# Patient Record
Sex: Female | Born: 1950 | Race: White | Hispanic: No | Marital: Single | Smoking: Current every day smoker
Health system: Southern US, Community
[De-identification: ages and names within clinical notes are randomized; demographics above are authoritative.]

## PROBLEM LIST (undated history)

## (undated) DIAGNOSIS — R9439 Abnormal result of other cardiovascular function study: Secondary | ICD-10-CM

## (undated) DIAGNOSIS — F419 Anxiety disorder, unspecified: Secondary | ICD-10-CM

## (undated) DIAGNOSIS — F329 Major depressive disorder, single episode, unspecified: Secondary | ICD-10-CM

## (undated) DIAGNOSIS — I82409 Acute embolism and thrombosis of unspecified deep veins of unspecified lower extremity: Secondary | ICD-10-CM

## (undated) DIAGNOSIS — K219 Gastro-esophageal reflux disease without esophagitis: Secondary | ICD-10-CM

## (undated) DIAGNOSIS — F32A Depression, unspecified: Secondary | ICD-10-CM

## (undated) DIAGNOSIS — M1990 Unspecified osteoarthritis, unspecified site: Secondary | ICD-10-CM

## (undated) DIAGNOSIS — T7840XA Allergy, unspecified, initial encounter: Secondary | ICD-10-CM

## (undated) DIAGNOSIS — L308 Other specified dermatitis: Secondary | ICD-10-CM

## (undated) DIAGNOSIS — I10 Essential (primary) hypertension: Secondary | ICD-10-CM

## (undated) DIAGNOSIS — D689 Coagulation defect, unspecified: Secondary | ICD-10-CM

## (undated) DIAGNOSIS — E785 Hyperlipidemia, unspecified: Secondary | ICD-10-CM

## (undated) HISTORY — DX: Coagulation defect, unspecified: D68.9

## (undated) HISTORY — DX: Essential (primary) hypertension: I10

## (undated) HISTORY — DX: Unspecified osteoarthritis, unspecified site: M19.90

## (undated) HISTORY — PX: TONSILLECTOMY: SUR1361

## (undated) HISTORY — DX: Major depressive disorder, single episode, unspecified: F32.9

## (undated) HISTORY — DX: Abnormal result of other cardiovascular function study: R94.39

## (undated) HISTORY — DX: Hyperlipidemia, unspecified: E78.5

## (undated) HISTORY — DX: Depression, unspecified: F32.A

## (undated) HISTORY — DX: Allergy, unspecified, initial encounter: T78.40XA

## (undated) HISTORY — DX: Anxiety disorder, unspecified: F41.9

## (undated) HISTORY — DX: Gastro-esophageal reflux disease without esophagitis: K21.9

## (undated) HISTORY — PX: TUBAL LIGATION: SHX77

## (undated) HISTORY — PX: NOSE SURGERY: SHX723

---

## 1998-08-21 ENCOUNTER — Other Ambulatory Visit: Admission: RE | Admit: 1998-08-21 | Discharge: 1998-08-21 | Payer: Self-pay | Admitting: Obstetrics and Gynecology

## 1998-09-17 ENCOUNTER — Other Ambulatory Visit: Admission: RE | Admit: 1998-09-17 | Discharge: 1998-09-17 | Payer: Self-pay | Admitting: Radiology

## 1999-08-03 ENCOUNTER — Encounter: Payer: Self-pay | Admitting: Orthopedic Surgery

## 1999-08-03 ENCOUNTER — Encounter: Admission: RE | Admit: 1999-08-03 | Discharge: 1999-08-03 | Payer: Self-pay | Admitting: Orthopedic Surgery

## 1999-11-30 ENCOUNTER — Other Ambulatory Visit: Admission: RE | Admit: 1999-11-30 | Discharge: 1999-11-30 | Payer: Self-pay | Admitting: Obstetrics and Gynecology

## 2000-01-18 ENCOUNTER — Encounter: Payer: Self-pay | Admitting: Family Medicine

## 2000-01-18 ENCOUNTER — Encounter: Admission: RE | Admit: 2000-01-18 | Discharge: 2000-01-18 | Payer: Self-pay | Admitting: Family Medicine

## 2000-02-03 ENCOUNTER — Encounter: Payer: Self-pay | Admitting: Family Medicine

## 2000-02-03 ENCOUNTER — Ambulatory Visit (HOSPITAL_COMMUNITY): Admission: RE | Admit: 2000-02-03 | Discharge: 2000-02-03 | Payer: Self-pay | Admitting: Family Medicine

## 2001-03-06 ENCOUNTER — Other Ambulatory Visit: Admission: RE | Admit: 2001-03-06 | Discharge: 2001-03-06 | Payer: Self-pay | Admitting: Obstetrics and Gynecology

## 2002-04-03 ENCOUNTER — Other Ambulatory Visit: Admission: RE | Admit: 2002-04-03 | Discharge: 2002-04-03 | Payer: Self-pay | Admitting: Obstetrics and Gynecology

## 2003-05-03 ENCOUNTER — Ambulatory Visit (HOSPITAL_COMMUNITY): Admission: RE | Admit: 2003-05-03 | Discharge: 2003-05-03 | Payer: Self-pay | Admitting: Obstetrics and Gynecology

## 2003-06-10 ENCOUNTER — Other Ambulatory Visit: Admission: RE | Admit: 2003-06-10 | Discharge: 2003-06-10 | Payer: Self-pay | Admitting: Obstetrics and Gynecology

## 2005-11-02 ENCOUNTER — Ambulatory Visit: Payer: Self-pay | Admitting: Internal Medicine

## 2006-11-30 ENCOUNTER — Emergency Department (HOSPITAL_COMMUNITY): Admission: EM | Admit: 2006-11-30 | Discharge: 2006-11-30 | Payer: Self-pay | Admitting: Family Medicine

## 2006-12-30 ENCOUNTER — Encounter: Admission: RE | Admit: 2006-12-30 | Discharge: 2006-12-30 | Payer: Self-pay | Admitting: Otolaryngology

## 2008-04-17 ENCOUNTER — Ambulatory Visit: Payer: Self-pay | Admitting: Family Medicine

## 2008-04-17 DIAGNOSIS — J45909 Unspecified asthma, uncomplicated: Secondary | ICD-10-CM | POA: Insufficient documentation

## 2008-04-17 DIAGNOSIS — F329 Major depressive disorder, single episode, unspecified: Secondary | ICD-10-CM | POA: Insufficient documentation

## 2008-04-17 DIAGNOSIS — M4120 Other idiopathic scoliosis, site unspecified: Secondary | ICD-10-CM | POA: Insufficient documentation

## 2008-04-17 DIAGNOSIS — L259 Unspecified contact dermatitis, unspecified cause: Secondary | ICD-10-CM

## 2008-04-17 DIAGNOSIS — E7800 Pure hypercholesterolemia, unspecified: Secondary | ICD-10-CM

## 2008-04-17 DIAGNOSIS — F419 Anxiety disorder, unspecified: Secondary | ICD-10-CM | POA: Insufficient documentation

## 2008-04-17 DIAGNOSIS — Z860100 Personal history of colon polyps, unspecified: Secondary | ICD-10-CM | POA: Insufficient documentation

## 2008-04-19 ENCOUNTER — Encounter (INDEPENDENT_AMBULATORY_CARE_PROVIDER_SITE_OTHER): Payer: Self-pay | Admitting: Family Medicine

## 2008-04-19 LAB — CONVERTED CEMR LAB
ALT: 19 units/L (ref 0–35)
AST: 18 units/L (ref 0–37)
Albumin: 4.5 g/dL (ref 3.5–5.2)
Alkaline Phosphatase: 77 units/L (ref 39–117)
BUN: 17 mg/dL (ref 6–23)
Basophils Absolute: 0 10*3/uL (ref 0.0–0.1)
Basophils Relative: 0 % (ref 0–1)
Calcium: 9.4 mg/dL (ref 8.4–10.5)
Chloride: 107 meq/L (ref 96–112)
Eosinophils Absolute: 0.3 10*3/uL (ref 0.0–0.7)
HDL: 70 mg/dL (ref >39)
LDL Cholesterol: 188 mg/dL — ABNORMAL HIGH (ref 0–99)
MCHC: 32 g/dL (ref 30.0–36.0)
MCV: 95.2 fL (ref 78.0–100.0)
Monocytes Relative: 11 % (ref 3–12)
Neutro Abs: 4.5 10*3/uL (ref 1.7–7.7)
Neutrophils Relative %: 59 % (ref 43–77)
Platelets: 296 10*3/uL (ref 150–400)
Potassium: 4.4 meq/L (ref 3.5–5.3)
RBC: 4.99 M/uL (ref 3.87–5.11)
Sodium: 143 meq/L (ref 135–145)
TSH: 2.373 microintl units/mL (ref 0.350–4.50)
Total Protein: 7 g/dL (ref 6.0–8.3)

## 2008-05-06 ENCOUNTER — Telehealth (INDEPENDENT_AMBULATORY_CARE_PROVIDER_SITE_OTHER): Payer: Self-pay | Admitting: Family Medicine

## 2008-05-06 ENCOUNTER — Ambulatory Visit: Payer: Self-pay | Admitting: Family Medicine

## 2008-05-07 ENCOUNTER — Encounter (INDEPENDENT_AMBULATORY_CARE_PROVIDER_SITE_OTHER): Payer: Self-pay | Admitting: Family Medicine

## 2008-05-07 ENCOUNTER — Ambulatory Visit: Payer: Self-pay | Admitting: *Deleted

## 2008-05-17 ENCOUNTER — Ambulatory Visit (HOSPITAL_COMMUNITY): Admission: RE | Admit: 2008-05-17 | Discharge: 2008-05-17 | Payer: Self-pay | Admitting: Family Medicine

## 2008-10-01 ENCOUNTER — Ambulatory Visit: Payer: Self-pay | Admitting: Family Medicine

## 2008-10-01 ENCOUNTER — Encounter (INDEPENDENT_AMBULATORY_CARE_PROVIDER_SITE_OTHER): Payer: Self-pay | Admitting: Family Medicine

## 2008-10-01 DIAGNOSIS — N840 Polyp of corpus uteri: Secondary | ICD-10-CM | POA: Insufficient documentation

## 2008-10-01 LAB — CONVERTED CEMR LAB
Blood in Urine, dipstick: NEGATIVE
Glucose, Urine, Semiquant: NEGATIVE
Ketones, urine, test strip: NEGATIVE
Nitrite: NEGATIVE
pH: 7

## 2008-10-10 ENCOUNTER — Encounter (INDEPENDENT_AMBULATORY_CARE_PROVIDER_SITE_OTHER): Payer: Self-pay | Admitting: Family Medicine

## 2008-11-21 ENCOUNTER — Ambulatory Visit (HOSPITAL_COMMUNITY): Admission: RE | Admit: 2008-11-21 | Discharge: 2008-11-21 | Payer: Self-pay | Admitting: Gastroenterology

## 2009-01-03 ENCOUNTER — Ambulatory Visit: Payer: Self-pay | Admitting: Physician Assistant

## 2009-02-10 ENCOUNTER — Ambulatory Visit: Payer: Self-pay | Admitting: Physician Assistant

## 2009-02-10 DIAGNOSIS — R7303 Prediabetes: Secondary | ICD-10-CM

## 2009-02-10 DIAGNOSIS — R110 Nausea: Secondary | ICD-10-CM

## 2009-02-10 LAB — CONVERTED CEMR LAB
Blood Glucose, Fingerstick: 145
Hgb A1c MFr Bld: 6.1 %

## 2009-02-18 ENCOUNTER — Ambulatory Visit: Payer: Self-pay | Admitting: Physician Assistant

## 2009-02-21 ENCOUNTER — Ambulatory Visit: Payer: Self-pay | Admitting: Obstetrics & Gynecology

## 2009-02-21 ENCOUNTER — Other Ambulatory Visit: Admission: RE | Admit: 2009-02-21 | Discharge: 2009-02-21 | Payer: Self-pay | Admitting: Obstetrics & Gynecology

## 2009-02-24 LAB — CONVERTED CEMR LAB
ALT: 16 units/L (ref 0–35)
Alkaline Phosphatase: 81 units/L (ref 39–117)
Basophils Absolute: 0 10*3/uL (ref 0.0–0.1)
Basophils Relative: 0 % (ref 0–1)
LDL Cholesterol: 135 mg/dL — ABNORMAL HIGH (ref 0–99)
Lymphocytes Relative: 29 % (ref 12–46)
MCHC: 32.1 g/dL (ref 30.0–36.0)
Microalb, Ur: 0.9 mg/dL (ref 0.00–1.89)
Monocytes Relative: 10 % (ref 3–12)
Neutro Abs: 3.6 10*3/uL (ref 1.7–7.7)
Neutrophils Relative %: 57 % (ref 43–77)
Potassium: 4.5 meq/L (ref 3.5–5.3)
RBC: 4.65 M/uL (ref 3.87–5.11)
RDW: 14.3 % (ref 11.5–15.5)
Sodium: 142 meq/L (ref 135–145)
Total Bilirubin: 0.6 mg/dL (ref 0.3–1.2)
Total Protein: 6.3 g/dL (ref 6.0–8.3)
VLDL: 10 mg/dL (ref 0–40)

## 2009-02-25 ENCOUNTER — Ambulatory Visit: Payer: Self-pay | Admitting: Physician Assistant

## 2009-02-27 ENCOUNTER — Encounter: Payer: Self-pay | Admitting: Physician Assistant

## 2009-03-04 ENCOUNTER — Ambulatory Visit: Payer: Self-pay | Admitting: Physician Assistant

## 2009-03-18 ENCOUNTER — Ambulatory Visit: Payer: Self-pay | Admitting: Physician Assistant

## 2009-04-03 ENCOUNTER — Encounter: Payer: Self-pay | Admitting: Physician Assistant

## 2009-04-03 DIAGNOSIS — N72 Inflammatory disease of cervix uteri: Secondary | ICD-10-CM

## 2009-04-29 ENCOUNTER — Ambulatory Visit: Payer: Self-pay | Admitting: Physician Assistant

## 2009-05-01 ENCOUNTER — Telehealth: Payer: Self-pay | Admitting: Physician Assistant

## 2009-05-19 ENCOUNTER — Ambulatory Visit (HOSPITAL_COMMUNITY): Admission: RE | Admit: 2009-05-19 | Discharge: 2009-05-19 | Payer: Self-pay | Admitting: Internal Medicine

## 2009-05-19 ENCOUNTER — Encounter: Payer: Self-pay | Admitting: Physician Assistant

## 2009-07-07 ENCOUNTER — Ambulatory Visit: Payer: Self-pay | Admitting: Physician Assistant

## 2009-07-21 ENCOUNTER — Ambulatory Visit: Payer: Self-pay | Admitting: Physician Assistant

## 2009-07-28 ENCOUNTER — Ambulatory Visit: Payer: Self-pay | Admitting: Physician Assistant

## 2009-07-28 LAB — CONVERTED CEMR LAB
Blood Glucose, Fingerstick: 92
Cholesterol, target level: 200 mg/dL

## 2009-07-29 ENCOUNTER — Encounter: Payer: Self-pay | Admitting: Physician Assistant

## 2009-07-29 ENCOUNTER — Ambulatory Visit: Payer: Self-pay | Admitting: Internal Medicine

## 2009-07-30 ENCOUNTER — Ambulatory Visit: Payer: Self-pay | Admitting: Internal Medicine

## 2009-07-30 ENCOUNTER — Encounter: Payer: Self-pay | Admitting: Physician Assistant

## 2009-08-05 LAB — CONVERTED CEMR LAB
BUN: 16 mg/dL (ref 6–23)
CO2: 30 meq/L (ref 19–32)
Cholesterol: 223 mg/dL — ABNORMAL HIGH (ref 0–200)
Creatinine, Ser: 0.78 mg/dL (ref 0.40–1.20)
Glucose, Bld: 88 mg/dL (ref 70–99)
Total Bilirubin: 0.4 mg/dL (ref 0.3–1.2)
Total Protein: 6.5 g/dL (ref 6.0–8.3)
Triglycerides: 71 mg/dL (ref <150)
VLDL: 14 mg/dL (ref 0–40)

## 2009-08-12 ENCOUNTER — Ambulatory Visit: Payer: Self-pay | Admitting: Internal Medicine

## 2009-08-12 ENCOUNTER — Encounter: Payer: Self-pay | Admitting: Physician Assistant

## 2009-08-13 ENCOUNTER — Encounter (INDEPENDENT_AMBULATORY_CARE_PROVIDER_SITE_OTHER): Payer: Self-pay | Admitting: Internal Medicine

## 2009-08-15 ENCOUNTER — Ambulatory Visit: Payer: Self-pay | Admitting: Physician Assistant

## 2009-08-15 ENCOUNTER — Telehealth: Payer: Self-pay | Admitting: Physician Assistant

## 2009-08-18 ENCOUNTER — Ambulatory Visit: Payer: Self-pay | Admitting: Physician Assistant

## 2009-09-02 ENCOUNTER — Ambulatory Visit: Payer: Self-pay | Admitting: Internal Medicine

## 2009-09-03 ENCOUNTER — Encounter (INDEPENDENT_AMBULATORY_CARE_PROVIDER_SITE_OTHER): Payer: Self-pay | Admitting: Internal Medicine

## 2009-09-03 LAB — CONVERTED CEMR LAB
AST: 18 units/L (ref 0–37)
BUN: 17 mg/dL (ref 6–23)
Calcium: 9.7 mg/dL (ref 8.4–10.5)
Chloride: 106 meq/L (ref 96–112)
Creatinine, Ser: 0.82 mg/dL (ref 0.40–1.20)

## 2009-09-09 ENCOUNTER — Encounter: Payer: Self-pay | Admitting: Physician Assistant

## 2009-09-09 DIAGNOSIS — L308 Other specified dermatitis: Secondary | ICD-10-CM

## 2009-09-17 ENCOUNTER — Telehealth (INDEPENDENT_AMBULATORY_CARE_PROVIDER_SITE_OTHER): Payer: Self-pay | Admitting: *Deleted

## 2009-09-24 ENCOUNTER — Ambulatory Visit: Payer: Self-pay | Admitting: Physician Assistant

## 2009-09-25 ENCOUNTER — Encounter: Payer: Self-pay | Admitting: Physician Assistant

## 2009-09-25 LAB — CONVERTED CEMR LAB
Eosinophils Relative: 3 % (ref 0–5)
HCT: 47.4 % — ABNORMAL HIGH (ref 36.0–46.0)
Hemoglobin: 14.9 g/dL (ref 12.0–15.0)
Lymphocytes Relative: 36 % (ref 12–46)
Lymphs Abs: 2.4 10*3/uL (ref 0.7–4.0)
Monocytes Absolute: 0.4 10*3/uL (ref 0.1–1.0)
RDW: 14.7 % (ref 11.5–15.5)
WBC: 6.7 10*3/uL (ref 4.0–10.5)

## 2009-10-20 ENCOUNTER — Encounter: Payer: Self-pay | Admitting: Physician Assistant

## 2009-11-05 ENCOUNTER — Telehealth: Payer: Self-pay | Admitting: Physician Assistant

## 2009-11-18 ENCOUNTER — Ambulatory Visit: Payer: Self-pay | Admitting: Physician Assistant

## 2009-11-18 ENCOUNTER — Encounter: Payer: Self-pay | Admitting: Physician Assistant

## 2009-11-18 DIAGNOSIS — R030 Elevated blood-pressure reading, without diagnosis of hypertension: Secondary | ICD-10-CM

## 2009-11-18 LAB — CONVERTED CEMR LAB: Hgb A1c MFr Bld: 6.4 % — ABNORMAL HIGH (ref <5.7)

## 2009-11-19 ENCOUNTER — Ambulatory Visit: Payer: Self-pay | Admitting: Physician Assistant

## 2009-11-19 ENCOUNTER — Telehealth: Payer: Self-pay | Admitting: Physician Assistant

## 2009-11-21 ENCOUNTER — Telehealth: Payer: Self-pay | Admitting: Physician Assistant

## 2009-11-21 ENCOUNTER — Encounter: Payer: Self-pay | Admitting: Physician Assistant

## 2009-11-24 ENCOUNTER — Encounter: Payer: Self-pay | Admitting: Physician Assistant

## 2009-11-26 ENCOUNTER — Ambulatory Visit: Payer: Self-pay | Admitting: Physician Assistant

## 2009-11-27 LAB — CONVERTED CEMR LAB
ALT: 15 units/L (ref 0–35)
CO2: 27 meq/L (ref 19–32)
Calcium: 9.7 mg/dL (ref 8.4–10.5)
Chloride: 108 meq/L (ref 96–112)
Cholesterol: 192 mg/dL (ref 0–200)
HDL goal, serum: 40 mg/dL
LDL Goal: 100 mg/dL
Potassium: 5.6 meq/L — ABNORMAL HIGH (ref 3.5–5.3)
Sodium: 144 meq/L (ref 135–145)
Total Protein: 6.7 g/dL (ref 6.0–8.3)

## 2009-11-28 ENCOUNTER — Encounter: Payer: Self-pay | Admitting: Physician Assistant

## 2009-12-02 ENCOUNTER — Ambulatory Visit: Payer: Self-pay | Admitting: Physician Assistant

## 2009-12-02 LAB — CONVERTED CEMR LAB
Chloride: 107 meq/L (ref 96–112)
Creatinine, Ser: 0.77 mg/dL (ref 0.40–1.20)
Potassium: 4 meq/L (ref 3.5–5.3)

## 2009-12-03 ENCOUNTER — Ambulatory Visit: Payer: Self-pay | Admitting: Physician Assistant

## 2009-12-16 ENCOUNTER — Encounter (INDEPENDENT_AMBULATORY_CARE_PROVIDER_SITE_OTHER): Payer: Self-pay | Admitting: Internal Medicine

## 2009-12-16 ENCOUNTER — Ambulatory Visit: Payer: Self-pay | Admitting: Internal Medicine

## 2009-12-17 ENCOUNTER — Ambulatory Visit: Payer: Self-pay | Admitting: Physician Assistant

## 2009-12-19 ENCOUNTER — Encounter: Payer: Self-pay | Admitting: Physician Assistant

## 2009-12-31 ENCOUNTER — Ambulatory Visit: Payer: Self-pay | Admitting: Physician Assistant

## 2010-01-07 ENCOUNTER — Encounter: Payer: Self-pay | Admitting: Physician Assistant

## 2010-01-09 ENCOUNTER — Encounter: Payer: Self-pay | Admitting: Physician Assistant

## 2010-01-14 ENCOUNTER — Ambulatory Visit: Payer: Self-pay | Admitting: Physician Assistant

## 2010-01-15 ENCOUNTER — Telehealth: Payer: Self-pay | Admitting: Physician Assistant

## 2010-01-16 ENCOUNTER — Encounter: Payer: Self-pay | Admitting: Physician Assistant

## 2010-01-28 ENCOUNTER — Ambulatory Visit: Payer: Self-pay | Admitting: Nurse Practitioner

## 2010-02-17 ENCOUNTER — Encounter (INDEPENDENT_AMBULATORY_CARE_PROVIDER_SITE_OTHER): Payer: Self-pay | Admitting: Internal Medicine

## 2010-02-17 ENCOUNTER — Encounter: Payer: Self-pay | Admitting: Physician Assistant

## 2010-02-20 ENCOUNTER — Telehealth: Payer: Self-pay | Admitting: Physician Assistant

## 2010-03-17 ENCOUNTER — Encounter (INDEPENDENT_AMBULATORY_CARE_PROVIDER_SITE_OTHER): Payer: Self-pay | Admitting: Internal Medicine

## 2010-04-08 ENCOUNTER — Telehealth (INDEPENDENT_AMBULATORY_CARE_PROVIDER_SITE_OTHER): Payer: Self-pay | Admitting: Nurse Practitioner

## 2010-04-22 ENCOUNTER — Telehealth (INDEPENDENT_AMBULATORY_CARE_PROVIDER_SITE_OTHER): Payer: Self-pay | Admitting: *Deleted

## 2010-05-19 ENCOUNTER — Encounter (INDEPENDENT_AMBULATORY_CARE_PROVIDER_SITE_OTHER): Payer: Self-pay | Admitting: Internal Medicine

## 2010-05-20 ENCOUNTER — Ambulatory Visit (HOSPITAL_COMMUNITY)
Admission: RE | Admit: 2010-05-20 | Discharge: 2010-05-20 | Payer: Self-pay | Source: Home / Self Care | Attending: Internal Medicine | Admitting: Internal Medicine

## 2010-05-28 ENCOUNTER — Ambulatory Visit: Payer: Self-pay | Admitting: Obstetrics & Gynecology

## 2010-06-16 ENCOUNTER — Encounter: Payer: Self-pay | Admitting: Physician Assistant

## 2010-06-24 ENCOUNTER — Ambulatory Visit
Admission: RE | Admit: 2010-06-24 | Discharge: 2010-06-24 | Payer: Self-pay | Source: Home / Self Care | Attending: Internal Medicine | Admitting: Internal Medicine

## 2010-06-25 ENCOUNTER — Ambulatory Visit
Admission: RE | Admit: 2010-06-25 | Discharge: 2010-06-25 | Payer: Self-pay | Source: Home / Self Care | Attending: Internal Medicine | Admitting: Internal Medicine

## 2010-06-25 ENCOUNTER — Encounter (INDEPENDENT_AMBULATORY_CARE_PROVIDER_SITE_OTHER): Payer: Self-pay | Admitting: Nurse Practitioner

## 2010-06-30 NOTE — Letter (Signed)
Summary: [facility]  [facility]   Imported By: Arta Bruce 12/02/2009 12:20:45  _____________________________________________________________________  External Attachment:    Type:   Image     Comment:   External Document

## 2010-06-30 NOTE — Letter (Signed)
Summary: [facility]   Imported By: Arta Bruce 02/26/2010 15:38:08  _____________________________________________________________________  External Attachment:    Type:   Image     Comment:   External Document

## 2010-06-30 NOTE — Assessment & Plan Note (Signed)
Summary: HAND CRACKED OPEN///KT   Vital Signs:  Patient profile:   60 year old female Height:      61 inches Weight:      162 pounds BMI:     30.72 Temp:     97.9 degrees F oral Pulse rate:   85 / minute Pulse rhythm:   regular Resp:     18 per minute BP sitting:   153 / 85  (left arm) Cuff size:   regular  Vitals Entered By: [name] (July 21, 2009 10:44 AM) CC: pt's hands and feet is cracking that her body is bleeding.... Is Patient Diabetic? No Pain Assessment Patient in pain? no       Does patient need assistance? Functional Status Self care Ambulation Normal   Primary Care Provider:  Tereso Newcomer PA-C  CC:  pt's hands and feet is cracking that her body is bleeding.....  History of Present Illness: Here for dishydrotic eczema. Has had this problem for a long time.  Has seen many dermatologists in the past. She says she has tried multiple therapies.  She does not use any daily topical medications. She found alitretinoin online and wants to try that.  In reviewing this med, it is approved for use in [place] only.  WebMD notes it is now being used for eczema.  I cannot find this on Uptodate. Patient notes she typically has some cracked skin on her hands and feet. It started getting worse on her right hand around Thanksgiving.  She now has open wounds on the palm of her hand.  She is wearing gloves and only putting neosporin on it consistently.  She has tried not to use hydrocortisone cream but has tried it some and is using some hand cream and putting vitamin E oil.   Problems Prior to Update: 1)  Nabothian Cyst  (ICD-616.0) 2)  Nausea Alone  (ICD-787.02) 3)  Hyperglycemia  (ICD-790.29) 4)  Screening For Malignant Neoplasm, Cervix  (ICD-V76.2) 5)  Examination, Routine Medical  (ICD-V70.0) 6)  Endometrial Polyp  (ICD-621.0) 7)  Eczema, Hands  (ICD-692.9) 8)  Hyperlipidemia  (ICD-272.4) 9)  Colonic Polyps, Hx of  (ICD-V12.72) 10)  Scoliosis   (ICD-737.30) 11)  Asthma  (ICD-493.90) 12)  Eczema  (ICD-692.9) 13)  Depression  (ICD-311) 14)  Screening For Mlig Neop, Breast, Nos  (ICD-V76.10)  Current Medications (verified): 1)  Lexapro 20 Mg Tabs (Escitalopram Oxalate) .[name].. 1 By Mouth Once Daily(Dr.parrish Does Icp) 2)  Proair Hfa 108 (90 Base) Mcg/act Aers (Albuterol Sulfate) .... 2 Puffs Every 4-6 Hours As Needed For Wheezing/sob 3)  Blood Glucose Meter  Kit (Blood Glucose Monitoring Suppl) .... As Directed 4)  Sidekick Blood Glucose System  Devi (Blood Gluc Meter Disp-Strips) .... As Directed 5)  Lancets  Misc (Lancets) .... As Directed 6)  Aspir-Low 81 Mg Tbec (Aspirin) .... Once Daily  Allergies (verified): 1)  ! * Statins 2)  ! Percodan  Past History:  Past Medical History: Last updated: 05/06/2008 Depression...sees Dr.Parrish 1x per year...Dr.Parrish does her ICP.Has  had depression since college. Current Problems:  HYPERLIPIDEMIA (ICD-272.4) COLONIC POLYPS, HX OF (ICD-V12.72) SCOLIOSIS (ICD-737.30) ASTHMA (ICD-493.90) ECZEMA (ICD-692.9) DEPRESSION (ICD-311)  Risk Factors: Smoking Status: current (04/29/2009) Packs/Day: 1.0 (04/29/2009)  Review of Systems      See HPI General:  Denies chills, fever, and sweats. CV:  Denies chest pain or discomfort and fainting. GI:  Denies diarrhea, nausea, and vomiting.  Physical Exam  General:  alert, well-developed, and well-nourished.  Head:  normocephalic and atraumatic.   Lungs:  normal breath sounds.   Heart:  normal rate and regular rhythm.   Neurologic:  alert & oriented X3 and cranial nerves II-XII intact.   Skin:  right hand with mod edema  widespread cracking and drying noted open wounds noted no erythema or discharge  cracking and drying of bilat feet also noted no open wounds  Psych:  normally interactive.     Impression & Recommendations:  Problem # 1:  ECZEMA (ICD-692.9)  severe discussed case with Dr. Reche Dixon will start her on antibiotics  to cover for secondary infection will also put her on prednisone taper start hydrocortisone 2.5% cream two times a day  has f/u next week . . . will f/u then  Her updated medication list for this problem includes:    Prednisone 10 Mg Tabs (Prednisone) .[name]... Take 6 tabs tues. take 5 tabs wed. take 4 tabs thurs. take 3 tabs fri take 2 tabs sat take 1 tab sun. and stop    Hydrocortisone 2.5 % Crea (Hydrocortisone) .[name]... Apply to hands and feet  two times a day  Complete Medication List: 1)  Lexapro 20 Mg Tabs (Escitalopram oxalate) .[name].. 1 by mouth once daily(dr.parrish does icp) 2)  Proair Hfa 108 (90 Base) Mcg/act Aers (Albuterol sulfate) .... 2 puffs every 4-6 hours as needed for wheezing/sob 3)  Blood Glucose Meter Kit (Blood glucose monitoring suppl) .... As directed 4)  Sidekick Blood Glucose System Devi (Blood gluc meter disp-strips) .... As directed 5)  Lancets Misc (Lancets) .... As directed 6)  Aspir-low 81 Mg Tbec (Aspirin) .... Once daily 7)  Prednisone 10 Mg Tabs (Prednisone) .... Take 6 tabs tues. take 5 tabs wed. take 4 tabs thurs. take 3 tabs fri take 2 tabs sat take 1 tab sun. and stop 8)  Hydrocortisone 2.5 % Crea (Hydrocortisone) .... Apply to hands and feet  two times a day 9)  Keflex 500 Mg Caps (Cephalexin) .... Take 1 tablet by mouth three times a day until finished  Other Orders: Dermatology Referral (Derma) Depo- Medrol 80mg  (J1040) Admin of Therapeutic Inj  intramuscular or subcutaneous (74259)  Patient Instructions: 1)  DepoMedrol injection 80 mg. 2)  Use cream two times a day until follow up. 3)  Take prednisone and antibiotics until all gone. 4)  Keep follow up appointment next week. 5)  Return sooner if needed. Prescriptions: KEFLEX 500 MG CAPS (CEPHALEXIN) Take 1 tablet by mouth three times a day until finished  #21 x 0   Entered and Authorized by:   Tereso Newcomer PA-C   Signed by:   Tereso Newcomer PA-C on 07/21/2009   Method used:   Print then Give to  Patient   RxID:   [id number] HYDROCORTISONE 2.5 % CREA (HYDROCORTISONE) Apply to hands and feet  two times a day  #45 grams x 2   Entered and Authorized by:   Tereso Newcomer PA-C   Signed by:   Tereso Newcomer PA-C on 07/21/2009   Method used:   Print then Give to Patient   RxID:   [id number] PREDNISONE 10 MG TABS (PREDNISONE) Take 6 tabs Tues. Take 5 tabs Wed. Take 4 tabs Thurs. Take 3 tabs Fri Take 2 tabs Sat Take 1 tab Sun. and stop  #21 x 0   Entered and Authorized by:   Tereso Newcomer PA-C   Signed by:   Tereso Newcomer PA-C on 07/21/2009   Method used:   Print then Give to  Patient   RxID:   [id number]      Medication Administration  Injection # 1:    Medication: Depo- Medrol 80mg     Diagnosis: ECZEMA, HANDS (ICD-692.9)    Route: IM    Site: RUOQ gluteus    Exp Date: 08/2010    Lot #: Sherral Hammers    Mfr: Pharmacia    Comments: [id number]    Patient tolerated injection without complications    Given by: [name] (July 21, 2009 12:33 PM)  Orders Added: 1)  Est. Patient Level IV [88416] 2)  Dermatology Referral [Derma] 3)  Depo- Medrol 80mg  [J1040] 4)  Admin of Therapeutic Inj  intramuscular or subcutaneous [60630]

## 2010-06-30 NOTE — Progress Notes (Signed)
  Phone Note Outgoing Call   Summary of Call: Patient saw derm at [facility]. Had bx done. Make sure she has a f/u visit with derm after her bx results come back. Initial call taken by: Brynda Rim,  August 15, 2009 3:12 PM  Follow-up for Phone Call        graceila can you schedule this appt Follow-up by: [name],  August 15, 2009 4:50 PM  Additional Follow-up for Phone Call Additional follow up Details #1::        left a message to the pt to call back.Manon Hilding  August 18, 2009 8:57 AM    Additional Follow-up for Phone Call Additional follow up Details #2::    PATIENT IS ALAREADY SCHEDULED TO GO BACK FOR HER DERM FOLLOW UP APPT. IN APRIL. Follow-up by: Leodis Rains,  August 18, 2009 11:31 AM

## 2010-06-30 NOTE — Letter (Signed)
Summary: *HSN Results Follow up  [facility]  [address] [place], [place] [address]   Phone: [phone]  Fax: [phone]      12/03/2009   Tasha [name] [place] [address] [place], [place]  [address]   Dear  Ms. Tasha [name],                            ____S.Drinkard,FNP   ____D. Gore,FNP       ____B. McPherson,MD   ____V. Rankins,MD    ____E. Mulberry,MD    ____N. Daphine Deutscher, FNP  ____D. Reche Dixon, MD    ____K. Philipp Deputy, MD    __x__S. Alben Spittle, PA-C     This letter is to inform you that your recent test(s):  _______Pap Smear    ___x____Lab Test     _______X-ray    ____x___ is within acceptable limits  _______ requires a medication change  _______ requires a follow-up lab visit  _______ requires a follow-up visit with your provider   Comments: Repeat potassium normal.  Original must have been lab error.       _________________________________________________________ If you have any questions, please contact our office                     Sincerely,  Tereso Newcomer PA-C [facility]

## 2010-06-30 NOTE — Letter (Signed)
Summary: *HSN Results Follow up  [facility]  [address] [place], [place] [address]   Phone: [phone]  Fax: [phone]      10/20/2009   Tasha [name] [place] [address] [place], [place]  [address]   Dear  [name],                            ____S.Drinkard,FNP   ____D. Gore,FNP       ____B. McPherson,MD   ____V. Rankins,MD    ____E. Mulberry,MD    ____N. Daphine Deutscher, FNP  ____D. Reche Dixon, MD    ____K. Philipp Deputy, MD    __x__S. Alben Spittle, PA-C     This letter is to inform you that your recent test(s):  _______Pap Smear    _______Lab Test     _______X-ray    _______ is within acceptable limits  _______ requires a medication change  _______ requires a follow-up lab visit  _______ requires a follow-up visit with your provider   Comments: Your eye test done in March was normal.       _________________________________________________________ If you have any questions, please contact our office                     Sincerely,  Tereso Newcomer PA-C [facility]

## 2010-06-30 NOTE — Progress Notes (Signed)
Summary: REF FOR HER MAMOGRAM & PAP  Phone Note Call from Patient Call back at Home Phone [phone]   Reason for Call: Referral Summary of Call: [facility] pt. MS Cirelli CALLED TO SEE IF WE CAN MAKE HER YEARLY MAMOGRAM APPT. HER LAST MAMOGRAM WAS MARCH OF LAST YEAR, AND ALSO HER PAP, WHICH WAS DONE 05/19/09 BY DR Mar[facility] Potter [facility].. MS Sabado SAYS THAT SCOTT  TOLD HER THAT JUST WAIT TILL DECEMBER TO GET THESE THINGS DONE. Initial call taken by: Domenic Polite,  April 08, 2010 4:43 PM  Follow-up for Phone Call        Left message on answer machine for pt to return call.. [name] April 09, 2010  Additional Follow-up for Phone Call Additional follow up Details #1::        MS Kaner [facility] AND [facility]GRAM TO BE AT [facility]. Additional Follow-up by: Leodis Rains,  April 09, 2010 12:33 PM  New Problems: UNSPECIFIED BREAST SCREENING (ICD-V76.10)   Additional Follow-up for Phone Call Additional follow up Details #2::    spoke with pt and she says [facility] and [facility] for that and she wants me to make her appt for a mammogram... [name]  April 13, 2010 10:34 AM   Mammogram order done. and [facility] done print both and schedule. notify pt of time/date of the appt n.martin,fnp April 13, 2010 6:11 PM    Additional Follow-up for Phone Call Additional follow up Details #3:: Details for Additional Follow-up Action Taken: sent to [facility] Additional Follow-up by: [name],  April 14, 2010 11:17 AM  New Problems: UNSPECIFIED BREAST SCREENING (ICD-V76.10)

## 2010-06-30 NOTE — Progress Notes (Signed)
Summary: Needs Labs  Phone Note Outgoing Call   Summary of Call: Recent notes from derm state she is to get soriatane started. She needs a CMET and FLP 4 weeks after starting this medicine.  Initial call taken by: Brynda Rim,  February 20, 2010 3:44 PM  Follow-up for Phone Call        Left message on answering machine for pt to call back...Marland KitchenMarland KitchenArmenia Shannon  February 23, 2010 9:29 AM   pt is aware.... [name]  February 23, 2010 12:12 PM     New/Updated Medications: SORIATANE 25 MG CAPS (ACITRETIN) Take 1 capsule by mouth once a day

## 2010-06-30 NOTE — Letter (Signed)
Summary: Podiatry  Podiatry   Imported By: Arta Bruce 09/02/2009 15:33:14  _____________________________________________________________________  External Attachment:    Type:   Image     Comment:   External Document

## 2010-06-30 NOTE — Letter (Signed)
Summary: *HSN Results Follow up  [facility]  [address] [place], [place] [address]   Phone: [phone]  Fax: [phone]      11/19/2009   [name] [address] [place], [place]  [address]   Dear  [name],                            ____S.Drinkard,FNP   ____D. Gore,FNP       ____B. McPherson,MD   ____V. Rankins,MD    ____E. Mulberry,MD    ____N. Daphine Deutscher, FNP  ____D. Reche Dixon, MD    ____K. Philipp Deputy, MD    __x__S. Alben Spittle, PA-C    This letter is to inform you that your recent test(s):  _______Pap Smear    _______Lab Test     _______X-ray    _______ is within acceptable limits  _______ requires a medication change  _______ requires a follow-up lab visit  _______ requires a follow-up visit with your provider   Comments: Your hemoglobin A1C is 6.4.  A reading of 6.5 or higher is diagnostic of diabetes.  We will need to keep an eye on this with periodic tests.  If you have not already and would like to see our dietician, let me know.  She does a great job and would be very helpful.       _________________________________________________________ If you have any questions, please contact our office                     Sincerely,  Tereso Newcomer PA-C [facility]

## 2010-06-30 NOTE — Letter (Signed)
Summary: MAILED REQUESTED RECORDS TO [facility]  MAILED REQUESTED RECORDS TO [facility]   Imported By: Arta Bruce 01/09/2010 10:40:50  _____________________________________________________________________  External Attachment:    Type:   Image     Comment:   External Document

## 2010-06-30 NOTE — Letter (Signed)
Summary: *HSN Results Follow up  [facility]  [address] [place], [place] [address]   Phone: [phone]  Fax: [phone]      09/25/2009   MISSEY HASLEY [place] [address] [place], [place]  [address]   Dear  [name],                            ____S.Drinkard,FNP   ____D. Gore,FNP       ____B. McPherson,MD   ____V. Rankins,MD    ____E. Mulberry,MD    ____N. Daphine Deutscher, FNP  ____D. Reche Dixon, MD    ____K. Philipp Deputy, MD    __x__S. Alben Spittle, PA-C     This letter is to inform you that your recent test(s):  _______Pap Smear    ___x____Lab Test     _______X-ray    ___x____ is within acceptable limits  _______ requires a medication change  _______ requires a follow-up lab visit  _______ requires a follow-up visit with your provider   Comments:       _________________________________________________________ If you have any questions, please contact our office                     Sincerely,  Tereso Newcomer PA-C [facility]

## 2010-06-30 NOTE — Progress Notes (Signed)
Summary: pt return your call  Phone Note Call from Patient   Summary of Call: Call from Patient Call back at Home Phone [phone]  Initial call taken by: Domenic Polite,  April 22, 2010 3:06 PM  Follow-up for Phone Call        I LVM TO PT  ABOUT HER MAMMOGRAM APPT DATE & TIME .Marland KitchenCheryll Dessert  April 22, 2010 4:26 PM

## 2010-06-30 NOTE — Letter (Signed)
Summary: [facility]  DERM CLINIC   Imported By: Arta Bruce 01/14/2010 11:01:30  _____________________________________________________________________  External Attachment:    Type:   Image     Comment:   External Document

## 2010-06-30 NOTE — Miscellaneous (Signed)
Summary: Retasure Normal  Clinical Lists Changes  Observations: Added new observation of DIAB EYE EX: Retasure Normal (10/20/2009 23:12)

## 2010-06-30 NOTE — Letter (Signed)
Summary: RETASURE  RETASURE   Imported By: Arta Bruce 10/21/2009 10:22:14  _____________________________________________________________________  External Attachment:    Type:   Image     Comment:   External Document

## 2010-06-30 NOTE — Progress Notes (Signed)
Summary: In case you get this call . . . forward to me  Phone Note Outgoing Call   Summary of Call: Left message at Dr. Sherryl Barters office to get a letter from him about why she should apply for disability.  Receptionist took message and he will likely be calling me back. Initial call taken by: Brynda Rim,  November 19, 2009 4:30 PM  Follow-up for Phone Call        DR TAFFEEN NEVER CALLED HERE TO THE OFFICE. Follow-up by: Leodis Rains,  November 25, 2009 3:51 PM

## 2010-06-30 NOTE — Letter (Signed)
Summary: DERMATOLOGY  DERMATOLOGY   Imported By: Arta Bruce 10/20/2009 16:13:57  _____________________________________________________________________  External Attachment:    Type:   Image     Comment:   External Document

## 2010-06-30 NOTE — Miscellaneous (Signed)
  Clinical Lists Changes  Medications: Added new medication of TRIAMCINOLONE ACETONIDE 0.1 % CREA (TRIAMCINOLONE ACETONIDE) apply once daily x 1 month Added new medication of HUMIRA 40 MG/0.8ML KIT (ADALIMUMAB) inject every 2 weeks

## 2010-06-30 NOTE — Letter (Signed)
Summary: NOTES FROM DR.TAFEEN/BIOPSY  NOTES FROM DR.TAFEEN/BIOPSY   Imported By: Arta Bruce 12/02/2009 12:23:23  _____________________________________________________________________  External Attachment:    Type:   Image     Comment:   External Document

## 2010-06-30 NOTE — Progress Notes (Signed)
Summary: Humira injection  Phone Note Call from Patient   Caller: Patient Summary of Call: Pt. in office requesting assistance with Humira injection. Dr. Lenn Sink office contacted TB test neg need to repeat yearly, Labs ok need to repeat Q 6 mos. Humira 40mg  Sub Q given left abd area. Pt. tolerated procedure well. Initial call taken by: Gaylyn Cheers RN,  November 05, 2009 3:46 PM

## 2010-06-30 NOTE — Letter (Signed)
Summary: MAILED REQUESTED RECORDS TO [facility]  MAILED REQUESTED RECORDS TO [facility]   Imported By: Arta Bruce 12/19/2009 12:25:57  _____________________________________________________________________  External Attachment:    Type:   Image     Comment:   External Document

## 2010-06-30 NOTE — Miscellaneous (Signed)
Summary: Methotrexate Plan   Spoke with Dr. Leta Speller (dermatology). 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 He felt that she should be on methotrexate. She has already had initial labs (CMET was normal); has not had CBC yet. He called me to specifically give me instructions regarding how to start her methotrexate.  1.  She will take MTX 2.5 mg 3 tabs by mouth q 12 hours for 3 doses per week. 2.  After one week, she is to repeat  CBC and LFTs. 3.  She will take MTX for another week, then repeat labs again. 4.  Take MTX for 2 weeks, then repeat labs again. 5.  Then check labs monthly.  He will f/u with her in derm clinic monthly.  Need to fax labs to Dr. Londell Moh at [phone]. His phone number is [phone].   Clinical Lists Changes  Problems: Added new problem of PSORIASIS (ICD-696.1) Assessed PSORIASIS as comment only - bx proven eczematous psoriasis per dermatology Dr. Leta Speller patient to start MTX see clinical update from 09/09/2009       Impression & Recommendations:  Problem # 1:  PSORIASIS (ICD-696.1) bx proven eczematous psoriasis per dermatology Dr. Leta Speller patient to start MTX see clinical update from 09/09/2009  Complete Medication List: 1)  Lexapro 20 Mg Tabs (Escitalopram oxalate) .[name].. 1 by mouth once daily(dr.parrish does icp) 2)  Proair Hfa 108 (90 Base) Mcg/act Aers (Albuterol sulfate) .... 2 puffs every 4-6 hours as needed for wheezing/sob 3)  Blood Glucose Meter Kit (Blood glucose monitoring suppl) .... As directed 4)  Sidekick Blood Glucose System Devi (Blood gluc meter disp-strips) .... As directed 5)  Lancets Misc (Lancets) .... As directed 6)  Aspir-low 81 Mg Tbec (Aspirin) .... Once daily 7)  Prednisone 10 Mg Tabs (Prednisone) .... Take 6 tabs tues. take 5 tabs wed. take 4 tabs thurs. take 3 tabs fri take 2 tabs sat take 1 tab sun. and stop 8)  Hydrocortisone 2.5 % Crea (Hydrocortisone) .... Apply to hands and  feet  two times a day 9)  Flax Seed Oil 1000 Mg Caps (Flaxseed (linseed)) .... Once daily 10)  Crestor 5 Mg Tabs (Rosuvastatin calcium) .... Take one by mouth every monday, wednesday and friday for cholesterol  Appended Document: Methotrexate Plan    Phone Note Outgoing Call   Summary of Call: Patient needs to come in for CBC. Let me talk to her to explain how we will start her on a new med. per the dermatologist for her eczema. Initial call taken by: Tereso Newcomer PA-C,  September 09, 2009 5:47 PM  Follow-up for Phone Call        Left message on answering machine for pt to call back.Marland KitchenMarland KitchenMarland KitchenArmenia [name]  September 16, 2009 8:10 AM   Additional Follow-up for Phone Call Additional follow up Details #1::        Still have not heard from her?? Additional Follow-up by: Brynda Rim,  September 16, 2009 10:27 AM    Additional Follow-up for Phone Call Additional follow up Details #2::    Left message on answering machine for pt to call back.Marland KitchenMarland KitchenMarland KitchenArmenia [name]  September 17, 2009 10:43 AM  pt called and spoke with [name]... Tasha [name]  September 17, 2009 2:08 PM   Spoke to patient and she does not want to take MTX. Left message for Dr. Londell Moh to apprise him. Arranged for Tasha [name] to have f/u in derm clinic. Follow-up by: Tereso Newcomer PA-C,  September 18, 2009 1:38 PM

## 2010-06-30 NOTE — Letter (Signed)
Summary: REQUESTED RECORDS FOR SELF  REQUESTED RECORDS FOR SELF   Imported By: Arta Bruce 11/21/2009 14:51:08  _____________________________________________________________________  External Attachment:    Type:   Image     Comment:   External Document

## 2010-06-30 NOTE — Progress Notes (Signed)
Summary: Needs refills for spiriva  Phone Note Call from Patient Call back at [phone]   Summary of Call: The pt wanted to share with the provider that the spiriva medication is helping her a lot, so she is wonderng if she can get refills [facility]).  Pt states that the samples are helping her alot. Alben Spittle PA-c Initial call taken by: Manon Hilding,  January 15, 2010 10:22 AM  Follow-up for Phone Call        Sent to S. Phinneas Shakoor.  Dutch Quint RN  January 15, 2010 10:29 AM   Additional Follow-up for Phone Call Additional follow up Details #1::        Rx for spiriva sent to [facility] pharmacy notify pt to check there Additional Follow-up by: Lehman Prom FNP,  January 15, 2010 5:07 PM    Additional Follow-up for Phone Call Additional follow up Details #2::    pt aware Follow-up by: [name],  January 16, 2010 10:56 AM  Prescriptions: SPIRIVA HANDIHALER 18 MCG CAPS (TIOTROPIUM BROMIDE MONOHYDRATE) 1 inhalation once daily  #30 x 3   Entered and Authorized by:   Lehman Prom FNP   Signed by:   Lehman Prom FNP on 01/15/2010   Method used:   Faxed to ...       [facility] (retail)       [address] [place], [place]  [address]       Ph: [phone]       Fax: [phone]   RxID:   [id number]

## 2010-06-30 NOTE — Progress Notes (Signed)
Summary: Patient refuses to take methotrexate  Phone Note Outgoing Call   Summary of Call: Spoke to patient today. She has researched methotrexate and has decided she does not want to take it. She is starting Humira now and wants to see how this works. I suggested she follow up with derm clinic to reassess and discuss methotrexate again.  [place],  Please get her on the schedule for dermatology clinic in June. Initial call taken by: Tereso Newcomer PA-C,  September 17, 2009 1:00 PM  Follow-up for Phone Call        graciela can you schedule pt appt with derma clinic in June... thanks Follow-up by: [name],  September 17, 2009 2:53 PM  Additional Follow-up for Phone Call Additional follow up Details #1::        Appointment scheduled Pt will be seen on November 18, 2009 at 5 pm [facility] location).Manon Hilding  September 18, 2009 3:27 PM

## 2010-06-30 NOTE — Letter (Signed)
Summary: DERMATOLOGY NOTES  DERMATOLOGY NOTES   Imported By: Arta Bruce 02/09/2010 14:37:03  _____________________________________________________________________  External Attachment:    Type:   Image     Comment:   External Document

## 2010-06-30 NOTE — Letter (Signed)
Summary: REFERRALDERMATOLOGY  REFERRALDERMATOLOGY   Imported By: Arta Bruce 10/20/2009 16:25:29  _____________________________________________________________________  External Attachment:    Type:   Image     Comment:   External Document

## 2010-06-30 NOTE — Letter (Signed)
Summary: Generic Letter  [facility]  [address] [place], [place] [address]   Phone: [phone]  Fax: [phone]    11/28/2009  To whom it may concern:  Tasha [name] (DOB 10-29-1950) is a patient of mine and has been diagnosed with psoriasis by a dermatologist.  This is felt to be severe form of the disease.  This has been a problem for her for many years.  She has been referred to many specialists in the past and has tried several different treatments.  She has not had much success and continues to be plagued by significant lesions on her hands and feet.  The dermatologist has placed her on a new drug recently, but, so far, she has not had much improvement.  The dermatologist has said that, her "skin problem is not life threatening, but life ruining and quite disabling."  Given her assessment and my experience with her problem in my office, I do not feel she will be able to find meaningful and consistent employment.  I think she should be considered for disability.             Sincerely,   Tereso Newcomer PA-C

## 2010-06-30 NOTE — Assessment & Plan Note (Signed)
Summary: 3 month f/u asthma, sugar, cholesterol/tmm   Vital Signs:  Patient profile:   60 year old female Height:      61 inches Weight:      154 pounds BMI:     29.20 Temp:     97.6 degrees F oral Pulse rate:   90 / minute Pulse rhythm:   regular Resp:     18 per minute BP sitting:   136 / 86  (left arm) Cuff size:   regular  Vitals Entered By: [name] (November 18, 2009 2:03 PM)  Serial Vital Signs/Assessments:  Time      Position  BP       Pulse  Resp  Temp     By 2:33 PM             144/88                         Tereso Newcomer PA-C  CC: three month f/u.... pt would like to know if we can start giving her her humira shots, Hypertension Management Is Patient Diabetic? No Pain Assessment Patient in pain? no      CBG Result 146  Does patient need assistance? Functional Status Self care Ambulation Normal   Primary Care Provider:  Tereso Newcomer PA-C  CC:  three month f/u.... pt would like to know if we can start giving her her humira shots and Hypertension Management.  History of Present Illness: Here for f/u.  Eczematous psoriasis:  Has been closely followed at [facility]. Dr. Londell Moh wanted her to be on Methotrexate, but the patient refused this.  She has been put on Humira.  Has noted improvement in her hands.  She has continued to have increased breakouts on face and arms and feet.  Using Humira q 2 weeks.  Wants to have shots adminstered here.  She does not like giving herself shots.  COPD:  Smokes 1 ppd.  Did refuse Advair in past.  She is now using Advair.  She feels like she is breathing better.  Uses ProAir several times a day.  She wheezes.  She coughs up sputum most mornings.  No fevers.  No change in sputum.  Has never tried Spiriva.  No chest pain.  No syncope.  Sleeps on one pillow.  No edema.    Glucose Intolerance:  Checks sugars.  Running low 100s.  Nothing above 120.  High Chol:  Tolerating Crestor every MWF.  No  myalgias.   Hypertension History:      Positive major cardiovascular risk factors include female age 26 years old or older, hyperlipidemia, and current tobacco user.      Problems Prior to Update: 1)  Elevated Bp Reading Without Dx Hypertension  (ICD-796.2) 2)  Psoriasis  (ICD-696.1) 3)  Nabothian Cyst  (ICD-616.0) 4)  Nausea Alone  (ICD-787.02) 5)  Hyperglycemia  (ICD-790.29) 6)  Screening For Malignant Neoplasm, Cervix  (ICD-V76.2) 7)  Examination, Routine Medical  (ICD-V70.0) 8)  Endometrial Polyp  (ICD-621.0) 9)  Eczema, Hands  (ICD-692.9) 10)  Hyperlipidemia  (ICD-272.4) 11)  Colonic Polyps, Hx of  (ICD-V12.72) 12)  Scoliosis  (ICD-737.30) 13)  Asthma  (ICD-493.90) 14)  Eczema  (ICD-692.9) 15)  Depression  (ICD-311) 16)  Screening For Mlig Neop, Breast, Nos  (ICD-V76.10)  Current Medications (verified): 1)  Lexapro 20 Mg Tabs (Escitalopram Oxalate) .[name].. 1 By Mouth Once Daily(Dr.parrish Does Icp) 2)  Proair Hfa 108 (90 Base)  Mcg/act Aers (Albuterol Sulfate) .... 2 Puffs Every 4-6 Hours As Needed For Wheezing/sob 3)  Blood Glucose Meter  Kit (Blood Glucose Monitoring Suppl) .... As Directed 4)  Sidekick Blood Glucose System  Devi (Blood Gluc Meter Disp-Strips) .... As Directed 5)  Lancets  Misc (Lancets) .... As Directed 6)  Aspir-Low 81 Mg Tbec (Aspirin) .... Once Daily 7)  Prednisone 10 Mg Tabs (Prednisone) .... Take 6 Tabs Tues. Take 5 Tabs Wed. Take 4 Tabs Thurs. Take 3 Tabs Fri Take 2 Tabs Sat Take 1 Tab Sun. and Stop 8)  Hydrocortisone 2.5 % Crea (Hydrocortisone) .... Apply To Hands and Feet  Two Times A Day 9)  Flax Seed Oil 1000 Mg Caps (Flaxseed (Linseed)) .... Once Daily 10)  Crestor 5 Mg Tabs (Rosuvastatin Calcium) .... Take One By Mouth Every Monday, Wednesday and Friday For Cholesterol  Allergies (verified): 1)  ! * Statins 2)  ! Percodan  Past History:  Past Surgical History: Last updated: 04/17/2008 s/p colonoscopy 2005 in [place]...had some  polyps...was suppose to f/u 2008. s/p nasoplasty 1968 s/p septoplasty 1972 s/p T&A teenager Tubal ligation 1985  Past Medical History: Depression...sees Dr.Parrish 1x per year...Dr.Parrish does her ICP.Has  had depression since college. Current Problems:  HYPERLIPIDEMIA (ICD-272.4) COLONIC POLYPS, HX OF (ICD-V12.72)   a.  colo 2010; needs f/u 2015 SCOLIOSIS (ICD-737.30) ASTHMA (ICD-493.90) ECZEMA (ICD-692.9)   a.  eczematous psoriasis   b.  Humira Rx  (Derm clinic at [facility]) DEPRESSION (ICD-311)  Physical Exam  General:  alert, well-developed, and well-nourished.   Head:  normocephalic and atraumatic.   Neck:  supple.   Lungs:  normal breath sounds, no crackles, and no wheezes.   Heart:  normal rate and regular rhythm.   Abdomen:  soft and non-tender.   Neurologic:  alert & oriented X3 and cranial nerves II-XII intact.   Skin:  scattered plaques about arms; face hands with some drying and cracking but improved from prior exams Psych:  normally interactive.     Impression & Recommendations:  Problem # 1:  PSORIASIS (ICD-696.1) f/u with derm clinic wants Humira here . . . will arrange  Problem # 2:  HYPERLIPIDEMIA (ICD-272.4) tolerating crestor schedule FLP  Her updated medication list for this problem includes:    Crestor 5 Mg Tabs (Rosuvastatin calcium) .[name]... Take one by mouth every monday, wednesday and friday for cholesterol  Problem # 3:  ASTHMA (ICD-493.90) really copd advised cessation of cigs trial of increased dose of advair if no improvement switch to spiriva . . .samples given  Her updated medication list for this problem includes:    Proair Hfa 108 (90 Base) Mcg/act Aers (Albuterol sulfate) .[name]... 2 puffs every 4-6 hours as needed for wheezing/sob    Prednisone 10 Mg Tabs (Prednisone) .[name]... Take 6 tabs tues. take 5 tabs wed. take 4 tabs thurs. take 3 tabs fri take 2 tabs sat take 1 tab sun. and stop    Advair Diskus 250-50 Mcg/dose Aepb  (Fluticasone-salmeterol) .[name]... 1 inhalation two times a day    Spiriva Handihaler 18 Mcg Caps (Tiotropium bromide monohydrate) .[name]... 1 inhalation once daily  Problem # 4:  ELEVATED BP READING WITHOUT DX HYPERTENSION (ICD-796.2) check BP with rn in 2 weeks if above 130/80, consider adding ACE  Problem # 5:  Preventive Health Care (ICD-V70.0) due for mammo in 04/2010 schedule CPP for then  Problem # 6:  HYPERGLYCEMIA (ICD-790.29)  Orders: Capillary Blood Glucose/CBG (82948) T- Hemoglobin A1C (46270-35009)  Complete Medication List:  1)  Lexapro 20 Mg Tabs (Escitalopram oxalate) .[name].. 1 by mouth once daily(dr.parrish does icp) 2)  Proair Hfa 108 (90 Base) Mcg/act Aers (Albuterol sulfate) .... 2 puffs every 4-6 hours as needed for wheezing/sob 3)  Blood Glucose Meter Kit (Blood glucose monitoring suppl) .... As directed 4)  Sidekick Blood Glucose System Devi (Blood gluc meter disp-strips) .... As directed 5)  Lancets Misc (Lancets) .... As directed 6)  Aspir-low 81 Mg Tbec (Aspirin) .... Once daily 7)  Prednisone 10 Mg Tabs (Prednisone) .... Take 6 tabs tues. take 5 tabs wed. take 4 tabs thurs. take 3 tabs fri take 2 tabs sat take 1 tab sun. and stop 8)  Hydrocortisone 2.5 % Crea (Hydrocortisone) .... Apply to hands and feet  two times a day 9)  Flax Seed Oil 1000 Mg Caps (Flaxseed (linseed)) .... Once daily 10)  Crestor 5 Mg Tabs (Rosuvastatin calcium) .... Take one by mouth every monday, wednesday and friday for cholesterol 11)  Advair Diskus 250-50 Mcg/dose Aepb (Fluticasone-salmeterol) .[name].. 1 inhalation two times a day 12)  Spiriva Handihaler 18 Mcg Caps (Tiotropium bromide monohydrate) .[name].. 1 inhalation once daily  Hypertension Assessment/Plan:      The patient's hypertensive risk group is category B: At least one risk factor (excluding diabetes) with no target organ damage.  Her calculated 10 year risk of coronary heart disease is 8 %.  Today's blood pressure is 136/86.    Patient  Instructions: 1)  Schedule nurse visit to administer Humira injection. 2)  Try higher dose of Advair for one month.  If you feel better with your breathing, call and we can refill this. 3)  If no change in your breathing, try the Spiriva for one month and let me know how it worked. 4)  Tobacco is very bad for your health and your loved ones ! You should stop smoking !  5)  Stop smoking tips: Choose a quit date. Cut down before the quit date. Decide what you will do as a substitute when you feel the urge to smoke(gum, toothpick, exercise).  6)  Try nicotine patches.  Do not smoke with patches on. 7)  Check BP with nurse in 2 weeks. 8)  Return fasting to the lab in the next week for FLP and CMET (Dx 272.4). 9)  Schedule follow up with Lorin Picket in December for CPP.

## 2010-06-30 NOTE — Progress Notes (Signed)
Summary: DROPPED OF NOTE FROM DR TAFFEEN  Phone Note Call from Patient Call back at [facility] Phone [phone]   Summary of Call: Nickie Warwick PT. MS Rounds CAME AND DROPPED OFF A NOT FROM DR TAFFEEN FOR YOU TO LOOK OVER AND HAVE. Initial call taken by: Leodis Rains,  November 21, 2009 1:03 PM

## 2010-06-30 NOTE — Assessment & Plan Note (Signed)
Summary: 3 MONTH F/U DM/CHOLESTEROL///BC   Vital Signs:  Patient profile:   60 year old female Weight:      161.9 pounds Temp:     97.0 degrees F Pulse rate:   76 / minute Pulse rhythm:   regular Resp:     18 per minute BP sitting:   138 / 80  (left arm) Cuff size:   regular  Vitals Entered By: Vesta Mixer CMA (July 28, 2009 8:36 AM) CC: f/u dm and cholesterol check pt is fasting., Lipid Management Is Patient Diabetic? Yes Pain Assessment Patient in pain? no      CBG Result 92  Does patient need assistance? Ambulation Normal   Primary Care Provider:  Tereso Newcomer PA-C  CC:  f/u dm and cholesterol check pt is fasting. and Lipid Management.  History of Present Illness: Here for f/u.  Eczema: Much better.  Hands are itching quite a bit.  But, open sores have healed.  She sees dermatology tomorrow.  Glucose Intolerance:  Sugars at run 100-110.  Highest was 118 since last visit.  Following diabetic diet.  Due for A1C today.  Hyperlipidemia:  Taking fish oil.  Intolerant to statins.  Has changed diet.  Due for f/u today.  Asthma/COPD:  Still smoking 1 ppd.      Asthma History    Asthma Control Assessment:    Age range: 12+ years    Symptoms: >2 days/week    Nighttime Awakenings: 1-3/week    Interferes w/ normal activity: some limitations    SABA use (not for EIB): several times per day    Asthma Control Assessment: Very Poorly Controlled  Lipid Management History:      Positive NCEP/ATP III risk factors include female age 55 years old or older and current tobacco user.  Negative NCEP/ATP III risk factors include HDL cholesterol greater than 60.      Allergies (verified): 1)  ! * Statins 2)  ! Percodan  Review of Systems      See HPI General:  Denies chills and fever. CV:  Denies chest pain or discomfort and fainting. Resp:  Complains of cough; denies coughing up blood. Psych:  Denies suicidal thoughts/plans.  Physical Exam  General:  alert,  well-developed, and well-nourished.   Head:  normocephalic and atraumatic.   Neck:  supple.   Lungs:  decreased breath sounds no wheezes no rales  Heart:  normal rate and regular rhythm.   Abdomen:  soft.   Extremities:  no edema Neurologic:  alert & oriented X3 and cranial nerves II-XII intact.   Skin:  right hand much improved some desquamation noted on palm cracking is decreased no open wounds  Psych:  normally interactive.     Impression & Recommendations:  Problem # 1:  ASTHMA (ICD-493.90) I would say that she has COPD instead of asthma she has a morning cough that is productive of sputum and was dx in her 30s after prolonged smoking discussed adding advair or spiriva she is still resistant to these meds she has fears of side effects of meds but she is willing to continue smoking discussed with her that her breathing will get worse advised her to quit smoking  Her updated medication list for this problem includes:    Proair Hfa 108 (90 Base) Mcg/act Aers (Albuterol sulfate) .[name]... 2 puffs every 4-6 hours as needed for wheezing/sob    Prednisone 10 Mg Tabs (Prednisone) .[name]... Take 6 tabs tues. take 5 tabs wed. take 4 tabs thurs.  take 3 tabs fri take 2 tabs sat take 1 tab sun. and stop  Problem # 2:  HYPERGLYCEMIA (ICD-790.29)  glucose intol following a good diet and sugars at home sound good check A1C today  Orders: T- Hemoglobin A1C ([id number])  Problem # 3:  HYPERLIPIDEMIA (ICD-272.4)  check labs today she is intol to statins  Orders: T-Comprehensive Metabolic Panel ([id number]) T-Lipid Profile ([id number])  Problem # 4:  DEPRESSION (ICD-311) followed by Dr. Emerson Monte (Psychiatrist) Lexapro fills her meds mood is stable  Her updated medication list for this problem includes:    Lexapro 20 Mg Tabs (Escitalopram oxalate) .[name]... 1 by mouth once daily(dr.parrish does icp)  Problem # 5:  ECZEMA, HANDS (ICD-692.9) much improved advised her to  continue triam cream for a total of 2 weeks then, she can use emollient creams/vaseline for maint has derm appt tomorrow   Her updated medication list for this problem includes:    Prednisone 10 Mg Tabs (Prednisone) .[name]... Take 6 tabs tues. take 5 tabs wed. take 4 tabs thurs. take 3 tabs fri take 2 tabs sat take 1 tab sun. and stop    Hydrocortisone 2.5 % Crea (Hydrocortisone) .[name]... Apply to hands and feet  two times a day  Problem # 6:  Preventive Health Care (ICD-V70.0) get Td today colo done in 10/2008 mammo done in 04/2009 pap due in May, but has had several normals . . . schedule next CPP in 04/2010  Complete Medication List: 1)  Lexapro 20 Mg Tabs (Escitalopram oxalate) .[name].. 1 by mouth once daily(dr.parrish does icp) 2)  Proair Hfa 108 (90 Base) Mcg/act Aers (Albuterol sulfate) .... 2 puffs every 4-6 hours as needed for wheezing/sob 3)  Blood Glucose Meter Kit (Blood glucose monitoring suppl) .... As directed 4)  Sidekick Blood Glucose System Devi (Blood gluc meter disp-strips) .... As directed 5)  Lancets Misc (Lancets) .... As directed 6)  Aspir-low 81 Mg Tbec (Aspirin) .... Once daily 7)  Prednisone 10 Mg Tabs (Prednisone) .... Take 6 tabs tues. take 5 tabs wed. take 4 tabs thurs. take 3 tabs fri take 2 tabs sat take 1 tab sun. and stop 8)  Hydrocortisone 2.5 % Crea (Hydrocortisone) .... Apply to hands and feet  two times a day 9)  Fish Oil 1000 Mg Caps (Omega-3 fatty acids) .... Take 1 capsule by mouth two times a day  Other Orders: Capillary Blood Glucose/CBG (30865)  Lipid Assessment/Plan:      Based on NCEP/ATP III, the patient's risk factor category is "0-1 risk factors".  The patient's lipid goals are as follows: Total cholesterol goal is 200; LDL cholesterol goal is 160; HDL cholesterol goal is 40; Triglyceride goal is 150.    Patient Instructions: 1)  Schedule retasure at [facility]. 2)  Td shot today. 3)  Please schedule a follow-up appointment in 3 months  with Liboria Putnam for asthma, sugar and cholesterol. 4)  Tobacco is very bad for your health and your loved ones ! You should stop smoking !  5)  Stop smoking tips: Choose a quit date. Cut down before the quit date. Decide what you will do as a substitute when you feel the urge to smoke(gum, toothpick, exercise).  6)  Please consider starting spiriva for your breathing. 7)  Use the triamcinolone cream for a total of 2 weeks. 8)  You can use daily moisturizers for your hands several times a day after that and try vaseline at bedtime with covering up your hands. 9)  See the dermatologist as scheduled.  Laboratory Results   Blood Tests   Date/Time Received: July 28, 2009 9:45 AM   HGBA1C: 6.2%   (Normal Range: Non-Diabetic - 3-6%   Control Diabetic - 6-8%) CBG Random:: 92mg /dL

## 2010-07-02 NOTE — Letter (Signed)
Summary: [facility]   Imported By: Arta Bruce 06/03/2010 11:17:43  _____________________________________________________________________  External Attachment:    Type:   Image     Comment:   External Document

## 2010-07-02 NOTE — Letter (Signed)
Summary: [facility]   Imported By: Arta Bruce 05/18/2010 16:37:56  _____________________________________________________________________  External Attachment:    Type:   Image     Comment:   External Document

## 2010-07-02 NOTE — Letter (Signed)
Summary: [facility]   Imported By: Arta Bruce 05/18/2010 16:34:00  _____________________________________________________________________  External Attachment:    Type:   Image     Comment:   External Document

## 2010-07-03 NOTE — Letter (Signed)
Summary: [facility]   Imported By: Arta Bruce 05/18/2010 16:35:37  _____________________________________________________________________  External Attachment:    Type:   Image     Comment:   External Document

## 2010-07-08 NOTE — Letter (Signed)
Summary: [facility]   Imported By: Arta Bruce 06/30/2010 10:28:22  _____________________________________________________________________  External Attachment:    Type:   Image     Comment:   External Document

## 2010-07-10 ENCOUNTER — Encounter (INDEPENDENT_AMBULATORY_CARE_PROVIDER_SITE_OTHER): Payer: Self-pay | Admitting: Nurse Practitioner

## 2010-07-10 ENCOUNTER — Encounter: Payer: Self-pay | Admitting: Nurse Practitioner

## 2010-07-10 LAB — CONVERTED CEMR LAB
AST: 19 units/L (ref 0–37)
Albumin: 4.3 g/dL (ref 3.5–5.2)
BUN: 13 mg/dL (ref 6–23)
Basophils Relative: 0 % (ref 0–1)
CO2: 27 meq/L (ref 19–32)
Calcium: 9.7 mg/dL (ref 8.4–10.5)
Chloride: 104 meq/L (ref 96–112)
Cholesterol: 272 mg/dL — ABNORMAL HIGH (ref 0–200)
Creatinine, Ser: 0.81 mg/dL (ref 0.40–1.20)
Glucose, Bld: 92 mg/dL (ref 70–99)
HDL: 80 mg/dL (ref >39)
Hemoglobin: 15.4 g/dL — ABNORMAL HIGH (ref 12.0–15.0)
Lymphs Abs: 3.2 10*3/uL (ref 0.7–4.0)
MCHC: 32.7 g/dL (ref 30.0–36.0)
MCV: 94.4 fL (ref 78.0–100.0)
Monocytes Absolute: 0.7 10*3/uL (ref 0.1–1.0)
Monocytes Relative: 8 % (ref 3–12)
Neutro Abs: 4.7 10*3/uL (ref 1.7–7.7)
RBC: 4.99 M/uL (ref 3.87–5.11)
Total CHOL/HDL Ratio: 3.4
Triglycerides: 74 mg/dL (ref <150)
WBC: 9 10*3/uL (ref 4.0–10.5)

## 2010-07-11 ENCOUNTER — Telehealth (INDEPENDENT_AMBULATORY_CARE_PROVIDER_SITE_OTHER): Payer: Self-pay | Admitting: Internal Medicine

## 2010-07-16 NOTE — Assessment & Plan Note (Signed)
Summary: Asthma   Vital Signs:  Patient profile:   60 year old female Weight:      156.4 pounds BMI:     29.66 Temp:     98.1 degrees F oral Pulse rate:   72 / minute Pulse rhythm:   regular Resp:     20 per minute BP sitting:   140 / 90  (left arm) Cuff size:   regular  Vitals Entered By: Levon Hedger (July 10, 2010 10:04 AM)  Nutrition Counseling: Patient's BMI is greater than 25 and therefore counseled on weight management options. CC: follow-up visit, Lipid Management, Depression Is Patient Diabetic? No Pain Assessment Patient in pain? no       Does patient need assistance? Functional Status Self care Ambulation Normal   Primary Care Provider:  Tereso Newcomer PA-C  CC:  follow-up visit, Lipid Management, and Depression.  History of Present Illness:  Pt into the office for follow up .  Pt presents today with all her medication  Eczema - pt presents today with a photo book that documents her skin lesions. She has been to see the Dermatology and she was started on Humira and soriatane. Pt sees Dr. Avon Gully at the volunteer clinic at [facility]  Asthma - Pt has been out of spiriva for about 2-3 months as she has questions about if it causes cataracts.  She has also completed her supply of proair  Asthma History    Asthma Control Assessment:    Age range: 12+ years    Symptoms: throughout the day    Nighttime Awakenings: 1-3/week    Interferes w/ normal activity: no limitations    SABA use (not for EIB): >2 days/week    Exacerbations requiring oral systemic steroids: 0-1/year    Asthma Control Assessment: Very Poorly Controlled The patient denies that she feels like life is not worth living, denies that she wishes that she were dead, and denies that she has thought about ending her life.        Comments:  Pt is seeing Dr. Nolen Mu at least once per year.  Meds are prescribed by that provider.  Lipid Management History:      Positive NCEP/ATP III risk  factors include female age 51 years old or older, diabetes, and current tobacco user.  Negative NCEP/ATP III risk factors include HDL cholesterol greater than 60, non-hypertensive, no ASHD (atherosclerotic heart disease), no prior stroke/TIA, no peripheral vascular disease, and no history of aortic aneurysm.        She notes side effects from her lipid-lowering medication.  Comments include: pt stopped taking crestor due to side effects.  Comments: Pt takes Omega 3 Krill Oil 500mg  by mouth two times a day and Ultimate Choleserol formula which contains some Niacin.  Recent labs done in this office in January 2011.      Habits & Providers  Alcohol-Tobacco-Diet     Alcohol drinks/day: 0     Alcohol Counseling: not indicated; patient does not drink     Tobacco Status: current     Tobacco Counseling: to quit use of tobacco products     Cigarette Packs/Day: 1.0     Year Started: 1970     Pack years: 40     Diet Comments: infrequent meals  Exercise-Depression-Behavior     Does Patient Exercise: yes     Type of exercise: yard work, bike riding     Times/week: <3     Drug Use: never  Allergies (verified): 1)  ! *  Statins 2)  ! Percodan  Review of Systems General:  Denies fever. CV:  Denies chest pain or discomfort. Resp:  Complains of shortness of breath and wheezing. GI:  Denies abdominal pain, nausea, and vomiting. Derm:  Complains of lesion(s) and rash; Eczema.  Physical Exam  General:  alert.   Head:  normocephalic.   Eyes:  glasse Lungs:  decreased air flow throughout wheezing with expiration Heart:  normal rate and regular rhythm.   Abdomen:  normal bowel sounds.   Msk:  up to the exam table Neurologic:  alert & oriented X3.   Skin:  mulitple plaques and skin lesions from eczema Psych:  Oriented X3.     Impression & Recommendations:  Problem # 1:  ASTHMA (ICD-493.90) Pt is still smoking - advise cessation will refill spiriva and MDI nebulizer given today in  office The following medications were removed from the medication list:    Prednisone 10 Mg Tabs (Prednisone) .[name]... Take 6 tabs tues. take 5 tabs wed. take 4 tabs thurs. take 3 tabs fri take 2 tabs sat take 1 tab sun. and stop    Advair Diskus 250-50 Mcg/dose Aepb (Fluticasone-salmeterol) .[name]... 1 inhalation two times a day Her updated medication list for this problem includes:    Ventolin Hfa 108 (90 Base) Mcg/act Aers (Albuterol sulfate) .[name]..[name] Two puffs every 6 hours as needed for shortness of breath    Spiriva Handihaler 18 Mcg Caps (Tiotropium bromide monohydrate) ..... One capsule inhalation once daily  Orders: Peak Flow Rate (94150) Pulse Oximetry (single measurment) (94760) Nebulizer Tx (78295)  Problem # 2:  ECZEMA (ICD-692.9) will given humira in office today (pt bough her own  supply) The following medications were removed from the medication list:    Prednisone 10 Mg Tabs (Prednisone) .[name]... Take 6 tabs tues. take 5 tabs wed. take 4 tabs thurs. take 3 tabs fri take 2 tabs sat take 1 tab sun. and stop Her updated medication list for this problem includes:    Hydrocortisone 2.5 % Crea (Hydrocortisone) .[name]... Apply to hands and feet  two times a day    Triamcinolone Acetonide 0.1 % Crea (Triamcinolone acetonide) .[name]... Apply once daily x 1 month  Problem # 3:  HYPERLIPIDEMIA (ICD-272.4)  The following medications were removed from the medication list:    Crestor 10 Mg Tabs (Rosuvastatin calcium) .[name]... Take 1 by mouth every mon, wed, fri. for cholesterol  Problem # 4:  ELEVATED BP READING WITHOUT DX HYPERTENSION (ICD-796.2) will continue to monitor doing well on last visit advised pt to restart asthma meds  Complete Medication List: 1)  Lexapro 20 Mg Tabs (Escitalopram oxalate) .[name].. 1 by mouth once daily(dr.parrish does icp) 2)  Ventolin Hfa 108 (90 Base) Mcg/act Aers (Albuterol sulfate) .... Two puffs every 6 hours as needed for shortness of breath 3)  Blood Glucose Meter Kit  (Blood glucose monitoring suppl) .... As directed 4)  Sidekick Blood Glucose System Devi (Blood gluc meter disp-strips) .... As directed 5)  Lancets Misc (Lancets) .... As directed 6)  Aspir-low 81 Mg Tbec (Aspirin) .... Once daily 7)  Hydrocortisone 2.5 % Crea (Hydrocortisone) .... Apply to hands and feet  two times a day 8)  Flax Seed Oil 1000 Mg Caps (Flaxseed (linseed)) .... Once daily 9)  Spiriva Handihaler 18 Mcg Caps (Tiotropium bromide monohydrate) .... One capsule inhalation once daily 10)  Triamcinolone Acetonide 0.1 % Crea (Triamcinolone acetonide) .... Apply once daily x 1 month 11)  Humira 40 Mg/0.10ml Kit (Adalimumab) .Marland KitchenMarland KitchenMarland Kitchen  Inject every 2 weeks 12)  Soriatane 10 Mg Caps (Acitretin) .... 2 capsules by mouth daily  Lipid Assessment/Plan:      Based on NCEP/ATP III, the patient's risk factor category is "history of diabetes".  The patient's lipid goals are as follows: Total cholesterol goal is 200; LDL cholesterol goal is 100; HDL cholesterol goal is 40; Triglyceride goal is 150.    Patient Instructions: 1)  NICE TO MEET YOU! 2)  Cholesterol - print out of readings given to you today. 3)  You are unable to take statins by self report 4)  Keep taking over the counter meds but you should also monitor you diet. 5)  Asthma - You should restart the spiriva daily. 6)  Proventil will be as needed for shortness of breath 7)  You were given a breathing treatment today in office 8)  Elevated blood pressure - your blood pressure is 140/90 9)  Please be sure to monitor.  If consecutively high you will need a low dose medication.  Try to decrease sodium in your diet. 10)  Follow up in 3 months for asthma.  Will need peak flow, o2 sat.  If blood pressure is still high then will need meds. Prescriptions: VENTOLIN HFA 108 (90 BASE) MCG/ACT AERS (ALBUTEROL SULFATE) Two puffs every 6 hours as needed for shortness of breath  #1 x 5   Entered and Authorized by:   Lehman Prom FNP   Signed by:    Lehman Prom FNP on 07/10/2010   Method used:   Faxed to ...       [facility] - [facility] (retail)       [address] [place], [place]  [address]       Ph: [phone]       Fax: [phone]   RxID:   [id number] SPIRIVA HANDIHALER 18 MCG CAPS (TIOTROPIUM BROMIDE MONOHYDRATE) One capsule inhalation once daily  #1 month x 5   Entered and Authorized by:   Lehman Prom FNP   Signed by:   Lehman Prom FNP on 07/10/2010   Method used:   Faxed to ...       [facility] - Pharmac (retail)       [address] [place], [place]  [address]       Ph: [phone]       Fax: [phone]   RxID:   [id number]    Orders Added: 1)  Est. Patient Level IV [88416] 2)  Peak Flow Rate [94150] 3)  Pulse Oximetry (single measurment) [94760] 4)  Nebulizer Tx [94640]    Prevention & Chronic Care Immunizations   Influenza vaccine: Fluvax 3+  (04/29/2009)   Influenza vaccine deferral: Refused  (07/10/2010)    Tetanus booster: 06/04/2002: historical per patient    Pneumococcal vaccine: Pneumovax  (04/29/2009)    H. zoster vaccine: Not documented  Colorectal Screening   Hemoccult: Not documented    Colonoscopy: Hypertrophied anal papillae and sigmoid divertilosis only  Dr. Matthias Hughs  (11/21/2008)  Other Screening   Pap smear: NEGATIVE FOR INTRAEPITHELIAL LESIONS OR MALIGNANCY.  (05/28/2010)    Mammogram: ASSESSMENT: Negative - BI-RADS 1^MM DIGITAL SCREENING  (05/20/2010)    DXA bone density scan: Not documented   Smoking status: current  (07/10/2010)   Smoking cessation counseling: YES  (11/18/2009)  Lipids   Total Cholesterol: 192  (11/26/2009)   LDL: 116  (11/26/2009)   LDL Direct:  Not documented   HDL: 64  (11/26/2009)   Triglycerides: 60  (11/26/2009)    SGOT (AST): 20  (11/26/2009)   SGPT (ALT): 15  (11/26/2009)   Alkaline phosphatase: 79  (11/26/2009)   Total bilirubin: 0.5   (11/26/2009)  Self-Management Support :    Lipid self-management support: Not documented    Appended Document: Asthma   Medication Administration  Injection # 1:    Medication: humira    Diagnosis: ECZEMA (ICD-692.9)    Route: SQ    Site: right abdomen    Exp Date: 01/29/2011    Lot #: [id number]    Mfr: Abbott    Comments: [id number]    Patient tolerated injection without complications    Given by: Dutch Quint RN (July 10, 2010 2:53 PM)  Orders Added: 1)  Admin of patients own med IM/SQ [96372M]    Appended Document: Asthma     Vital Signs:  Patient profile:   60 year old female O2 Sat:      88 % on Room air  O2 Flow:  Room air  Serial Vital Signs/Assessments:  Comments: 250,  200,  210 By: Levon Hedger  p/f (post)  350,  330,  350 @ 11:25PM By: Levon Hedger    Allergies: 1)  ! * Statins 2)  ! Percodan   Complete Medication List: 1)  Lexapro 20 Mg Tabs (Escitalopram oxalate) .[name].. 1 by mouth once daily(dr.parrish does icp) 2)  Ventolin Hfa 108 (90 Base) Mcg/act Aers (Albuterol sulfate) .... Two puffs every 6 hours as needed for shortness of breath 3)  Blood Glucose Meter Kit (Blood glucose monitoring suppl) .... As directed 4)  Sidekick Blood Glucose System Devi (Blood gluc meter disp-strips) .... As directed 5)  Lancets Misc (Lancets) .... As directed 6)  Aspir-low 81 Mg Tbec (Aspirin) .... Once daily 7)  Hydrocortisone 2.5 % Crea (Hydrocortisone) .... Apply to hands and feet  two times a day 8)  Flax Seed Oil 1000 Mg Caps (Flaxseed (linseed)) .... Once daily 9)  Spiriva Handihaler 18 Mcg Caps (Tiotropium bromide monohydrate) .... One capsule inhalation once daily 10)  Triamcinolone Acetonide 0.1 % Crea (Triamcinolone acetonide) .... Apply once daily x 1 month 11)  Humira 40 Mg/0.65ml Kit (Adalimumab) .... Inject every 2 weeks 12)  Soriatane 10 Mg Caps (Acitretin) .... 2 capsules by mouth daily  Appended Document:  Asthma     Allergies: 1)  ! * Statins 2)  ! Percodan   Complete Medication List: 1)  Lexapro 20 Mg Tabs (Escitalopram oxalate) .[name].. 1 by mouth once daily(dr.parrish does icp) 2)  Ventolin Hfa 108 (90 Base) Mcg/act Aers (Albuterol sulfate) .... Two puffs every 6 hours as needed for shortness of breath 3)  Blood Glucose Meter Kit (Blood glucose monitoring suppl) .... As directed 4)  Sidekick Blood Glucose System Devi (Blood gluc meter disp-strips) .... As directed 5)  Lancets Misc (Lancets) .... As directed 6)  Aspir-low 81 Mg Tbec (Aspirin) .... Once daily 7)  Hydrocortisone 2.5 % Crea (Hydrocortisone) .... Apply to hands and feet  two times a day 8)  Flax Seed Oil 1000 Mg Caps (Flaxseed (linseed)) .... Once daily 9)  Spiriva Handihaler 18 Mcg Caps (Tiotropium bromide monohydrate) .... One capsule inhalation once daily 10)  Triamcinolone Acetonide 0.1 % Crea (Triamcinolone acetonide) .... Apply once daily x 1 month 11)  Humira 40 Mg/0.71ml Kit (Adalimumab) .... Inject every 2 weeks 12)  Soriatane 10 Mg Caps (Acitretin) .... 2 capsules by mouth  daily  Other Orders: Albuterol Sulfate Sol 1mg  unit dose (E4540) Ipratropium inhalation sol. unit dose (J8119) Nebulizer Tx (14782)   Medication Administration  Medication # 3:    Medication: Albuterol Sulfate Sol 1mg  unit dose    Diagnosis: ASTHMA (ICD-493.90)    Dose: 2.5mg     Route: po    Exp Date: 9/12    Lot #: [id number]    Mfr: Nephron    Patient tolerated medication without complications    Given by: Levon Hedger (July 10, 2010 4:59 PM)  Medication # 4:    Medication: Ipratropium inhalation sol. unit dose    Diagnosis: ASTHMA (ICD-493.90)    Dose: 0.5mg p    Route: po    Exp Date: 08/12    Lot #: [id number]    Mfr: Nephron    Patient tolerated medication without complications    Given by: Levon Hedger (July 10, 2010 5:01 PM)  Orders Added: 1)  Albuterol Sulfate Sol 1mg  unit dose [J7613] 2)  Ipratropium  inhalation sol. unit dose [J7644] 3)  Nebulizer Tx [94640]      Medication Administration  Medication # 3:    Medication: Albuterol Sulfate Sol 1mg  unit dose    Diagnosis: ASTHMA (ICD-493.90)    Dose: 2.5mg     Route: po    Exp Date: 9/12    Lot #: [id number]    Mfr: Nephron    Patient tolerated medication without complications    Given by: Levon Hedger (July 10, 2010 4:59 PM)  Medication # 4:    Medication: Ipratropium inhalation sol. unit dose    Diagnosis: ASTHMA (ICD-493.90)    Dose: 0.5mg p    Route: po    Exp Date: 08/12    Lot #: [id number]    Mfr: Nephron    Patient tolerated medication without complications    Given by: Levon Hedger (July 10, 2010 5:01 PM)  Orders Added: 1)  Albuterol Sulfate Sol 1mg  unit dose [J7613] 2)  Ipratropium inhalation sol. unit dose [J7644] 3)  Nebulizer Tx [phone]

## 2010-07-21 ENCOUNTER — Encounter (INDEPENDENT_AMBULATORY_CARE_PROVIDER_SITE_OTHER): Payer: Self-pay | Admitting: Internal Medicine

## 2010-07-28 ENCOUNTER — Encounter (INDEPENDENT_AMBULATORY_CARE_PROVIDER_SITE_OTHER): Payer: Self-pay | Admitting: Internal Medicine

## 2010-07-28 NOTE — Progress Notes (Signed)
Summary: Derm Labs  Phone Note Outgoing Call   Summary of Call: Melinda--did Dermatology provider want the results of the labs drawn in January?-they requested them.  Do we just fax to [facility] and have them held there for next follow up? Initial call taken by: Julieanne Manson MD,  July 11, 2010 4:21 PM  Follow-up for Phone Call        No liver problems per labs so I will give them to Derm on her followup visit. No need to fax since I can get them off computer. Thanks. Follow-up by: Gaylyn Cheers RN,  July 14, 2010 9:10 AM

## 2010-08-06 NOTE — Letter (Signed)
Summary: [facility]   Imported By: Arta Bruce 07/29/2010 09:49:00  _____________________________________________________________________  External Attachment:    Type:   Image     Comment:   External Document

## 2010-08-06 NOTE — Miscellaneous (Signed)
Summary: Dr Ronda Fairly  Clinical Lists Changes  Medications: Changed medication from HUMIRA 40 MG/0.8ML KIT (ADALIMUMAB) inject every 2 weeks to STELARA 45 MG/0.5ML SOLN (USTEKINUMAB) inject 45 mg subcutaneously Week 0 and week 4, then every 12 weeks.  Dr. Jorja Loa

## 2010-08-11 NOTE — Medication Information (Signed)
Summary: RX Folder//SCRIPT  RX Folder//SCRIPT   Imported By: Arta Bruce 08/03/2010 14:48:30  _____________________________________________________________________  External Attachment:    Type:   Image     Comment:   External Document

## 2010-08-20 ENCOUNTER — Encounter (INDEPENDENT_AMBULATORY_CARE_PROVIDER_SITE_OTHER): Payer: Self-pay | Admitting: Nurse Practitioner

## 2010-09-01 NOTE — Progress Notes (Signed)
[facility]   Imported By: Arta Bruce 08/26/2010 14:36:58  _____________________________________________________________________  External Attachment:    Type:   Image     Comment:   External Document

## 2010-10-13 NOTE — Op Note (Signed)
Tasha [name], Tasha [name]                ACCOUNT NO.:  [id number]   MEDICAL RECORD NO.:  [id number]          PATIENT TYPE:  AMB   LOCATION:  ENDO                         FACILITY:  [facility]   PHYSICIAN:  Bernette Redbird, M.D.   DATE OF BIRTH:  03-Aug-1950   DATE OF PROCEDURE:  11/21/2008  DATE OF DISCHARGE:  11/21/2008                               OPERATIVE REPORT   PROCEDURE:  Colonoscopy.   INDICATIONS:  A 60 year old female for colon cancer screening.  There is  a prior history of some sort of small polyps, having been removed from  her colon about 7 years ago by Dr. Jennye Boroughs in [place].  Records are  not currently available.  There is also a family history of colon cancer  in her mother, but this was detected at an advanced age, at which time  it was very far advanced, so the date of onset is not truly known.  Finally, the patient has some sort of an anal protrusion.   FINDINGS:  Hypertrophied anal papilla.  Sigmoid diverticulosis.  No  polyps.   PROCEDURE:  The purpose and risks of the procedure have been discussed  with the patient who provided written consent.  She had come as an  outpatient to the [facility].  Sedation was fentanyl 100  mcg and Versed 11 mg IV prior to and during the course this somewhat  difficult procedure.   The patient has a history of asthma and did desaturate somewhat, down  into the 80s, when given Versed.  This responded to using a face mask  and turning up the oxygen.   The procedure was initiated with the Pentax pediatric video colonoscope,  but this would not advance very far, since it tended to loop and lock.  I estimate I reached the region of the splenic flexure.  We then  switched over to the Pentax adult video colonoscope.  With external  abdominal compression, this was able to advance fairly readily around  the colon to the cecum as identified by clear visualization of the  appendiceal orifice, and pullback was then performed.   The quality of  prep was very good, and it is felt that all areas were well seen.   There was moderate sigmoid diverticulosis but no colitis, polyps,  masses, or vascular malformations were noted.  Retroflexion in the  rectum as well as pullout through the anal canal demonstrated a  prominent hypertrophied anal papilla on a long stalk, but I did not see  any rectal polyps as such.  Reinspection of the rectum was unremarkable.   No biopsies were obtained.  The patient tolerated the procedure well,  and there were no apparent complications.   IMPRESSION:  1. Sigmoid diverticulosis.  2. Hypertrophied anal papilla.  3. Family history of colon cancer at an advanced age.  4. Prior history of colon polyps of indeterminate histology.   PLAN:  Probable colonoscopic followup in 5 years to be conservative,  since it will probably not be able to obtain the histologic findings  from the patient's previous colon polyps, which were removed  by a  physician who is no longer in practice.           ______________________________  Bernette Redbird, M.D.     RB/MEDQ  D:  11/21/2008  T:  11/22/2008  Job:  [id number]   cc:   Fanny Dance. Rankins, M.D.

## 2011-04-15 ENCOUNTER — Other Ambulatory Visit (HOSPITAL_COMMUNITY): Payer: Self-pay | Admitting: Family Medicine

## 2011-04-15 DIAGNOSIS — Z1231 Encounter for screening mammogram for malignant neoplasm of breast: Secondary | ICD-10-CM

## 2011-05-26 ENCOUNTER — Ambulatory Visit (HOSPITAL_COMMUNITY)
Admission: RE | Admit: 2011-05-26 | Discharge: 2011-05-26 | Disposition: A | Discharge status: Home / Self Care | Payer: Self-pay | Source: Ambulatory Visit | Attending: Family Medicine | Admitting: Family Medicine

## 2011-05-26 DIAGNOSIS — Z1231 Encounter for screening mammogram for malignant neoplasm of breast: Secondary | ICD-10-CM | POA: Insufficient documentation

## 2011-09-23 ENCOUNTER — Ambulatory Visit (INDEPENDENT_AMBULATORY_CARE_PROVIDER_SITE_OTHER): Payer: Medicare Other | Admitting: Family Medicine

## 2011-09-23 VITALS — BP 133/89 | HR 79 | Temp 99.0°F | Resp 16 | Ht 61.0 in | Wt 161.0 lb

## 2011-09-23 DIAGNOSIS — Z79899 Other long term (current) drug therapy: Secondary | ICD-10-CM | POA: Diagnosis not present

## 2011-09-23 DIAGNOSIS — L408 Other psoriasis: Secondary | ICD-10-CM | POA: Diagnosis not present

## 2011-09-23 DIAGNOSIS — L308 Other specified dermatitis: Secondary | ICD-10-CM

## 2011-09-23 MED ORDER — HYDROXYZINE HCL 10 MG PO TABS: ORAL_TABLET | ORAL | Status: DC

## 2011-09-23 MED ORDER — METHYLPREDNISOLONE ACETATE 80 MG/ML IJ SUSP
80.0000 mg | Freq: Once | INTRAMUSCULAR | Status: AC
  Administered 2011-09-23: 80 mg via INTRAMUSCULAR

## 2011-09-23 MED ORDER — PREDNISONE 20 MG PO TABS: ORAL_TABLET | ORAL | Status: DC

## 2011-09-23 NOTE — Progress Notes (Signed)
  Subjective:    Patient ID: Tasha [name], female    DOB: February 18, 1951, 61 y.o.   MRN: [id number]  HPI Tasha [name] is a 61 y.o. 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 Nicholas Lose,  on feet, chest, arms. Dr. Jorja Loa is dermatologist now - last OV 6 months ago.  Prior on Humira - no relief after 16 months.  On Stelara for a year, last used March 6th - injected every 12 weeks, but not renewing due to cost.  Current flair on face and arms, neck, feet, hands, for past 2 weeks.     Tx: atarax - 2 today, but running out.  Using vaseine at night. And gloves.   Flair in March 2012 with depomedrol shot and prednisone taper.  Agitation with prednisone, but no hallucinations and tolerated ok otherwise.  No primary care, [facility] for acute OV's.    No recent labs. Last po 3-4 hours ago.  No new meds/creams.   Review of Systems  Constitutional: Negative for fever and chills.  Respiratory: Negative for shortness of breath.   Skin: Positive for rash.       Objective:   Physical Exam  Constitutional: She is oriented to person, place, and time. She appears well-developed.  HENT:  Head: Normocephalic and atraumatic.  Cardiovascular: Normal rate, regular rhythm, normal heart sounds and intact distal pulses.   Pulmonary/Chest: Breath sounds normal.  Neurological: She is alert and oriented to person, place, and time.  Skin: Skin is warm. Rash noted.       See photos - diffuse erythematous papular scaled rash on arms, face, periorbital, and hands with cracking.   Psychiatric: She has a normal mood and affect. Her behavior is normal.   Results for orders placed in visit on 09/23/11  GLUCOSE, POCT (MANUAL RESULT ENTRY)      Component Value Range   POC Glucose 78          Assessment & Plan:  Tasha [name] is a 61 y.o. female Psoriasiform dermatitis, with recent flair.   Will treat as in past: depomedrol, 80mg IM, and prednisone 20 mg -  3, 3, 3, 2, 2,2, 1,1,1, #18.  SED and psychosis precautions given. Atarax 10mg  1-2 Q6hprn - # 100, no refill, vaseline to cover lesions.  rtc precautions. Patient to call derm tomorrow to schedule follow up in next few weeks to decide follow up plan, as does not plan on continuing Stelara.    Recheck in next few days if not improving, sooner if worse.

## 2011-09-23 NOTE — Patient Instructions (Addendum)
Start prednisone as prescribed, cover areas with vaseline as instructed by your dermatologist.  Atarax as prescribed for itching.  Return to the clinic or go to the nearest emergency room if any of your symptoms worsen or new symptoms occur.  Call your dermatologist tomorrow to schedule follow up in the next few weeks to discuss medication plan. Return here sooner if not improving, or any worsening.

## 2011-10-09 ENCOUNTER — Encounter (HOSPITAL_BASED_OUTPATIENT_CLINIC_OR_DEPARTMENT_OTHER): Payer: Self-pay | Admitting: *Deleted

## 2011-10-09 ENCOUNTER — Emergency Department (HOSPITAL_BASED_OUTPATIENT_CLINIC_OR_DEPARTMENT_OTHER)
Admission: EM | Admit: 2011-10-09 | Discharge: 2011-10-09 | Disposition: A | Discharge status: Home / Self Care | Payer: Medicare Other | Attending: Emergency Medicine | Admitting: Emergency Medicine

## 2011-10-09 DIAGNOSIS — Z79899 Other long term (current) drug therapy: Secondary | ICD-10-CM | POA: Insufficient documentation

## 2011-10-09 DIAGNOSIS — F17200 Nicotine dependence, unspecified, uncomplicated: Secondary | ICD-10-CM | POA: Insufficient documentation

## 2011-10-09 DIAGNOSIS — L259 Unspecified contact dermatitis, unspecified cause: Secondary | ICD-10-CM | POA: Diagnosis not present

## 2011-10-09 DIAGNOSIS — R21 Rash and other nonspecific skin eruption: Secondary | ICD-10-CM | POA: Diagnosis not present

## 2011-10-09 DIAGNOSIS — J45909 Unspecified asthma, uncomplicated: Secondary | ICD-10-CM | POA: Diagnosis not present

## 2011-10-09 HISTORY — DX: Other specified dermatitis: L30.8

## 2011-10-09 MED ORDER — METHYLPREDNISOLONE SODIUM SUCC 125 MG IJ SOLR
125.0000 mg | Freq: Once | INTRAMUSCULAR | Status: AC
  Administered 2011-10-09: 125 mg via INTRAMUSCULAR
  Filled 2011-10-09: qty 2

## 2011-10-09 MED ORDER — SULFAMETHOXAZOLE-TRIMETHOPRIM 800-160 MG PO TABS: 1.0000 | ORAL_TABLET | Freq: Two times a day (BID) | ORAL | Status: DC

## 2011-10-09 MED ORDER — PREDNISONE 10 MG PO TABS: ORAL_TABLET | ORAL | Status: DC

## 2011-10-09 NOTE — ED Notes (Signed)
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 states that she has a hx of cellulitis and that prednisone has helped in the past

## 2011-10-09 NOTE — ED Provider Notes (Signed)
History     CSN: [id number]  Arrival date & time 10/09/11  4098   First MD Initiated Contact with Patient 10/09/11 2017      Chief Complaint  Patient presents with  . Rash    (Consider location/radiation/quality/duration/timing/severity/associated sxs/prior treatment) Patient is a 61 y.o. female presenting with rash. The history is provided by the patient. No language interpreter was used.  Rash  This is a new problem. The current episode started more than 1 week ago. The problem has been rapidly worsening. The problem is associated with nothing. There has been no fever. The rash is present on the right arm and left arm. The pain is at a severity of 4/10. The pain is mild. She has tried nothing for the symptoms. The treatment provided significant relief.  Pt has an autoimmune type of eczema that makes her break out with severe rash.  Pt has just finished a round of prednisone but rash has returned  Past Medical History  Diagnosis Date  . Psoriasiform dermatitis   . Asthma     Past Surgical History  Procedure Date  . Nose surgery   . Tonsillectomy   . Tubal ligation     History reviewed. No pertinent family history.  History  Substance Use Topics  . Smoking status: Current Everyday Smoker -- 0.5 packs/day    Types: Cigarettes  . Smokeless tobacco: Not on file  . Alcohol Use: Not on file    OB History    Grav Para Term Preterm Abortions TAB SAB Ect Mult Living                  Review of Systems  Skin: Positive for rash.  All other systems reviewed and are negative.    Allergies  Acitretin; Oxycodone-aspirin; and Statins  Home Medications   Current Outpatient Rx  Name Route Sig Dispense Refill  . ALBUTEROL SULFATE HFA 108 (90 BASE) MCG/ACT IN AERS Inhalation Inhale 2 puffs into the lungs every 6 (six) hours as needed.    [name] ESCITALOPRAM OXALATE 20 MG PO TABS Oral Take 20 mg by mouth daily.    [name] HYDROXYZINE HCL 10 MG PO TABS  Take 1 to 2 tablets up to every  6 hours as needed. 100 tablet 0  . ROPINIROLE HCL 2 MG PO TABS Oral Take 2 mg by mouth at bedtime.    [name] TIOTROPIUM BROMIDE MONOHYDRATE 18 MCG IN CAPS Inhalation Place 18 mcg into inhaler and inhale daily.      BP 141/74  Pulse 89  Temp(Src) 98.3 F (36.8 C) (Oral)  Resp 18  SpO2 97%  Physical Exam  Nursing note and vitals reviewed. Constitutional: She appears well-developed and well-nourished.  HENT:  Head: Normocephalic and atraumatic.  Right Ear: External ear normal.  Eyes: Conjunctivae are normal. Pupils are equal, round, and reactive to light.  Neck: Normal range of motion.  Cardiovascular: Normal rate.   Pulmonary/Chest: Effort normal.  Musculoskeletal: Normal range of motion.  Neurological: She is alert.  Skin: Skin is warm. Rash noted.       Erythema hands,  Open areas from scratching,    Psychiatric: She has a normal mood and affect.    ED Course  Procedures (including critical care time)  Labs Reviewed - No data to display No results found.   No diagnosis found.    MDM  Pt given solumedrol here,  Rx for prednisone and bactrim   I am concerned about pt developing cellulitis.  Lonia Skinner [place], [place] 10/09/11 2116  Lonia Skinner [place], [place] 10/09/11 2117

## 2011-10-09 NOTE — Discharge Instructions (Signed)

## 2011-10-10 NOTE — ED Provider Notes (Signed)
History/physical exam/procedure(s) were performed by non-physician practitioner and as supervising physician I was immediately available for consultation/collaboration. I have reviewed all notes and am in agreement with care and plan.   Taeler Winning S Quadir Muns, MD 10/10/11 1943 

## 2011-11-09 ENCOUNTER — Ambulatory Visit (INDEPENDENT_AMBULATORY_CARE_PROVIDER_SITE_OTHER): Payer: Self-pay | Admitting: Family Medicine

## 2011-11-09 VITALS — BP 134/82 | HR 78 | Temp 98.4°F | Resp 18 | Ht 61.5 in | Wt 154.0 lb

## 2011-11-09 DIAGNOSIS — L299 Pruritus, unspecified: Secondary | ICD-10-CM

## 2011-11-09 DIAGNOSIS — L241 Irritant contact dermatitis due to oils and greases: Secondary | ICD-10-CM

## 2011-11-09 DIAGNOSIS — L308 Other specified dermatitis: Secondary | ICD-10-CM

## 2011-11-09 MED ORDER — PREDNISONE 20 MG PO TABS: ORAL_TABLET | ORAL | Status: DC

## 2011-11-09 MED ORDER — DOXYCYCLINE HYCLATE 100 MG PO CAPS: 100.0000 mg | ORAL_CAPSULE | Freq: Two times a day (BID) | ORAL | Status: DC

## 2011-11-09 MED ORDER — METHYLPREDNISOLONE ACETATE 80 MG/ML IJ SUSP
80.0000 mg | Freq: Once | INTRAMUSCULAR | Status: AC
  Administered 2011-11-09: 80 mg via INTRAMUSCULAR

## 2011-11-09 MED ORDER — HYDROXYZINE HCL 10 MG PO TABS: ORAL_TABLET | ORAL | Status: DC

## 2011-11-09 NOTE — Progress Notes (Signed)
Patient Name: Tasha [name] Date of Birth: 1950/12/04 Medical Record Number: [id number] Gender: female Date of Encounter: 11/09/2011  History of Present Illness:  Tasha [name] is a 61 y.o. very pleasant female patient who presents with the following:  History of psoriaform eczema.  She has been having increased symptoms for the last few months.   She has been seen by dermatology for this issue, but has not been seen in a little while.  She has been on Humira and Stelara in the past- however she did not notice a lot of difference.  She stopped these medications due to expense and lack of improvement.  She flared up severely again over the last couple of weeks.   She has been treated here in April and then at the ED about a month ago for the same with IM and po steroids, as well as atarax and antibiotics.   Patient Active Problem List  Diagnoses  . HYPERLIPIDEMIA  . DEPRESSION  . ASTHMA  . NABOTHIAN CYST  . ENDOMETRIAL POLYP  . Contact Dermatitis and Other Eczema, due to Unspecified Cause  . PSORIASIS  . SCOLIOSIS  . NAUSEA ALONE  . HYPERGLYCEMIA  . ELEVATED BP READING WITHOUT DX HYPERTENSION  . COLONIC POLYPS, HX OF   Past Medical History  Diagnosis Date  . Psoriasiform dermatitis   . Asthma    Past Surgical History  Procedure Date  . Nose surgery   . Tonsillectomy   . Tubal ligation    History  Substance Use Topics  . Smoking status: Current Everyday Smoker -- 0.5 packs/day    Types: Cigarettes  . Smokeless tobacco: Not on file  . Alcohol Use: Not on file   No family history on file. Allergies  Allergen Reactions  . Acitretin Swelling and Rash  . Oxycodone-Aspirin   . Statins     Medication list has been reviewed and updated.  Prior to Admission medications   Medication Sig Start Date End Date Taking? Authorizing Provider  albuterol (PROVENTIL HFA;VENTOLIN HFA) 108 (90 BASE) MCG/ACT inhaler Inhale 2 puffs into the lungs every 6 (six) hours as needed.    Yes Historical Provider, MD  escitalopram (LEXAPRO) 20 MG tablet Take 20 mg by mouth daily.   Yes Historical Provider, MD  hydrOXYzine (ATARAX/VISTARIL) 10 MG tablet Take 1 to 2 tablets up to every 6 hours as needed. 09/23/11  Yes Shade Flood, MD  rOPINIRole (REQUIP) 2 MG tablet Take 2 mg by mouth at bedtime.   Yes Historical Provider, MD  tiotropium (SPIRIVA) 18 MCG inhalation capsule Place 18 mcg into inhaler and inhale daily.   Yes Historical Provider, MD  predniSONE (DELTASONE) 10 MG tablet 6,6,5,5,4,4,3,3,2,2,1,1 taper 10/09/11   Elson Areas, PA    Review of Systems:  As per HPI- otherwise negative.   Physical Examination: Filed Vitals:   11/09/11 0836  BP: 134/82  Pulse: 78  Temp: 98.4 F (36.9 C)  Resp: 18   Filed Vitals:   11/09/11 0836  Height: 5' 1.5" (1.562 m)  Weight: 154 lb (69.854 kg)   Body mass index is 28.63 kg/(m^2).  GEN: WDWN, NAD, Non-toxic, A & O x 3, overweight HEENT: Atraumatic, Normocephalic. Neck supple. No masses, No LAD. Ears and Nose: No external deformity. CV: RRR, No M/G/R. No JVD. No thrill. No extra heart sounds. PULM: CTA B, no wheezes, crackles, rhonchi. No retractions. No resp. distress. No accessory muscle use. EXTR: No c/c/e NEURO Normal gait.  PSYCH: Normally  interactive. Conversant. Not depressed or anxious appearing.  Calm demeanor.  Skin: severe scaling and peeling rash, worse on arms and hands.  Fingers are swollen, there is some weeping and cracking of the skin.  Milder rash on neck and back.     Assessment and Plan: 1. Itching    2. Psoriasiform eczema  methylPREDNISolone acetate (DEPO-MEDROL) injection 80 mg, predniSONE (DELTASONE) 20 MG tablet, hydrOXYzine (ATARAX/VISTARIL) 10 MG tablet, doxycycline (VIBRAMYCIN) 100 MG capsule   Persistent, severe psoriasiform eczema- call Dr. Jorja Loa today and schedule an appointment!  Will temporize with prednisone as above.  This will be her third course of treatment in the last 3  months.  It seems Dr. Jorja Loa had suggested methotrexate in the past, but she was hesitant to start this medication.  However, at this time her symptoms are so bad that she may reconsider.    Patient (or parent if minor) instructed to return to clinic or call if not better in 2-3 day(s).  Avoid sun, keep cool, moisturize skin.    Abbe Amsterdam, MD

## 2011-11-16 DIAGNOSIS — L408 Other psoriasis: Secondary | ICD-10-CM | POA: Diagnosis not present

## 2011-11-29 DIAGNOSIS — L080 Pyoderma: Secondary | ICD-10-CM | POA: Diagnosis not present

## 2011-11-29 DIAGNOSIS — L408 Other psoriasis: Secondary | ICD-10-CM | POA: Diagnosis not present

## 2011-11-29 DIAGNOSIS — B958 Unspecified staphylococcus as the cause of diseases classified elsewhere: Secondary | ICD-10-CM | POA: Diagnosis not present

## 2012-03-10 DIAGNOSIS — I82409 Acute embolism and thrombosis of unspecified deep veins of unspecified lower extremity: Secondary | ICD-10-CM | POA: Insufficient documentation

## 2012-03-18 ENCOUNTER — Encounter (HOSPITAL_COMMUNITY): Payer: Self-pay

## 2012-03-18 ENCOUNTER — Emergency Department (HOSPITAL_COMMUNITY)
Admission: EM | Admit: 2012-03-18 | Discharge: 2012-03-18 | Disposition: A | Discharge status: Home / Self Care | Payer: Medicare Other | Attending: Emergency Medicine | Admitting: Emergency Medicine

## 2012-03-18 ENCOUNTER — Ambulatory Visit (INDEPENDENT_AMBULATORY_CARE_PROVIDER_SITE_OTHER): Payer: Medicare Other | Admitting: Family Medicine

## 2012-03-18 ENCOUNTER — Ambulatory Visit (HOSPITAL_COMMUNITY)
Admission: RE | Admit: 2012-03-18 | Discharge: 2012-03-18 | Disposition: A | Discharge status: Home / Self Care | Payer: Medicare Other | Source: Ambulatory Visit | Attending: Family Medicine | Admitting: Family Medicine

## 2012-03-18 VITALS — BP 130/74 | HR 103 | Temp 98.5°F | Resp 18 | Ht 61.5 in | Wt 156.0 lb

## 2012-03-18 DIAGNOSIS — L42 Pityriasis rosea: Secondary | ICD-10-CM | POA: Diagnosis not present

## 2012-03-18 DIAGNOSIS — L408 Other psoriasis: Secondary | ICD-10-CM | POA: Diagnosis not present

## 2012-03-18 DIAGNOSIS — J45909 Unspecified asthma, uncomplicated: Secondary | ICD-10-CM | POA: Insufficient documentation

## 2012-03-18 DIAGNOSIS — I824Y9 Acute embolism and thrombosis of unspecified deep veins of unspecified proximal lower extremity: Secondary | ICD-10-CM | POA: Insufficient documentation

## 2012-03-18 DIAGNOSIS — G2581 Restless legs syndrome: Secondary | ICD-10-CM

## 2012-03-18 DIAGNOSIS — M79609 Pain in unspecified limb: Secondary | ICD-10-CM | POA: Diagnosis not present

## 2012-03-18 DIAGNOSIS — F17200 Nicotine dependence, unspecified, uncomplicated: Secondary | ICD-10-CM | POA: Insufficient documentation

## 2012-03-18 DIAGNOSIS — M79604 Pain in right leg: Secondary | ICD-10-CM

## 2012-03-18 DIAGNOSIS — Z886 Allergy status to analgesic agent status: Secondary | ICD-10-CM | POA: Diagnosis not present

## 2012-03-18 DIAGNOSIS — Z888 Allergy status to other drugs, medicaments and biological substances status: Secondary | ICD-10-CM | POA: Diagnosis not present

## 2012-03-18 DIAGNOSIS — M79605 Pain in left leg: Secondary | ICD-10-CM

## 2012-03-18 DIAGNOSIS — I82402 Acute embolism and thrombosis of unspecified deep veins of left lower extremity: Secondary | ICD-10-CM

## 2012-03-18 DIAGNOSIS — Z79899 Other long term (current) drug therapy: Secondary | ICD-10-CM | POA: Diagnosis not present

## 2012-03-18 DIAGNOSIS — I82409 Acute embolism and thrombosis of unspecified deep veins of unspecified lower extremity: Secondary | ICD-10-CM | POA: Diagnosis not present

## 2012-03-18 DIAGNOSIS — L308 Other specified dermatitis: Secondary | ICD-10-CM

## 2012-03-18 MED ORDER — HYDROXYZINE HCL 10 MG PO TABS: ORAL_TABLET | ORAL | Status: DC

## 2012-03-18 MED ORDER — RIVAROXABAN 15 MG PO TABS: 15.0000 mg | ORAL_TABLET | Freq: Two times a day (BID) | ORAL | Status: DC

## 2012-03-18 MED ORDER — ROPINIROLE HCL 4 MG PO TABS: 4.0000 mg | ORAL_TABLET | Freq: Every day | ORAL | Status: DC

## 2012-03-18 MED ORDER — ALBUTEROL SULFATE HFA 108 (90 BASE) MCG/ACT IN AERS: 2.0000 | INHALATION_SPRAY | Freq: Four times a day (QID) | RESPIRATORY_TRACT | Status: DC | PRN

## 2012-03-18 MED ORDER — TIOTROPIUM BROMIDE MONOHYDRATE 18 MCG IN CAPS: 18.0000 ug | ORAL_CAPSULE | Freq: Every day | RESPIRATORY_TRACT | Status: DC

## 2012-03-18 MED ORDER — MOMETASONE FURO-FORMOTEROL FUM 200-5 MCG/ACT IN AERO: 2.0000 | INHALATION_SPRAY | Freq: Two times a day (BID) | RESPIRATORY_TRACT | Status: DC

## 2012-03-18 MED ORDER — ESCITALOPRAM OXALATE 20 MG PO TABS: 20.0000 mg | ORAL_TABLET | Freq: Every day | ORAL | Status: DC

## 2012-03-18 NOTE — Patient Instructions (Addendum)
Restless Legs Syndrome Restless legs syndrome is a movement disorder. It may also be called a sensori-motor disorder.  CAUSES  No one knows what specifically causes restless legs syndrome, but it tends to run in families. It is also more common in people with low iron, in pregnancy, in people who need dialysis, and those with nerve damage (neuropathy).Some medications may make restless legs syndrome worse.Those medications include drugs to treat high blood pressure, some heart conditions, nausea, colds, allergies, and depression. SYMPTOMS Symptoms include uncomfortable sensations in the legs. These leg sensations are worse during periods of inactivity or rest. They are also worse while sitting or lying down. Individuals that have the disorder describe sensations in the legs that feel like:  Pulling.  Drawing.  Crawling.  Worming.  Boring.  Tingling.  Pins and needles.  Prickling.  Pain. The sensations are usually accompanied by an overwhelming urge to move the legs. Sudden muscle jerks may also occur. Movement provides temporary relief from the discomfort. In rare cases, the arms may also be affected. Symptoms may interfere with going to sleep (sleep onset insomnia). Restless legs syndrome may also be related to periodic limb movement disorder (PLMD). PLMD is another more common motor disorder. It also causes interrupted sleep. The symptoms from PLMD usually occur most often when you are awake. TREATMENT  Treatment for restless legs syndrome is symptomatic. This means that the symptoms are treated.   Massage and cold compresses may provide temporary relief.  Walk, stretch, or take a cold or hot bath.  Get regular exercise and a good night's sleep.  Avoid caffeine, alcohol, nicotine, and medications that can make it worse.  Do activities that provide mental stimulation like discussions, needlework, and video games. These may be helpful if you are not able to walk or  stretch. Some medications are effective in relieving the symptoms. However, many of these medications have side effects. Ask your caregiver about medications that may help your symptoms. Correcting iron deficiency may improve symptoms for some patients. Document Released: 05/07/2002 Document Revised: 08/09/2011 Document Reviewed: 08/13/2010 [employer] Patient Information 2013 [employer].  

## 2012-03-18 NOTE — Progress Notes (Signed)
61 yo woman with chronic severe psoriasis unresponsive to [facility] and only mildly responsive to clobetasol 0.05% cream who comes in with leg pains.  Objective:  NAD  Marked eczematous changes on palms with patchy psoriatic changes legs, left back  Both medial thighs have subcutaneous, tender lumpy areas along the pathway of the hamstrings and saphenous vein  Assessment:  Most consistent with tendonitis, but I cannot rule  Out DVT  Plan:  Venous doppler   1. Psoriasiform eczema  hydrOXYzine (ATARAX/VISTARIL) 10 MG tablet  2. Restless legs  escitalopram (LEXAPRO) 20 MG tablet, rOPINIRole (REQUIP) 4 MG tablet  3. Asthma  albuterol (PROVENTIL HFA;VENTOLIN HFA) 108 (90 BASE) MCG/ACT inhaler, tiotropium (SPIRIVA) 18 MCG inhalation capsule, Mometasone Furo-Formoterol Fum 200-5 MCG/ACT AERO   Patient's asthma is been well-controlled with the Spiriva but the prescriptions cost $500 each month. She still has some insurance coverage but she's worried that she's going to fall into the "doughnut hole" soon. For this reason I prescribed to learn given her a coupon because I have coupons for the superior  We discussed her restless legs. She noticed that at night when she lies down to legs jump up right off the bed periodically. She's been on ropinirole which has helped her somewhat but does not completely control the spasms in her legs. She would like to try a higher dose.

## 2012-03-18 NOTE — Progress Notes (Signed)
VASCULAR LAB PRELIMINARY  PRELIMINARY  PRELIMINARY  PRELIMINARY  Bilateral lower extremity venous Dopplers completed.    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  All other vessels appear thrombus free.  Tasha [name], 03/18/2012, 5:14 PM

## 2012-03-18 NOTE — ED Provider Notes (Signed)
History  This chart was scribed for Dione Booze, MD by Bennett Scrape. This patient was seen in room TR05C/TR05C and the patient's care was started at 5:27PM.  CSN: [id number]  Arrival date & time 03/18/12  1709   First MD Initiated Contact with Patient 03/18/12 1727      Chief Complaint  Patient presents with  . Leg Pain     The history is provided by the patient. No language interpreter was used.   Tasha [name] is a 61 y.o. female with a h/o DVTs who presents to the Emergency Department complaining of one month of gradual onset and constant bilateral posterior calf pain described as soreness that has gradually worsened for the past 2 days. She rates her pain a 1 out of 10 and states that the pain is worse with touch. She was seen by her PCP today and sent her for a doppler of her left lower leg. She denies CP, abdominal pain, and SOB as associated symptoms. She has a h/o asthma and is a current everyday smoker.  PCP is [place].  Past Medical History  Diagnosis Date  . Psoriasiform dermatitis   . Asthma     Past Surgical History  Procedure Date  . Nose surgery   . Tonsillectomy   . Tubal ligation     History reviewed. No pertinent family history.  History  Substance Use Topics  . Smoking status: Current Every Day Smoker -- 0.5 packs/day    Types: Cigarettes  . Smokeless tobacco: Not on file  . Alcohol Use: Not on file    No OB history provided.  Review of Systems  Respiratory: Negative for shortness of breath.   Cardiovascular: Negative for chest pain.  Gastrointestinal: Negative for abdominal pain.  Musculoskeletal: Negative for back pain.       Positive for bilateral leg pain  All other systems reviewed and are negative.    Allergies  Acitretin; Oxycodone-aspirin; and Statins  Home Medications   Current Outpatient Rx  Name Route Sig Dispense Refill  . ALBUTEROL SULFATE HFA 108 (90 BASE) MCG/ACT IN AERS Inhalation Inhale 2 puffs into the lungs  every 6 (six) hours as needed. For shortness of breath/wheezing    . CLOBETASOL PROPIONATE 0.05 % EX CREA Topical Apply 1 application topically 2 (two) times daily.    [name] ESCITALOPRAM OXALATE 20 MG PO TABS Oral Take 1 tablet (20 mg total) by mouth daily. 90 tablet 3  . HYDROXYZINE HCL 10 MG PO TABS Oral Take 10-20 mg by mouth every 6 (six) hours as needed. For itching    . MOMETASONE FURO-FORMOTEROL FUM 200-5 MCG/ACT IN AERO Inhalation Inhale 2 puffs into the lungs 2 (two) times daily.    [name] ROPINIROLE HCL 4 MG PO TABS Oral Take 4 mg by mouth at bedtime.    [name] TIOTROPIUM BROMIDE MONOHYDRATE 18 MCG IN CAPS Inhalation Place 1 capsule (18 mcg total) into inhaler and inhale daily. 30 capsule 11    Triage Vitals: BP 144/88  Pulse 96  Temp 98.4 F (36.9 C) (Oral)  Resp 18  SpO2 96%  Physical Exam  Nursing note and vitals reviewed. Constitutional: She is oriented to person, place, and time. She appears well-developed and well-nourished. No distress.  HENT:  Head: Normocephalic and atraumatic.  Eyes: EOM are normal.  Neck: Neck supple. No tracheal deviation present.  Cardiovascular: Normal rate.   Pulmonary/Chest: Effort normal. No respiratory distress.  Musculoskeletal: Normal range of motion.  Left calf circumference is 1 cm greater than the right calf circumference, no chords, no erythema, no warmth  Neurological: She is alert and oriented to person, place, and time.  Skin: Skin is warm and dry.       Skin lesions consistent with psoriasis on bilateral hands  Psychiatric: She has a normal mood and affect. Her behavior is normal.    ED Course  Procedures (including critical care time)  DIAGNOSTIC STUDIES: Oxygen Saturation is 96% on room air, adequate by my interpretation.    COORDINATION OF CARE: 5:43PM-Informed pt of [place] report showing a blood clot. Discussed discharge plan of blood thinners with pt at bedside and pt agreed to plan. Pt decided on xarelto treatment.   Progress  Notes     KANADY, CANDACE 03/18/2012 5:15 PM Signed  VASCULAR LAB  PRELIMINARY PRELIMINARY PRELIMINARY PRELIMINARY  Bilateral lower extremity venous Dopplers completed.  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 All other vessels appear thrombus free.  KANADY, CANDACE,  03/18/2012, 5:14 PM      1. Left leg DVT       MDM  DVT. She will need to be treated with anticoagulation. Options of Lovenox/Warfarin vs Rivaroxoban were discussed, and she opted for Rivaroxoban. Prescription is written.      I personally performed the services described in this documentation, which was scribed in my presence. The recorded information has been reviewed and considered.      Dione Booze, MD 03/18/12 [phone]

## 2012-03-18 NOTE — ED Notes (Signed)
Pt reports (L) posterior leg pain with touching only, pt first noticed this pain approx 1 month ago, pt also reports restless legs w/sleeping, sts "my (R) leg just comes off the bed." Pt sent here from [facility].

## 2012-03-18 NOTE — ED Notes (Signed)
Pt has hx of DVT in left leg and reports she feels a lump in left leg and the other day she noticed she had one in right leg and is afraid she has a DVT or cancer in right leg.

## 2012-03-20 ENCOUNTER — Telehealth: Payer: Self-pay

## 2012-03-20 NOTE — Telephone Encounter (Signed)
PT REQUESTING CALL BACK FROM DR Milus Glazier RE % OF OCCLUSION  AND MAY SHE TAKE ASPIRIN INSTEAD OF ANOTHER ALTERNATIVE??  BEST PHONE [phone]

## 2012-03-20 NOTE — Telephone Encounter (Signed)
patient notified and voiced understanding. 

## 2012-03-20 NOTE — Telephone Encounter (Signed)
The anticoagulants are used because aspirin does not work for this.  Without anticoagulants, a clot may continue to grow, break off and travel to the lung.

## 2012-04-05 ENCOUNTER — Ambulatory Visit (INDEPENDENT_AMBULATORY_CARE_PROVIDER_SITE_OTHER): Payer: Medicare Other | Admitting: Family Medicine

## 2012-04-05 VITALS — BP 124/78 | HR 84 | Temp 98.1°F | Resp 20 | Ht 61.5 in | Wt 154.0 lb

## 2012-04-05 DIAGNOSIS — I82A12 Acute embolism and thrombosis of left axillary vein: Secondary | ICD-10-CM

## 2012-04-05 DIAGNOSIS — R599 Enlarged lymph nodes, unspecified: Secondary | ICD-10-CM

## 2012-04-05 DIAGNOSIS — I82A19 Acute embolism and thrombosis of unspecified axillary vein: Secondary | ICD-10-CM | POA: Diagnosis not present

## 2012-04-05 MED ORDER — PENICILLIN V POTASSIUM 500 MG PO TABS: 500.0000 mg | ORAL_TABLET | Freq: Three times a day (TID) | ORAL | Status: DC

## 2012-04-05 MED ORDER — RIVAROXABAN 20 MG PO TABS: 20.0000 mg | ORAL_TABLET | Freq: Every day | ORAL | Status: DC

## 2012-04-05 NOTE — Patient Instructions (Addendum)
Swollen Lymph Nodes The lymphatic system filters fluid from around cells. It is like a system of blood vessels. These channels carry lymph instead of blood. The lymphatic system is an important part of the immune (disease fighting) system. When people talk about "swollen glands in the neck," they are usually talking about swollen lymph nodes. The lymph nodes are like the little traps for infection. You and your caregiver may be able to feel lymph nodes, especially swollen nodes, in these common areas: the groin (inguinal area), armpits (axilla), and above the clavicle (supraclavicular). You may also feel them in the neck (cervical) and the back of the head just above the hairline (occipital). Swollen glands occur when there is any condition in which the body responds with an allergic type of reaction. For instance, the glands in the neck can become swollen from insect bites or any type of minor infection on the head. These are very noticeable in children with only minor problems. Lymph nodes may also become swollen when there is a tumor or problem with the lymphatic system, such as Hodgkin's disease. TREATMENT   Most swollen glands do not require treatment. They can be observed (watched) for a short period of time, if your caregiver feels it is necessary. Most of the time, observation is not necessary.  Antibiotics (medicines that kill germs) may be prescribed by your caregiver. Your caregiver may prescribe these if he or she feels the swollen glands are due to a bacterial (germ) infection. Antibiotics are not used if the swollen glands are caused by a virus. HOME CARE INSTRUCTIONS   Take medications as directed by your caregiver. Only take over-the-counter or prescription medicines for pain, discomfort, or fever as directed by your caregiver. SEEK MEDICAL CARE IF:   If you begin to run a temperature greater than 102 F (38.9 C), or as your caregiver suggests. MAKE SURE YOU:   Understand these  instructions.  Will watch your condition.  Will get help right away if you are not doing well or get worse. Document Released: 05/07/2002 Document Revised: 08/09/2011 Document Reviewed: 05/17/2005 [facility] Patient Information 2013 [place], [place].  Coupon number to call Call [phone]  8a to 8p

## 2012-04-05 NOTE — Progress Notes (Signed)
  Subjective:    Patient ID: Tasha [name], female    DOB: 1951-04-03, 61 y.o.   MRN: [id number]  HPI Patient presents today with complaints of swollen glands on the right side. This started on Sunday. She states that at night time it feels like it is throbbing. She has never has any problems with Penicillin.    Review of Systems     Objective:   Physical Exam Tender left medial thigh 1.5 cm right submandibular node, mildly tender oroph clear Abdomen:  No HSM, nontendre TM's normal No thyromegaly       Assessment & Plan:  Xarelto 20 mg daily 1. Glands swollen  penicillin v potassium (VEETID) 500 MG tablet  2. DVT of left axillary vein, acute        Call [phone]  8a to 8p

## 2012-04-07 ENCOUNTER — Telehealth: Payer: Self-pay

## 2012-04-07 NOTE — Telephone Encounter (Signed)
Patient believes that she is having a reaction to her antibodic that she started Wednesday very dark urine and kidney pain   Best [phone]

## 2012-04-10 ENCOUNTER — Ambulatory Visit (INDEPENDENT_AMBULATORY_CARE_PROVIDER_SITE_OTHER): Payer: Medicare Other | Admitting: Family Medicine

## 2012-04-10 ENCOUNTER — Encounter: Payer: Self-pay | Admitting: Family Medicine

## 2012-04-10 VITALS — BP 124/80 | HR 84 | Temp 98.5°F | Resp 16 | Ht 61.0 in | Wt 155.6 lb

## 2012-04-10 DIAGNOSIS — I82A12 Acute embolism and thrombosis of left axillary vein: Secondary | ICD-10-CM

## 2012-04-10 DIAGNOSIS — R109 Unspecified abdominal pain: Secondary | ICD-10-CM

## 2012-04-10 DIAGNOSIS — J029 Acute pharyngitis, unspecified: Secondary | ICD-10-CM | POA: Diagnosis not present

## 2012-04-10 DIAGNOSIS — I82409 Acute embolism and thrombosis of unspecified deep veins of unspecified lower extremity: Secondary | ICD-10-CM | POA: Diagnosis not present

## 2012-04-10 DIAGNOSIS — R1013 Epigastric pain: Secondary | ICD-10-CM

## 2012-04-10 DIAGNOSIS — Z23 Encounter for immunization: Secondary | ICD-10-CM

## 2012-04-10 DIAGNOSIS — G2581 Restless legs syndrome: Secondary | ICD-10-CM

## 2012-04-10 DIAGNOSIS — L409 Psoriasis, unspecified: Secondary | ICD-10-CM

## 2012-04-10 DIAGNOSIS — E785 Hyperlipidemia, unspecified: Secondary | ICD-10-CM

## 2012-04-10 MED ORDER — RIVAROXABAN 20 MG PO TABS: 20.0000 mg | ORAL_TABLET | Freq: Every day | ORAL | Status: DC

## 2012-04-10 NOTE — Telephone Encounter (Signed)
She is coming in today to see Dr L.at appt center.

## 2012-04-10 NOTE — Progress Notes (Signed)
Patient presents with several issues: 1. Restless legs. Patient had one good night after taking the ropinirole than the jumping legs resumed. She's in the process trying out the iron 2. Patient's sore throat has resolved. He still has swollen glands in the right neck. She stopped taking the penicillin because she developed back pain  3. Patient says that her left leg pain is completely resolved. She sleeps with a pillow between her legs and is comfortable other than the restless legs 4. Patient has chronic epigastric pain which occurs intermittently and is mild. It's been treated by Dr. Patsy Lager in the past successfully with Sucralfate prn.   Discomfort occurs only when supine.  Other that these problems, she is stable.  The eczema/psoriasis is better today than last time.   Objective:  NAD, gait normal, no motor weakness or tremor Oroph:  S/P tonsillectomy.  Throat is read without swelling or exudates Neck:  Supple, raspy voice, mild adenopathy on right which is mildly tender in the anterior cervical region Abdomen:  Soft, no HSM, nontender Skin:  Eczematous changes diffusely  Assessment: 1. Restless legs.  We are in the process of our second intervention 2. Mild pharyngitis 3. DVT under successful treatment for 6 months 4. Epigastric fullness, most likely GERD 1. Psoriasis  US Abdomen Complete  2. Hyperlipidemia  US Abdomen Complete  3. DVT (deep venous thrombosis)  US Abdomen Complete  4. Abdominal pain  US Abdomen Complete  5. Sore throat  Culture, Group A Strep  6. Immunization due    7. DVT of left axillary vein, acute  Rivaroxaban (XARELTO) 20 MG TABS

## 2012-04-10 NOTE — Telephone Encounter (Signed)
Left message for her. She needs to return to clinic for this// soon.

## 2012-04-13 LAB — CULTURE, GROUP A STREP: Organism ID, Bacteria: NORMAL

## 2012-04-14 ENCOUNTER — Other Ambulatory Visit: Payer: Medicare Other

## 2012-04-17 ENCOUNTER — Other Ambulatory Visit: Payer: Self-pay | Admitting: Family Medicine

## 2012-04-17 ENCOUNTER — Ambulatory Visit
Admission: RE | Admit: 2012-04-17 | Discharge: 2012-04-17 | Disposition: A | Discharge status: Home / Self Care | Payer: Medicare Other | Source: Ambulatory Visit | Attending: Family Medicine | Admitting: Family Medicine

## 2012-04-17 DIAGNOSIS — K824 Cholesterolosis of gallbladder: Secondary | ICD-10-CM | POA: Diagnosis not present

## 2012-04-17 DIAGNOSIS — L409 Psoriasis, unspecified: Secondary | ICD-10-CM

## 2012-04-17 DIAGNOSIS — N281 Cyst of kidney, acquired: Secondary | ICD-10-CM | POA: Diagnosis not present

## 2012-04-17 DIAGNOSIS — I82409 Acute embolism and thrombosis of unspecified deep veins of unspecified lower extremity: Secondary | ICD-10-CM

## 2012-04-17 DIAGNOSIS — R109 Unspecified abdominal pain: Secondary | ICD-10-CM

## 2012-04-17 DIAGNOSIS — E785 Hyperlipidemia, unspecified: Secondary | ICD-10-CM

## 2012-04-18 ENCOUNTER — Other Ambulatory Visit: Payer: Self-pay | Admitting: Radiology

## 2012-04-18 DIAGNOSIS — K829 Disease of gallbladder, unspecified: Secondary | ICD-10-CM

## 2012-04-19 ENCOUNTER — Telehealth: Payer: Self-pay

## 2012-04-19 MED ORDER — SUCRALFATE 1 G PO TABS: 1.0000 g | ORAL_TABLET | Freq: Four times a day (QID) | ORAL | Status: DC

## 2012-04-19 NOTE — Telephone Encounter (Signed)
Patient wants a renewal on Carafate 1gm tablets, she took this last year for nausea, and it has been helpful, she only has 5 pills left, wants sent to [facility]. Pended Rx pls advise. She has been advised of the [place] report and referral has been made for general surgery

## 2012-04-19 NOTE — Telephone Encounter (Signed)
Pt states that she was speaking with Amy earlier regarding her consultation with [place] [facility], pt states that she would like to see Dr.Streck if possible. Best# [phone]

## 2012-04-25 ENCOUNTER — Other Ambulatory Visit: Payer: Self-pay | Admitting: Family Medicine

## 2012-04-25 DIAGNOSIS — Z1231 Encounter for screening mammogram for malignant neoplasm of breast: Secondary | ICD-10-CM

## 2012-05-01 ENCOUNTER — Encounter (INDEPENDENT_AMBULATORY_CARE_PROVIDER_SITE_OTHER): Payer: Self-pay | Admitting: Surgery

## 2012-05-01 ENCOUNTER — Other Ambulatory Visit (INDEPENDENT_AMBULATORY_CARE_PROVIDER_SITE_OTHER): Payer: Self-pay | Admitting: Surgery

## 2012-05-01 ENCOUNTER — Ambulatory Visit (INDEPENDENT_AMBULATORY_CARE_PROVIDER_SITE_OTHER): Payer: Medicare Other | Admitting: Surgery

## 2012-05-01 VITALS — BP 134/82 | HR 86 | Temp 97.5°F | Ht 61.0 in | Wt 152.2 lb

## 2012-05-01 DIAGNOSIS — R1013 Epigastric pain: Secondary | ICD-10-CM | POA: Diagnosis not present

## 2012-05-01 DIAGNOSIS — K8020 Calculus of gallbladder without cholecystitis without obstruction: Secondary | ICD-10-CM

## 2012-05-01 DIAGNOSIS — R109 Unspecified abdominal pain: Secondary | ICD-10-CM

## 2012-05-01 NOTE — Progress Notes (Signed)
Patient ID: Tasha [name], female   DOB: 1950/08/21, 61 y.o.   MRN: [id number]  Chief Complaint  Patient presents with  . Other    gallbladder polyp    HPI Tasha [name] is a 61 y.o. female.   HPI She is referred by Dr. Milus Glazier for evaluation of epigastric abdominal pain. This has been going on for over a year. Originally it improved with medications. She recently has had 2 attacks or she also had nausea and vomiting. She did not have right upper quadrant abdominal pain but did have some back pain. She also had one episode of dark urine. Today she is pain free Past Medical History  Diagnosis Date  . Psoriasiform dermatitis   . Asthma   . Allergy   . Arthritis   . Depression   . GERD (gastroesophageal reflux disease)   . Hyperlipidemia   . Anxiety   . Clotting disorder     Past Surgical History  Procedure Date  . Nose surgery   . Tonsillectomy   . Tubal ligation     Family History  Problem Relation Age of Onset  . Cancer Mother     colon  . Heart disease Father   . Cancer Maternal Grandfather     lung  . Cancer Maternal Aunt     breast    Social History History  Substance Use Topics  . Smoking status: Current Every Day Smoker -- 1.0 packs/day    Types: Cigarettes  . Smokeless tobacco: Not on file  . Alcohol Use: No    Allergies  Allergen Reactions  . Acitretin Swelling and Rash  . Oxycodone-Aspirin   . Statins     Current Outpatient Prescriptions  Medication Sig Dispense Refill  . albuterol (PROVENTIL HFA;VENTOLIN HFA) 108 (90 BASE) MCG/ACT inhaler Inhale 2 puffs into the lungs every 6 (six) hours as needed. For shortness of breath/wheezing      . escitalopram (LEXAPRO) 20 MG tablet Take 1 tablet (20 mg total) by mouth daily.  90 tablet  3  . hydrOXYzine (ATARAX/VISTARIL) 10 MG tablet Take 10-20 mg by mouth every 6 (six) hours as needed. For itching      . Mometasone Furo-Formoterol Fum 200-5 MCG/ACT AERO Inhale 2 puffs into the lungs 2 (two) times  daily.      . penicillin v potassium (VEETID) 500 MG tablet Take 1 tablet (500 mg total) by mouth 3 (three) times daily.  30 tablet  0  . Rivaroxaban (XARELTO) 20 MG TABS Take 1 tablet (20 mg total) by mouth daily.  30 tablet  6  . rOPINIRole (REQUIP) 4 MG tablet Take 4 mg by mouth at bedtime.      . sucralfate (CARAFATE) 1 G tablet Take 1 tablet (1 g total) by mouth 4 (four) times daily.  60 tablet  0  . tiotropium (SPIRIVA) 18 MCG inhalation capsule Place 1 capsule (18 mcg total) into inhaler and inhale daily.  30 capsule  11  . triamcinolone cream (KENALOG) 0.1 %         Review of Systems Review of Systems  Constitutional: Negative for fever, chills and unexpected weight change.  HENT: Negative for hearing loss, congestion, sore throat, trouble swallowing and voice change.   Eyes: Negative for visual disturbance.  Respiratory: Negative for cough and wheezing.   Cardiovascular: Negative for chest pain, palpitations and leg swelling.  Gastrointestinal: Positive for nausea, vomiting and abdominal pain. Negative for diarrhea, constipation, blood in stool, abdominal distention and  anal bleeding.  Genitourinary: Negative for hematuria, vaginal bleeding and difficulty urinating.  Musculoskeletal: Negative for arthralgias.  Skin: Negative for rash and wound.  Neurological: Negative for seizures, syncope and headaches.  Hematological: Negative for adenopathy. Does not bruise/bleed easily.  Psychiatric/Behavioral: Negative for confusion.  All other systems reviewed and are negative.    Blood pressure 134/82, pulse 86, temperature 97.5 F (36.4 C), temperature source Temporal, height 5\' 1"  (1.549 m), weight 152 lb 3.2 oz (69.037 kg), SpO2 92.00%.  Physical Exam Physical Exam  Constitutional: She is oriented to person, place, and time. She appears well-developed and well-nourished. No distress.  HENT:  Head: Normocephalic and atraumatic.  Right Ear: External ear normal.  Left Ear: External  ear normal.  Nose: Nose normal.  Mouth/Throat: Oropharynx is clear and moist. No oropharyngeal exudate.  Eyes: Conjunctivae normal are normal. Pupils are equal, round, and reactive to light. Right eye exhibits no discharge. Left eye exhibits no discharge. No scleral icterus.  Neck: Normal range of motion. Neck supple. No tracheal deviation present. No thyromegaly present.  Cardiovascular: Normal rate, regular rhythm, normal heart sounds and intact distal pulses.   No murmur heard. Pulmonary/Chest: Effort normal and breath sounds normal. No respiratory distress. She has no wheezes. She has no rales.  Abdominal: Bowel sounds are normal. She exhibits no distension. There is no tenderness. There is no rebound.  Musculoskeletal: Normal range of motion. She exhibits no edema.  Neurological: She is alert and oriented to person, place, and time.  Skin: Skin is warm and dry. No rash noted. She is not diaphoretic. No erythema.  Psychiatric: Her behavior is normal. Judgment normal.    Data Reviewed She has an ultrasound showing a 3 mm gallbladder polyp. There are no gallstones and no gallbladder wall thickening. The bile duct is normal  Assessment    Epigastric abdominal pain of uncertain etiology    Plan    The gallbladder polyp itself would not cause the symptoms. However, I cannot rule out biliary colic. I am going to check a HIDA scan with gallbladder ejection fraction. If this is normal, I would recommend referral to a gastroenterologist for upper endoscopy prior to possible laparoscopic cholecystectomy. I will see her back after the scan       Kervin Bones A 05/01/2012, 11:43 AM

## 2012-05-12 ENCOUNTER — Encounter (HOSPITAL_COMMUNITY)
Admission: RE | Admit: 2012-05-12 | Discharge: 2012-05-12 | Disposition: A | Discharge status: Home / Self Care | Payer: Medicare Other | Source: Ambulatory Visit | Attending: Surgery | Admitting: Surgery

## 2012-05-12 DIAGNOSIS — K8020 Calculus of gallbladder without cholecystitis without obstruction: Secondary | ICD-10-CM | POA: Insufficient documentation

## 2012-05-12 DIAGNOSIS — R109 Unspecified abdominal pain: Secondary | ICD-10-CM

## 2012-05-12 DIAGNOSIS — R1110 Vomiting, unspecified: Secondary | ICD-10-CM | POA: Diagnosis not present

## 2012-05-12 MED ORDER — SINCALIDE 5 MCG IJ SOLR
INTRAMUSCULAR | Status: AC
  Filled 2012-05-12: qty 5

## 2012-05-12 MED ORDER — SINCALIDE 5 MCG IJ SOLR
0.0200 ug/kg | Freq: Once | INTRAMUSCULAR | Status: AC
  Administered 2012-05-12: 1.35 ug via INTRAVENOUS

## 2012-05-12 MED ORDER — TECHNETIUM TC 99M MEBROFENIN IV KIT
5.0000 | PACK | Freq: Once | INTRAVENOUS | Status: AC | PRN
  Administered 2012-05-12: 5 via INTRAVENOUS

## 2012-05-12 MED ORDER — STERILE WATER FOR INJECTION IJ SOLN
10.0000 mL | Freq: Once | INTRAMUSCULAR | Status: AC
  Administered 2012-05-12: 10 mL via INTRAMUSCULAR
  Filled 2012-05-12: qty 10

## 2012-05-16 ENCOUNTER — Ambulatory Visit (INDEPENDENT_AMBULATORY_CARE_PROVIDER_SITE_OTHER): Payer: Medicare Other | Admitting: Surgery

## 2012-05-16 ENCOUNTER — Encounter (INDEPENDENT_AMBULATORY_CARE_PROVIDER_SITE_OTHER): Payer: Self-pay | Admitting: Surgery

## 2012-05-16 VITALS — BP 122/83 | HR 70 | Temp 97.3°F | Resp 12 | Ht 61.0 in | Wt 148.8 lb

## 2012-05-16 DIAGNOSIS — R109 Unspecified abdominal pain: Secondary | ICD-10-CM | POA: Diagnosis not present

## 2012-05-16 NOTE — Progress Notes (Signed)
Subjective:     Patient ID: Tasha [name], female   DOB: [birth year], 61 y.o.   MRN: [id number]  HPI She is here for a followup to discuss her studies. She still has occasional discomfort around the umbilicus she lays down at night. Again, nothing is related to fatty meals  Review of Systems     Objective:   Physical Exam Her abdominal exam is benign today.  Her HIDA scan showed a normal gallbladder ejection fraction and there was no preproduction of symptoms with CCK    Assessment:     Abdominal pain of uncertain etiology.    Plan:     I believe she needs referral to a gastroenterologist for evaluation and possible endoscopy. I did not leave her symptoms are from her gallbladder. I will see her back as needed.

## 2012-05-18 DIAGNOSIS — F17200 Nicotine dependence, unspecified, uncomplicated: Secondary | ICD-10-CM | POA: Diagnosis not present

## 2012-05-18 DIAGNOSIS — Z01419 Encounter for gynecological examination (general) (routine) without abnormal findings: Secondary | ICD-10-CM | POA: Diagnosis not present

## 2012-05-18 DIAGNOSIS — Z124 Encounter for screening for malignant neoplasm of cervix: Secondary | ICD-10-CM | POA: Diagnosis not present

## 2012-05-19 ENCOUNTER — Telehealth: Payer: Self-pay

## 2012-05-19 NOTE — Telephone Encounter (Signed)
Dr Milus Glazier; Patient says that she has three questions for you.    Patient is wanting to know if she needs an appointment with your to follow-up on the GI Surgery.    How long does she need to be on Xarelto?  If you could write her an prescription for the Shingles vaccine.   Best#: [phone] or [place]: [phone]

## 2012-05-20 MED ORDER — ZOSTER VACCINE LIVE 19400 UNT/0.65ML ~~LOC~~ SOLR: 0.6500 mL | Freq: Once | SUBCUTANEOUS | Status: DC

## 2012-05-20 NOTE — Telephone Encounter (Signed)
She should follow up with the general surgeon She needs the Xarelto for 6 months Please call her pharmacy to order the Zostavax (it cannot be ordered on EPIC)

## 2012-05-20 NOTE — Telephone Encounter (Signed)
Pt notified of notes and shingles vaccine sent to pharmacy

## 2012-06-01 ENCOUNTER — Ambulatory Visit
Admission: RE | Admit: 2012-06-01 | Discharge: 2012-06-01 | Disposition: A | Discharge status: Home / Self Care | Payer: Medicare Other | Source: Ambulatory Visit | Attending: Family Medicine | Admitting: Family Medicine

## 2012-06-01 DIAGNOSIS — Z1231 Encounter for screening mammogram for malignant neoplasm of breast: Secondary | ICD-10-CM | POA: Diagnosis not present

## 2012-06-02 ENCOUNTER — Telehealth: Payer: Self-pay

## 2012-06-02 DIAGNOSIS — R109 Unspecified abdominal pain: Secondary | ICD-10-CM

## 2012-06-02 NOTE — Telephone Encounter (Signed)
Patient advised Dr Milus Glazier out of office. I have asked her to call back to clarify exactly what she is in need of.

## 2012-06-02 NOTE — Telephone Encounter (Signed)
Pt calling in regards to referall to a GI doctor, pt states that Dr.Lauenstien was supposed to review the notes sent over from [facility]. Please advise. Best# [phone] Or cell [phone]

## 2012-06-02 NOTE — Telephone Encounter (Signed)
Dr Magnus Ivan did HIDA scan on her, and recommended referral to a GI specialist for her abdominal pain.

## 2012-06-02 NOTE — Telephone Encounter (Signed)
It looks like a referral to gastroenterology may be in the works (see the referrals tab in chart review), but it appears to be incomplete.  We can make the referral, but we shouldn't duplicate what Dr. Magnus Ivan has ordered.  Please investigate and proceed.

## 2012-06-04 NOTE — Telephone Encounter (Signed)
I had begun the referral, have signed, pt knows to bring records form Dr Magnus Ivan.

## 2012-10-05 ENCOUNTER — Ambulatory Visit (INDEPENDENT_AMBULATORY_CARE_PROVIDER_SITE_OTHER): Payer: Medicare Other | Admitting: Family Medicine

## 2012-10-05 ENCOUNTER — Encounter: Payer: Self-pay | Admitting: Family Medicine

## 2012-10-05 VITALS — BP 137/90 | HR 76 | Temp 98.5°F | Resp 18 | Ht 62.0 in | Wt 160.2 lb

## 2012-10-05 DIAGNOSIS — I82409 Acute embolism and thrombosis of unspecified deep veins of unspecified lower extremity: Secondary | ICD-10-CM

## 2012-10-05 DIAGNOSIS — F329 Major depressive disorder, single episode, unspecified: Secondary | ICD-10-CM

## 2012-10-05 DIAGNOSIS — I82402 Acute embolism and thrombosis of unspecified deep veins of left lower extremity: Secondary | ICD-10-CM

## 2012-10-05 DIAGNOSIS — F32A Depression, unspecified: Secondary | ICD-10-CM

## 2012-10-05 DIAGNOSIS — J302 Other seasonal allergic rhinitis: Secondary | ICD-10-CM

## 2012-10-05 DIAGNOSIS — J309 Allergic rhinitis, unspecified: Secondary | ICD-10-CM | POA: Diagnosis not present

## 2012-10-05 DIAGNOSIS — F3289 Other specified depressive episodes: Secondary | ICD-10-CM

## 2012-10-05 MED ORDER — CITALOPRAM HYDROBROMIDE 40 MG PO TABS: 40.0000 mg | ORAL_TABLET | Freq: Every day | ORAL | Status: DC

## 2012-10-05 MED ORDER — PREDNISONE 20 MG PO TABS: ORAL_TABLET | ORAL | Status: DC

## 2012-10-05 NOTE — Patient Instructions (Signed)
Allergic Rhinitis  Allergic rhinitis is when the mucous membranes in the nose respond to allergens. Allergens are particles in the air that cause your body to have an allergic reaction. This causes you to release allergic antibodies. Through a chain of events, these eventually cause you to release histamine into the blood stream (hence the use of antihistamines). Although meant to be protective to the body, it is this release that causes your discomfort, such as frequent sneezing, congestion and an itchy runny nose.    CAUSES    The pollen allergens may come from grasses, trees, and weeds. This is seasonal allergic rhinitis, or "hay fever." Other allergens cause year-round allergic rhinitis (perennial allergic rhinitis) such as house dust mite allergen, pet dander and mold spores.    SYMPTOMS     Nasal stuffiness (congestion).   Runny, itchy nose with sneezing and tearing of the eyes.   There is often an itching of the mouth, eyes and ears.  It cannot be cured, but it can be controlled with medications.  DIAGNOSIS    If you are unable to determine the offending allergen, skin or blood testing may find it.  TREATMENT     Avoid the allergen.   Medications and allergy shots (immunotherapy) can help.   Hay fever may often be treated with antihistamines in pill or nasal spray forms. Antihistamines block the effects of histamine. There are over-the-counter medicines that may help with nasal congestion and swelling around the eyes. Check with your caregiver before taking or giving this medicine.  If the treatment above does not work, there are many new medications your caregiver can prescribe. Stronger medications may be used if initial measures are ineffective. Desensitizing injections can be used if medications and avoidance fails. Desensitization is when a patient is given ongoing shots until the body becomes less sensitive to the allergen. Make sure you follow up with your caregiver if problems continue.   SEEK MEDICAL CARE IF:     You develop fever (more than 100.5 F (38.1 C).   You develop a cough that does not stop easily (persistent).   You have shortness of breath.   You start wheezing.   Symptoms interfere with normal daily activities.  Document Released: 02/09/2001 Document Revised: 08/09/2011 Document Reviewed: 08/21/2008  [employer] Patient Information 2013 [employer].

## 2012-10-05 NOTE — Progress Notes (Signed)
62 yo woman, ex patient of Janace Hoard (retired), who is experiencing worsening symptoms of depressin with mood change, insomnia, and lethargy.  Currently on Lexapro 20 from [facility].  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  She is also having two weeks of sore throat and swollen sore submandibular glands.  Still smoking, but can go over 12 hours without smoking (eg. Has not smoked yet today, never smokes after 7 pm)  Objective: NAD, talkative and cheerful Skin:  Scattered psoriatic lesions on arms, scalp HEENT:  Mild erythema throat (post-tonsillectomy), normal TM's Neck:  Supple, no adenop, no thyromegaly Chest:  Few wheezes  (encouraged to quit smoking)  Assessment:  Worsening depression, allergies  Depression - Plan: citalopram (CELEXA) 40 MG tablet  Seasonal allergies - Plan: predniSONE (DELTASONE) 20 MG tablet  DVT (deep venous thrombosis), left - Plan: Lower Extremity Venous Duplex Left  Signed,  [name], MD

## 2012-10-06 ENCOUNTER — Ambulatory Visit (HOSPITAL_COMMUNITY)
Admission: RE | Admit: 2012-10-06 | Discharge: 2012-10-06 | Disposition: A | Discharge status: Home / Self Care | Payer: Medicare Other | Source: Ambulatory Visit | Attending: Family Medicine | Admitting: Family Medicine

## 2012-10-06 DIAGNOSIS — M79609 Pain in unspecified limb: Secondary | ICD-10-CM

## 2012-10-06 DIAGNOSIS — I82402 Acute embolism and thrombosis of unspecified deep veins of left lower extremity: Secondary | ICD-10-CM

## 2012-10-06 NOTE — Progress Notes (Signed)
*  Preliminary Results* Left lower extremity venous duplex completed. Left lower extremity is negative for deep vein thrombosis. There is a large dilatation of the left proximal popliteal vein with no evidence of thrombosis. No evidence of left Baker's cyst.  10/06/2012 4:15 PM Gertie Fey, RDMS, RDCS

## 2012-10-08 ENCOUNTER — Telehealth: Payer: Self-pay | Admitting: Family Medicine

## 2012-11-05 ENCOUNTER — Ambulatory Visit (INDEPENDENT_AMBULATORY_CARE_PROVIDER_SITE_OTHER): Payer: Medicare Other | Admitting: Family Medicine

## 2012-11-05 VITALS — BP 144/80 | HR 86 | Temp 98.0°F | Resp 16 | Ht <= 58 in | Wt 154.0 lb

## 2012-11-05 DIAGNOSIS — J309 Allergic rhinitis, unspecified: Secondary | ICD-10-CM | POA: Diagnosis not present

## 2012-11-05 DIAGNOSIS — F419 Anxiety disorder, unspecified: Secondary | ICD-10-CM

## 2012-11-05 DIAGNOSIS — L408 Other psoriasis: Secondary | ICD-10-CM | POA: Diagnosis not present

## 2012-11-05 DIAGNOSIS — F411 Generalized anxiety disorder: Secondary | ICD-10-CM

## 2012-11-05 DIAGNOSIS — J302 Other seasonal allergic rhinitis: Secondary | ICD-10-CM

## 2012-11-05 DIAGNOSIS — L409 Psoriasis, unspecified: Secondary | ICD-10-CM

## 2012-11-05 MED ORDER — METHYLPREDNISOLONE ACETATE 80 MG/ML IJ SUSP
80.0000 mg | Freq: Once | INTRAMUSCULAR | Status: AC
  Administered 2012-11-05: 80 mg via INTRAMUSCULAR

## 2012-11-05 MED ORDER — PREDNISONE 20 MG PO TABS: ORAL_TABLET | ORAL | Status: DC

## 2012-11-05 NOTE — Progress Notes (Signed)
62 yo woman with 12 years of psoriasis.  Periodically she gets a flare up, which  Began about 1 week ago.   The celexa is working well.  She terminated with Dr. Janace Hoard last week.  Objective:  NAD Bilateral arm exudative rash turgor is good eczema extending to palms. She also has a large psoriatic plaque on her left elbow. She has scattered papular erythema on both legs.  Assessment: Recurrent eczema and psoriasis  Plan: Depo-Medrol 80 IM and prednisone taper  Signed, [name]

## 2012-12-04 NOTE — Telephone Encounter (Signed)
error 

## 2012-12-09 ENCOUNTER — Other Ambulatory Visit: Payer: Self-pay | Admitting: Family Medicine

## 2012-12-25 DIAGNOSIS — E569 Vitamin deficiency, unspecified: Secondary | ICD-10-CM | POA: Diagnosis not present

## 2012-12-25 DIAGNOSIS — R7989 Other specified abnormal findings of blood chemistry: Secondary | ICD-10-CM | POA: Diagnosis not present

## 2012-12-25 DIAGNOSIS — R5382 Chronic fatigue, unspecified: Secondary | ICD-10-CM | POA: Diagnosis not present

## 2012-12-25 DIAGNOSIS — R5381 Other malaise: Secondary | ICD-10-CM | POA: Diagnosis not present

## 2012-12-25 DIAGNOSIS — M545 Low back pain: Secondary | ICD-10-CM | POA: Diagnosis not present

## 2012-12-25 DIAGNOSIS — E782 Mixed hyperlipidemia: Secondary | ICD-10-CM | POA: Diagnosis not present

## 2012-12-25 DIAGNOSIS — E559 Vitamin D deficiency, unspecified: Secondary | ICD-10-CM | POA: Diagnosis not present

## 2012-12-25 DIAGNOSIS — E039 Hypothyroidism, unspecified: Secondary | ICD-10-CM | POA: Diagnosis not present

## 2012-12-25 DIAGNOSIS — E538 Deficiency of other specified B group vitamins: Secondary | ICD-10-CM | POA: Diagnosis not present

## 2012-12-25 DIAGNOSIS — M2550 Pain in unspecified joint: Secondary | ICD-10-CM | POA: Diagnosis not present

## 2012-12-25 DIAGNOSIS — R5383 Other fatigue: Secondary | ICD-10-CM | POA: Diagnosis not present

## 2013-01-11 ENCOUNTER — Ambulatory Visit: Payer: Medicare Other | Admitting: Family Medicine

## 2013-01-24 ENCOUNTER — Ambulatory Visit (INDEPENDENT_AMBULATORY_CARE_PROVIDER_SITE_OTHER): Payer: Medicare Other | Admitting: Family Medicine

## 2013-01-24 ENCOUNTER — Emergency Department (HOSPITAL_COMMUNITY)
Admission: EM | Admit: 2013-01-24 | Discharge: 2013-01-24 | Disposition: A | Discharge status: Home / Self Care | Payer: Medicare Other | Attending: Emergency Medicine | Admitting: Emergency Medicine

## 2013-01-24 ENCOUNTER — Encounter (HOSPITAL_COMMUNITY): Payer: Self-pay | Admitting: Emergency Medicine

## 2013-01-24 VITALS — BP 160/70 | HR 124 | Resp 20

## 2013-01-24 DIAGNOSIS — T7800XA Anaphylactic reaction due to unspecified food, initial encounter: Secondary | ICD-10-CM | POA: Insufficient documentation

## 2013-01-24 DIAGNOSIS — Z7982 Long term (current) use of aspirin: Secondary | ICD-10-CM | POA: Insufficient documentation

## 2013-01-24 DIAGNOSIS — T783XXA Angioneurotic edema, initial encounter: Secondary | ICD-10-CM | POA: Diagnosis not present

## 2013-01-24 DIAGNOSIS — F17200 Nicotine dependence, unspecified, uncomplicated: Secondary | ICD-10-CM | POA: Diagnosis not present

## 2013-01-24 DIAGNOSIS — F329 Major depressive disorder, single episode, unspecified: Secondary | ICD-10-CM | POA: Diagnosis not present

## 2013-01-24 DIAGNOSIS — Z8719 Personal history of other diseases of the digestive system: Secondary | ICD-10-CM | POA: Insufficient documentation

## 2013-01-24 DIAGNOSIS — L259 Unspecified contact dermatitis, unspecified cause: Secondary | ICD-10-CM | POA: Diagnosis not present

## 2013-01-24 DIAGNOSIS — F411 Generalized anxiety disorder: Secondary | ICD-10-CM | POA: Diagnosis not present

## 2013-01-24 DIAGNOSIS — J45909 Unspecified asthma, uncomplicated: Secondary | ICD-10-CM | POA: Diagnosis not present

## 2013-01-24 DIAGNOSIS — T628X1A Toxic effect of other specified noxious substances eaten as food, accidental (unintentional), initial encounter: Secondary | ICD-10-CM | POA: Insufficient documentation

## 2013-01-24 DIAGNOSIS — K3189 Other diseases of stomach and duodenum: Secondary | ICD-10-CM | POA: Diagnosis not present

## 2013-01-24 DIAGNOSIS — Z862 Personal history of diseases of the blood and blood-forming organs and certain disorders involving the immune mechanism: Secondary | ICD-10-CM | POA: Diagnosis not present

## 2013-01-24 DIAGNOSIS — M129 Arthropathy, unspecified: Secondary | ICD-10-CM | POA: Diagnosis not present

## 2013-01-24 DIAGNOSIS — Z872 Personal history of diseases of the skin and subcutaneous tissue: Secondary | ICD-10-CM | POA: Insufficient documentation

## 2013-01-24 DIAGNOSIS — L509 Urticaria, unspecified: Secondary | ICD-10-CM | POA: Diagnosis not present

## 2013-01-24 DIAGNOSIS — Z79899 Other long term (current) drug therapy: Secondary | ICD-10-CM | POA: Diagnosis not present

## 2013-01-24 DIAGNOSIS — F3289 Other specified depressive episodes: Secondary | ICD-10-CM | POA: Insufficient documentation

## 2013-01-24 DIAGNOSIS — T7840XA Allergy, unspecified, initial encounter: Secondary | ICD-10-CM

## 2013-01-24 DIAGNOSIS — T782XXA Anaphylactic shock, unspecified, initial encounter: Secondary | ICD-10-CM

## 2013-01-24 DIAGNOSIS — Y9389 Activity, other specified: Secondary | ICD-10-CM | POA: Insufficient documentation

## 2013-01-24 DIAGNOSIS — Y9289 Other specified places as the place of occurrence of the external cause: Secondary | ICD-10-CM | POA: Insufficient documentation

## 2013-01-24 DIAGNOSIS — Z8639 Personal history of other endocrine, nutritional and metabolic disease: Secondary | ICD-10-CM | POA: Insufficient documentation

## 2013-01-24 MED ORDER — FAMOTIDINE 20 MG PO TABS: 20.0000 mg | ORAL_TABLET | Freq: Two times a day (BID) | ORAL | Status: DC

## 2013-01-24 MED ORDER — DIPHENHYDRAMINE HCL 50 MG/ML IJ SOLN
50.0000 mg | Freq: Once | INTRAMUSCULAR | Status: AC
  Administered 2013-01-24: 50 mg via INTRAMUSCULAR

## 2013-01-24 MED ORDER — FAMOTIDINE IN NACL 20-0.9 MG/50ML-% IV SOLN
20.0000 mg | INTRAVENOUS | Status: AC
  Administered 2013-01-24: 20 mg via INTRAVENOUS
  Filled 2013-01-24: qty 50

## 2013-01-24 MED ORDER — DIPHENHYDRAMINE HCL 25 MG PO CAPS: 50.0000 mg | ORAL_CAPSULE | Freq: Once | ORAL | Status: DC

## 2013-01-24 MED ORDER — EPINEPHRINE 0.3 MG/0.3ML IJ SOAJ: 0.3000 mg | Freq: Once | INTRAMUSCULAR | Status: DC | PRN

## 2013-01-24 MED ORDER — IPRATROPIUM BROMIDE 0.02 % IN SOLN
0.5000 mg | Freq: Once | RESPIRATORY_TRACT | Status: AC
  Administered 2013-01-24: 0.5 mg via RESPIRATORY_TRACT
  Filled 2013-01-24: qty 2.5

## 2013-01-24 MED ORDER — ALBUTEROL SULFATE (5 MG/ML) 0.5% IN NEBU
5.0000 mg | INHALATION_SOLUTION | Freq: Once | RESPIRATORY_TRACT | Status: AC
  Administered 2013-01-24: 5 mg via RESPIRATORY_TRACT
  Filled 2013-01-24: qty 1

## 2013-01-24 MED ORDER — EPINEPHRINE 0.3 MG/0.3ML IJ SOAJ: 0.3000 mg | Freq: Once | INTRAMUSCULAR | Status: DC

## 2013-01-24 NOTE — ED Notes (Addendum)
Pt just had allergy testing and decided she had food allergies. Today she had garden burger and had an allergic rx. Came from [facility] where she received IM epi, 125 solumedrol, 50mg  benadryl IV. Unsure if pt received Pepcid/Xantac d/t UC printing wrong paperwork. 20g RAC. Pt had presented w/ angioedema w/ sats in 80s upon initial arrival and hives. Pt alert and oriented and stable at this time. Pt does have underlying resp issues.

## 2013-01-24 NOTE — ED Provider Notes (Signed)
CSN: [id number]     Arrival date & time 01/24/13  1708 History   First MD Initiated Contact with Patient 01/24/13 1728     Chief Complaint  Patient presents with  . Allergic Reaction   (Consider location/radiation/quality/duration/timing/severity/associated sxs/prior Treatment) HPI  Tasha [name] is a 62 y.o. female brought in by EMS for anaphylactic reaction. Patient recently became feeding she was eating a garden burger and noticed a swelling and itching sensation in her fingers and palms. This progressed to angioedema, dyspepsia. Patient presented to [facility] urgent care where she was given epinephrine, Solu-Medrol and Benadryl. Patient has never had an anaphylactic reaction in the past. She denies any shortness of breath, wheezing, tongue swelling, abdominal pain, nausea, rash or itching.  Past Medical History  Diagnosis Date  . Psoriasiform dermatitis   . Asthma   . Allergy   . Arthritis   . Depression   . GERD (gastroesophageal reflux disease)   . Hyperlipidemia   . Anxiety   . Clotting disorder    Past Surgical History  Procedure Laterality Date  . Nose surgery    . Tonsillectomy    . Tubal ligation     Family History  Problem Relation Age of Onset  . Cancer Mother     colon  . Heart disease Father   . Cancer Maternal Grandfather     lung  . Cancer Maternal Aunt     breast   History  Substance Use Topics  . Smoking status: Current Every Day Smoker -- 1.00 packs/day    Types: Cigarettes  . Smokeless tobacco: Not on file  . Alcohol Use: No   OB History   Grav Para Term Preterm Abortions TAB SAB Ect Mult Living                 Review of Systems 10 systems reviewed and found to be negative, except as noted in the HPI   Allergies  Acitretin; Other; Oxycodone-aspirin; and Statins  Home Medications   Current Outpatient Rx  Name  Route  Sig  Dispense  Refill  . albuterol (PROVENTIL HFA;VENTOLIN HFA) 108 (90 BASE) MCG/ACT inhaler   Inhalation   Inhale 2  puffs into the lungs every 6 (six) hours as needed. For shortness of breath/wheezing         . aspirin 325 MG tablet   Oral   Take 325 mg by mouth daily.         . cholecalciferol (VITAMIN D) 1000 UNITS tablet   Oral   Take 5,000 Units by mouth 2 (two) times daily.         . citalopram (CELEXA) 20 MG tablet   Oral   Take 20 mg by mouth daily.         . ferrous sulfate 325 (65 FE) MG tablet   Oral   Take 325 mg by mouth daily with breakfast.         . Guggulipid-Black Pepper 1-5 GM-MG TABS   Oral   Take 1 tablet by mouth 2 (two) times daily.         . hydrOXYzine (ATARAX/VISTARIL) 10 MG tablet   Oral   Take 10-20 mg by mouth every 6 (six) hours as needed. For itching         . MAGNESIUM CITRATE PO   Oral   Take 1 tablet by mouth daily.         . Red Yeast Rice Extract (RED YEAST RICE PO)   Oral  Take 1 tablet by mouth daily.         [name] rOPINIRole (REQUIP) 4 MG tablet      TAKE 1 TABLET (4 MG TOTAL) BY MOUTH AT BEDTIME.   90 tablet   1   . sucralfate (CARAFATE) 1 G tablet   Oral   Take 1 tablet (1 g total) by mouth 4 (four) times daily.   60 tablet   0   . tiotropium (SPIRIVA) 18 MCG inhalation capsule   Inhalation   Place 1 capsule (18 mcg total) into inhaler and inhale daily.   30 capsule   11   . vitamin B-12 (CYANOCOBALAMIN) 100 MCG tablet   Oral   Take 50 mcg by mouth daily.         [name] zinc gluconate 50 MG tablet   Oral   Take 50 mg by mouth daily.         [name] EPINEPHrine (EPI-PEN) 0.3 mg/0.3 mL SOAJ injection   Intramuscular   Inject 0.3 mLs (0.3 mg total) into the muscle once. As needed for allergic reaction   2 Device   prn    BP 116/59  Pulse 87  Temp(Src) 98.9 F (37.2 C) (Oral)  Resp 20  SpO2 92% Physical Exam  Nursing note and vitals reviewed. Constitutional: She is oriented to person, place, and time. She appears well-developed and well-nourished. No distress.  HENT:  Head: Normocephalic and atraumatic.   Mouth/Throat: Oropharynx is clear and moist.  Airway patent, no swelling in the lips, tongue or posterior pharynx.  Eyes: Conjunctivae and EOM are normal. Pupils are equal, round, and reactive to light.  Neck: Normal range of motion.  Cardiovascular: Normal rate, regular rhythm, normal heart sounds and intact distal pulses.   Pulmonary/Chest: Effort normal and breath sounds normal. No stridor. No respiratory distress. She has no wheezes. She has no rales. She exhibits no tenderness.  Abdominal: Soft. Bowel sounds are normal. She exhibits no distension and no mass. There is no tenderness. There is no rebound and no guarding.  Musculoskeletal: Normal range of motion.  Neurological: She is alert and oriented to person, place, and time.  Psychiatric: She has a normal mood and affect.    ED Course  Procedures (including critical care time) Labs Review Labs Reviewed - No data to display Imaging Review No results found.  MDM   1. Anaphylactic reaction, initial encounter    Filed Vitals:   01/24/13 1708 01/24/13 1853 01/24/13 1857 01/24/13 1930  BP:  116/59  106/56  Pulse:  87  79  Temp:  98.9 F (37.2 C)    TempSrc:  Oral    Resp:  20  18  SpO2: 92% 87% 92% 91%     Tasha [name] is a 62 y.o. female with first anaphylactic reaction, given epi, solumedrol and benadryl at 1700 at [place] [facility]. Pt stable on arrival to the ed with no respiratory symptoms, angioedema, hives or urticaria. Patient saturating at 85% on room air. She is an active smoker with COPD states she that she normally often is around 92%. Patient is very comfortable, there is no accessory muscle use or tachypnea. DuoNeb and Pepcid given.   Medications  famotidine (PEPCID) IVPB 20 mg (20 mg Intravenous New Bag/Given 01/24/13 2042)  albuterol (PROVENTIL) (5 MG/ML) 0.5% nebulizer solution 5 mg (5 mg Nebulization Given 01/24/13 1959)  ipratropium (ATROVENT) nebulizer solution 0.5 mg (0.5 mg Nebulization Given 01/24/13  1959)    Pt is hemodynamically stable,  appropriate for, and amenable to discharge at this time. Pt verbalized understanding and agrees with care plan. All questions answered. Outpatient follow-up and specific return precautions discussed.    New Prescriptions   EPINEPHRINE (EPIPEN 2-PAK) 0.3 MG/0.3 ML SOAJ INJECTION    Inject 0.3 mLs (0.3 mg total) into the muscle once as needed (for severe allergic reaction). CAll 911 immediately if you have to use this medicine   FAMOTIDINE (PEPCID) 20 MG TABLET    Take 1 tablet (20 mg total) by mouth 2 (two) times daily.    Note: Portions of this report may have been transcribed using voice recognition software. Every effort was made to ensure accuracy; however, inadvertent computerized transcription errors may be present      Wynetta Emery, PA-C 01/24/13 2112

## 2013-01-24 NOTE — Progress Notes (Signed)
[facility] [address] [place] [address] [phone]  Date:  01/24/2013   Name:  Tasha [name]   DOB:  [birth year]   MRN:  [id number]  PCP:  [name], MD    Chief Complaint: No chief complaint on file.   History of Present Illness:  Tasha [name] is a 62 y.o. very pleasant female patient who presents with the following:  Brought back urgently with a likely anaphylactic reaction.  Noted swelling of her face and thraot, and felt short of breath.  She was itching all over.  She had eaten a veggie burger shortly before her reaction.  She has never had this in the past.  She has also been taking some new dietary supplements recenlty,    Patient Active Problem List   Diagnosis Date Noted  . DVT (deep venous thrombosis) 03/10/2012  . ELEVATED BP READING WITHOUT DX HYPERTENSION 11/18/2009  . PSORIASIS 09/09/2009  . HYPERGLYCEMIA 02/10/2009  . ENDOMETRIAL POLYP 10/01/2008  . HYPERLIPIDEMIA 04/17/2008  . DEPRESSION 04/17/2008  . ASTHMA 04/17/2008  . SCOLIOSIS 04/17/2008  . COLONIC POLYPS, HX OF 04/17/2008    Past Medical History  Diagnosis Date  . Psoriasiform dermatitis   . Asthma   . Allergy   . Arthritis   . Depression   . GERD (gastroesophageal reflux disease)   . Hyperlipidemia   . Anxiety   . Clotting disorder     Past Surgical History  Procedure Laterality Date  . Nose surgery    . Tonsillectomy    . Tubal ligation      History  Substance Use Topics  . Smoking status: Current Every Day Smoker -- 1.00 packs/day    Types: Cigarettes  . Smokeless tobacco: Not on file  . Alcohol Use: No    Family History  Problem Relation Age of Onset  . Cancer Mother     colon  . Heart disease Father   . Cancer Maternal Grandfather     lung  . Cancer Maternal Aunt     breast    Allergies  Allergen Reactions  . Acitretin Swelling and Rash  . Oxycodone-Aspirin   . Statins     Medication list has been reviewed and  updated.  Current Outpatient Prescriptions on File Prior to Visit  Medication Sig Dispense Refill  . albuterol (PROVENTIL HFA;VENTOLIN HFA) 108 (90 BASE) MCG/ACT inhaler Inhale 2 puffs into the lungs every 6 (six) hours as needed. For shortness of breath/wheezing      . citalopram (CELEXA) 40 MG tablet Take 1 tablet (40 mg total) by mouth daily.  90 tablet  3  . hydrOXYzine (ATARAX/VISTARIL) 10 MG tablet Take 10-20 mg by mouth every 6 (six) hours as needed. For itching      . Mometasone Furo-Formoterol Fum 200-5 MCG/ACT AERO Inhale 2 puffs into the lungs 2 (two) times daily.      . predniSONE (DELTASONE) 20 MG tablet 2 daily with food  30 tablet  1  . rOPINIRole (REQUIP) 4 MG tablet TAKE 1 TABLET (4 MG TOTAL) BY MOUTH AT BEDTIME.  90 tablet  1  . sucralfate (CARAFATE) 1 G tablet Take 1 tablet (1 g total) by mouth 4 (four) times daily.  60 tablet  0  . tiotropium (SPIRIVA) 18 MCG inhalation capsule Place 1 capsule (18 mcg total) into inhaler and inhale daily.  30 capsule  11  . triamcinolone cream (KENALOG) 0.1 %       . zoster  vaccine live, PF, (ZOSTAVAX) 16109 UNT/0.65ML injection Inject 19,400 Units into the skin once.  1 each  0   No current facility-administered medications on file prior to visit.    Review of Systems:  As per HPI- otherwise negative.   Physical Examination: There were no vitals filed for this visit. There were no vitals filed for this visit. There is no weight on file to calculate BMI. Ideal Body Weight:    GEN: WDWN, face is bright red, slight swelling of her eyelids bilaterally, slight lip swelling, pt endorses that her tongue is swollen.  O2 sat 88% on room air HEENT: Atraumatic, Normocephalic. Neck supple. No masses, No LAD. Ears and Nose: No external deformity. CV: RRR, No M/G/R. No JVD. No thrill. No extra heart sounds. PULM: CTA B, no wheezes, crackles, rhonchi. No retractions. No resp. distress. No accessory muscle use. ABD: S, NT, ND EXTR: No  c/c/e NEURO Normal gait.  PSYCH: Normally interactive. Appropriately anxious  Pt given 0.3 mg of epinephrine in the left glute, IV line started. o2 via [place].  Given 50 mg of IM benadryl, and 125 mg of IV solumedrol. Noted to have slight swelling of her posterior pharynx and EMS called emergent traffic.  However, her throat swelling and other sx then began to remit.  EMS to transport to ER.    Placed 20 gauge IV in right AC per Frances Furbish, PA-C.   Assessment and Plan: Allergic reaction, initial encounter - Plan: diphenhydrAMINE (BENADRYL) injection 50 mg  Anaphylaxis, initial encounter - Plan: EPINEPHrine (EPI-PEN) 0.3 mg/0.3 mL SOAJ injection  Anaphylaxis- treated as above.  Pt improved, transport to ED via EMS.   Sent rx for epi- pen to her drug store.  Advised her to keep this with her at all times   Signed Abbe Amsterdam, MD

## 2013-01-24 NOTE — ED Notes (Signed)
Pt placed on O2 Sats.

## 2013-01-24 NOTE — ED Notes (Signed)
Bed: WA09 Expected date:  Expected time:  Means of arrival:  Comments: Allergic reaction

## 2013-01-25 ENCOUNTER — Encounter: Payer: Medicare Other | Admitting: Family Medicine

## 2013-01-26 ENCOUNTER — Ambulatory Visit (INDEPENDENT_AMBULATORY_CARE_PROVIDER_SITE_OTHER): Payer: Medicare Other | Admitting: Emergency Medicine

## 2013-01-26 VITALS — BP 122/74 | HR 85 | Temp 98.0°F | Resp 17 | Ht 61.5 in | Wt 152.0 lb

## 2013-01-26 DIAGNOSIS — J302 Other seasonal allergic rhinitis: Secondary | ICD-10-CM

## 2013-01-26 DIAGNOSIS — Z5189 Encounter for other specified aftercare: Secondary | ICD-10-CM

## 2013-01-26 DIAGNOSIS — J309 Allergic rhinitis, unspecified: Secondary | ICD-10-CM

## 2013-01-26 DIAGNOSIS — T782XXD Anaphylactic shock, unspecified, subsequent encounter: Secondary | ICD-10-CM

## 2013-01-26 NOTE — Progress Notes (Signed)
  Subjective:    Patient ID: Tasha [name], female    DOB: 22-Jun-1950, 62 y.o.   MRN: [id number]  HPI Pt here for a hospital f/u for an anaphalactic reaction after eating at a deli, she had a vegetable burger, this happened this Tuesday She had blood work to know allergies and cant eat any dairy products, chicken,eggs, walnuts, almonds or dressings.   Review of Systems     Objective:   Physical Exam neck is supple chest is clear examination of mouth reveals no edema there is no swelling of the hands or feet        Assessment & Plan:  I did call Dr. Roxy Cedar office today. They are going going to work the patient in their office in about 10 days. She is to avoid veggie burgers which seemed to be the precipitating food which triggered her anaphylaxis.

## 2013-01-30 NOTE — ED Provider Notes (Signed)
Medical screening examination/treatment/procedure(s) were performed by non-physician practitioner and as supervising physician I was immediately available for consultation/collaboration.   Derwood Kaplan, MD 01/30/13 1654

## 2013-01-31 ENCOUNTER — Telehealth: Payer: Self-pay | Admitting: Internal Medicine

## 2013-01-31 NOTE — Telephone Encounter (Signed)
lmomtcb x1 for pt 

## 2013-01-31 NOTE — Telephone Encounter (Signed)
Sensitivity to monosodium glutamate is a chemical response, not an allergy. I have no test specifically for this.

## 2013-01-31 NOTE — Telephone Encounter (Signed)
I spoke with pt. She is wanting to know if Dr. Maple Hudson does "MSG" testing. She stated this is on different types of foods, etc. Pt has pending appt 02/06/13 for new allergy consult. Please advise Dr. Maple Hudson thanks

## 2013-02-01 ENCOUNTER — Encounter: Payer: Self-pay | Admitting: Family Medicine

## 2013-02-01 NOTE — Telephone Encounter (Signed)
Spoke with patient; aware of no test specifically for MSG and will keep consult with CY on 02-06-13 to discuss more food allergies as well.

## 2013-02-03 ENCOUNTER — Telehealth: Payer: Self-pay | Admitting: Family Medicine

## 2013-02-03 NOTE — Telephone Encounter (Signed)
Called to check on her, she is doing well

## 2013-02-06 ENCOUNTER — Institutional Professional Consult (permissible substitution): Payer: Medicare Other | Admitting: Internal Medicine

## 2013-03-14 ENCOUNTER — Institutional Professional Consult (permissible substitution): Payer: Medicare Other | Admitting: Internal Medicine

## 2013-04-16 ENCOUNTER — Other Ambulatory Visit: Payer: Self-pay | Admitting: Family Medicine

## 2013-05-28 DIAGNOSIS — Z23 Encounter for immunization: Secondary | ICD-10-CM | POA: Diagnosis not present

## 2013-05-28 DIAGNOSIS — Z9189 Other specified personal risk factors, not elsewhere classified: Secondary | ICD-10-CM | POA: Diagnosis not present

## 2013-05-28 DIAGNOSIS — Z01419 Encounter for gynecological examination (general) (routine) without abnormal findings: Secondary | ICD-10-CM | POA: Diagnosis not present

## 2013-06-02 ENCOUNTER — Other Ambulatory Visit: Payer: Self-pay | Admitting: Family Medicine

## 2013-07-19 ENCOUNTER — Other Ambulatory Visit: Payer: Self-pay | Admitting: Physician Assistant

## 2013-07-19 ENCOUNTER — Other Ambulatory Visit: Payer: Self-pay | Admitting: Family Medicine

## 2013-08-16 ENCOUNTER — Other Ambulatory Visit: Payer: Self-pay

## 2013-08-16 DIAGNOSIS — Z1231 Encounter for screening mammogram for malignant neoplasm of breast: Secondary | ICD-10-CM

## 2013-08-29 ENCOUNTER — Ambulatory Visit
Admission: RE | Admit: 2013-08-29 | Discharge: 2013-08-29 | Disposition: A | Discharge status: Home / Self Care | Payer: Medicare Other | Source: Ambulatory Visit

## 2013-08-29 DIAGNOSIS — Z1231 Encounter for screening mammogram for malignant neoplasm of breast: Secondary | ICD-10-CM

## 2013-09-04 ENCOUNTER — Other Ambulatory Visit: Payer: Self-pay | Admitting: Family Medicine

## 2013-09-22 ENCOUNTER — Other Ambulatory Visit: Payer: Self-pay | Admitting: Family Medicine

## 2013-09-23 NOTE — Telephone Encounter (Signed)
Meds ordered this encounter  Medications  . hydrOXYzine (ATARAX/VISTARIL) 10 MG tablet    Sig: TAKE 1 TO 2 TABLETS UP TOEVERY 6 HOURS AS NEEDED.    Dispense:  100 tablet    Refill:  3

## 2013-10-06 ENCOUNTER — Other Ambulatory Visit: Payer: Self-pay | Admitting: Family Medicine

## 2013-10-25 ENCOUNTER — Encounter: Payer: Self-pay | Admitting: Family Medicine

## 2013-10-25 ENCOUNTER — Other Ambulatory Visit: Payer: Self-pay | Admitting: Physician Assistant

## 2013-10-25 ENCOUNTER — Ambulatory Visit (INDEPENDENT_AMBULATORY_CARE_PROVIDER_SITE_OTHER): Payer: Medicare Other | Admitting: Family Medicine

## 2013-10-25 VITALS — BP 137/87 | HR 71 | Temp 98.8°F | Resp 18 | Ht 61.0 in | Wt 160.0 lb

## 2013-10-25 DIAGNOSIS — L408 Other psoriasis: Secondary | ICD-10-CM | POA: Diagnosis not present

## 2013-10-25 DIAGNOSIS — G4761 Periodic limb movement disorder: Secondary | ICD-10-CM

## 2013-10-25 DIAGNOSIS — J45901 Unspecified asthma with (acute) exacerbation: Secondary | ICD-10-CM | POA: Diagnosis not present

## 2013-10-25 DIAGNOSIS — L409 Psoriasis, unspecified: Secondary | ICD-10-CM

## 2013-10-25 MED ORDER — PREDNISONE 20 MG PO TABS: ORAL_TABLET | ORAL | Status: DC

## 2013-10-25 MED ORDER — HYDROXYZINE HCL 10 MG PO TABS: 10.0000 mg | ORAL_TABLET | Freq: Three times a day (TID) | ORAL | Status: DC | PRN

## 2013-10-25 MED ORDER — ROPINIROLE HCL 5 MG PO TABS: 5.0000 mg | ORAL_TABLET | Freq: Every day | ORAL | Status: DC

## 2013-10-25 MED ORDER — TIOTROPIUM BROMIDE MONOHYDRATE 18 MCG IN CAPS: ORAL_CAPSULE | RESPIRATORY_TRACT | Status: DC

## 2013-10-25 MED ORDER — METHYLPREDNISOLONE ACETATE 80 MG/ML IJ SUSP
80.0000 mg | Freq: Once | INTRAMUSCULAR | Status: AC
  Administered 2013-10-25: 80 mg via INTRAMUSCULAR

## 2013-10-25 MED ORDER — CITALOPRAM HYDROBROMIDE 20 MG PO TABS
40.0000 mg | ORAL_TABLET | Freq: Every day | ORAL | Status: DC
Start: 2013-10-25 — End: 2013-10-25

## 2013-10-25 NOTE — Addendum Note (Signed)
Addended by: [name] on: 10/25/2013 01:08 PM   Modules accepted: Orders, Medications

## 2013-10-25 NOTE — Progress Notes (Signed)
Patient ID: Tasha [name] MRN: [id number], DOB: 06-May-1951, 63 y.o. Date of Encounter: 10/25/2013, 10:21 AM  Primary Physician: [name], MD  Chief Complaint:  Chief Complaint  Patient presents with  . Medication Refill    HPI: 63 y.o. year old female presents with a 7 day history of nasal congestion, post nasal drip, sore throat, and cough. Mild sinus pressure. Afebrile. No chills. Nasal congestion thick and green/yellow. Cough is productive of green/yellow sputum and not associated with time of day. Ears feel full, leading to sensation of muffled hearing. Has tried OTC cold preps without success. No GI complaints.   Patient needs a refill on her Spiriva.  Patient has seen Dr. 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  No sick contacts, recent antibiotics, or recent travels.   No leg trauma, sedentary periods, h/o cancer, or tobacco use.  Past Medical History  Diagnosis Date  . Psoriasiform dermatitis   . Asthma   . Allergy   . Arthritis   . Depression   . GERD (gastroesophageal reflux disease)   . Hyperlipidemia   . Anxiety   . Clotting disorder      Home Meds: Prior to Admission medications   Medication Sig Start Date End Date Taking? Authorizing Provider  albuterol (VENTOLIN HFA) 108 (90 BASE) MCG/ACT inhaler Inhale 2 puffs into the lungs every 6 (six) hours as needed. NO MORE REFILLS W/OUT OFFICE VISIT 07/19/13  Yes Godfrey Pick, PA-C  aspirin 325 MG tablet Take 325 mg by mouth daily.   Yes Historical Provider, MD  citalopram (CELEXA) 40 MG tablet Take 1 tablet (40 mg total) by mouth daily. PATIENT NEEDS OFFICE VISIT FOR ADDITIONAL REFILLS   Yes [name], MD  EPINEPHrine (EPI-PEN) 0.3 mg/0.3 mL SOAJ injection Inject 0.3 mLs (0.3 mg total) into the muscle once. As needed for allergic reaction 01/24/13  Yes Gwenlyn Found Copland, MD  EPINEPHrine (EPIPEN 2-PAK) 0.3 mg/0.3 mL SOAJ injection  Inject 0.3 mLs (0.3 mg total) into the muscle once as needed (for severe allergic reaction). CAll 911 immediately if you have to use this medicine 01/24/13  Yes Nicole Pisciotta, PA-C  hydrOXYzine (ATARAX/VISTARIL) 10 MG tablet TAKE 1 TO 2 TABLETS UP TOEVERY 6 HOURS AS NEEDED.   Yes Chelle S Jeffery, PA-C  tiotropium (SPIRIVA HANDIHALER) 18 MCG inhalation capsule Place 1 capsule into inhaler and inhale daily. 10/25/13  Yes [name], MD  zinc gluconate 50 MG tablet Take 50 mg by mouth daily.   Yes Historical Provider, MD  cholecalciferol (VITAMIN D) 1000 UNITS tablet Take 5,000 Units by mouth 2 (two) times daily.    Historical Provider, MD  citalopram (CELEXA) 20 MG tablet Take 40 mg by mouth daily.     Historical Provider, MD  famotidine (PEPCID) 20 MG tablet Take 1 tablet (20 mg total) by mouth 2 (two) times daily. 01/24/13   Nicole Pisciotta, PA-C  ferrous sulfate 325 (65 FE) MG tablet Take 325 mg by mouth daily with breakfast.    Historical Provider, MD  Guggulipid-Black Pepper 1-5 GM-MG TABS Take 1 tablet by mouth 2 (two) times daily.    Historical Provider, MD  MAGNESIUM CITRATE PO Take 1 tablet by mouth daily.    Historical Provider, MD  predniSONE (DELTASONE) 20 MG tablet 2 daily with food 10/25/13   [name], MD  Red Yeast Rice Extract (RED YEAST RICE PO) Take 1 tablet by mouth daily.  Historical Provider, MD  sucralfate (CARAFATE) 1 G tablet Take 1 tablet (1 g total) by mouth 4 (four) times daily. 04/19/12   Sondra Bargesyan M Dunn, PA-C  vitamin B-12 (CYANOCOBALAMIN) 100 MCG tablet Take 50 mcg by mouth daily.    Historical Provider, MD    Allergies:  Allergies  Allergen Reactions  . Acitretin Swelling and Rash  . Other     Unidentified multiple food allergies   . Oxycodone-Aspirin   . Statins     History   Social History  . Marital Status: Single    Spouse Name: N/A    Number of Children: N/A  . Years of Education: N/A   Occupational History  . Not on file.   Social  History Main Topics  . Smoking status: Current Every Day Smoker -- 1.00 packs/day    Types: Cigarettes  . Smokeless tobacco: Not on file  . Alcohol Use: No  . Drug Use: No  . Sexual Activity: Not on file   Other Topics Concern  . Not on file   Social History Narrative  . No narrative on file     Review of Systems: Constitutional: negative for chills, fever, night sweats or weight changes Cardiovascular: negative for chest pain or palpitations Respiratory: negative for hemoptysis, wheezing, or shortness of breath Abdominal: negative for abdominal pain, nausea, vomiting or diarrhea Dermatological: negative for rash Neurologic: negative for headache   Physical Exam: Blood pressure 137/87, pulse 71, temperature 98.8 F (37.1 C), temperature source Oral, resp. rate 18, height 5\' 1"  (1.549 m), weight 160 lb (72.576 kg), SpO2 92.00%., Body mass index is 30.25 kg/(m^2). General: Well developed, well nourished, in no acute distress. Head: Normocephalic, atraumatic, eyes without discharge, sclera non-icteric, nares are congested. Bilateral auditory canals clear, TM's are without perforation, pearly grey with reflective cone of light bilaterally. No sinus TTP. Oral cavity moist, dentition normal. Posterior pharynx with post nasal drip and mild erythema. No peritonsillar abscess or tonsillar exudate. Neck: Supple. No thyromegaly. Full ROM. No lymphadenopathy. Lungs: Coarse breath sounds bilaterally without wheezes, rales, or rhonchi. Breathing is unlabored.  Heart: RRR with S1 S2. No murmurs, rubs, or gallops appreciated. Msk:  Strength and tone normal for age. Extremities: No clubbing or cyanosis. No edema. Neuro: Alert and oriented X 3. Moves all extremities spontaneously. CNII-XII grossly in tact. Psych:  Responds to questions appropriately with a normal affect.   Skin:  Marked psoriatic breakdown on left forearm, left foot, right instep.   ASSESSMENT AND PLAN:  63 y.o. year old  female with bronchitis. Psoriasis - Plan: methylPREDNISolone acetate (DEPO-MEDROL) injection 80 mg, predniSONE (DELTASONE) 20 MG tablet  Asthma with acute exacerbation - Plan: tiotropium (SPIRIVA HANDIHALER) 18 MCG inhalation capsule   -RTC 3-5 days if no improvement  Signed, Elvina SidleKurt Noelia Lenart, MD 10/25/2013 10:21 AM

## 2013-10-30 DIAGNOSIS — E539 Vitamin B deficiency, unspecified: Secondary | ICD-10-CM | POA: Diagnosis not present

## 2013-10-30 DIAGNOSIS — D529 Folate deficiency anemia, unspecified: Secondary | ICD-10-CM | POA: Diagnosis not present

## 2013-10-30 DIAGNOSIS — R5381 Other malaise: Secondary | ICD-10-CM | POA: Diagnosis not present

## 2013-10-30 DIAGNOSIS — R7989 Other specified abnormal findings of blood chemistry: Secondary | ICD-10-CM | POA: Diagnosis not present

## 2013-10-30 DIAGNOSIS — E559 Vitamin D deficiency, unspecified: Secondary | ICD-10-CM | POA: Diagnosis not present

## 2013-10-30 DIAGNOSIS — E7800 Pure hypercholesterolemia, unspecified: Secondary | ICD-10-CM | POA: Diagnosis not present

## 2013-11-16 ENCOUNTER — Ambulatory Visit (INDEPENDENT_AMBULATORY_CARE_PROVIDER_SITE_OTHER): Payer: Medicare Other | Admitting: Family Medicine

## 2013-11-16 VITALS — BP 130/80 | HR 74 | Temp 98.3°F | Resp 20 | Ht 61.0 in | Wt 160.0 lb

## 2013-11-16 DIAGNOSIS — G2581 Restless legs syndrome: Secondary | ICD-10-CM | POA: Diagnosis not present

## 2013-11-16 MED ORDER — ROPINIROLE HCL 1 MG PO TABS: ORAL_TABLET | ORAL | Status: DC

## 2013-11-16 NOTE — Progress Notes (Signed)
63 yo woman with problem after taking ropinorole:  Sweating, nausea, inability to move.  These symptoms have come on after bumping the dose to 5 mg.  Patient has B12 and vitamin D deficiency.  Objective:  No acute distress or tic movements.  We discussed these side effects  Restless legs - Plan: rOPINIRole (REQUIP) 1 MG tablet Reducing citalopram Signed, Elvina SidleKurt Kahlin Mark, MD

## 2013-11-24 ENCOUNTER — Emergency Department (HOSPITAL_COMMUNITY): Payer: Medicare Other

## 2013-11-24 ENCOUNTER — Ambulatory Visit (INDEPENDENT_AMBULATORY_CARE_PROVIDER_SITE_OTHER): Payer: Medicare Other | Admitting: Family Medicine

## 2013-11-24 ENCOUNTER — Encounter (HOSPITAL_COMMUNITY): Payer: Self-pay | Admitting: Emergency Medicine

## 2013-11-24 ENCOUNTER — Emergency Department (HOSPITAL_COMMUNITY)
Admission: EM | Admit: 2013-11-24 | Discharge: 2013-11-24 | Disposition: A | Discharge status: Home / Self Care | Payer: Medicare Other | Attending: Emergency Medicine | Admitting: Emergency Medicine

## 2013-11-24 ENCOUNTER — Encounter: Payer: Self-pay | Admitting: Family Medicine

## 2013-11-24 VITALS — BP 148/90 | HR 97 | Temp 97.8°F | Resp 18 | Ht 61.0 in | Wt 157.2 lb

## 2013-11-24 DIAGNOSIS — Z8659 Personal history of other mental and behavioral disorders: Secondary | ICD-10-CM | POA: Insufficient documentation

## 2013-11-24 DIAGNOSIS — J45909 Unspecified asthma, uncomplicated: Secondary | ICD-10-CM | POA: Diagnosis not present

## 2013-11-24 DIAGNOSIS — R9389 Abnormal findings on diagnostic imaging of other specified body structures: Secondary | ICD-10-CM | POA: Diagnosis not present

## 2013-11-24 DIAGNOSIS — Z862 Personal history of diseases of the blood and blood-forming organs and certain disorders involving the immune mechanism: Secondary | ICD-10-CM | POA: Diagnosis not present

## 2013-11-24 DIAGNOSIS — F4322 Adjustment disorder with anxiety: Secondary | ICD-10-CM | POA: Diagnosis not present

## 2013-11-24 DIAGNOSIS — R110 Nausea: Secondary | ICD-10-CM | POA: Diagnosis not present

## 2013-11-24 DIAGNOSIS — M129 Arthropathy, unspecified: Secondary | ICD-10-CM | POA: Diagnosis not present

## 2013-11-24 DIAGNOSIS — Z8639 Personal history of other endocrine, nutritional and metabolic disease: Secondary | ICD-10-CM | POA: Diagnosis not present

## 2013-11-24 DIAGNOSIS — Z0389 Encounter for observation for other suspected diseases and conditions ruled out: Secondary | ICD-10-CM | POA: Diagnosis not present

## 2013-11-24 DIAGNOSIS — Z79899 Other long term (current) drug therapy: Secondary | ICD-10-CM | POA: Insufficient documentation

## 2013-11-24 DIAGNOSIS — Z86718 Personal history of other venous thrombosis and embolism: Secondary | ICD-10-CM | POA: Diagnosis not present

## 2013-11-24 DIAGNOSIS — R61 Generalized hyperhidrosis: Secondary | ICD-10-CM

## 2013-11-24 DIAGNOSIS — Z7982 Long term (current) use of aspirin: Secondary | ICD-10-CM | POA: Insufficient documentation

## 2013-11-24 DIAGNOSIS — F17200 Nicotine dependence, unspecified, uncomplicated: Secondary | ICD-10-CM | POA: Insufficient documentation

## 2013-11-24 DIAGNOSIS — R1011 Right upper quadrant pain: Secondary | ICD-10-CM | POA: Diagnosis not present

## 2013-11-24 DIAGNOSIS — Z872 Personal history of diseases of the skin and subcutaneous tissue: Secondary | ICD-10-CM | POA: Insufficient documentation

## 2013-11-24 DIAGNOSIS — Z8719 Personal history of other diseases of the digestive system: Secondary | ICD-10-CM | POA: Insufficient documentation

## 2013-11-24 DIAGNOSIS — R259 Unspecified abnormal involuntary movements: Secondary | ICD-10-CM | POA: Diagnosis present

## 2013-11-24 LAB — BASIC METABOLIC PANEL
BUN: 14 mg/dL (ref 6–23)
CALCIUM: 9.5 mg/dL (ref 8.4–10.5)
CO2: 21 mEq/L (ref 19–32)
Chloride: 102 mEq/L (ref 96–112)
Creatinine, Ser: 0.72 mg/dL (ref 0.50–1.10)
GFR calc Af Amer: >90 mL/min (ref >90)
GFR, EST NON AFRICAN AMERICAN: 88 mL/min — AB (ref >90)
Glucose, Bld: 146 mg/dL — ABNORMAL HIGH (ref 70–99)
POTASSIUM: 4.1 meq/L (ref 3.7–5.3)
SODIUM: 137 meq/L (ref 137–147)

## 2013-11-24 LAB — HEPATIC FUNCTION PANEL
ALK PHOS: 121 U/L — AB (ref 39–117)
ALT: 16 U/L (ref 0–35)
AST: 15 U/L (ref 0–37)
Albumin: 3.6 g/dL (ref 3.5–5.2)
Total Bilirubin: <0.2 mg/dL — ABNORMAL LOW (ref 0.3–1.2)
Total Protein: 6.9 g/dL (ref 6.0–8.3)

## 2013-11-24 LAB — I-STAT TROPONIN, ED: TROPONIN I, POC: 0 ng/mL (ref 0.00–0.08)

## 2013-11-24 LAB — CBG MONITORING, ED: Glucose-Capillary: 168 mg/dL — ABNORMAL HIGH (ref 70–99)

## 2013-11-24 LAB — CBC
HCT: 41.9 % (ref 36.0–46.0)
Hemoglobin: 14 g/dL (ref 12.0–15.0)
MCH: 30.8 pg (ref 26.0–34.0)
MCHC: 33.4 g/dL (ref 30.0–36.0)
MCV: 92.3 fL (ref 78.0–100.0)
PLATELETS: 316 10*3/uL (ref 150–400)
RBC: 4.54 MIL/uL (ref 3.87–5.11)
RDW: 13.9 % (ref 11.5–15.5)
WBC: 13.1 10*3/uL — ABNORMAL HIGH (ref 4.0–10.5)

## 2013-11-24 LAB — URINALYSIS, ROUTINE W REFLEX MICROSCOPIC
Bilirubin Urine: NEGATIVE
Glucose, UA: NEGATIVE mg/dL
Hgb urine dipstick: NEGATIVE
KETONES UR: NEGATIVE mg/dL
Leukocytes, UA: NEGATIVE
NITRITE: NEGATIVE
Protein, ur: NEGATIVE mg/dL
Specific Gravity, Urine: 1.013 (ref 1.005–1.030)
UROBILINOGEN UA: 0.2 mg/dL (ref 0.0–1.0)
pH: 7 (ref 5.0–8.0)

## 2013-11-24 LAB — CK TOTAL AND CKMB (NOT AT ARMC)
CK TOTAL: 110 U/L (ref 7–177)
CK, MB: 3 ng/mL (ref 0.3–4.0)
Relative Index: 2.7 — ABNORMAL HIGH (ref 0.0–2.5)

## 2013-11-24 LAB — AMMONIA: Ammonia: 27 umol/L (ref 11–60)

## 2013-11-24 LAB — D-DIMER, QUANTITATIVE: D-Dimer, Quant: 0.42 ug/mL-FEU (ref 0.00–0.48)

## 2013-11-24 LAB — LIPASE, BLOOD: Lipase: 25 U/L (ref 11–59)

## 2013-11-24 LAB — TROPONIN I: Troponin I: <0.3 ng/mL (ref <0.30)

## 2013-11-24 MED ORDER — CLONAZEPAM 1 MG PO TABS: 1.0000 mg | ORAL_TABLET | Freq: Two times a day (BID) | ORAL | Status: DC | PRN

## 2013-11-24 MED ORDER — LAMOTRIGINE 25 MG PO TABS: 25.0000 mg | ORAL_TABLET | Freq: Every day | ORAL | Status: DC

## 2013-11-24 MED ORDER — LAMOTRIGINE 25 MG PO TABS
25.0000 mg | ORAL_TABLET | Freq: Every day | ORAL | Status: DC
Start: 2013-11-24 — End: 2013-12-14

## 2013-11-24 NOTE — ED Notes (Signed)
Pt reports around 2100 last night she started to shake and hypervetilate.  Pt reports hx of hypoglycemia and thinks that it's the same tonight.

## 2013-11-24 NOTE — Addendum Note (Signed)
Addended by: Elvina SidleLAUENSTEIN, Cheria Sadiq on: 11/24/2013 07:50 PM   Modules accepted: Orders

## 2013-11-24 NOTE — Discharge Instructions (Signed)
Diaphoresis Sweating is controlled by our nervous system. Sweat glands are found in the skin throughout the body. They exist in higher numbers in the skin of the hands, feet, armpits, and the genital region. Sweating occurs normally when the temperature of your body goes up. Diaphoresis means profuse sweating or perspiration due to an underlying medical condition or an external factor (such as medicines). Hyperhidrosis means excessive sweating that is not usually due to an underlying medical condition, on areas such as the palms, soles, or armpits. Other areas of the body may also be affected. Hyperhidrosis usually begins in childhood or early adolescence. It increases in severity through puberty and into adulthood. Sweaty palms are the most common problem and the most bothersome to people who have hyperhidrosis. CAUSES  Sweating is normally seen with exercise or being in hot surroundings. Not sweating in these conditions would be harmful. Stressful situations can also cause sweating. In some people, stimulation of the sweat glands during stress is overactive. Talking to strangers or shaking someone's hand can produce profuse sweating. Causes of sweating include:  Emotional upset.  Low blood pressure.  Low blood sugar.  Heart problems.  Low blood cell counts.  Certain pain relieving medicines.  Exercise.  Alcohol.  Infection.  Caffeine.  Spicy foods.  Hot flashes.  Overactive thyroid.  Illegal drug use, such as cocaine and amphetamine.  Use of medicines that stimulate parts of the nervous system.  A tumor (pheochromocytoma).  Withdrawal from some medicines or alcohol. DIAGNOSIS  Your caregiver needs to be consulted to make sure excessive sweating is not caused by another condition. Further testing may need to be done. TREATMENT   When hyperhidrosis is caused by another condition, that condition should be treated.  If menopause is the cause, you may wish to talk to your  caregiver about estrogen replacement.  If the hyperhidrosis is a natural happening of the way your body works, certain antiperspirants may help.  If medicines do not work, injections of botulinum toxin type A are sometimes used for underarm sweating.  Your caregiver can usually help you with this problem. Document Released: 12/07/2004 Document Revised: 08/09/2011 Document Reviewed: 06/24/2008 [facility] Patient Information 2015 [employer] [place]. This information is not intended to replace advice given to you by your health care provider. Make sure you discuss any questions you have with your health care provider.   Your EKG, chest xray, electrolytes, liver function, pancrease enzymes, urinalysis and troponin levels were within normal limits.  Your CBC showed a mildly elevated white blood cell count of 13,000 but was otherwise normal. You had a gallbladder ultrasound which was notable for some sludge in the gallbladder but no signs of inflammation or gallstones.

## 2013-11-24 NOTE — Progress Notes (Signed)
The chart was scribed for Tasha SidleKurt Lauenstein, MD, by Yevette EdwardsAngela Bracken, ED Scribe. This patient's care was started at 6:29 PM.   Patient ID: Tasha CanaryJanet P Carnathan MRN: [id number], DOB: [birth year], 63 y.o. Date of Encounter: 11/24/2013, 6:20 PM  Primary Physician: Tasha SidleLAUENSTEIN,KURT, MD  Chief Complaint:  Chief Complaint  Patient presents with   Anxiety    last p.m.  9:00 pm, in [facility] ER last pm, stopped prescription meds as discussed     HPI: 63 y.o. year old female with history below presents with anxiety. [facility]  [facility] with associated weakness and dizziness; she was informed that instead of hypoglycemia, she was experiencing anxiety. [name] reports that the anxiety returned again this afternoon making her feel like she was going "crazy."   The pt was treated in the office here last week, and she states she has ceased the medications as discussed at the visit. She also reports she has decreased her use of Spiriva, using it approximately every three days.    Past Medical History  Diagnosis Date   Psoriasiform dermatitis    Asthma    Allergy    Arthritis    Depression    GERD (gastroesophageal reflux disease)    Hyperlipidemia    Anxiety    Clotting disorder      Home Meds: Prior to Admission medications   Medication Sig Start Date End Date Taking? Authorizing Provider  albuterol (PROVENTIL HFA;VENTOLIN HFA) 108 (90 BASE) MCG/ACT inhaler Inhale 1 puff into the lungs every 6 (six) hours as needed for wheezing or shortness of breath.   Yes Historical Provider, MD  aspirin 325 MG tablet Take 325 mg by mouth daily.   Yes Historical Provider, MD  cholecalciferol (VITAMIN D) 1000 UNITS tablet Take 1,000 Units by mouth daily.   Yes Historical Provider, MD  EPINEPHrine (EPI-PEN) 0.3 mg/0.3 mL SOAJ injection Inject 0.3 mLs (0.3 mg total) into the muscle once. As needed for allergic reaction 01/24/13  Yes Gwenlyn FoundJessica C Copland, MD  tiotropium (SPIRIVA  HANDIHALER) 18 MCG inhalation capsule Place 1 capsule into inhaler and inhale daily. 10/25/13  Yes Tasha SidleKurt Lauenstein, MD    Allergies:  Allergies  Allergen Reactions   Acitretin Swelling and Rash   Other     Unidentified multiple food allergies    Oxycodone-Aspirin    Statins     History   Social History   Marital Status: Single    Spouse Name: Tasha [name]    Number of Children: Tasha [name]   Years of Education: Tasha [name]   Occupational History   Not on file.   Social History Main Topics   Smoking status: Current Every Day Smoker -- 1.00 packs/day    Types: Cigarettes   Smokeless tobacco: Not on file   Alcohol Use: No   Drug Use: No   Sexual Activity: Not on file   Other Topics Concern   Not on file   Social History Narrative   No narrative on file     Review of Systems: Constitutional: negative for chills, fever, night sweats, weight changes, or fatigue  HEENT: negative for vision changes, hearing loss, congestion, rhinorrhea, ST, epistaxis, or sinus pressure Cardiovascular: negative for chest pain or palpitations Respiratory: negative for hemoptysis, wheezing, shortness of breath, or cough Abdominal: negative for abdominal pain, nausea, vomiting, diarrhea, or constipation Dermatological: negative for rash Neurologic: positive for weakness and dizziness; negative for headache or syncope Psych: positive for anxiety  All other systems reviewed and are  otherwise negative with the exception to those above and in the HPI.   Physical Exam: Triage Vitals: Blood pressure 148/90, pulse 97, temperature 97.8 F (36.6 C), temperature source Oral, resp. rate 18, height 5\' 1"  (1.549 m), weight 157 lb 3.2 oz (71.305 kg), SpO2 95.00%., Body mass index is 29.72 kg/(m^2).  General: Well developed, well nourished, in no acute distress. Head: Normocephalic, atraumatic, eyes without discharge, sclera non-icteric, nares are without discharge. Bilateral auditory canals clear, TM's are without  perforation, pearly grey and translucent with reflective cone of light bilaterally. Oral cavity moist, posterior pharynx without exudate, erythema, peritonsillar abscess, or post nasal drip.  Neck: Supple. No thyromegaly. Full ROM. No lymphadenopathy. Lungs: Clear bilaterally to auscultation without wheezes, rales, or rhonchi. Breathing is unlabored. Heart: RRR with S1 S2. No murmurs, rubs, or gallops appreciated. Abdomen: Soft, non-tender, non-distended with normoactive bowel sounds. No hepatomegaly. No rebound/guarding. No obvious abdominal masses. Msk:  Strength and tone normal for age. Extremities/Skin: Warm and dry. No clubbing or cyanosis. No edema. No rashes or suspicious lesions. Neuro: Alert and oriented X 3. Moves all extremities spontaneously. Gait is normal. CNII-XII grossly in tact. Psych:  Responds to questions appropriately with a normal affect.     ASSESSMENT AND PLAN:  63 y.o. year old female with chronic anxiety. She's having panic attacks and anxiety gotten worse since she discontinued some of her medicine. She seems to be better when she is with people. Would've been nice if she seen a psychiatrist when [facility], but they did not deem that necessary.  Adjustment disorder with anxious mood - Plan: clonazePAM (KLONOPIN) 1 MG tablet, lamoTRIgine (LAMICTAL) 25 MG tablet     Signed, Tasha SidleKurt Lauenstein, MD 11/24/2013 6:20 PM

## 2013-11-24 NOTE — Progress Notes (Signed)
Pt here with c/o shaking associated with sob, n/v dizziness. Tonight she began shaking, and thought her blood sugar was low. She was advised to drink some orange juice prior to arrival. On arrival cbg 168. Pt and friends/family at bedside said pt has had a recent hx of dizziness and falling into things along with some intermittent confusion as well. Pt admits to feeling weak at times and feels like she has gas and this has been prevalent for weeks now. NAD at this time but pt says she has a hx of asthma and uses an inhaler.

## 2013-11-24 NOTE — Patient Instructions (Signed)
Panic Attacks Panic attacks are sudden, short-livedsurges of severe anxiety, fear, or discomfort. They may occur for no reason when you are relaxed, when you are anxious, or when you are sleeping. Panic attacks may occur for a number of reasons:   Healthy people occasionally have panic attacks in extreme, life-threatening situations, such as war or natural disasters. Normal anxiety is a protective mechanism of the body that helps us react to danger (fight or flight response).  Panic attacks are often seen with anxiety disorders, such as panic disorder, social anxiety disorder, generalized anxiety disorder, and phobias. Anxiety disorders cause excessive or uncontrollable anxiety. They may interfere with your relationships or other life activities.  Panic attacks are sometimes seen with other mental illnesses, such as depression and posttraumatic stress disorder.  Certain medical conditions, prescription medicines, and drugs of abuse can cause panic attacks. SYMPTOMS  Panic attacks start suddenly, peak within 20 minutes, and are accompanied by four or more of the following symptoms:  Pounding heart or fast heart rate (palpitations).  Sweating.  Trembling or shaking.  Shortness of breath or feeling smothered.  Feeling choked.  Chest pain or discomfort.  Nausea or strange feeling in your stomach.  Dizziness, light-headedness, or feeling like you will faint.  Chills or hot flushes.  Numbness or tingling in your lips or hands and feet.  Feeling that things are not real or feeling that you are not yourself.  Fear of losing control or going crazy.  Fear of dying. Some of these symptoms can mimic serious medical conditions. For example, you may think you are having a heart attack. Although panic attacks can be very scary, they are not life threatening. DIAGNOSIS  Panic attacks are diagnosed through an assessment by your health care provider. Your health care provider will ask  questions about your symptoms, such as where and when they occurred. Your health care provider will also ask about your medical history and use of alcohol and drugs, including prescription medicines. Your health care provider may order blood tests or other studies to rule out a serious medical condition. Your health care provider may refer you to a mental health professional for further evaluation. TREATMENT   Most healthy people who have one or two panic attacks in an extreme, life-threatening situation will not require treatment.  The treatment for panic attacks associated with anxiety disorders or other mental illness typically involves counseling with a mental health professional, medicine, or a combination of both. Your health care provider will help determine what treatment is best for you.  Panic attacks due to physical illness usually go away with treatment of the illness. If prescription medicine is causing panic attacks, talk with your health care provider about stopping the medicine, decreasing the dose, or substituting another medicine.  Panic attacks due to alcohol or drug abuse go away with abstinence. Some adults need professional help in order to stop drinking or using drugs. HOME CARE INSTRUCTIONS   Take all medicines as directed by your health care provider.   Schedule and attend follow-up visits as directed by your health care provider. It is important to keep all your appointments. SEEK MEDICAL CARE IF:  You are not able to take your medicines as prescribed.  Your symptoms do not improve or get worse. SEEK IMMEDIATE MEDICAL CARE IF:   You experience panic attack symptoms that are different than your usual symptoms.  You have serious thoughts about hurting yourself or others.  You are taking medicine for panic attacks and   have a serious side effect. MAKE SURE YOU:  Understand these instructions.  Will watch your condition.  Will get help right away if you are not  doing well or get worse. Document Released: 05/17/2005 Document Revised: 05/22/2013 Document Reviewed: 12/29/2012 [facility] Patient Information 2015 [place], [place]. This information is not intended to replace advice given to you by your health care provider. Make sure you discuss any questions you have with your health care provider. Generalized Anxiety Disorder Generalized anxiety disorder (GAD) is a mental disorder. It interferes with life functions, including relationships, work, and school. GAD is different from normal anxiety, which everyone experiences at some point in their lives in response to specific life events and activities. Normal anxiety actually helps us prepare for and get through these life events and activities. Normal anxiety goes away after the event or activity is over.  GAD causes anxiety that is not necessarily related to specific events or activities. It also causes excess anxiety in proportion to specific events or activities. The anxiety associated with GAD is also difficult to control. GAD can vary from mild to severe. People with severe GAD can have intense waves of anxiety with physical symptoms (panic attacks).  SYMPTOMS The anxiety and worry associated with GAD are difficult to control. This anxiety and worry are related to many life events and activities and also occur more days than not for 6 months or longer. People with GAD also have three or more of the following symptoms (one or more in children):  Restlessness.   Fatigue.  Difficulty concentrating.   Irritability.  Muscle tension.  Difficulty sleeping or unsatisfying sleep. DIAGNOSIS GAD is diagnosed through an assessment by your caregiver. Your caregiver will ask you questions aboutyour mood,physical symptoms, and events in your life. Your caregiver may ask you about your medical history and use of alcohol or drugs, including prescription medications. Your caregiver may also do a physical exam and  blood tests. Certain medical conditions and the use of certain substances can cause symptoms similar to those associated with GAD. Your caregiver may refer you to a mental health specialist for further evaluation. TREATMENT The following therapies are usually used to treat GAD:   Medication--Antidepressant medication usually is prescribed for long-term daily control. Antianxiety medications may be added in severe cases, especially when panic attacks occur.   Talk therapy (psychotherapy)--Certain types of talk therapy can be helpful in treating GAD by providing support, education, and guidance. A form of talk therapy called cognitive behavioral therapy can teach you healthy ways to think about and react to daily life events and activities.  Stress managementtechniques--These include yoga, meditation, and exercise and can be very helpful when they are practiced regularly. A mental health specialist can help determine which treatment is best for you. Some people see improvement with one therapy. However, other people require a combination of therapies. Document Released: 09/11/2012 Document Reviewed: 09/11/2012 [facility] Patient Information 2015 [place], [place]. This information is not intended to replace advice given to you by your health care provider. Make sure you discuss any questions you have with your health care provider.

## 2013-11-24 NOTE — ED Provider Notes (Signed)
CSN: [id number]     Arrival date & time 11/24/13  0021 History   First MD Initiated Contact with Patient 11/24/13 0147     Chief Complaint  Patient presents with  . Shaking     (Consider location/radiation/quality/duration/timing/severity/associated sxs/prior Treatment) HPI 63 yo woman presents after an episode of diffuse diaphoresis associated with a shaking sensation and feeling that she was hyperventilating. This lasted 3 hours. She laid down in bed. She tried to get up and walk to her bathroom and she felt lightheaded/near syncopal. She called two of her good friends who are accompanying her in the ED.   They each say that she seemed to have trouble communicating adequately over the phone. She seemed somwhat confused.   Patient denies feeling SOB, experiencing chest pain or fever.   The patient has had several previous similar episodes over the past year. However, the previous episodes have generally been associated with vomiting. The patient notes that she has been nauseated all day.     Past Medical History  Diagnosis Date  . Psoriasiform dermatitis   . Asthma   . Allergy   . Arthritis   . Depression   . GERD (gastroesophageal reflux disease)   . Hyperlipidemia   . Anxiety   . Clotting disorder    Past Surgical History  Procedure Laterality Date  . Nose surgery    . Tonsillectomy    . Tubal ligation     Family History  Problem Relation Age of Onset  . Cancer Mother     colon  . Heart disease Father   . Cancer Maternal Grandfather     lung  . Cancer Maternal Aunt     breast   History  Substance Use Topics  . Smoking status: Current Every Day Smoker -- 1.00 packs/day    Types: Cigarettes  . Smokeless tobacco: Not on file  . Alcohol Use: No   OB History   Grav Para Term Preterm Abortions TAB SAB Ect Mult Living                 Review of Systems Ten point review of symptoms performed and is negative with the exception of symptoms noted above.      Allergies  Acitretin; Other; Oxycodone-aspirin; and Statins  Home Medications   Prior to Admission medications   Medication Sig Start Date End Date Taking? Authorizing Melania Kirks  albuterol (PROVENTIL HFA;VENTOLIN HFA) 108 (90 BASE) MCG/ACT inhaler Inhale 1 puff into the lungs every 6 (six) hours as needed for wheezing or shortness of breath.   Yes Historical Cornell Bourbon, MD  aspirin 325 MG tablet Take 325 mg by mouth daily.   Yes Historical Marshay Slates, MD  cholecalciferol (VITAMIN D) 1000 UNITS tablet Take 1,000 Units by mouth daily.   Yes Historical Chinita Schimpf, MD  tiotropium (SPIRIVA HANDIHALER) 18 MCG inhalation capsule Place 1 capsule into inhaler and inhale daily. 10/25/13  Yes Elvina SidleKurt Lauenstein, MD  EPINEPHrine (EPI-PEN) 0.3 mg/0.3 mL SOAJ injection Inject 0.3 mLs (0.3 mg total) into the muscle once. As needed for allergic reaction 01/24/13   Gwenlyn FoundJessica C Copland, MD   BP 151/83  Pulse 90  Temp(Src) 98.3 F (36.8 C) (Oral)  Resp 20  SpO2 95% Physical Exam Gen: well developed and well nourished appearing Head: NCAT Eyes: PERL, EOMI Nose: no epistaixis or rhinorrhea Mouth/throat: mucosa is moist and pink Neck: supple, no stridor Lungs: CTA B, no wheezing, rhonchi or rales CV: RRR, no murmur, extremities appear well perfused.  Abd: soft,  obese, mildly tender over the midline epigastrium, nondistended Back: no ttp, no cva ttp Skin: warm and dry Ext: normal to inspection, no dependent edema Neuro: CN ii-xii grossly intact, no focal deficits Psyche; normal affect,  calm and cooperative.   ED Course  Procedures (including critical care time) Labs Review  Results for orders placed during the hospital encounter of 11/24/13 (from the past 24 hour(s))  CBG MONITORING, ED     Status: Abnormal   Collection Time    11/24/13  1:09 AM      Result Value Ref Range   Glucose-Capillary 168 (*) 70 - 99 mg/dL   Comment 1 Notify RN    D-DIMER, QUANTITATIVE     Status: None   Collection Time     11/24/13  1:27 AM      Result Value Ref Range   D-Dimer, Quant 0.42  0.00 - 0.48 ug/mL-FEU  CBC     Status: Abnormal   Collection Time    11/24/13  1:28 AM      Result Value Ref Range   WBC 13.1 (*) 4.0 - 10.5 K/uL   RBC 4.54  3.87 - 5.11 MIL/uL   Hemoglobin 14.0  12.0 - 15.0 g/dL   HCT 16.141.9  09.636.0 - 04.546.0 %   MCV 92.3  78.0 - 100.0 fL   MCH 30.8  26.0 - 34.0 pg   MCHC 33.4  30.0 - 36.0 g/dL   RDW 40.913.9  81.111.5 - 91.415.5 %   Platelets 316  150 - 400 K/uL  BASIC METABOLIC PANEL     Status: Abnormal   Collection Time    11/24/13  1:28 AM      Result Value Ref Range   Sodium 137  137 - 147 mEq/L   Potassium 4.1  3.7 - 5.3 mEq/L   Chloride 102  96 - 112 mEq/L   CO2 21  19 - 32 mEq/L   Glucose, Bld 146 (*) 70 - 99 mg/dL   BUN 14  6 - 23 mg/dL   Creatinine, Ser 7.820.72  0.50 - 1.10 mg/dL   Calcium 9.5  8.4 - 95.610.5 mg/dL   GFR calc non Af Amer 88 (*) >90 mL/min   GFR calc Af Amer >90  >90 mL/min  TROPONIN I     Status: None   Collection Time    11/24/13  1:28 AM      Result Value Ref Range   Troponin I <0.30  <0.30 ng/mL  CK TOTAL AND CKMB     Status: Abnormal   Collection Time    11/24/13  1:28 AM      Result Value Ref Range   Total CK 110  7 - 177 U/L   CK, MB 3.0  0.3 - 4.0 ng/mL   Relative Index 2.7 (*) 0.0 - 2.5  LIPASE, BLOOD     Status: None   Collection Time    11/24/13  1:28 AM      Result Value Ref Range   Lipase 25  11 - 59 U/L  HEPATIC FUNCTION PANEL     Status: Abnormal   Collection Time    11/24/13  2:06 AM      Result Value Ref Range   Total Protein 6.9  6.0 - 8.3 g/dL   Albumin 3.6  3.5 - 5.2 g/dL   AST 15  0 - 37 U/L   ALT 16  0 - 35 U/L   Alkaline Phosphatase 121 (*) 39 - 117  U/L   Total Bilirubin <0.2 (*) 0.3 - 1.2 mg/dL   Bilirubin, Direct <5.6  0.0 - 0.3 mg/dL   Indirect Bilirubin NOT CALCULATED  0.3 - 0.9 mg/dL  AMMONIA     Status: None   Collection Time    11/24/13  2:06 AM      Result Value Ref Range   Ammonia 27  11 - 60 umol/L  URINALYSIS,  ROUTINE W REFLEX MICROSCOPIC     Status: None   Collection Time    11/24/13  2:22 AM      Result Value Ref Range   Color, Urine YELLOW  YELLOW   APPearance CLEAR  CLEAR   Specific Gravity, Urine 1.013  1.005 - 1.030   pH 7.0  5.0 - 8.0   Glucose, UA NEGATIVE  NEGATIVE mg/dL   Hgb urine dipstick NEGATIVE  NEGATIVE   Bilirubin Urine NEGATIVE  NEGATIVE   Ketones, ur NEGATIVE  NEGATIVE mg/dL   Protein, ur NEGATIVE  NEGATIVE mg/dL   Urobilinogen, UA 0.2  0.0 - 1.0 mg/dL   Nitrite NEGATIVE  NEGATIVE   Leukocytes, UA NEGATIVE  NEGATIVE   EKG: nsr, no acute ischemic changes, normal intervals, normal axis, normal qrs complex  CXR: normal cardiac silloute, normal appearing mediastinum, no infiltrates, no acute process identified.   Right upper quadrant ultrasound: Sludge, no signs of acute cholecystitis  MDM   Extensive ED work up is non-diagnostic but notable for mild leukocytosis. We have excluded pneumonia, pulmonary embolus, cholecystitis, pancreatitis, UTI as cause of sx. Patient has been asymptomatic for the past 7 hrs in ED. We are now awaiting delta troponin and, if negative, we can rule out ACS and will d/c the patient. Patient will f/u with her PCP on Monday and return for red flag sx.     Brandt Loosen, MD 11/24/13 [phone]

## 2013-12-08 ENCOUNTER — Encounter (HOSPITAL_COMMUNITY): Payer: Self-pay | Admitting: Emergency Medicine

## 2013-12-08 ENCOUNTER — Emergency Department (INDEPENDENT_AMBULATORY_CARE_PROVIDER_SITE_OTHER)
Admission: EM | Admit: 2013-12-08 | Discharge: 2013-12-08 | Disposition: A | Discharge status: Home / Self Care | Payer: Medicare Other | Source: Home / Self Care | Attending: Emergency Medicine | Admitting: Emergency Medicine

## 2013-12-08 DIAGNOSIS — F411 Generalized anxiety disorder: Secondary | ICD-10-CM | POA: Diagnosis not present

## 2013-12-08 DIAGNOSIS — F419 Anxiety disorder, unspecified: Secondary | ICD-10-CM

## 2013-12-08 MED ORDER — CITALOPRAM HYDROBROMIDE 20 MG PO TABS: 20.0000 mg | ORAL_TABLET | Freq: Every day | ORAL | Status: DC

## 2013-12-08 MED ORDER — CLONAZEPAM 1 MG PO TABS: 1.0000 mg | ORAL_TABLET | Freq: Two times a day (BID) | ORAL | Status: DC

## 2013-12-08 NOTE — Discharge Instructions (Signed)

## 2013-12-08 NOTE — ED Provider Notes (Signed)
Chief Complaint   Chief Complaint  Patient presents with  . Panic Attack    History of Present Illness   Tasha [name] is a 63 year old female who has a history of anxiety, alcoholism, drug reaction. She is being followed by Dr. Collins Scotland and Dr. Emerson Monte. She decided in June to stop all of her medication, so she tapered off the citalopram, hydroxyzine, and Requip. She was taking the Requip for restless leg syndrome. She thought these medications are causing side effects such as muscle aches. She also is taking clonazepam, and tapered off this as well. She gave the clonazepam to Dr. Nolen Mu to dispose of. She had done well up until this morning around 3:25 AM when she was awakened suddenly in the middle of a dream. She states this was not a bad dream, but she noticed profound anxiety, her heart was racing, and she noted tightness in her chest. She's had episodes like this many times in the past. She was unable to contact Dr. Faustino Congress or Dr. Nolen Mu today, she thinks she would like to be placed back in the citalopram and clonazepam. She did not want to take the Requip or hydroxyzine. She denies use of alcohol or drugs. She denies depression, and has no thoughts of harm to herself or other.  Review of Systems   Other than as noted above, the patient denies any of the following symptoms: Systemic:  No fever, chills, fatigue, weight loss or gain. Resp:  No shortness of breath. Cardiovasc:  No chest pain, palpitations, dizziness, or syncope. GI:  No abdominal pain, nausea, vomiting, anorexia, diarrhea, or constipation. Neuro:  No headache, paresthesias, or tremor. Psych:  No sadness, depression, crying, anxiety, panic, sleep disturbance, or suicidal or homicidal ideation.  No hallucinations or delusions.  PMFSH   Past medical history, family history, social history, meds, and allergies were reviewed.    Physical Examination     Vital signs:  BP 148/67  Pulse 83  Temp(Src)  98.3 F (36.8 C) (Oral)  Resp 18  SpO2 95% Gen:  Alert, oriented, in no distress. Lungs:  No respiratory distress.  Breath sounds clear and equal bilaterally.  No wheezes, rales, or rhonchi. Heart:  Regular rthythm.  No gallops, murmers, clicks or rubs. Abdomen:  Soft, flat and nontender.  No organomegaly or mass. Neuro:  Alert and oriented times 3. Speech clear, fluent and appropriate.  Cranial nerves intact.  No focal weakness. Psych:  Mood and affect normal.  She has rapid, dispersive, pressured speech.  Thought content normal with no suicidal or homicidal ideation.  No paranoia, hallucinations, or delusions.  Memory, insight, and judgement normal.  Assessment   The encounter diagnosis was Anxiety.   In retrospect, I don't think is a good idea for her to go off her medication. It will take a week or 2 for the citalopram to really take effect. In the meantime she'll need to use a bridge of the clonazepam. I cautioned her not to use this for more than a week, since she has a history of alcohol and drug abuse. I suggested she followup with Dr. Faustino Congress within the next week.  Plan   1.  Meds:  The following meds were prescribed:   Discharge Medication List as of 12/08/2013  2:55 PM    START taking these medications   Details  citalopram (CELEXA) 20 MG tablet Take 1 tablet (20 mg total) by mouth daily., Starting 12/08/2013, Until Discontinued, Normal    !! clonazePAM (KLONOPIN)  1 MG tablet Take 1 tablet (1 mg total) by mouth 2 (two) times daily., Starting 12/08/2013, Until Discontinued, Print     !! - Potential duplicate medications found. Please discuss with provider.      2.  Patient Education/Counseling:  The patient was given appropriate handouts, self care instructions, and instructed in symptomatic relief.    3.  Follow up:  The patient was told to follow up if no better in 3 to 4 days, if becoming worse in any way, and given some red flag symptoms such as worsening symptoms or  suicidal ideation which would prompt immediate return.       Reuben Likesavid C Ofilia Rayon, MD 12/08/13 [phone]

## 2013-12-08 NOTE — ED Notes (Signed)
Patient states woke up at about 3am today having a panic attack Recently came off all of her medications including requip and clonazepam Is requesting a prescription to help with anxiety

## 2013-12-14 ENCOUNTER — Ambulatory Visit (INDEPENDENT_AMBULATORY_CARE_PROVIDER_SITE_OTHER): Payer: Medicare Other | Admitting: Family Medicine

## 2013-12-14 VITALS — BP 122/70 | HR 88 | Temp 98.9°F | Resp 18 | Ht 61.0 in | Wt 161.0 lb

## 2013-12-14 DIAGNOSIS — F3289 Other specified depressive episodes: Secondary | ICD-10-CM

## 2013-12-14 DIAGNOSIS — L299 Pruritus, unspecified: Secondary | ICD-10-CM

## 2013-12-14 DIAGNOSIS — F19239 Other psychoactive substance dependence with withdrawal, unspecified: Secondary | ICD-10-CM

## 2013-12-14 DIAGNOSIS — F32A Depression, unspecified: Secondary | ICD-10-CM

## 2013-12-14 DIAGNOSIS — F329 Major depressive disorder, single episode, unspecified: Secondary | ICD-10-CM

## 2013-12-14 DIAGNOSIS — F1920 Other psychoactive substance dependence, uncomplicated: Secondary | ICD-10-CM

## 2013-12-14 DIAGNOSIS — F19939 Other psychoactive substance use, unspecified with withdrawal, unspecified: Secondary | ICD-10-CM

## 2013-12-14 MED ORDER — CITALOPRAM HYDROBROMIDE 20 MG PO TABS: 20.0000 mg | ORAL_TABLET | Freq: Every day | ORAL | Status: DC

## 2013-12-14 MED ORDER — HYDROXYZINE HCL 10 MG PO TABS: 10.0000 mg | ORAL_TABLET | Freq: Every evening | ORAL | Status: DC | PRN

## 2013-12-14 NOTE — Progress Notes (Signed)
Subjective:    Patient ID: Tasha [name], female    DOB: 05/01/1951, 63 y.o.   MRN: [id number] This chart was scribed for Tasha Sidle, MD by Marica Otter, ED Scribe at [facility]. This patient was seen in room Room 8 and the patient's care was started at 4:22 PM.     Patient ID: Tasha [name] MRN: [id number], DOB: 1950/08/30, 63 y.o. Date of Encounter: 12/14/2013, 4:22 PM  Primary Physician: Tasha Sidle, MD  Chief Complaint: F/U on panic attacks   HPI: 63 y.o. year old female with history below presents for a 3 week follow-up. Last month pt decided to stop all of her meds.  Specifically, she tapered off the citalopram, hydroxyzine, clonazepam, and Requip. She states she has not been herself since. Pt reports, she has been having bad dreams and panic disorders since tapering off her meds. Pt further reports that she was treated at the ED on 12/08/13 for a panic attack. Specifically, pt reports she was asleep and awoke suddenly in the middle of a dream. Pt states when she woke up she felt profound anxiety and began to have a panic attack. Pt reports she wishes to be placed back on the citalopram and clonazepam.   Past Medical History  Diagnosis Date  . Psoriasiform dermatitis   . Asthma   . Allergy   . Arthritis   . Depression   . GERD (gastroesophageal reflux disease)   . Hyperlipidemia   . Anxiety   . Clotting disorder      Home Meds: Prior to Admission medications   Medication Sig Start Date End Date Taking? Authorizing Provider  albuterol (PROVENTIL HFA;VENTOLIN HFA) 108 (90 BASE) MCG/ACT inhaler Inhale 1 puff into the lungs every 6 (six) hours as needed for wheezing or shortness of breath.   Yes Historical Provider, MD  aspirin 325 MG tablet Take 325 mg by mouth daily.   Yes Historical Provider, MD  cholecalciferol (VITAMIN D) 1000 UNITS tablet Take 1,000 Units by mouth daily.   Yes Historical Provider, MD  clonazePAM (KLONOPIN) 1 MG tablet Take 1  tablet (1 mg total) by mouth 2 (two) times daily as needed for anxiety. 11/24/13  Yes Tasha Sidle, MD  clonazePAM (KLONOPIN) 1 MG tablet Take 1 tablet (1 mg total) by mouth 2 (two) times daily. 12/08/13  Yes Reuben Likes, MD  EPINEPHrine (EPI-PEN) 0.3 mg/0.3 mL SOAJ injection Inject 0.3 mLs (0.3 mg total) into the muscle once. As needed for allergic reaction 01/24/13  Yes Gwenlyn Found Copland, MD  tiotropium (SPIRIVA HANDIHALER) 18 MCG inhalation capsule Place 1 capsule into inhaler and inhale daily. 10/25/13  Yes Tasha Sidle, MD    Allergies:  Allergies  Allergen Reactions  . Acitretin Swelling and Rash  . Other     Unidentified multiple food allergies   . Oxycodone-Aspirin   . Statins     History   Social History  . Marital Status: Single    Spouse Name: N/A    Number of Children: N/A  . Years of Education: N/A   Occupational History  . Not on file.   Social History Main Topics  . Smoking status: Current Every Day Smoker -- 1.00 packs/day    Types: Cigarettes  . Smokeless tobacco: Not on file  . Alcohol Use: No  . Drug Use: No  . Sexual Activity: Not on file   Other Topics Concern  . Not on file   Social History Narrative  . No  narrative on file     Review of Systems: Constitutional: negative for chills, fever, night sweats, weight changes, or fatigue  HEENT: negative for vision changes, hearing loss, congestion, rhinorrhea, ST, epistaxis, or sinus pressure Cardiovascular: negative for chest pain or palpitations Respiratory: negative for hemoptysis, wheezing, shortness of breath, or cough Abdominal: negative for abdominal pain, nausea, vomiting, diarrhea, or constipation Dermatological: negative for rash Neurologic: negative for headache, dizziness, or syncope All other systems reviewed and are otherwise negative with the exception to those above and in the HPI. Psych: Positive for anxiety.  Skin: Positive for patches and purple patches on UE bilaterally.    Physical Exam: Blood pressure 122/70, pulse 88, temperature 98.9 F (37.2 C), resp. rate 18, height 5\' 1"  (1.549 m), weight 161 lb (73.029 kg), SpO2 95.00%., Body mass index is 30.44 kg/(m^2). General: Well developed, well nourished, in no acute distress. Head: Normocephalic, atraumatic, eyes without discharge, sclera non-icteric, nares are without discharge. Bilateral auditory canals clear, TM's are without perforation, pearly grey and translucent with reflective cone of light bilaterally. Oral cavity moist, posterior pharynx without exudate, erythema, peritonsillar abscess, or post nasal drip.  Neck: Supple. No thyromegaly. Full ROM. No lymphadenopathy. Lungs: Clear bilaterally to auscultation without wheezes, rales, or rhonchi. Breathing is unlabored. Heart: RRR with S1 S2. No murmurs, rubs, or gallops appreciated. Abdomen: Soft, non-tender, non-distended with normoactive bowel sounds. No hepatomegaly. No rebound/guarding. No obvious abdominal masses. Msk:  Strength and tone normal for age. Extremities/Skin: Warm and dry. No clubbing or cyanosis. No edema. No rashes or suspicious lesions. Patient has obvious skin atrophy in her neck with multiple areas of psoriatic well circumscribed plaques. These are all showing some improvement since last visit. Neuro: Alert and oriented X 3. Moves all extremities spontaneously. Gait is normal. CNII-XII grossly in tact. Psych:  Responds to questions appropriately with a normal affect.   I spent 30 minutes again reviewing her past history of substance abuse, troubles with withdrawal from her citalopram, her anxiety and depression. ASSESSMENT AND PLAN:  63 y.o. year old female with    Depression - Plan: citalopram (CELEXA) 20 MG tablet  Pruritus - Plan: hydrOXYzine (ATARAX/VISTARIL) 10 MG tablet  Withdrawal from other psychoactive substance    Signed, Tasha SidleKurt Marlena Barbato, MD 12/14/2013 4:22 PM      HPI    Review of Systems     Objective:    Physical Exam        Assessment & Plan:

## 2014-03-04 DIAGNOSIS — M9903 Segmental and somatic dysfunction of lumbar region: Secondary | ICD-10-CM | POA: Diagnosis not present

## 2014-03-04 DIAGNOSIS — M791 Myalgia: Secondary | ICD-10-CM | POA: Diagnosis not present

## 2014-03-04 DIAGNOSIS — M5136 Other intervertebral disc degeneration, lumbar region: Secondary | ICD-10-CM | POA: Diagnosis not present

## 2014-03-04 DIAGNOSIS — M545 Low back pain: Secondary | ICD-10-CM | POA: Diagnosis not present

## 2014-03-06 DIAGNOSIS — Z86010 Personal history of colonic polyps: Secondary | ICD-10-CM | POA: Diagnosis not present

## 2014-03-06 DIAGNOSIS — Z800 Family history of malignant neoplasm of digestive organs: Secondary | ICD-10-CM | POA: Diagnosis not present

## 2014-03-06 DIAGNOSIS — Z09 Encounter for follow-up examination after completed treatment for conditions other than malignant neoplasm: Secondary | ICD-10-CM | POA: Diagnosis not present

## 2014-03-06 DIAGNOSIS — K5730 Diverticulosis of large intestine without perforation or abscess without bleeding: Secondary | ICD-10-CM | POA: Diagnosis not present

## 2014-03-06 LAB — HM COLONOSCOPY

## 2014-03-07 DIAGNOSIS — M791 Myalgia: Secondary | ICD-10-CM | POA: Diagnosis not present

## 2014-03-07 DIAGNOSIS — M545 Low back pain: Secondary | ICD-10-CM | POA: Diagnosis not present

## 2014-03-07 DIAGNOSIS — M5136 Other intervertebral disc degeneration, lumbar region: Secondary | ICD-10-CM | POA: Diagnosis not present

## 2014-03-07 DIAGNOSIS — M9903 Segmental and somatic dysfunction of lumbar region: Secondary | ICD-10-CM | POA: Diagnosis not present

## 2014-03-11 DIAGNOSIS — M5136 Other intervertebral disc degeneration, lumbar region: Secondary | ICD-10-CM | POA: Diagnosis not present

## 2014-03-11 DIAGNOSIS — M9903 Segmental and somatic dysfunction of lumbar region: Secondary | ICD-10-CM | POA: Diagnosis not present

## 2014-03-11 DIAGNOSIS — M545 Low back pain: Secondary | ICD-10-CM | POA: Diagnosis not present

## 2014-03-11 DIAGNOSIS — M791 Myalgia: Secondary | ICD-10-CM | POA: Diagnosis not present

## 2014-03-13 DIAGNOSIS — M791 Myalgia: Secondary | ICD-10-CM | POA: Diagnosis not present

## 2014-03-13 DIAGNOSIS — M9903 Segmental and somatic dysfunction of lumbar region: Secondary | ICD-10-CM | POA: Diagnosis not present

## 2014-03-13 DIAGNOSIS — M545 Low back pain: Secondary | ICD-10-CM | POA: Diagnosis not present

## 2014-03-13 DIAGNOSIS — M5136 Other intervertebral disc degeneration, lumbar region: Secondary | ICD-10-CM | POA: Diagnosis not present

## 2014-03-14 ENCOUNTER — Encounter: Payer: Self-pay | Admitting: *Deleted

## 2014-03-19 DIAGNOSIS — M545 Low back pain: Secondary | ICD-10-CM | POA: Diagnosis not present

## 2014-03-19 DIAGNOSIS — M5136 Other intervertebral disc degeneration, lumbar region: Secondary | ICD-10-CM | POA: Diagnosis not present

## 2014-03-19 DIAGNOSIS — M9903 Segmental and somatic dysfunction of lumbar region: Secondary | ICD-10-CM | POA: Diagnosis not present

## 2014-03-19 DIAGNOSIS — M791 Myalgia: Secondary | ICD-10-CM | POA: Diagnosis not present

## 2014-03-26 DIAGNOSIS — M5136 Other intervertebral disc degeneration, lumbar region: Secondary | ICD-10-CM | POA: Diagnosis not present

## 2014-03-26 DIAGNOSIS — M791 Myalgia: Secondary | ICD-10-CM | POA: Diagnosis not present

## 2014-03-26 DIAGNOSIS — M545 Low back pain: Secondary | ICD-10-CM | POA: Diagnosis not present

## 2014-03-26 DIAGNOSIS — M9903 Segmental and somatic dysfunction of lumbar region: Secondary | ICD-10-CM | POA: Diagnosis not present

## 2014-03-28 DIAGNOSIS — M791 Myalgia: Secondary | ICD-10-CM | POA: Diagnosis not present

## 2014-03-28 DIAGNOSIS — M545 Low back pain: Secondary | ICD-10-CM | POA: Diagnosis not present

## 2014-03-28 DIAGNOSIS — M9903 Segmental and somatic dysfunction of lumbar region: Secondary | ICD-10-CM | POA: Diagnosis not present

## 2014-03-28 DIAGNOSIS — M5136 Other intervertebral disc degeneration, lumbar region: Secondary | ICD-10-CM | POA: Diagnosis not present

## 2014-04-01 DIAGNOSIS — M9903 Segmental and somatic dysfunction of lumbar region: Secondary | ICD-10-CM | POA: Diagnosis not present

## 2014-04-01 DIAGNOSIS — M5136 Other intervertebral disc degeneration, lumbar region: Secondary | ICD-10-CM | POA: Diagnosis not present

## 2014-04-01 DIAGNOSIS — M791 Myalgia: Secondary | ICD-10-CM | POA: Diagnosis not present

## 2014-04-01 DIAGNOSIS — M545 Low back pain: Secondary | ICD-10-CM | POA: Diagnosis not present

## 2014-04-08 DIAGNOSIS — M9903 Segmental and somatic dysfunction of lumbar region: Secondary | ICD-10-CM | POA: Diagnosis not present

## 2014-04-08 DIAGNOSIS — M545 Low back pain: Secondary | ICD-10-CM | POA: Diagnosis not present

## 2014-04-08 DIAGNOSIS — M791 Myalgia: Secondary | ICD-10-CM | POA: Diagnosis not present

## 2014-04-08 DIAGNOSIS — M5136 Other intervertebral disc degeneration, lumbar region: Secondary | ICD-10-CM | POA: Diagnosis not present

## 2014-04-10 DIAGNOSIS — M9903 Segmental and somatic dysfunction of lumbar region: Secondary | ICD-10-CM | POA: Diagnosis not present

## 2014-04-10 DIAGNOSIS — M5136 Other intervertebral disc degeneration, lumbar region: Secondary | ICD-10-CM | POA: Diagnosis not present

## 2014-04-10 DIAGNOSIS — M791 Myalgia: Secondary | ICD-10-CM | POA: Diagnosis not present

## 2014-04-10 DIAGNOSIS — M545 Low back pain: Secondary | ICD-10-CM | POA: Diagnosis not present

## 2014-04-15 DIAGNOSIS — M791 Myalgia: Secondary | ICD-10-CM | POA: Diagnosis not present

## 2014-04-15 DIAGNOSIS — M9903 Segmental and somatic dysfunction of lumbar region: Secondary | ICD-10-CM | POA: Diagnosis not present

## 2014-04-15 DIAGNOSIS — M5136 Other intervertebral disc degeneration, lumbar region: Secondary | ICD-10-CM | POA: Diagnosis not present

## 2014-04-15 DIAGNOSIS — M545 Low back pain: Secondary | ICD-10-CM | POA: Diagnosis not present

## 2014-04-17 DIAGNOSIS — M5136 Other intervertebral disc degeneration, lumbar region: Secondary | ICD-10-CM | POA: Diagnosis not present

## 2014-04-17 DIAGNOSIS — M545 Low back pain: Secondary | ICD-10-CM | POA: Diagnosis not present

## 2014-04-17 DIAGNOSIS — M791 Myalgia: Secondary | ICD-10-CM | POA: Diagnosis not present

## 2014-04-17 DIAGNOSIS — M9903 Segmental and somatic dysfunction of lumbar region: Secondary | ICD-10-CM | POA: Diagnosis not present

## 2014-04-22 DIAGNOSIS — M9903 Segmental and somatic dysfunction of lumbar region: Secondary | ICD-10-CM | POA: Diagnosis not present

## 2014-04-22 DIAGNOSIS — M791 Myalgia: Secondary | ICD-10-CM | POA: Diagnosis not present

## 2014-04-22 DIAGNOSIS — M545 Low back pain: Secondary | ICD-10-CM | POA: Diagnosis not present

## 2014-04-22 DIAGNOSIS — M5136 Other intervertebral disc degeneration, lumbar region: Secondary | ICD-10-CM | POA: Diagnosis not present

## 2014-04-24 ENCOUNTER — Ambulatory Visit (INDEPENDENT_AMBULATORY_CARE_PROVIDER_SITE_OTHER): Payer: Medicare Other | Admitting: Family Medicine

## 2014-04-24 VITALS — BP 164/82 | HR 76 | Temp 98.1°F | Resp 20 | Ht 61.25 in | Wt 159.6 lb

## 2014-04-24 DIAGNOSIS — I10 Essential (primary) hypertension: Secondary | ICD-10-CM | POA: Diagnosis not present

## 2014-04-24 DIAGNOSIS — J4520 Mild intermittent asthma, uncomplicated: Secondary | ICD-10-CM

## 2014-04-24 MED ORDER — LISINOPRIL-HYDROCHLOROTHIAZIDE 20-12.5 MG PO TABS: 1.0000 | ORAL_TABLET | Freq: Every day | ORAL | Status: DC

## 2014-04-24 MED ORDER — ALBUTEROL SULFATE HFA 108 (90 BASE) MCG/ACT IN AERS: 1.0000 | INHALATION_SPRAY | Freq: Four times a day (QID) | RESPIRATORY_TRACT | Status: DC | PRN

## 2014-04-24 NOTE — Patient Instructions (Signed)

## 2014-04-24 NOTE — Progress Notes (Signed)
Subjective:   This chart was scribed for Tasha [name] Devani Odonnel, MD by Arlan OrganAshley [name], [facility] Sc[facility]. This patient was seen in room 12 and the patient's care was started 5:23 PM.    Patient ID: Tasha CanaryJanet P [name], female    DOB: Apr 13, 1951, 63 y.o.   MRN: [id number]  Chief Complaint  Patient presents with  . Hypertension    184/91 yesterday- increasing over the last year   . Medication Refill    Albuterol      HPI  HPI Comments: Tasha CanaryJanet P Casad is a 63 y.o. female with a PMHx of psoriasiform dermatitis, GERD, arthritis, and hyperlipidemia who presents to [facility] here for hypertension that has been ongoing for several months. Pt states blood pressure was 184/91 yesterday and reports increase in readings over the last year overall. No associated symptoms at this time. Pt with known allergies to acitretin, oxycodone-aspirin, and statins.  Tasha [name] is also requesting a refill of Albuterol today.  Pt works in Advertising   Allergies  Allergen Reactions  . Acitretin Swelling and Rash  . Other     Unidentified multiple food allergies   . Oxycodone-Aspirin   . Statins     Patient Active Problem List   Diagnosis Date Noted  . DVT (deep venous thrombosis) 03/10/2012  . ELEVATED BP READING WITHOUT DX HYPERTENSION 11/18/2009  . PSORIASIS 09/09/2009  . HYPERGLYCEMIA 02/10/2009  . ENDOMETRIAL POLYP 10/01/2008  . HYPERLIPIDEMIA 04/17/2008  . DEPRESSION 04/17/2008  . ASTHMA 04/17/2008  . SCOLIOSIS 04/17/2008  . COLONIC POLYPS, HX OF 04/17/2008   Past Medical History  Diagnosis Date  . Psoriasiform dermatitis   . Asthma   . Allergy   . Arthritis   . Depression   . GERD (gastroesophageal reflux disease)   . Hyperlipidemia   . Anxiety   . Clotting disorder    Past Surgical History  Procedure Laterality Date  . Nose surgery    . Tonsillectomy    . Tubal ligation     Allergies  Allergen Reactions  . Acitretin Swelling and Rash  . Other      Unidentified multiple food allergies   . Oxycodone-Aspirin   . Statins    Prior to Admission medications   Medication Sig Start Date End Date Taking? Authorizing Provider  albuterol (PROVENTIL HFA;VENTOLIN HFA) 108 (90 BASE) MCG/ACT inhaler Inhale 1 puff into the lungs every 6 (six) hours as needed for wheezing or shortness of breath.   Yes Historical Provider, MD  aspirin 325 MG tablet Take 325 mg by mouth daily.   Yes Historical Provider, MD  b complex vitamins tablet Take 1 tablet by mouth daily.   Yes Historical Provider, MD  cholecalciferol (VITAMIN D) 1000 UNITS tablet Take 1,000 Units by mouth daily.   Yes Historical Provider, MD  citalopram (CELEXA) 20 MG tablet Take 1 tablet (20 mg total) by mouth daily. 12/14/13  Yes Tasha [name] Itzael Liptak, MD  gabapentin (NEURONTIN) 600 MG tablet Take 600 mg by mouth 3 (three) times daily.   Yes Historical Provider, MD  hydrOXYzine (ATARAX/VISTARIL) 10 MG tablet Take 1 tablet (10 mg total) by mouth at bedtime as needed. 12/14/13  Yes Tasha [name] Velvia Mehrer, MD  Magnesium 250 MG TABS Take 250 mg by mouth 2 (two) times daily.   Yes Historical Provider, MD  tiotropium (SPIRIVA HANDIHALER) 18 MCG inhalation capsule Place 1 capsule into inhaler and inhale daily. 10/25/13  Yes Tasha [name] Lashonna Rieke, MD  clonazePAM (KLONOPIN) 1 MG tablet  Take 1 tablet (1 mg total) by mouth 2 (two) times daily as needed for anxiety. Patient not taking: Reported on 04/24/2014 11/24/13   Tasha [name] Stace Peace, MD  clonazePAM (KLONOPIN) 1 MG tablet Take 1 tablet (1 mg total) by mouth 2 (two) times daily. Patient not taking: Reported on 04/24/2014 12/08/13   Reuben Likesavid C Keller, MD  EPINEPHrine (EPI-PEN) 0.3 mg/0.3 mL SOAJ injection Inject 0.3 mLs (0.3 mg total) into the muscle once. As needed for allergic reaction Patient not taking: Reported on 04/24/2014 01/24/13   Pearline CablesJessica C Copland, MD     Review of Systems  Constitutional: Negative for fever and chills.  Neurological: Negative for dizziness.     Triage Vitals: BP 164/82 mmHg  Pulse 76  Temp(Src) 98.1 F (36.7 C) (Oral)  Resp 20  Ht 5' 1.25" (1.556 m)  Wt 159 lb 9.6 oz (72.394 kg)  BMI 29.90 kg/m2  SpO2 94%   Objective:   Physical Exam  Constitutional: She is oriented to person, place, and time. She appears well-developed and well-nourished.  HENT:  Head: Normocephalic.  Eyes: EOM are normal.  Neck: Normal range of motion.  Cardiovascular:  Blood pressure 150/80 during physical examination  Pulmonary/Chest: Effort normal.  Abdominal: She exhibits no distension.  Musculoskeletal: Normal range of motion.  Neurological: She is alert and oriented to person, place, and time.  Psychiatric: She has a normal mood and affect.  Nursing note and vitals reviewed.       Assessment & Plan:   I personally performed the services described in this documentation, which was scribed in my presence. The recorded information has been reviewed and is accurate.  Essential hypertension - Plan: lisinopril-hydrochlorothiazide (ZESTORETIC) 20-12.5 MG per tablet  Asthma, chronic, mild intermittent, uncomplicated - Plan: albuterol (PROVENTIL HFA;VENTOLIN HFA) 108 (90 BASE) MCG/ACT inhaler  Signed, Tasha [name] Allye Hoyos, MD

## 2014-05-07 ENCOUNTER — Ambulatory Visit (INDEPENDENT_AMBULATORY_CARE_PROVIDER_SITE_OTHER): Payer: Medicare Other | Admitting: Emergency Medicine

## 2014-05-07 VITALS — BP 100/60 | HR 73 | Temp 98.3°F | Resp 16 | Ht 61.0 in | Wt 151.0 lb

## 2014-05-07 DIAGNOSIS — Z77011 Contact with and (suspected) exposure to lead: Secondary | ICD-10-CM

## 2014-05-07 DIAGNOSIS — Z23 Encounter for immunization: Secondary | ICD-10-CM | POA: Diagnosis not present

## 2014-05-07 LAB — POCT CBC
Granulocyte percent: 56.9 %G (ref 37–80)
HCT, POC: 44.5 % (ref 37.7–47.9)
HEMOGLOBIN: 14.5 g/dL (ref 12.2–16.2)
LYMPH, POC: 2.9 (ref 0.6–3.4)
MCH: 29.7 pg (ref 27–31.2)
MCHC: 32.5 g/dL (ref 31.8–35.4)
MCV: 91.5 fL (ref 80–97)
MID (CBC): 0.9 (ref 0–0.9)
MPV: 7.6 fL (ref 0–99.8)
POC Granulocyte: 5 (ref 2–6.9)
POC LYMPH %: 33.3 % (ref 10–50)
POC MID %: 9.8 % (ref 0–12)
Platelet Count, POC: 351 10*3/uL (ref 142–424)
RBC: 4.86 M/uL (ref 4.04–5.48)
RDW, POC: 14.8 %
WBC: 8.7 10*3/uL (ref 4.6–10.2)

## 2014-05-07 NOTE — Addendum Note (Signed)
Addended by: Isaac BlissGALLOWAY, Hayven Fatima J on: 05/07/2014 05:57 PM   Modules accepted: Orders

## 2014-05-07 NOTE — Patient Instructions (Signed)
Lead Poisoning  Your child's caregiver wants you to have information about lead poisoning. During your child's visit, your child's caregiver may ask you some questions regarding your family's exposure to lead, or may ask you to have your child's blood tested for lead. Lead is found in lead-based paints which were used in most houses built before 1978. It also is present in dust and soil contaminated by:  · Paint.  · Gasoline.  · Other industrial chemicals.  Lead is also present in water that flows through lead pipes and plumbing fixtures. Improperly treated ceramic ware and lead crystal can increase lead content in food. Lead poisoning is preventable.  If there are high levels of lead detected in the body, it can cause children to have problems with their:  · Brain.  · Kidneys.  · Bone marrow (the soft tissue inside bones).  Even if there are lower levels of lead detected in the body, behavior problems and learning difficulties can occur.  SYMPTOMS   Symptoms of high lead levels can include belly pain, headaches, vomiting, confusion, muscle weakness, seizures, hair loss and low red blood cell count (anemia).  TREATMENT   Treatment includes removing the sources of lead in the environment. If the blood lead levels are over 45 micrograms (mcg), a therapy may be needed to bind the lead in the blood and help remove it (chelation therapy). Other factors in treatment include good nutrition with foods high in calcium and iron. Repeat blood lead levels and other tests are used to follow the progress of treatment. Be sure to see your child's caregiver for further care as recommended.  Contact your local health department. They may be able to help you and your family find lead problems in your home and tell you if there are any lead problems in the area.  PREVENTION   Families can help prevent their children from having lead poisoning. Lead reducing steps include:  · If you live in a house or an apartment built before 1978,  paint that is peeling needs to be removed from all surfaces up to 5 feet above the floor.  · Do not store food or drink in ceramic pottery that may have lead glazes.  · Use only cold water from your tap or bottled water for drinking or cooking (hot water has more dissolved lead).  · Mop your floors frequently and wash off your child's hands and face before eating. Wash any toys that they may suck on or put in their mouth.  · Make sure your child is not exposed to peeling paint that may contain lead. Close off rooms that are being remodeled (by using plastic sheeting) to reduce the spread of dust that may contain lead.  Document Released: 06/24/2004 Document Revised: 08/09/2011 Document Reviewed: 12/05/2008  Exit[employer]® Patient Information ©2015 [employer]. This information is not intended to replace advice given to you by your health care provider. Make sure you discuss any questions you have with your health care provider.

## 2014-05-07 NOTE — Progress Notes (Signed)
[facility] [address], [place] [id number] [phone]  Date:  05/07/2014   Name:  Jane CanaryJanet P Hew   DOB:  Jul 21, 1950   MRN:  [id number]  PCP:  Elvina SidleLAUENSTEIN,KURT, MD    Chief Complaint: Labs Only and Medication Management   History of Present Illness:  Jane CanaryJanet P Nocera is a 63 y.o. very pleasant female patient who presents with the following:  Says she  Spent a couple days scraping pain that has been on the house since it was built. Over the next few days she had nausea abdominal cramping pain.  Was weak, unable to eat or drink.  She says she was barely able to go to the bathroom. Now she is back to "normal".   She did not wear a respirator or dusk mask while working. No improvement with over the counter medications or other home remedies.  Denies other complaint or health concern today.   Patient Active Problem List   Diagnosis Date Noted  . Essential hypertension 04/24/2014  . DVT (deep venous thrombosis) 03/10/2012  . ELEVATED BP READING WITHOUT DX HYPERTENSION 11/18/2009  . PSORIASIS 09/09/2009  . HYPERGLYCEMIA 02/10/2009  . ENDOMETRIAL POLYP 10/01/2008  . HYPERLIPIDEMIA 04/17/2008  . DEPRESSION 04/17/2008  . ASTHMA 04/17/2008  . SCOLIOSIS 04/17/2008  . COLONIC POLYPS, HX OF 04/17/2008    Past Medical History  Diagnosis Date  . Psoriasiform dermatitis   . Asthma   . Allergy   . Arthritis   . Depression   . GERD (gastroesophageal reflux disease)   . Hyperlipidemia   . Anxiety   . Clotting disorder     Past Surgical History  Procedure Laterality Date  . Nose surgery    . Tonsillectomy    . Tubal ligation      History  Substance Use Topics  . Smoking status: Current Every Day Smoker -- 1.00 packs/day    Types: Cigarettes  . Smokeless tobacco: Not on file  . Alcohol Use: No    Family History  Problem Relation Age of Onset  . Cancer Mother     colon  . Heart disease Father   . Cancer Maternal Grandfather     lung  . Cancer  Maternal Aunt     breast    Allergies  Allergen Reactions  . Acitretin Swelling and Rash  . Other     Unidentified multiple food allergies   . Oxycodone-Aspirin   . Statins     Medication list has been reviewed and updated.  Current Outpatient Prescriptions on File Prior to Visit  Medication Sig Dispense Refill  . albuterol (PROVENTIL HFA;VENTOLIN HFA) 108 (90 BASE) MCG/ACT inhaler Inhale 1 puff into the lungs every 6 (six) hours as needed for wheezing or shortness of breath. 1 Inhaler 11  . aspirin 325 MG tablet Take 325 mg by mouth daily.    [name]. b complex vitamins tablet Take 1 tablet by mouth daily.    . cholecalciferol (VITAMIN D) 1000 UNITS tablet Take 1,000 Units by mouth daily.    . citalopram (CELEXA) 20 MG tablet Take 1 tablet (20 mg total) by mouth daily. 90 tablet 3  . clonazePAM (KLONOPIN) 1 MG tablet Take 1 tablet (1 mg total) by mouth 2 (two) times daily as needed for anxiety. 60 tablet 1  . clonazePAM (KLONOPIN) 1 MG tablet Take 1 tablet (1 mg total) by mouth 2 (two) times daily. 14 tablet 0  . EPINEPHrine (EPI-PEN) 0.3 mg/0.3 mL SOAJ injection Inject  0.3 mLs (0.3 mg total) into the muscle once. As needed for allergic reaction 2 Device prn  . gabapentin (NEURONTIN) 600 MG tablet Take 600 mg by mouth 3 (three) times daily.    . hydrOXYzine (ATARAX/VISTARIL) 10 MG tablet Take 1 tablet (10 mg total) by mouth at bedtime as needed. 90 tablet 3  . lisinopril-hydrochlorothiazide (ZESTORETIC) 20-12.5 MG per tablet Take 1 tablet by mouth daily. 90 tablet 3  . Magnesium 250 MG TABS Take 250 mg by mouth 2 (two) times daily.    [name]. tiotropium (SPIRIVA HANDIHALER) 18 MCG inhalation capsule Place 1 capsule into inhaler and inhale daily. 30 capsule 11   No current facility-administered medications on file prior to visit.    Review of Systems:  As per HPI, otherwise negative.    Physical Examination: Filed Vitals:   05/07/14 1645  BP: 100/60  Pulse: 73  Temp: 98.3 F (36.8 C)   Resp: 16   Filed Vitals:   05/07/14 1645  Height: 5\' 1"  (1.549 m)  Weight: 151 lb (68.493 kg)   Body mass index is 28.55 kg/(m^2). Ideal Body Weight: Weight in (lb) to have BMI = 25: 132   GEN: WDWN, NAD, Non-toxic, Alert & Oriented x 3 HEENT: Atraumatic, Normocephalic.  Ears and Nose: No external deformity. EXTR: No clubbing/cyanosis/edema NEURO: Normal gait.  PSYCH: Normally interactive. Conversant. Not depressed or anxious appearing.  Calm demeanor.    Assessment and Plan: Lead exposure Labs  Signed,  Phillips OdorJeffery Anderson, MD

## 2014-05-08 LAB — COMPREHENSIVE METABOLIC PANEL
ALT: 13 U/L (ref 0–35)
AST: 17 U/L (ref 0–37)
Albumin: 4 g/dL (ref 3.5–5.2)
Alkaline Phosphatase: 88 U/L (ref 39–117)
BILIRUBIN TOTAL: 0.3 mg/dL (ref 0.2–1.2)
BUN: 17 mg/dL (ref 6–23)
CO2: 30 meq/L (ref 19–32)
Calcium: 9.6 mg/dL (ref 8.4–10.5)
Chloride: 104 mEq/L (ref 96–112)
Creat: 0.96 mg/dL (ref 0.50–1.10)
Glucose, Bld: 80 mg/dL (ref 70–99)
Potassium: 4.5 mEq/L (ref 3.5–5.3)
Sodium: 140 mEq/L (ref 135–145)
Total Protein: 6.9 g/dL (ref 6.0–8.3)

## 2014-05-09 ENCOUNTER — Telehealth: Payer: Self-pay | Admitting: Family Medicine

## 2014-05-09 NOTE — Telephone Encounter (Signed)
Left a message for patient to drop by and get the flu shot or call and update the chart if she has received the flu shot.

## 2014-05-10 LAB — LEAD, BLOOD: LEAD-WHOLE BLOOD: 2 ug/dL (ref <10)

## 2014-05-29 DIAGNOSIS — Z01419 Encounter for gynecological examination (general) (routine) without abnormal findings: Secondary | ICD-10-CM | POA: Diagnosis not present

## 2014-05-29 DIAGNOSIS — Z124 Encounter for screening for malignant neoplasm of cervix: Secondary | ICD-10-CM | POA: Diagnosis not present

## 2014-06-03 ENCOUNTER — Other Ambulatory Visit: Payer: Self-pay | Admitting: Obstetrics and Gynecology

## 2014-06-03 DIAGNOSIS — E2839 Other primary ovarian failure: Secondary | ICD-10-CM

## 2014-06-26 ENCOUNTER — Ambulatory Visit
Admission: RE | Admit: 2014-06-26 | Discharge: 2014-06-26 | Disposition: A | Discharge status: Home / Self Care | Payer: Medicare Other | Source: Ambulatory Visit | Attending: Obstetrics and Gynecology | Admitting: Obstetrics and Gynecology

## 2014-06-26 DIAGNOSIS — M85852 Other specified disorders of bone density and structure, left thigh: Secondary | ICD-10-CM | POA: Diagnosis not present

## 2014-06-26 DIAGNOSIS — E2839 Other primary ovarian failure: Secondary | ICD-10-CM

## 2014-06-26 DIAGNOSIS — Z780 Asymptomatic menopausal state: Secondary | ICD-10-CM | POA: Diagnosis not present

## 2014-07-14 ENCOUNTER — Ambulatory Visit (INDEPENDENT_AMBULATORY_CARE_PROVIDER_SITE_OTHER): Payer: Medicare Other | Admitting: Family Medicine

## 2014-07-14 ENCOUNTER — Ambulatory Visit (INDEPENDENT_AMBULATORY_CARE_PROVIDER_SITE_OTHER): Payer: Medicare Other

## 2014-07-14 VITALS — BP 110/62 | HR 90 | Temp 98.5°F | Resp 18 | Ht 61.0 in | Wt 156.0 lb

## 2014-07-14 DIAGNOSIS — J4541 Moderate persistent asthma with (acute) exacerbation: Secondary | ICD-10-CM | POA: Diagnosis not present

## 2014-07-14 DIAGNOSIS — R059 Cough, unspecified: Secondary | ICD-10-CM

## 2014-07-14 DIAGNOSIS — J069 Acute upper respiratory infection, unspecified: Secondary | ICD-10-CM | POA: Diagnosis not present

## 2014-07-14 DIAGNOSIS — R05 Cough: Secondary | ICD-10-CM

## 2014-07-14 DIAGNOSIS — J988 Other specified respiratory disorders: Secondary | ICD-10-CM | POA: Diagnosis not present

## 2014-07-14 DIAGNOSIS — Z720 Tobacco use: Secondary | ICD-10-CM

## 2014-07-14 DIAGNOSIS — J22 Unspecified acute lower respiratory infection: Secondary | ICD-10-CM

## 2014-07-14 LAB — POCT CBC
Granulocyte percent: 55 %G (ref 37–80)
HEMATOCRIT: 43.8 % (ref 37.7–47.9)
Hemoglobin: 14.1 g/dL (ref 12.2–16.2)
Lymph, poc: 3.2 (ref 0.6–3.4)
MCH, POC: 30.1 pg (ref 27–31.2)
MCHC: 32.1 g/dL (ref 31.8–35.4)
MCV: 93.7 fL (ref 80–97)
MID (cbc): 0.8 (ref 0–0.9)
MPV: 7.2 fL (ref 0–99.8)
POC GRANULOCYTE: 4.9 (ref 2–6.9)
POC LYMPH %: 35.6 % (ref 10–50)
POC MID %: 9.4 %M (ref 0–12)
Platelet Count, POC: 340 10*3/uL (ref 142–424)
RBC: 4.67 M/uL (ref 4.04–5.48)
RDW, POC: 16.2 %
WBC: 8.9 10*3/uL (ref 4.6–10.2)

## 2014-07-14 MED ORDER — IPRATROPIUM BROMIDE 0.02 % IN SOLN
0.5000 mg | Freq: Once | RESPIRATORY_TRACT | Status: AC
  Administered 2014-07-14: 0.5 mg via RESPIRATORY_TRACT

## 2014-07-14 MED ORDER — DM-GUAIFENESIN ER 30-600 MG PO TB12: 1.0000 | ORAL_TABLET | Freq: Two times a day (BID) | ORAL | Status: DC

## 2014-07-14 MED ORDER — METHYLPREDNISOLONE ACETATE 80 MG/ML IJ SUSP
120.0000 mg | Freq: Once | INTRAMUSCULAR | Status: AC
  Administered 2014-07-14: 120 mg via INTRAMUSCULAR

## 2014-07-14 MED ORDER — PREDNISONE 20 MG PO TABS: ORAL_TABLET | ORAL | Status: DC

## 2014-07-14 MED ORDER — DOXYCYCLINE HYCLATE 100 MG PO CAPS: 100.0000 mg | ORAL_CAPSULE | Freq: Two times a day (BID) | ORAL | Status: DC

## 2014-07-14 MED ORDER — ALBUTEROL SULFATE (2.5 MG/3ML) 0.083% IN NEBU
2.5000 mg | INHALATION_SOLUTION | Freq: Once | RESPIRATORY_TRACT | Status: AC
  Administered 2014-07-14: 2.5 mg via RESPIRATORY_TRACT

## 2014-07-14 NOTE — Progress Notes (Signed)
Subjective:    Patient ID: Tasha [name], female    DOB: [birth year], 64 y.o.   MRN: [id number]  07/14/2014  Cough and Shortness of Breath   HPI This 64 y.o. female presents for evaluation of cough, SOB.   Onset one week ago.  Suffering with deep cough; +ribs sore from coughing.  COld as well.  Cold is improved.  Hoarseness.  Rhinorrhea; +congestion clear.  Sputum production white and large amounts. Wheezing some; using Alkeseltzer Cold.  Took Dayquil as well.  Nothing today.  No fever/chills/sweats.  No headache.  No sore throat or ear pain.  Throat is scratchy.  +SOB.  Lozenges as well.  No v/d.  +tobacco abuse.  Recovering alcoholic and drug addict.  Albuterol 2 puffs every hour with acute illness; uses Albuterol qid at baseline.  Compliance with Spiriva daily; not currently taking Symbicort or Advair but has used in the past.  No nebulizer at home.  One ED visit at age 24 with asthma exacerbation.  Baseline pulse oximetry appears to be 95-97%.   Review of Systems  Constitutional: Negative for fever, chills, diaphoresis and fatigue.  HENT: Positive for congestion, rhinorrhea and voice change. Negative for ear pain, sore throat and trouble swallowing.   Respiratory: Positive for cough, shortness of breath and wheezing.   Cardiovascular: Negative for chest pain and leg swelling.  Gastrointestinal: Negative for nausea, vomiting, abdominal pain and diarrhea.  Skin: Negative for rash.  Neurological: Negative for headaches.    Past Medical History  Diagnosis Date  . Psoriasiform dermatitis   . Asthma   . Allergy   . Arthritis   . Depression   . GERD (gastroesophageal reflux disease)   . Hyperlipidemia   . Anxiety   . Clotting disorder    Past Surgical History  Procedure Laterality Date  . Nose surgery    . Tonsillectomy    . Tubal ligation     Allergies  Allergen Reactions  . Acitretin Swelling and Rash  . Other     Unidentified multiple food allergies   .  Oxycodone-Aspirin   . Statins    Current Outpatient Prescriptions  Medication Sig Dispense Refill  . albuterol (PROVENTIL HFA;VENTOLIN HFA) 108 (90 BASE) MCG/ACT inhaler Inhale 1 puff into the lungs every 6 (six) hours as needed for wheezing or shortness of breath. 1 Inhaler 11  . aspirin 325 MG tablet Take 325 mg by mouth daily.    . cholecalciferol (VITAMIN D) 1000 UNITS tablet Take 2,000 Units by mouth daily.     . citalopram (CELEXA) 20 MG tablet Take 1 tablet (20 mg total) by mouth daily. 90 tablet 3  . EPINEPHrine (EPI-PEN) 0.3 mg/0.3 mL SOAJ injection Inject 0.3 mLs (0.3 mg total) into the muscle once. As needed for allergic reaction 2 Device prn  . gabapentin (NEURONTIN) 600 MG tablet Take 600 mg by mouth 3 (three) times daily.    . hydrOXYzine (ATARAX/VISTARIL) 10 MG tablet Take 1 tablet (10 mg total) by mouth at bedtime as needed. 90 tablet 3  . lisinopril-hydrochlorothiazide (ZESTORETIC) 20-12.5 MG per tablet Take 1 tablet by mouth daily. 90 tablet 3  . tiotropium (SPIRIVA HANDIHALER) 18 MCG inhalation capsule Place 1 capsule into inhaler and inhale daily. 30 capsule 11  . b complex vitamins tablet Take 1 tablet by mouth daily.    . clonazePAM (KLONOPIN) 1 MG tablet Take 1 tablet (1 mg total) by mouth 2 (two) times daily as needed for anxiety. (Patient not taking: Reported  on 07/14/2014) 60 tablet 1  . clonazePAM (KLONOPIN) 1 MG tablet Take 1 tablet (1 mg total) by mouth 2 (two) times daily. (Patient not taking: Reported on 07/14/2014) 14 tablet 0  . dextromethorphan-guaiFENesin (MUCINEX DM) 30-600 MG per 12 hr tablet Take 1 tablet by mouth 2 (two) times daily. 20 tablet 0  . doxycycline (VIBRAMYCIN) 100 MG capsule Take 1 capsule (100 mg total) by mouth 2 (two) times daily. 20 capsule 0  . Magnesium 250 MG TABS Take 250 mg by mouth 2 (two) times daily.    . predniSONE (DELTASONE) 20 MG tablet Two tablets daily x 5 days then one tablet daily x 5 days 15 tablet 0   No current  facility-administered medications for this visit.       Objective:    BP 110/62 mmHg  Pulse 90  Temp(Src) 98.5 F (36.9 C) (Oral)  Resp 18  Ht  (1.549 m)  Wt 156 lb (70.761 kg)  BMI 29.49 kg/m2  SpO2 91% Physical Exam  Constitutional: She is oriented to person, place, and time. She appears well-developed and well-nourished. No distress.  HENT:  Head: Normocephalic and atraumatic.  Right Ear: External ear normal.  Left Ear: External ear normal.  Nose: Nose normal.  Mouth/Throat: Oropharynx is clear and moist.  Eyes: Conjunctivae and EOM are normal. Pupils are equal, round, and reactive to light.  Neck: Normal range of motion. Neck supple. Carotid bruit is not present. No thyromegaly present.  Cardiovascular: Normal rate, regular rhythm, normal heart sounds and intact distal pulses.  Exam reveals no gallop and no friction rub.   No murmur heard. Pulmonary/Chest: Effort normal. No accessory muscle usage. No tachypnea. No respiratory distress. She has no decreased breath sounds. She has wheezes in the right upper field, the right middle field, the right lower field, the left upper field, the left middle field and the left lower field. She has no rhonchi. She has no rales.  Decreased air movement.  Speaking regularly but in short choppy sentences.  Abdominal: Soft. Bowel sounds are normal. She exhibits no distension and no mass. There is no tenderness. There is no rebound and no guarding.  Lymphadenopathy:    She has no cervical adenopathy.  Neurological: She is alert and oriented to person, place, and time. No cranial nerve deficit.  Skin: Skin is warm and dry. No rash noted. She is not diaphoretic. No erythema. No pallor.  Psychiatric: She has a normal mood and affect. Her behavior is normal.   Results for orders placed or performed in visit on 07/14/14  POCT CBC  Result Value Ref Range   WBC 8.9 4.6 - 10.2 K/uL   Lymph, poc 3.2 0.6 - 3.4   POC LYMPH PERCENT 35.6 10 - 50 %L    MID (cbc) 0.8 0 - 0.9   POC MID % 9.4 0 - 12 %M   POC Granulocyte 4.9 2 - 6.9   Granulocyte percent 55.0 37 - 80 %G   RBC 4.67 4.04 - 5.48 M/uL   Hemoglobin 14.1 12.2 - 16.2 g/dL   HCT, POC 16.1 09.6 - 47.9 %   MCV 93.7 80 - 97 fL   MCH, POC 30.1 27 - 31.2 pg   MCHC 32.1 31.8 - 35.4 g/dL   RDW, POC 04.5 %   Platelet Count, POC 340 142 - 424 K/uL   MPV 7.2 0 - 99.8 fL   ALBUTEROL/ATROVENT NEBULIZER ADMINISTERED IN OFFICE.  DEPOMEDROL  IM ADMINISTERED IN OFFICE.  [facility] reading (PRIMARY) by  Dr. Katrinka BlazingSmith. CXR:  NAD  POST-NEBULIZER PEAK FLOW: 325, 325, 325 (PREDICTED 385).  Pulse oximetry 96%.  Increased air movement; decrease in wheezing; speaking in normal sentences.    Assessment & Plan:   1. Cough   2. Asthma with acute exacerbation, moderate persistent   3. Tobacco abuse   4. Acute upper respiratory infection   5. Lower respiratory infection      1. Asthma/COPD exacerbation: New.  S/p Albuterol/Atrovent nebulizer and Depomedrol 120mg  IM in office; rx for Prednisone provided; continue Albuterol every four hours and Spiriva daily.  RTC for acute worsening.  Recommend Symbicort but pt is financially restricted due to donut hole. 2.  URI/lower respiratory infection: New.  Rx for Doxycycline and Mucinex DM provided. 3.  Tobacco abuse: persistent; encourage cessation.    Meds ordered this encounter  Medications  . albuterol (PROVENTIL) (2.5 MG/3ML) 0.083% nebulizer solution 2.5 mg    Sig:   . ipratropium (ATROVENT) nebulizer solution 0.5 mg    Sig:   . methylPREDNISolone acetate (DEPO-MEDROL) injection 120 mg    Sig:   . doxycycline (VIBRAMYCIN) 100 MG capsule    Sig: Take 1 capsule (100 mg total) by mouth 2 (two) times daily.    Dispense:  20 capsule    Refill:  0  . predniSONE (DELTASONE) 20 MG tablet    Sig: Two tablets daily x 5 days then one tablet daily x 5 days    Dispense:  15 tablet    Refill:  0  . dextromethorphan-guaiFENesin (MUCINEX DM) 30-600 MG per  12 hr tablet    Sig: Take 1 tablet by mouth 2 (two) times daily.    Dispense:  20 tablet    Refill:  0    No Follow-up on file.    Alric Geise Paulita FujitaMartin Louisa Favaro, M.D. [facility]  [place] [address] [place], [place]  [id number] [phone] phone [phone] fax

## 2014-08-18 ENCOUNTER — Ambulatory Visit (INDEPENDENT_AMBULATORY_CARE_PROVIDER_SITE_OTHER): Payer: Medicare Other | Admitting: Family Medicine

## 2014-08-18 VITALS — BP 120/70 | HR 79 | Temp 98.1°F | Resp 16 | Ht 61.0 in | Wt 157.4 lb

## 2014-08-18 DIAGNOSIS — F4322 Adjustment disorder with anxiety: Secondary | ICD-10-CM

## 2014-08-18 DIAGNOSIS — Z91018 Allergy to other foods: Secondary | ICD-10-CM | POA: Diagnosis not present

## 2014-08-18 DIAGNOSIS — F411 Generalized anxiety disorder: Secondary | ICD-10-CM

## 2014-08-18 MED ORDER — CLONAZEPAM 1 MG PO TABS: 1.0000 mg | ORAL_TABLET | Freq: Two times a day (BID) | ORAL | Status: DC | PRN

## 2014-08-18 MED ORDER — EPINEPHRINE 0.3 MG/0.3ML IJ SOAJ: 0.3000 mg | Freq: Once | INTRAMUSCULAR | Status: DC

## 2014-08-18 NOTE — Progress Notes (Signed)
Chief Complaint:  Chief Complaint  Patient presents with  . Anxiety    HPI: Tasha [name] is a 64 y.o. female who is here for refill of her anxiety medication. She had a panic attack recently and had to pull over the road. She apparently was given clonazepam at some point and decided that she didn't want to have any potential of being addicted to them or have a dependency on them so she gave them to her psychiatrist Dr Emerson Monte to dispose of. The patient has a history of alcohol abuse/dependency, has been sober for almost 29 years. She states that she does not ever want to go back to feeling that way again about any medications. However she does have significant anxiety. She is on Celexa at this point. He sees Dr. Macario Carls. She is not using the clonazepam very often to begin with. She still had some leftover pills from a recent ER visit due to  Withdrawal symptoms from weaning off the Celexa too quickly. She took these pills but is anxious that she may need them in the future. She does not take them regular. The 14 pills that she had from the ER was given to her in July 2015. He typically breaks them in half.  Past Medical History  Diagnosis Date  . Psoriasiform dermatitis   . Asthma   . Allergy   . Arthritis   . Depression   . GERD (gastroesophageal reflux disease)   . Hyperlipidemia   . Anxiety   . Clotting disorder    Past Surgical History  Procedure Laterality Date  . Nose surgery    . Tonsillectomy    . Tubal ligation     History   Social History  . Marital Status: Single    Spouse Name: N/A  . Number of Children: N/A  . Years of Education: N/A   Social History Main Topics  . Smoking status: Current Every Day Smoker -- 1.00 packs/day    Types: Cigarettes  . Smokeless tobacco: Not on file  . Alcohol Use: No  . Drug Use: No  . Sexual Activity: Not on file   Other Topics Concern  . None   Social History Narrative   Family History  Problem  Relation Age of Onset  . Cancer Mother     colon  . Heart disease Father   . Cancer Maternal Grandfather     lung  . Cancer Maternal Aunt     breast   Allergies  Allergen Reactions  . Acitretin Swelling and Rash  . Other     Unidentified multiple food allergies   . Oxycodone-Aspirin   . Statins    Prior to Admission medications   Medication Sig Start Date End Date Taking? Authorizing Provider  albuterol (PROVENTIL HFA;VENTOLIN HFA) 108 (90 BASE) MCG/ACT inhaler Inhale 1 puff into the lungs every 6 (six) hours as needed for wheezing or shortness of breath. 04/24/14  Yes [name], MD  aspirin 325 MG tablet Take 325 mg by mouth daily.   Yes Historical Provider, MD  cholecalciferol (VITAMIN D) 1000 UNITS tablet Take 2,000 Units by mouth daily.    Yes Historical Provider, MD  citalopram (CELEXA) 20 MG tablet Take 1 tablet (20 mg total) by mouth daily. 12/14/13  Yes [name], MD  clonazePAM (KLONOPIN) 1 MG tablet Take 1 tablet (1 mg total) by mouth 2 (two) times daily as needed for anxiety. 08/18/14  Yes Eastyn Skalla P Jacques Fife, DO  EPINEPHrine 0.3 mg/0.3 mL IJ SOAJ injection Inject 0.3 mLs (0.3 mg total) into the muscle once. As needed for allergic reaction 08/18/14  Yes Weston Kallman P Jacquelinne Speak, DO  gabapentin (NEURONTIN) 600 MG tablet Take 600 mg by mouth 3 (three) times daily.   Yes Historical Provider, MD  hydrOXYzine (ATARAX/VISTARIL) 10 MG tablet Take 1 tablet (10 mg total) by mouth at bedtime as needed. 12/14/13  Yes [name], MD  lisinopril-hydrochlorothiazide (ZESTORETIC) 20-12.5 MG per tablet Take 1 tablet by mouth daily. 04/24/14  Yes [name], MD  predniSONE (DELTASONE) 20 MG tablet Two tablets daily x 5 days then one tablet daily x 5 days 07/14/14  Yes Ethelda Chick, MD  tiotropium (SPIRIVA HANDIHALER) 18 MCG inhalation capsule Place 1 capsule into inhaler and inhale daily. 10/25/13  Yes [name], MD  b complex vitamins tablet Take 1 tablet by mouth daily.    Historical  Provider, MD  dextromethorphan-guaiFENesin (MUCINEX DM) 30-600 MG per 12 hr tablet Take 1 tablet by mouth 2 (two) times daily. Patient not taking: Reported on 08/18/2014 07/14/14   Ethelda Chick, MD  doxycycline (VIBRAMYCIN) 100 MG capsule Take 1 capsule (100 mg total) by mouth 2 (two) times daily. Patient not taking: Reported on 08/18/2014 07/14/14   Ethelda Chick, MD  Magnesium 250 MG TABS Take 250 mg by mouth 2 (two) times daily.    Historical Provider, MD     ROS: The patient denies fevers, chills, night sweats, unintentional weight loss, chest pain, palpitations, nausea, vomiting, abdominal pain, dysuria, hematuria, melena, numbness, weakness, or tingling.   All other systems have been reviewed and were otherwise negative with the exception of those mentioned in the HPI and as above.    PHYSICAL EXAM: Filed Vitals:   08/18/14 1503  BP: 120/70  Pulse: 79  Temp: 98.1 F (36.7 C)  Resp: 16   Filed Vitals:   08/18/14 1503  Height:  (1.549 m)  Weight: 157 lb 6.4 oz (71.396 kg)   Body mass index is 29.76 kg/(m^2).  General: Alert, no acute distress HEENT:  Normocephalic, atraumatic, oropharynx patent. EOMI, PERRLA Cardiovascular:  Regular rate and rhythm, no rubs murmurs or gallops.  No Carotid bruits, radial pulse intact. No pedal edema.  Respiratory: + Chronic bilateral wheezes, no rales, or rhonchi.  No cyanosis, no use of accessory musculature GI: No organomegaly, abdomen is soft and non-tender, positive bowel sounds.  No masses. Skin: Positive psoriatic eczematous rashes diffusely present on chest. Neurologic: Facial musculature symmetric. Psychiatric: Patient is appropriate throughout our interaction. Lymphatic: No cervical lymphadenopathy Musculoskeletal: Gait intact.   LABS: Results for orders placed or performed in visit on 07/14/14  POCT CBC  Result Value Ref Range   WBC 8.9 4.6 - 10.2 K/uL   Lymph, poc 3.2 0.6 - 3.4   POC LYMPH PERCENT 35.6 10 - 50 %L    MID (cbc) 0.8 0 - 0.9   POC MID % 9.4 0 - 12 %M   POC Granulocyte 4.9 2 - 6.9   Granulocyte percent 55.0 37 - 80 %G   RBC 4.67 4.04 - 5.48 M/uL   Hemoglobin 14.1 12.2 - 16.2 g/dL   HCT, POC 13.0 86.5 - 47.9 %   MCV 93.7 80 - 97 fL   MCH, POC 30.1 27 - 31.2 pg   MCHC 32.1 31.8 - 35.4 g/dL   RDW, POC 78.4 %   Platelet Count, POC 340 142 - 424 K/uL   MPV 7.2 0 -  99.8 fL     EKG/XRAY:   Primary read [facility]   ASSESSMENT/PLAN: Encounter Diagnoses  Name Primary?  . Adjustment disorder with anxious mood Yes  . Generalized anxiety disorder   . Food allergy    Mrs. Katherine Roan Graham is a pleasant 64 year old female with a history of depression/anxiety, prior substance abuse with alcohol. She has been sober for 29 years.  She is here for refill of her clonazepam. She does not take it very often. She is very on Celexa and sees Dr. Emerson MonteParrish McKinney. Over her last clonazepam refill was given by Dr. Velta Addisonurt Loewenstein. She has been having panic attacks. She would like to have a small refill just in case she needs it. Refill clonazepam She has a history of anaphylaxis with food, unknown source, has old epinephrine pens. She would like a refill of her EpiPen. Advised patient to take her Spiriva today. She has baseline minimal wheezing. Follow-up as needed  Gross sideeffects, risk and benefits, and alternatives of medications d/w patient. Patient is aware that all medications have potential sideeffects and we are unable to predict every sideeffect or drug-drug interaction that may occur.  Hamilton CapriLE, Neira Bentsen PHUONG, DO 08/18/2014 4:13 PM

## 2014-10-23 ENCOUNTER — Telehealth: Payer: Self-pay

## 2014-10-23 ENCOUNTER — Ambulatory Visit (INDEPENDENT_AMBULATORY_CARE_PROVIDER_SITE_OTHER): Payer: Medicare Other | Admitting: Sports Medicine

## 2014-10-23 VITALS — BP 118/70 | HR 82 | Temp 99.2°F | Resp 16 | Ht 61.0 in | Wt 153.2 lb

## 2014-10-23 DIAGNOSIS — L408 Other psoriasis: Secondary | ICD-10-CM

## 2014-10-23 DIAGNOSIS — S81831A Puncture wound without foreign body, right lower leg, initial encounter: Secondary | ICD-10-CM | POA: Diagnosis not present

## 2014-10-23 DIAGNOSIS — Z23 Encounter for immunization: Secondary | ICD-10-CM | POA: Diagnosis not present

## 2014-10-23 DIAGNOSIS — L308 Other specified dermatitis: Secondary | ICD-10-CM

## 2014-10-23 NOTE — Telephone Encounter (Signed)
Pt was asked to call back from her visit with us today and give the name of the vitamin c which is liposomal liquid

## 2014-10-23 NOTE — Telephone Encounter (Signed)
Spoke with patient about vitamin C

## 2014-10-23 NOTE — Progress Notes (Signed)
  Tasha CanaryJanet P [name] - 64 y.o. female MRN [id number]  Date of birth: [birth year]  SUBJECTIVE:  Including CC & ROS.  patient C/O: Puncture wound from a rusty nail Onset of symptoms: Tuesday Symptoms: Patient reports when sitting down a neighbor's house on a piece along furniture she was punctured in the posterior aspect of her left leg by a rusty nail. She had immediate pain but no swelling or erythema developed. 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 She asked for clinical device on the use of vitamin C as a possible treatment for autoimmune conditions. She would like to start this treatment but is unaware of the research behind vitamin C for her condition.   ROS:  Constitutional:  No fever, chills, or fatigue.  Respiratory:  No shortness of breath, cough, or wheezing Cardiovascular:  No palpitations, chest pain or syncope Gastrointestinal:  No nausea, no abdominal pain Review of systems otherwise negative except for what is stated in HPI  HISTORY: Past Medical, Surgical, Social, and Family History Reviewed & Updated per EMR. Pertinent Historical Findings include: Hypertension, asthma, psoriasis form dermatitis, hyperlipidemia, DVT  PHYSICAL EXAM:  VS: BP:118/70 mmHg  HR:82bpm  TEMP:99.2 F (37.3 C)(Oral)  RESP:95 %  HT:5\' 1"  (154.9 cm)   WT:153 lb 3.2 oz (69.491 kg)  BMI:29 PHYSICAL EXAM: General:  Alert and oriented, No acute distress.   HENT:  Normocephalic, Oral mucosa is moist.   Respiratory:  Lungs are clear to auscultation, Respirations are non-labored, Symmetrical chest wall expansion.   Cardiovascular:  Normal rate, Regular rhythm, No murmur, Good pulses equal in all extremities, No edema.   Gastrointestinal:  Soft, Non-tender, Non-distended, Normal bowel sounds, No organomegaly.     Integumentary:  Warm, Dry, No rash.  Patient has a large psoriatic plaque dermatitis on her right foot that has cracks and dryness. No signs of secondary infection erythema or swelling. Posterior aspect of her left leg has a small puncture wound present with no surrounding erythema swelling or pain to palpation. Neurologic:  Alert, Oriented, No focal defects Psychiatric:  Cooperative, Appropriate mood & affect.    ASSESSMENT & PLAN:  Impression: Puncture wound secondary to nail Requires tetanus vaccine Psoriatic form dermatitis  Recommendations: We'll treat patient with tetanus vaccine today no need for further wound care at this time. Gave patient precautions possible progression of her puncture wound. Advised patient to call back with further information about vitamin C supplementation so that we may be able to do further research on the effectiveness of this for her autoimmune condition. Discuss with patient that likely the evidence is low on the effectiveness of vitamin C for any treatment of autoimmune conditions.

## 2014-10-23 NOTE — Telephone Encounter (Signed)
Patient came in tonight to be seen for a tetanus shot and had a question for you. She wanted to know if taking huge doses of vitamin C will cause an autoimmune disorder to either go away or get worse.

## 2014-10-23 NOTE — Telephone Encounter (Signed)
fyi dr Tammy Soursdidiano

## 2014-10-24 ENCOUNTER — Encounter: Payer: Self-pay | Admitting: Sports Medicine

## 2014-10-24 NOTE — Telephone Encounter (Signed)
Letter sent.

## 2014-10-24 NOTE — Telephone Encounter (Signed)
Please mail the letter created to answer patient question which can be found in her chart.

## 2015-04-05 ENCOUNTER — Encounter (HOSPITAL_COMMUNITY): Payer: Self-pay | Admitting: Emergency Medicine

## 2015-04-05 ENCOUNTER — Emergency Department (INDEPENDENT_AMBULATORY_CARE_PROVIDER_SITE_OTHER)
Admission: EM | Admit: 2015-04-05 | Discharge: 2015-04-05 | Disposition: A | Discharge status: Home / Self Care | Payer: Medicare Other | Source: Home / Self Care | Attending: Internal Medicine | Admitting: Internal Medicine

## 2015-04-05 DIAGNOSIS — J4521 Mild intermittent asthma with (acute) exacerbation: Secondary | ICD-10-CM | POA: Diagnosis not present

## 2015-04-05 MED ORDER — ALBUTEROL SULFATE (2.5 MG/3ML) 0.083% IN NEBU
2.5000 mg | INHALATION_SOLUTION | Freq: Once | RESPIRATORY_TRACT | Status: AC
  Administered 2015-04-05: 2.5 mg via RESPIRATORY_TRACT

## 2015-04-05 MED ORDER — PREDNISONE 20 MG PO TABS: ORAL_TABLET | ORAL | Status: DC

## 2015-04-05 MED ORDER — ALBUTEROL SULFATE (2.5 MG/3ML) 0.083% IN NEBU
INHALATION_SOLUTION | RESPIRATORY_TRACT | Status: AC
  Filled 2015-04-05: qty 3

## 2015-04-05 MED ORDER — IPRATROPIUM-ALBUTEROL 0.5-2.5 (3) MG/3ML IN SOLN
3.0000 mL | Freq: Once | RESPIRATORY_TRACT | Status: AC
  Administered 2015-04-05: 3 mL via RESPIRATORY_TRACT

## 2015-04-05 MED ORDER — IPRATROPIUM-ALBUTEROL 0.5-2.5 (3) MG/3ML IN SOLN
RESPIRATORY_TRACT | Status: AC
  Filled 2015-04-05: qty 3

## 2015-04-05 NOTE — ED Notes (Signed)
C/o asthma flare up onset 30 minutes ago; reports she was blowing leaves Has had albuterol rescue inhaler x30 minutes ago and her klonopin  Pt had x1 emesis episode in the exam room A&O x4

## 2015-04-05 NOTE — Discharge Instructions (Signed)
Asthma, Acute Bronchospasm Use your albuterol HFA as directed. Low-dose prednisone taper. Take with food When outside in the next few days may want to consider taking in any histamines such as Allegra or Claritin. Acute bronchospasm caused by asthma is also referred to as an asthma attack. Bronchospasm means your air passages become narrowed. The narrowing is caused by inflammation and tightening of the muscles in the air tubes (bronchi) in your lungs. This can make it hard to breathe or cause you to wheeze and cough. CAUSES Possible triggers are:  Animal dander from the skin, hair, or feathers of animals.  Dust mites contained in house dust.  Cockroaches.  Pollen from trees or grass.  Mold.  Cigarette or tobacco smoke.  Air pollutants such as dust, household cleaners, hair sprays, aerosol sprays, paint fumes, strong chemicals, or strong odors.  Cold air or weather changes. Cold air may trigger inflammation. Winds increase molds and pollens in the air.  Strong emotions such as crying or laughing hard.  Stress.  Certain medicines such as aspirin or beta-blockers.  Sulfites in foods and drinks, such as dried fruits and wine.  Infections or inflammatory conditions, such as a flu, cold, or inflammation of the nasal membranes (rhinitis).  Gastroesophageal reflux disease (GERD). GERD is a condition where stomach acid backs up into your esophagus.  Exercise or strenuous activity. SIGNS AND SYMPTOMS   Wheezing.  Excessive coughing, particularly at night.  Chest tightness.  Shortness of breath. DIAGNOSIS  Your health care provider will ask you about your medical history and perform a physical exam. A chest X-ray or blood testing may be performed to look for other causes of your symptoms or other conditions that may have triggered your asthma attack. TREATMENT  Treatment is aimed at reducing inflammation and opening up the airways in your lungs. Most asthma attacks are  treated with inhaled medicines. These include quick relief or rescue medicines (such as bronchodilators) and controller medicines (such as inhaled corticosteroids). These medicines are sometimes given through an inhaler or a nebulizer. Systemic steroid medicine taken by mouth or given through an IV tube also can be used to reduce the inflammation when an attack is moderate or severe. Antibiotic medicines are only used if a bacterial infection is present.  HOME CARE INSTRUCTIONS   Rest.  Drink plenty of liquids. This helps the mucus to remain thin and be easily coughed up. Only use caffeine in moderation and do not use alcohol until you have recovered from your illness.  Do not smoke. Avoid being exposed to secondhand smoke.  You play a critical role in keeping yourself in good health. Avoid exposure to things that cause you to wheeze or to have breathing problems.  Keep your medicines up-to-date and available. Carefully follow your health care provider's treatment plan.  Take your medicine exactly as prescribed.  When pollen or pollution is bad, keep windows closed and use an air conditioner or go to places with air conditioning.  Asthma requires careful medical care. See your health care provider for a follow-up as advised. If you are more than [redacted] weeks pregnant and you were prescribed any new medicines, let your obstetrician know about the visit and how you are doing. Follow up with your health care provider as directed.  After you have recovered from your asthma attack, make an appointment with your outpatient doctor to talk about ways to reduce the likelihood of future attacks. If you do not have a doctor who manages your asthma, make  an appointment with a primary care doctor to discuss your asthma. SEEK IMMEDIATE MEDICAL CARE IF:   You are getting worse.  You have trouble breathing. If severe, call your local emergency services (911 in the [place]  You develop chest pain or  discomfort.  You are vomiting.  You are not able to keep fluids down.  You are coughing up yellow, green, brown, or bloody sputum.  You have a fever and your symptoms suddenly get worse.  You have trouble swallowing. MAKE SURE YOU:   Understand these instructions.  Will watch your condition.  Will get help right away if you are not doing well or get worse.   This information is not intended to replace advice given to you by your health care provider. Make sure you discuss any questions you have with your health care provider.   Document Released: 09/01/2006 Document Revised: 05/22/2013 Document Reviewed: 11/22/2012 Elsevier Interactive Patient Education [employer]

## 2015-04-05 NOTE — ED Provider Notes (Signed)
CSN: [id number]     Arrival date & time 04/05/15  1755 History   First MD Initiated Contact with Patient 04/05/15 1823     Chief Complaint  Patient presents with  . Asthma   (Consider location/radiation/quality/duration/timing/severity/associated sxs/prior Treatment) HPI Comments: As per nurse's note the patient was blowing leaves from the gutter and experienced an acute asthma attack. She has history of asthma. She used her albuterol inhaler with insufficient relief. She comes to the urgent care with rapid respirations, she is fully alert and talking in complete sentences. On arrival she had an episode of vomiting. She states just prior to leaving for the urgent care she had consumed type and drink without high volume of protein in it because she had not had anything else to eat today.  Patient is a 64 y.o. female presenting with asthma.  Asthma Associated symptoms include shortness of breath.    Past Medical History  Diagnosis Date  . Psoriasiform dermatitis   . Asthma   . Allergy   . Arthritis   . Depression   . GERD (gastroesophageal reflux disease)   . Hyperlipidemia   . Anxiety   . Clotting disorder [facility](HCC)    Past Surgical History  Procedure Laterality Date  . Nose surgery    . Tonsillectomy    . Tubal ligation     Family History  Problem Relation Age of Onset  . Cancer Mother     colon  . Heart disease Father   . Cancer Maternal Grandfather     lung  . Cancer Maternal Aunt     breast   Social History  Substance Use Topics  . Smoking status: Current Every Day Smoker -- 1.00 packs/day    Types: Cigarettes  . Smokeless tobacco: None  . Alcohol Use: No   OB History    No data available     Review of Systems  Constitutional: Negative for fever, activity change and fatigue.  HENT: Negative.   Eyes: Negative.   Respiratory: Positive for cough, shortness of breath and wheezing.   Cardiovascular: Negative.   Neurological: Negative.     Allergies   Acitretin; Other; Oxycodone-aspirin; and Statins  Home Medications   Prior to Admission medications   Medication Sig Start Date End Date Taking? Authorizing Provider  albuterol (PROVENTIL HFA;VENTOLIN HFA) 108 (90 BASE) MCG/ACT inhaler Inhale 1 puff into the lungs every 6 (six) hours as needed for wheezing or shortness of breath. 04/24/14  Yes Elvina SidleKurt Lauenstein, MD  aspirin 325 MG tablet Take 325 mg by mouth daily.   Yes Historical Provider, MD  b complex vitamins tablet Take 1 tablet by mouth daily.   Yes Historical Provider, MD  cholecalciferol (VITAMIN D) 1000 UNITS tablet Take 2,000 Units by mouth daily.    Yes Historical Provider, MD  clonazePAM (KLONOPIN) 1 MG tablet Take 1 tablet (1 mg total) by mouth 2 (two) times daily as needed for anxiety. 08/18/14  Yes Thao P Le, DO  gabapentin (NEURONTIN) 600 MG tablet Take 600 mg by mouth 3 (three) times daily.   Yes Historical Provider, MD  lisinopril-hydrochlorothiazide (ZESTORETIC) 20-12.5 MG per tablet Take 1 tablet by mouth daily. 04/24/14  Yes Elvina SidleKurt Lauenstein, MD  Magnesium 250 MG TABS Take 250 mg by mouth 2 (two) times daily.   Yes Historical Provider, MD  citalopram (CELEXA) 20 MG tablet Take 1 tablet (20 mg total) by mouth daily. Patient not taking: Reported on 10/23/2014 12/14/13   Elvina SidleKurt Lauenstein, MD  EPINEPHrine 0.3  mg/0.3 mL IJ SOAJ injection Inject 0.3 mLs (0.3 mg total) into the muscle once. As needed for allergic reaction 08/18/14   Thao P Le, DO  Escitalopram Oxalate (LEXAPRO PO) Take by mouth.    Historical Provider, MD  hydrOXYzine (ATARAX/VISTARIL) 10 MG tablet Take 1 tablet (10 mg total) by mouth at bedtime as needed. 12/14/13   [name], MD  predniSONE (DELTASONE) 20 MG tablet Take 2 tabs on the first 3 days then 1 tab on the fourth and fifth day.. Take with food. 04/05/15   Hayden Rasmussen, NP  tiotropium (SPIRIVA HANDIHALER) 18 MCG inhalation capsule Place 1 capsule into inhaler and inhale daily. 10/25/13   [name], MD    Meds Ordered and Administered this Visit   Medications  ipratropium-albuterol (DUONEB) 0.5-2.5 (3) MG/3ML nebulizer solution 3 mL (3 mLs Nebulization Given 04/05/15 1825)  albuterol (PROVENTIL) (2.5 MG/3ML) 0.083% nebulizer solution 2.5 mg (2.5 mg Nebulization Given 04/05/15 1826)    BP 101/70 mmHg  Pulse 89  Resp 40  SpO2 99% No data found.   Physical Exam  Constitutional: She is oriented to person, place, and time. She appears well-developed and well-nourished. No distress.  HENT:  Mouth/Throat: Oropharynx is clear and moist. No oropharyngeal exudate.  Eyes:  Minor erythema and bilateral conjunctivae  Neck: Normal range of motion. Neck supple.  Cardiovascular: Normal rate, regular rhythm and normal heart sounds.   Pulmonary/Chest: She has wheezes. She has no rales.  Patient presented hyperventilating. Positive for expiratory wheezing with minimal prolongation of expiratory phase.  Musculoskeletal: She exhibits no edema.  Lymphadenopathy:    She has no cervical adenopathy.  Neurological: She is alert and oriented to person, place, and time. No cranial nerve deficit. She exhibits normal muscle tone.  Skin: No rash noted.  On arrival the patient had some forehead diaphoresis and watering of the eyes. After the breathing treatment she was completely warm and dry.  Nursing note and vitals reviewed.   ED Course  Procedures (including critical care time)  Labs Review Labs Reviewed - No data to display  Imaging Review No results found.   Visual Acuity Review  Right Eye Distance:   Left Eye Distance:   Bilateral Distance:    Right Eye Near:   Left Eye Near:    Bilateral Near:         MDM   1. Asthma exacerbation attacks, mild intermittent     Patient received a DuoNeb treatment 5/2.5 mg. After the treatment she states she felt better and was breathing better. She was complaining of some tingling sensations in the fingers and toes. This is likely secondary  to her hyperventilation. She does have anxiety and the breathing treatment also increase some trembling. She is smiling, laughing and complementary stating that she is grateful that she had the breathing treatment and is breathing much better. Lungs are clear on discharge. She will continue with her albuterol HFA Will give her low-dose of prednisone for the next 3-4 days to assist with her allergic response and acute inflammation. Allegra or Claritin when working outside.   Hayden Rasmussen, NP 04/05/15 1904

## 2015-04-14 ENCOUNTER — Encounter: Payer: Self-pay | Admitting: Family Medicine

## 2015-04-14 ENCOUNTER — Ambulatory Visit (INDEPENDENT_AMBULATORY_CARE_PROVIDER_SITE_OTHER): Payer: Medicare Other

## 2015-04-14 ENCOUNTER — Ambulatory Visit: Payer: Medicare Other

## 2015-04-14 ENCOUNTER — Ambulatory Visit (INDEPENDENT_AMBULATORY_CARE_PROVIDER_SITE_OTHER): Payer: Medicare Other | Admitting: Family Medicine

## 2015-04-14 VITALS — BP 136/80 | HR 71 | Temp 99.0°F | Resp 16 | Ht 61.0 in | Wt 151.2 lb

## 2015-04-14 DIAGNOSIS — J4520 Mild intermittent asthma, uncomplicated: Secondary | ICD-10-CM | POA: Diagnosis not present

## 2015-04-14 DIAGNOSIS — E7800 Pure hypercholesterolemia, unspecified: Secondary | ICD-10-CM | POA: Diagnosis not present

## 2015-04-14 DIAGNOSIS — J45901 Unspecified asthma with (acute) exacerbation: Secondary | ICD-10-CM | POA: Diagnosis not present

## 2015-04-14 DIAGNOSIS — Z23 Encounter for immunization: Secondary | ICD-10-CM | POA: Diagnosis not present

## 2015-04-14 DIAGNOSIS — J4541 Moderate persistent asthma with (acute) exacerbation: Secondary | ICD-10-CM

## 2015-04-14 DIAGNOSIS — I10 Essential (primary) hypertension: Secondary | ICD-10-CM | POA: Diagnosis not present

## 2015-04-14 DIAGNOSIS — R7309 Other abnormal glucose: Secondary | ICD-10-CM

## 2015-04-14 LAB — COMPREHENSIVE METABOLIC PANEL
ALK PHOS: 64 U/L (ref 33–130)
ALT: 15 U/L (ref 6–29)
AST: 16 U/L (ref 10–35)
Albumin: 3.9 g/dL (ref 3.6–5.1)
BUN: 21 mg/dL (ref 7–25)
CALCIUM: 9.7 mg/dL (ref 8.6–10.4)
CO2: 29 mmol/L (ref 20–31)
Chloride: 106 mmol/L (ref 98–110)
Creat: 0.87 mg/dL (ref 0.50–0.99)
Glucose, Bld: 83 mg/dL (ref 65–99)
Potassium: 4.5 mmol/L (ref 3.5–5.3)
Sodium: 142 mmol/L (ref 135–146)
TOTAL PROTEIN: 6.4 g/dL (ref 6.1–8.1)
Total Bilirubin: 0.4 mg/dL (ref 0.2–1.2)

## 2015-04-14 LAB — LIPID PANEL
CHOL/HDL RATIO: 3 ratio (ref <=5.0)
CHOLESTEROL: 222 mg/dL — AB (ref 125–200)
HDL: 75 mg/dL (ref >=46)
LDL Cholesterol: 125 mg/dL (ref <130)
Triglycerides: 111 mg/dL (ref <150)
VLDL: 22 mg/dL (ref <30)

## 2015-04-14 LAB — HEMOGLOBIN A1C
Hgb A1c MFr Bld: 6 % — ABNORMAL HIGH (ref <5.7)
Mean Plasma Glucose: 126 mg/dL — ABNORMAL HIGH (ref <117)

## 2015-04-14 MED ORDER — IPRATROPIUM BROMIDE 0.02 % IN SOLN
0.5000 mg | Freq: Once | RESPIRATORY_TRACT | Status: AC
  Administered 2015-04-14: 0.5 mg via RESPIRATORY_TRACT

## 2015-04-14 MED ORDER — ALBUTEROL SULFATE (2.5 MG/3ML) 0.083% IN NEBU
5.0000 mg | INHALATION_SOLUTION | Freq: Once | RESPIRATORY_TRACT | Status: AC
  Administered 2015-04-14: 5 mg via RESPIRATORY_TRACT

## 2015-04-14 MED ORDER — METHYLPREDNISOLONE SODIUM SUCC 125 MG IJ SOLR
125.0000 mg | Freq: Once | INTRAMUSCULAR | Status: AC
  Administered 2015-04-14: 125 mg via INTRAMUSCULAR

## 2015-04-14 MED ORDER — ALBUTEROL SULFATE HFA 108 (90 BASE) MCG/ACT IN AERS: 1.0000 | INHALATION_SPRAY | Freq: Four times a day (QID) | RESPIRATORY_TRACT | Status: DC | PRN

## 2015-04-14 MED ORDER — PREDNISONE 20 MG PO TABS: ORAL_TABLET | ORAL | Status: DC

## 2015-04-14 MED ORDER — LISINOPRIL-HYDROCHLOROTHIAZIDE 20-12.5 MG PO TABS: 1.0000 | ORAL_TABLET | Freq: Every day | ORAL | Status: DC

## 2015-04-14 MED ORDER — TIOTROPIUM BROMIDE MONOHYDRATE 18 MCG IN CAPS: ORAL_CAPSULE | RESPIRATORY_TRACT | Status: DC

## 2015-04-14 NOTE — Progress Notes (Signed)
Subjective:    Patient ID: Tasha [name], female    DOB: 1951/03/01, 64 y.o.   MRN: [id number]  04/14/2015  Asthma and Medication Refill   HPI This 64 y.o. female presents for evaluation of asthma exacerbation. Evaluated on 04/05/2015 by Dr. Dayton Scrape at [facility] with acute asthma attack after blowing leaves; s/p nebulizer during visit and prednisone prescribed.  Must have been allergic to the leaf dust.   Since Urgent Care visit, still having DOE.  Can walk around the house with DOE.  Prednisone 40, 40, 40, 20, 20, 20.  Patient did not do this; prescribed  for five days.  No Prednisone today.  Albuterol HFA down to two puffs; has not used Spiriva.  Would have used more today but out.  No nebulizer at home.  Sleeps four hours and then gets up and then sleeps another hour.  Diagnosed with asthma; tobacco abuse for years; smoking for years and forever.  No Advair or Symbicort; Spiriva makes a huge difference for you; can only take Spiriva qod due to donut hole.  Uses Albuterol six times per day.    Hospitalized in [facility] with asthma exacerbation.   HTN: Patient reports good compliance with medication, good tolerance to medication, and good symptom control.  Due for follow-up.   Anxiety and depression: followed by [facility].    Review of Systems  Constitutional: Negative for fever, chills, diaphoresis and fatigue.  HENT: Negative for congestion, ear pain, postnasal drip, rhinorrhea, sinus pressure, sneezing, sore throat, trouble swallowing and voice change.   Eyes: Negative for visual disturbance.  Respiratory: Positive for cough, shortness of breath and wheezing. Negative for stridor.   Cardiovascular: Negative for chest pain, palpitations and leg swelling.  Gastrointestinal: Negative for nausea, vomiting, abdominal pain, diarrhea and constipation.  Endocrine: Negative for cold intolerance, heat intolerance, polydipsia, polyphagia and polyuria.  Neurological:  Negative for dizziness, tremors, seizures, syncope, facial asymmetry, speech difficulty, weakness, light-headedness, numbness and headaches.    Past Medical History  Diagnosis Date  . Psoriasiform dermatitis   . Asthma   . Allergy   . Arthritis   . Depression   . GERD (gastroesophageal reflux disease)   . Hyperlipidemia   . Anxiety   . Clotting disorder [facility])    Past Surgical History  Procedure Laterality Date  . Nose surgery    . Tonsillectomy    . Tubal ligation     Allergies  Allergen Reactions  . Acitretin Swelling and Rash  . Other     Unidentified multiple food allergies   . Oxycodone-Aspirin   . Statins    Current Outpatient Prescriptions  Medication Sig Dispense Refill  . albuterol (PROVENTIL HFA;VENTOLIN HFA) 108 (90 BASE) MCG/ACT inhaler Inhale 1 puff into the lungs every 6 (six) hours as needed for wheezing or shortness of breath. 1 Inhaler 11  . aspirin 325 MG tablet Take 325 mg by mouth daily.    [name] b complex vitamins tablet Take 1 tablet by mouth daily.    . cholecalciferol (VITAMIN D) 1000 UNITS tablet Take 2,000 Units by mouth daily.     . Cholecalciferol (VITAMIN D-3 PO) Take 2,000 Units by mouth daily.    . citalopram (CELEXA) 20 MG tablet Take 1 tablet (20 mg total) by mouth daily. 90 tablet 3  . clonazePAM (KLONOPIN) 1 MG tablet Take 1 tablet (1 mg total) by mouth 2 (two) times daily as needed for anxiety. 30 tablet 1  . EPINEPHrine 0.3 mg/0.3  mL IJ SOAJ injection Inject 0.3 mLs (0.3 mg total) into the muscle once. As needed for allergic reaction 2 Device prn  . gabapentin (NEURONTIN) 600 MG tablet Take 600 mg by mouth 3 (three) times daily.    [name] lisinopril-hydrochlorothiazide (ZESTORETIC) 20-12.5 MG tablet Take 1 tablet by mouth daily. 90 tablet 3  . predniSONE (DELTASONE) 20 MG tablet Two tablets daily x 3 days then one tablet daily x 3 days 9 tablet 0  . tiotropium (SPIRIVA HANDIHALER) 18 MCG inhalation capsule Place 1 capsule into inhaler and inhale  daily. 30 capsule 11  . Escitalopram Oxalate (LEXAPRO PO) Take by mouth.    . hydrOXYzine (ATARAX/VISTARIL) 10 MG tablet Take 1 tablet (10 mg total) by mouth at bedtime as needed. (Patient not taking: Reported on 04/14/2015) 90 tablet 3  . Magnesium 250 MG TABS Take 250 mg by mouth 2 (two) times daily.     No current facility-administered medications for this visit.   Social History   Social History  . Marital Status: Single    Spouse Name: N/A  . Number of Children: N/A  . Years of Education: N/A   Occupational History  . Not on file.   Social History Main Topics  . Smoking status: Current Every Day Smoker -- 1.00 packs/day    Types: Cigarettes  . Smokeless tobacco: Not on file  . Alcohol Use: No  . Drug Use: No  . Sexual Activity: Not on file   Other Topics Concern  . Not on file   Social History Narrative   Family History  Problem Relation Age of Onset  . Cancer Mother     colon  . Heart disease Father   . Cancer Maternal Grandfather     lung  . Cancer Maternal Aunt     breast       Objective:    BP 136/80 mmHg  Pulse 71  Temp(Src) 99 F (37.2 C) (Oral)  Resp 16  Ht  (1.549 m)  Wt 151 lb 3.2 oz (68.584 kg)  BMI 28.58 kg/m2  SpO2 95% Physical Exam  Constitutional: She is oriented to person, place, and time. She appears well-developed and well-nourished. No distress.  HENT:  Head: Normocephalic and atraumatic.  Right Ear: External ear normal.  Left Ear: External ear normal.  Nose: Nose normal.  Mouth/Throat: Oropharynx is clear and moist.  Eyes: Conjunctivae and EOM are normal. Pupils are equal, round, and reactive to light.  Neck: Normal range of motion. Neck supple. Carotid bruit is not present. No thyromegaly present.  Cardiovascular: Normal rate, regular rhythm, normal heart sounds and intact distal pulses.  Exam reveals no gallop and no friction rub.   No murmur heard. Pulmonary/Chest: She is in respiratory distress. She has wheezes. She  has no rales.  Mild respiratory distress; speaking in complete sentences.   Abdominal: Soft. Bowel sounds are normal. She exhibits no distension and no mass. There is no tenderness. There is no rebound and no guarding.  Lymphadenopathy:    She has no cervical adenopathy.  Neurological: She is alert and oriented to person, place, and time. No cranial nerve deficit.  Skin: Skin is warm and dry. No rash noted. She is not diaphoretic. No erythema. No pallor.  Psychiatric: She has a normal mood and affect. Her behavior is normal.    [facility] reading (PRIMARY) by  Dr. Katrinka Blazing.  CXR: NAD   PEAK FLOW: 225, 200; ALBUTEROL 5.0/ATROVENT 0.5 NEBULIZER ADMINISTERED. POST-NEB PEAK FLOW: 350, 350. IMPROVED  AIR MOVEMENT; NO WHEEZING ON EXAM POST-NEB. DEPOMEDROL 120MG  IM.     Assessment & Plan:   1. Asthma with acute exacerbation, moderate persistent   2. Essential hypertension   3. HYPERGLYCEMIA   4. Pure hypercholesterolemia   5. Need for prophylactic vaccination and inoculation against influenza   6. Asthma, chronic, mild intermittent, uncomplicated     1.  Asthma Exacerbation:  New.  S/p Depomedrol 120mg  IM.  Duoneb administered in office.  Rx for Prednisone provided; refill of Albuterol, Spiriva.  Sample of Dulera provided to use 1 puff bid.  2.  HTN: controlled; obtain labs; refills provided. 3.  Hyperglycemia: New.  Obtain labs. 4.  Hypercholesterolemia: stable; obtain labs. 5.  S/p flu vaccine. 6.  Tobacco abuse: ongoing issue; COPD also likely contributing.   Orders Placed This Encounter  Procedures  . DG Chest 2 View    Standing Status: Future     Number of Occurrences: 1     Standing Expiration Date: 04/13/2016    Order Specific Question:  Reason for Exam (SYMPTOM  OR DIAGNOSIS REQUIRED)    Answer:  asthma exacerbation    Order Specific Question:  Preferred imaging location?    Answer:  External  . Flu Vaccine QUAD 36+ mos IM  . CBC with Differential/Platelet  . Comprehensive  metabolic panel    Order Specific Question:  Has the patient fasted?    Answer:  Yes  . Lipid panel    Order Specific Question:  Has the patient fasted?    Answer:  Yes  . Hemoglobin A1c   Meds ordered this encounter  Medications  . Cholecalciferol (VITAMIN D-3 PO)    Sig: Take 2,000 Units by mouth daily.  [name]. albuterol (PROVENTIL) (2.5 MG/3ML) 0.083% nebulizer solution 5 mg    Sig:   . ipratropium (ATROVENT) nebulizer solution 0.5 mg    Sig:   . methylPREDNISolone sodium succinate (SOLU-MEDROL) 125 mg/2 mL injection 125 mg    Sig:   . DISCONTD: predniSONE (DELTASONE) 20 MG tablet    Sig: Two tablets daily x 3 days then one tablet daily x 3 days    Dispense:  9 tablet    Refill:  0  . tiotropium (SPIRIVA HANDIHALER) 18 MCG inhalation capsule    Sig: Place 1 capsule into inhaler and inhale daily.    Dispense:  30 capsule    Refill:  11  . lisinopril-hydrochlorothiazide (ZESTORETIC) 20-12.5 MG tablet    Sig: Take 1 tablet by mouth daily.    Dispense:  90 tablet    Refill:  3  . albuterol (PROVENTIL HFA;VENTOLIN HFA) 108 (90 BASE) MCG/ACT inhaler    Sig: Inhale 1 puff into the lungs every 6 (six) hours as needed for wheezing or shortness of breath.    Dispense:  1 Inhaler    Refill:  11  . predniSONE (DELTASONE) 20 MG tablet    Sig: Two tablets daily x 3 days then one tablet daily x 3 days    Dispense:  9 tablet    Refill:  0    No Follow-up on file.  Serrena Linderman Paulita FujitaMartin Aceyn Kathol, M.D. [facility]  [place] [address] [place], [place]  [id number] [phone] phone [phone] fax

## 2015-04-15 LAB — CBC WITH DIFFERENTIAL/PLATELET
Basophils Absolute: 0 10*3/uL (ref 0.0–0.1)
Basophils Relative: 0 % (ref 0–1)
Eosinophils Absolute: 0.5 10*3/uL (ref 0.0–0.7)
Eosinophils Relative: 3 % (ref 0–5)
HEMATOCRIT: 43.3 % (ref 36.0–46.0)
HEMOGLOBIN: 14.5 g/dL (ref 12.0–15.0)
LYMPHS PCT: 28 % (ref 12–46)
Lymphs Abs: 4.6 10*3/uL — ABNORMAL HIGH (ref 0.7–4.0)
MCH: 30.6 pg (ref 26.0–34.0)
MCHC: 33.5 g/dL (ref 30.0–36.0)
MCV: 91.4 fL (ref 78.0–100.0)
MONO ABS: 1 10*3/uL (ref 0.1–1.0)
MPV: 9.6 fL (ref 8.6–12.4)
Monocytes Relative: 6 % (ref 3–12)
Neutro Abs: 10.3 10*3/uL — ABNORMAL HIGH (ref 1.7–7.7)
Neutrophils Relative %: 63 % (ref 43–77)
Platelets: 369 10*3/uL (ref 150–400)
RBC: 4.74 MIL/uL (ref 3.87–5.11)
RDW: 15.2 % (ref 11.5–15.5)
WBC: 16.3 10*3/uL — ABNORMAL HIGH (ref 4.0–10.5)

## 2015-04-15 NOTE — Addendum Note (Signed)
Addended by: Ethelda ChickSMITH, Averleigh Savary M on: 04/15/2015 11:01 AM   Modules accepted: Orders, Medications

## 2015-04-28 ENCOUNTER — Encounter: Payer: Self-pay | Admitting: Family Medicine

## 2015-04-28 ENCOUNTER — Ambulatory Visit (INDEPENDENT_AMBULATORY_CARE_PROVIDER_SITE_OTHER): Payer: Medicare Other | Admitting: Family Medicine

## 2015-04-28 VITALS — BP 100/62 | HR 95 | Temp 98.0°F | Resp 16 | Ht 61.25 in | Wt 149.8 lb

## 2015-04-28 DIAGNOSIS — I10 Essential (primary) hypertension: Secondary | ICD-10-CM

## 2015-04-28 DIAGNOSIS — Z800 Family history of malignant neoplasm of digestive organs: Secondary | ICD-10-CM

## 2015-04-28 DIAGNOSIS — Z0000 Encounter for general adult medical examination without abnormal findings: Secondary | ICD-10-CM

## 2015-04-28 DIAGNOSIS — Z1159 Encounter for screening for other viral diseases: Secondary | ICD-10-CM

## 2015-04-28 DIAGNOSIS — R072 Precordial pain: Secondary | ICD-10-CM

## 2015-04-28 DIAGNOSIS — E7800 Pure hypercholesterolemia, unspecified: Secondary | ICD-10-CM | POA: Diagnosis not present

## 2015-04-28 DIAGNOSIS — R7302 Impaired glucose tolerance (oral): Secondary | ICD-10-CM | POA: Diagnosis not present

## 2015-04-28 DIAGNOSIS — F1020 Alcohol dependence, uncomplicated: Secondary | ICD-10-CM | POA: Diagnosis not present

## 2015-04-28 DIAGNOSIS — Z720 Tobacco use: Secondary | ICD-10-CM

## 2015-04-28 DIAGNOSIS — F1021 Alcohol dependence, in remission: Secondary | ICD-10-CM | POA: Insufficient documentation

## 2015-04-28 LAB — CBC WITH DIFFERENTIAL/PLATELET
BASOS PCT: 0 % (ref 0–1)
Basophils Absolute: 0 10*3/uL (ref 0.0–0.1)
EOS ABS: 0.2 10*3/uL (ref 0.0–0.7)
EOS PCT: 2 % (ref 0–5)
HEMATOCRIT: 43.3 % (ref 36.0–46.0)
Hemoglobin: 14.4 g/dL (ref 12.0–15.0)
LYMPHS PCT: 17 % (ref 12–46)
Lymphs Abs: 1.8 10*3/uL (ref 0.7–4.0)
MCH: 30.2 pg (ref 26.0–34.0)
MCHC: 33.3 g/dL (ref 30.0–36.0)
MCV: 90.8 fL (ref 78.0–100.0)
MONO ABS: 1.1 10*3/uL — AB (ref 0.1–1.0)
MPV: 9.7 fL (ref 8.6–12.4)
Monocytes Relative: 11 % (ref 3–12)
Neutro Abs: 7.3 10*3/uL (ref 1.7–7.7)
Neutrophils Relative %: 70 % (ref 43–77)
PLATELETS: 363 10*3/uL (ref 150–400)
RBC: 4.77 MIL/uL (ref 3.87–5.11)
RDW: 14.1 % (ref 11.5–15.5)
WBC: 10.4 10*3/uL (ref 4.0–10.5)

## 2015-04-28 LAB — LIPID PANEL
Cholesterol: 249 mg/dL — ABNORMAL HIGH (ref 125–200)
HDL: 65 mg/dL (ref >=46)
LDL CALC: 170 mg/dL — AB (ref <130)
TRIGLYCERIDES: 69 mg/dL (ref <150)
Total CHOL/HDL Ratio: 3.8 Ratio (ref <=5.0)
VLDL: 14 mg/dL (ref <30)

## 2015-04-28 LAB — POCT URINALYSIS DIP (MANUAL ENTRY)
Bilirubin, UA: NEGATIVE
Glucose, UA: NEGATIVE
Ketones, POC UA: NEGATIVE
Leukocytes, UA: NEGATIVE
Nitrite, UA: NEGATIVE
PH UA: 5
PROTEIN UA: NEGATIVE
SPEC GRAV UA: 1.02
Urobilinogen, UA: 0.2

## 2015-04-28 LAB — HEPATITIS C ANTIBODY: HCV Ab: NEGATIVE

## 2015-04-28 NOTE — Progress Notes (Signed)
Subjective:    Patient ID: Tasha [name], female    DOB: June 25, 1950, 64 y.o.   MRN: [id number]  04/28/2015  Annual Exam   HPI This 64 y.o. female presents for Annual Wellness Examination.  Last physical:  2001 Pap smear:  3 years ago with Ernestina Penna with [facility]. Mammogram: 09-07-2013 Colonoscopy:  03-06-2014 Bone density:  06-26-2014 very mild osteopenia. TDAP:  10/23/2014 Pneumovax:  2010; no Prevnar 13.  Zostavax: 2016 Influenza: 04/14/2015 Eye exam: +glasses; 04/2014; scheduled for 05/2015; early cataracts Dental exam:    Chest pain: two days ago, had substernal chest pain that radiated from neck to epigastric region; radiation into upper back; +nausea; almost vomited.  Duration 15 minutes.  Occurred in bed late at night.  Father with AMI at age 58.  No SOB; no diaphoresis.  No radiation into L arm.  No exertional component.    Tremor with using mouse on R arm.   Depression and anxiety: followed by [facility] Monte; Klonopin 0.5mg  qhs.    HTN: Patient reports good compliance with medication, good tolerance to medication, and good symptom control.    Glucose intolerance: no sweetened beverages often.  Loves bread.  Eats a lot of sandwiches. Likes sweets.     Review of Systems  Constitutional: Negative for fever, chills, diaphoresis, activity change, appetite change, fatigue and unexpected weight change.  HENT: Negative for congestion, dental problem, drooling, ear discharge, ear pain, facial swelling, hearing loss, mouth sores, nosebleeds, postnasal drip, rhinorrhea, sinus pressure, sneezing, sore throat, tinnitus, trouble swallowing and voice change.   Eyes: Negative for photophobia, pain, discharge, redness, itching and visual disturbance.  Respiratory: Negative for apnea, cough, choking, chest tightness, shortness of breath, wheezing and stridor.   Cardiovascular: Positive for chest pain. Negative for palpitations and leg swelling.  Gastrointestinal: Positive for  nausea. Negative for vomiting, abdominal pain, diarrhea, constipation, blood in stool, abdominal distention, anal bleeding and rectal pain.  Endocrine: Negative for cold intolerance, heat intolerance, polydipsia, polyphagia and polyuria.  Genitourinary: Negative for dysuria, urgency, frequency, hematuria, flank pain, decreased urine volume, vaginal bleeding, vaginal discharge, enuresis, difficulty urinating, genital sores, vaginal pain, menstrual problem, pelvic pain and dyspareunia.  Musculoskeletal: Negative for myalgias, back pain, joint swelling, arthralgias, gait problem, neck pain and neck stiffness.  Skin: Negative for color change, pallor, rash and wound.  Allergic/Immunologic: Negative for environmental allergies, food allergies and immunocompromised state.  Neurological: Positive for tremors. Negative for dizziness, seizures, syncope, facial asymmetry, speech difficulty, weakness, light-headedness, numbness and headaches.  Hematological: Negative for adenopathy. Does not bruise/bleed easily.  Psychiatric/Behavioral: Negative for suicidal ideas, hallucinations, behavioral problems, confusion, sleep disturbance, self-injury, dysphoric mood, decreased concentration and agitation. The patient is not nervous/anxious and is not hyperactive.     Past Medical History  Diagnosis Date  . Psoriasiform dermatitis   . Allergy   . Depression   . GERD (gastroesophageal reflux disease)   . Hyperlipidemia   . Anxiety   . Clotting disorder (HCC)   . Hypertension   . Arthritis     DDD lumbar; B thumbs  . Asthma     age 22   Past Surgical History  Procedure Laterality Date  . Nose surgery    . Tonsillectomy    . Tubal ligation     Allergies  Allergen Reactions  . Acitretin Swelling and Rash  . Other     Unidentified multiple food allergies   . Oxycodone-Aspirin   . Percodan [Oxycodone-Aspirin]   . Statins Other (See Comments)  Muscle aches    Current Outpatient Prescriptions    Medication Sig Dispense Refill  . albuterol (PROVENTIL HFA;VENTOLIN HFA) 108 (90 BASE) MCG/ACT inhaler Inhale 1 puff into the lungs every 6 (six) hours as needed for wheezing or shortness of breath. 1 Inhaler 11  . aspirin 325 MG tablet Take 325 mg by mouth daily.    . cholecalciferol (VITAMIN D) 1000 UNITS tablet Take 2,000 Units by mouth daily.     . clonazePAM (KLONOPIN) 1 MG tablet Take 1 tablet (1 mg total) by mouth 2 (two) times daily as needed for anxiety. 30 tablet 1  . EPINEPHrine 0.3 mg/0.3 mL IJ SOAJ injection Inject 0.3 mLs (0.3 mg total) into the muscle once. As needed for allergic reaction 2 Device prn  . escitalopram (LEXAPRO) 10 MG tablet Take 10 mg by mouth daily.   0  . lisinopril-hydrochlorothiazide (ZESTORETIC) 20-12.5 MG tablet Take 1 tablet by mouth daily. 90 tablet 3  . tiotropium (SPIRIVA HANDIHALER) 18 MCG inhalation capsule Place 1 capsule into inhaler and inhale daily. 30 capsule 11  . albuterol (PROVENTIL) (2.5 MG/3ML) 0.083% nebulizer solution Take 3 mLs (2.5 mg total) by nebulization every 6 (six) hours as needed for wheezing or shortness of breath. 360 mL 12  . b complex vitamins tablet Take 1 tablet by mouth daily.    . citalopram (CELEXA) 20 MG tablet Take 1 tablet (20 mg total) by mouth daily. (Patient not taking: Reported on 04/28/2015) 90 tablet 3  . varenicline (CHANTIX CONTINUING MONTH PAK) 1 MG tablet Take 1 tablet (1 mg total) by mouth 2 (two) times daily. 60 tablet 4  . varenicline (CHANTIX STARTING MONTH PAK) 0.5 MG X 11 & 1 MG X 42 tablet Take one 0.5 mg tablet by mouth once daily for 3 days, then increase to one 0.5 mg tablet twice daily for 4 days, then increase to one 1 mg tablet twice daily. 53 tablet 0   No current facility-administered medications for this visit.   Social History   Social History  . Marital Status: Single    Spouse Name: N/A  . Number of Children: N/A  . Years of Education: N/A   Occupational History  . Not on file.    Social History Main Topics  . Smoking status: Current Every Day Smoker -- 1.00 packs/day    Types: Cigarettes  . Smokeless tobacco: Not on file  . Alcohol Use: No  . Drug Use: No  . Sexual Activity: Not Currently   Other Topics Concern  . Not on file   Social History Narrative   Marital status: single; not dating and not interested      Children: none      Lives: alone with dog      Employment:  Semi-retired; works a little bit; [employer] cabin crew since 1980; maintains one client      Tobacco: 1 ppd x 50 years      Alcohol: quit; IN RECOVERY SINCE 09/25/1985.  Attends AA twice weekly; still sponsors others.      Drugs:  None      Exercise:       ADLs: independent with ADLs; no assistant devices.      Advanced Directives:  Yes.  desires FULL CODE.   Family History  Problem Relation Age of Onset  . Cancer Mother 79    colorectal cancer  . Stroke Mother 8    CVA  . Alcohol abuse Mother   . Diabetes Mother   . Hypertension  Mother   . Heart disease Father   . Cancer Maternal Grandfather     lung  . Cancer Maternal Aunt     breast  . Alcohol abuse Brother   . Heart attack Brother        Objective:    BP 100/62 mmHg  Pulse 95  Temp(Src) 98 F (36.7 C) (Oral)  Resp 16  Ht 5' 1.25" (1.556 m)  Wt 149 lb 12.8 oz (67.949 kg)  BMI 28.06 kg/m2  SpO2 93% Physical Exam  Constitutional: She is oriented to person, place, and time. She appears well-developed and well-nourished. No distress.  HENT:  Head: Normocephalic and atraumatic.  Right Ear: External ear normal.  Left Ear: External ear normal.  Nose: Nose normal.  Mouth/Throat: Oropharynx is clear and moist.  Eyes: Conjunctivae and EOM are normal. Pupils are equal, round, and reactive to light.  Neck: Normal range of motion and full passive range of motion without pain. Neck supple. No JVD present. Carotid bruit is not present. No thyromegaly present.  Cardiovascular: Normal rate, regular rhythm and normal heart  sounds.  Exam reveals no gallop and no friction rub.   No murmur heard. Pulmonary/Chest: Effort normal and breath sounds normal. She has no wheezes. She has no rales.  Abdominal: Soft. Bowel sounds are normal. She exhibits no distension and no mass. There is no tenderness. There is no rebound and no guarding.  Musculoskeletal:       Right shoulder: Normal.       Left shoulder: Normal.       Cervical back: Normal.  Lymphadenopathy:    She has no cervical adenopathy.  Neurological: She is alert and oriented to person, place, and time. She has normal reflexes. No cranial nerve deficit. She exhibits normal muscle tone. Coordination normal.  Skin: Skin is warm and dry. No rash noted. She is not diaphoretic. No erythema. No pallor.  Psychiatric: She has a normal mood and affect. Her behavior is normal. Judgment and thought content normal.  Nursing note and vitals reviewed.  EKG: NSR; no acute ST changes.     Assessment & Plan:   1. Encounter for Medicare annual wellness exam   2. Precordial pain   3. Pure hypercholesterolemia   4. Essential hypertension   5. Need for hepatitis C screening test   6. Glucose intolerance (impaired glucose tolerance)   7. Tobacco abuse   8. Family history of colorectal cancer   9. 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 Annual Wellness Examination Initial: New.  Anticipatory guidance --- weight loss, exercise, ASA 81mg  daily, 3 servings of calcum daily. Pap smear UTD.  Mammogram UTD.  Colonoscopy UTD. Bone density UTD.  Independent with ADLs. Undergoing treatment for depression by psychiatry. No hearing loss.  Low fall risk.  Advanced directives; desires full code. 2.  Chest pain: New. Onset two days ago; atypical in nature yet family history of CAD; refer to cardiology for risk stratification; to ED for recurrence. 3.  Hypercholesterolemia; uncontrolled; obtain labs.   4.  HTN: controlled; obtain labs; blood pressure borderline low today; if remains low, will  adjust medication. 5.  Glucose intolerance: New.  Recommend weight loss, exercise, low-sugar, and low-carbohydrate food choices.  6.  Tobacco abuse: Encourage cessation. 7.  Family history of colorectal cancer: warrants colonoscopies every 5 years. 8. Alcoholism in recovery: 30 years of sobriety; continues to attend AA meetings twice weekly; functions as a sponsor.    Orders Placed This Encounter  Procedures  . CBC with Differential/Platelet  . Lipid panel    Order Specific Question:  Has the patient fasted?    Answer:  Yes  . Hepatitis C antibody  . Ambulatory referral to Cardiology    Referral Priority:  Routine    Referral Type:  Consultation    Referral Reason:  Specialty Services Required    Requested Specialty:  Cardiology    Number of Visits Requested:  1  . POCT urinalysis dipstick  . EKG 12-Lead   No orders of the defined types were placed in this encounter.    Return in about 3 months (around 07/29/2015) for recheck.    Destany Severns Paulita Fujita, M.D. [facility] [address] [place], [place]  [address] [phone] phone [phone] fax

## 2015-04-28 NOTE — Patient Instructions (Signed)

## 2015-04-29 ENCOUNTER — Other Ambulatory Visit: Payer: Self-pay

## 2015-04-29 DIAGNOSIS — Z1231 Encounter for screening mammogram for malignant neoplasm of breast: Secondary | ICD-10-CM

## 2015-04-30 ENCOUNTER — Telehealth: Payer: Self-pay

## 2015-04-30 DIAGNOSIS — J4540 Moderate persistent asthma, uncomplicated: Secondary | ICD-10-CM

## 2015-04-30 NOTE — Telephone Encounter (Signed)
Patient stopped by [facility] [address] today inquiring about the nebulizer machine that was suppose to be ordered for her. [facility] didn't have an order for her and they don't sell the actual machines either. Please contact patient with information as to where she can pick this up from.

## 2015-04-30 NOTE — Telephone Encounter (Signed)
Dr. Katrinka BlazingSmith did you mean to order this. If so I can forward to [facility]. I did not see a plan.

## 2015-05-01 MED ORDER — ALBUTEROL SULFATE (2.5 MG/3ML) 0.083% IN NEBU: 2.5000 mg | INHALATION_SOLUTION | Freq: Four times a day (QID) | RESPIRATORY_TRACT | Status: DC | PRN

## 2015-05-01 NOTE — Telephone Encounter (Signed)
Left message for pt

## 2015-05-01 NOTE — Telephone Encounter (Signed)
Sent Mandy the order.

## 2015-05-01 NOTE — Telephone Encounter (Signed)
Yes, I did intend to order a nebulizer for the patient; can you please send order to [facility].  I just sent in the order for the nebulizer solution to the pharmacy; please apologize to patient; this is my error.

## 2015-05-05 ENCOUNTER — Ambulatory Visit: Payer: Medicare Other | Admitting: Family Medicine

## 2015-05-15 ENCOUNTER — Ambulatory Visit
Admission: RE | Admit: 2015-05-15 | Discharge: 2015-05-15 | Disposition: A | Discharge status: Home / Self Care | Payer: Medicare Other | Source: Ambulatory Visit

## 2015-05-15 DIAGNOSIS — Z1231 Encounter for screening mammogram for malignant neoplasm of breast: Secondary | ICD-10-CM

## 2015-05-19 NOTE — Progress Notes (Signed)
Cardiology Office Note   Date:  05/20/2015   ID:  Tasha [name], Tasha [name] 22-Sep-1950, MRN [id number]  PCP:  Robyn Haber, MD  Cardiologist:   Sharol Harness, MD   Chief Complaint  Patient presents with  . New Evaluation    pt went to PCP for chest tightness that lasted 15 minutes.       History of Present Illness: Tasha [name] is a 64 y.o. female with hypertension, hyperlipidemia, and DVT who presents for an evaluation of chest pain.  Tasha [name] was seen for a routine physical by Tasha [name] PCP, Dr. Larwance Rote, on 11/28.  At that appointment Tasha [name] mentioned an episode of substernal chest pain.  Tasha [name] had an episode of chest pain approximately 1 month ago.  At 9:50 pm Tasha [name] had an episode of substernal chest pain that lasted for 15 minutes.  The tightness was 3/10 and was not associated with shortness of breath.  It radiated up to Tasha [name] throat and down to Tasha [name] stomach.  There was nausea without emesis.  The pain occurred when Tasha [name] was laying down.  There was no diaphoresis but Tasha [name] did have nausea without emesis.  Tasha [name] is especilallly concerned because Tasha [name] father died of an MI when he was Tasha [name] age.  Tasha [name] walks Tasha [name] dogs slowly three times daily for 20 minutes each time.  In the past Tasha [name] liked to swim at the aquatic center but has not been lately.  Tasha [name] does not have exertional chest pain.  Tasha [name] denies lower extremity edema, orthopnea or PND.  Tasha [name] continues to smoke 1 ppd.  Tasha [name] was able to quit cold [place] for 3 years back in the 90s.  Tasha [name] is a recovered alcoholic and has not had any alcohol in 30 years.    Past Medical History  Diagnosis Date  . Psoriasiform dermatitis   . Allergy   . Depression   . GERD (gastroesophageal reflux disease)   . Hyperlipidemia   . Anxiety   . Clotting disorder ([place])   . Hypertension   . Arthritis     DDD lumbar; B thumbs  . Asthma     age 51    Past Surgical History  Procedure Laterality Date  . Nose surgery    . Tonsillectomy    . Tubal ligation        Current Outpatient Prescriptions  Medication Sig Dispense Refill  . albuterol (PROVENTIL HFA;VENTOLIN HFA) 108 (90 BASE) MCG/ACT inhaler Inhale 1 puff into the lungs every 6 (six) hours as needed for wheezing or shortness of breath. 1 Inhaler 11  . albuterol (PROVENTIL) (2.5 MG/3ML) 0.083% nebulizer solution Take 3 mLs (2.5 mg total) by nebulization every 6 (six) hours as needed for wheezing or shortness of breath. 360 mL 12  . aspirin 325 MG tablet Take 325 mg by mouth daily.    [name] b complex vitamins tablet Take 1 tablet by mouth daily.    . cholecalciferol (VITAMIN D) 1000 UNITS tablet Take 2,000 Units by mouth daily.     . clonazePAM (KLONOPIN) 1 MG tablet Take 1 tablet (1 mg total) by mouth 2 (two) times daily as needed for anxiety. 30 tablet 1  . EPINEPHrine 0.3 mg/0.3 mL IJ SOAJ injection Inject 0.3 mLs (0.3 mg total) into the muscle once. As needed for allergic reaction 2 Device prn  . escitalopram (LEXAPRO) 10 MG tablet Take 10 mg by mouth daily.   0  . lisinopril-hydrochlorothiazide (ZESTORETIC) 20-12.5 MG tablet Take 1 tablet by mouth  daily. 90 tablet 3  . tiotropium (SPIRIVA HANDIHALER) 18 MCG inhalation capsule Place 1 capsule into inhaler and inhale daily. 30 capsule 11  . citalopram (CELEXA) 20 MG tablet Take 1 tablet (20 mg total) by mouth daily. (Patient not taking: Reported on 04/28/2015) 90 tablet 3  . varenicline (CHANTIX CONTINUING MONTH PAK) 1 MG tablet Take 1 tablet (1 mg total) by mouth 2 (two) times daily. 60 tablet 4  . varenicline (CHANTIX STARTING MONTH PAK) 0.5 MG X 11 & 1 MG X 42 tablet Take one 0.5 mg tablet by mouth once daily for 3 days, then increase to one 0.5 mg tablet twice daily for 4 days, then increase to one 1 mg tablet twice daily. 53 tablet 0   No current facility-administered medications for this visit.    Allergies:   Acitretin; Other; Oxycodone-aspirin; Percodan; and Statins    Social History:  The patient  reports that Tasha [name] has been smoking  Cigarettes.  Tasha [name] has been smoking about 1.00 pack per day. Tasha [name] does not have any smokeless tobacco history on file. Tasha [name] reports that Tasha [name] does not drink alcohol or use illicit drugs.   Family History:  The patient's family history includes Alcohol abuse in Tasha [name] brother and mother; Cancer in Tasha [name] maternal aunt and maternal grandfather; Cancer (age of onset: 52) in Tasha [name] mother; Diabetes in Tasha [name] mother; Heart attack in Tasha [name] brother; Heart disease in Tasha [name] father; Hypertension in Tasha [name] mother; Stroke (age of onset: 53) in Tasha [name] mother.    ROS:  Please see the history of present illness.   Otherwise, review of systems are positive for none.   All other systems are reviewed and negative.    PHYSICAL EXAM: VS:  BP 96/58 mmHg  Pulse 70  Ht 5' 1.5" (1.562 m)  Wt 66.361 kg (146 lb 4.8 oz)  BMI 27.20 kg/m2 , BMI Body mass index is 27.2 kg/(m^2). GENERAL:  Well appearing HEENT:  Pupils equal round and reactive, fundi not visualized, oral mucosa unremarkable NECK:  No jugular venous distention, waveform within normal limits, carotid upstroke brisk and symmetric, no bruits, no thyromegaly LYMPHATICS:  No cervical adenopathy LUNGS:  Clear to auscultation bilaterally CHEST: Bowel sounds heard on cardiac exam HEART:  Distant heart sounds.  RRR.  PMI not displaced or sustained,S1 and S2 within normal limits, no S3, no S4, no clicks, no rubs, no murmurs ABD:  Flat, positive bowel sounds normal in frequency in pitch, no bruits, no rebound, no guarding, no midline pulsatile mass, no hepatomegaly, no splenomegaly EXT:  2 plus pulses throughout, no edema, no cyanosis no clubbing SKIN:  No rashes no nodules NEURO:  Cranial nerves II through XII grossly intact, motor grossly intact throughout PSYCH:  Cognitively intact, oriented to person place and time    EKG:  EKG is not ordered today. The ekg 04/28/15 demonstrates sinus rhythm rate 75 bpm.   Recent Labs: 04/14/2015: ALT 15; BUN 21; Creat 0.87; Potassium 4.5; Sodium  142 04/28/2015: Hemoglobin 14.4; Platelets 363    Lipid Panel    Component Value Date/Time   CHOL 249* 04/28/2015 0918   TRIG 69 04/28/2015 0918   HDL 65 04/28/2015 0918   CHOLHDL 3.8 04/28/2015 0918   VLDL 14 04/28/2015 0918   LDLCALC 170* 04/28/2015 0918      Wt Readings from Last 3 Encounters:  05/20/15 66.361 kg (146 lb 4.8 oz)  04/28/15 67.949 kg (149 lb 12.8 oz)  04/14/15 68.584 kg (151 lb 3.2 oz)  ASSESSMENT AND PLAN:  # Chest pain: Tasha [name] chest pain is atypical and seems more likely to be due to a GI etiology.  I'm suspicious that Tasha [name] met have a hiatal hernia based on hearing abdominal sounds on Tasha [name] chest examination. Tasha [name] does have risk factors including family history and ongoing tobacco abuse. Therefore we will refer Tasha [name] for [facility], as Tasha [name] is unable to exercise.  # Tobacco abuse: Tasha [name] is interested in quitting smoking.  We discussed smoking cessation or 8 minutes.Tasha [name] was provided with a prescription for a Chantix starter pack.  # CV Disease Prevention: Based on Tasha [name] clinical risk factors, Tasha [name] ASCVD 10 year risk is 8.8%. His means that a statin and aspirin are indicated. This risk will be reduced to 4.9% if Tasha [name] were not a smoker.  We discussed the need to quit smoking.  We will re-eval in 3 months.  f Tasha [name] continues to smoke at that time we will start Tasha [name] on statin therapy.  # Hypertension: blood pressure is low today.  Tasha [name] denies lightheadedness or dizziness.  We will not make any changes to Tasha [name] lisinopril/HCTZ regimen.   Current medicines are reviewed at length with the patient today.  The patient does not have concerns regarding medicines.  The following changes have been made:  chantix  Labs/ tests ordered today include:   Orders Placed This Encounter  Procedures  . Myocardial Perfusion Imaging     Disposition:   FU with Dorrien Grunder C. Oval Linsey, MD, [facility] in 3 months.    Signed, Corryn Madewell C. Oval Linsey, MD, [facility]  05/20/2015 11:11 AM      [place]

## 2015-05-20 ENCOUNTER — Ambulatory Visit (INDEPENDENT_AMBULATORY_CARE_PROVIDER_SITE_OTHER): Payer: Medicare Other | Admitting: Cardiovascular Disease

## 2015-05-20 ENCOUNTER — Encounter: Payer: Self-pay | Admitting: Cardiovascular Disease

## 2015-05-20 ENCOUNTER — Telehealth: Payer: Self-pay | Admitting: Family Medicine

## 2015-05-20 ENCOUNTER — Telehealth (HOSPITAL_COMMUNITY): Payer: Self-pay

## 2015-05-20 VITALS — BP 96/58 | HR 70 | Ht 61.5 in | Wt 146.3 lb

## 2015-05-20 DIAGNOSIS — Z720 Tobacco use: Secondary | ICD-10-CM | POA: Diagnosis not present

## 2015-05-20 DIAGNOSIS — E785 Hyperlipidemia, unspecified: Secondary | ICD-10-CM | POA: Diagnosis not present

## 2015-05-20 DIAGNOSIS — R072 Precordial pain: Secondary | ICD-10-CM

## 2015-05-20 DIAGNOSIS — F17200 Nicotine dependence, unspecified, uncomplicated: Secondary | ICD-10-CM

## 2015-05-20 DIAGNOSIS — I10 Essential (primary) hypertension: Secondary | ICD-10-CM | POA: Diagnosis not present

## 2015-05-20 DIAGNOSIS — Z716 Tobacco abuse counseling: Secondary | ICD-10-CM

## 2015-05-20 MED ORDER — VARENICLINE TARTRATE 0.5 MG X 11 & 1 MG X 42 PO MISC: ORAL | Status: DC

## 2015-05-20 MED ORDER — VARENICLINE TARTRATE 1 MG PO TABS: 1.0000 mg | ORAL_TABLET | Freq: Two times a day (BID) | ORAL | Status: DC

## 2015-05-20 NOTE — Telephone Encounter (Signed)
Encounter complete. 

## 2015-05-20 NOTE — Patient Instructions (Signed)
Your physician has requested that you have a lexiscan myoview. For further information please visit [email or web address]. Please follow instruction sheet, as given.  Your physician recommends that you schedule a follow-up appointment in  3 MONTHS [place].  Your physician discussed the hazards of tobacco use. Tobacco use cessation is recommended and techniques and options to help you quit were discussed.  USE THE STARTER PACK OF CHANTIX FOR ONE MONTH   MAY USE CHANTIX FOR UP TO FOR MONTHS-REGULAR DOSE.   Smoking Cessation, Tips for Success If you are ready to quit smoking, congratulations! You have chosen to help yourself be healthier. Cigarettes bring nicotine, tar, carbon monoxide, and other irritants into your body. Your lungs, heart, and blood vessels will be able to work better without these poisons. There are many different ways to quit smoking. Nicotine gum, nicotine patches, a nicotine inhaler, or nicotine nasal spray can help with physical craving. Hypnosis, support groups, and medicines help break the habit of smoking. WHAT THINGS CAN I DO TO MAKE QUITTING EASIER?  Here are some tips to help you quit for good:  Pick a date when you will quit smoking completely. Tell all of your friends and family about your plan to quit on that date.  Do not try to slowly cut down on the number of cigarettes you are smoking. Pick a quit date and quit smoking completely starting on that day.  Throw away all cigarettes.   Clean and remove all ashtrays from your home, work, and car.  On a card, write down your reasons for quitting. Carry the card with you and read it when you get the urge to smoke.  Cleanse your body of nicotine. Drink enough water and fluids to keep your urine clear or pale yellow. Do this after quitting to flush the nicotine from your body.  Learn to predict your moods. Do not let a bad situation be your excuse to have a cigarette. Some situations in your life might tempt you  into wanting a cigarette.  Never have "just one" cigarette. It leads to wanting another and another. Remind yourself of your decision to quit.  Change habits associated with smoking. If you smoked while driving or when feeling stressed, try other activities to replace smoking. Stand up when drinking your coffee. Brush your teeth after eating. Sit in a different chair when you read the paper. Avoid alcohol while trying to quit, and try to drink fewer caffeinated beverages. Alcohol and caffeine may urge you to smoke.  Avoid foods and drinks that can trigger a desire to smoke, such as sugary or spicy foods and alcohol.  Ask people who smoke not to smoke around you.  Have something planned to do right after eating or having a cup of coffee. For example, plan to take a walk or exercise.  Try a relaxation exercise to calm you down and decrease your stress. Remember, you may be tense and nervous for the first 2 weeks after you quit, but this will pass.  Find new activities to keep your hands busy. Play with a pen, coin, or rubber band. Doodle or draw things on paper.  Brush your teeth right after eating. This will help cut down on the craving for the taste of tobacco after meals. You can also try mouthwash.   Use oral substitutes in place of cigarettes. Try using lemon drops, carrots, cinnamon sticks, or chewing gum. Keep them handy so they are available when you have the urge to smoke.  When  you have the urge to smoke, try deep breathing.  Designate your home as a nonsmoking area.  If you are a heavy smoker, ask your health care provider about a prescription for nicotine chewing gum. It can ease your withdrawal from nicotine.  Reward yourself. Set aside the cigarette money you save and buy yourself something nice.  Look for support from others. Join a support group or smoking cessation program. Ask someone at home or at work to help you with your plan to quit smoking.  Always ask yourself,  "Do I need this cigarette or is this just a reflex?" Tell yourself, "Today, I choose not to smoke," or "I do not want to smoke." You are reminding yourself of your decision to quit.  Do not replace cigarette smoking with electronic cigarettes (commonly called e-cigarettes). The safety of e-cigarettes is unknown, and some may contain harmful chemicals.  If you relapse, do not give up! Plan ahead and think about what you will do the next time you get the urge to smoke. HOW WILL I FEEL WHEN I QUIT SMOKING? You may have symptoms of withdrawal because your body is used to nicotine (the addictive substance in cigarettes). You may crave cigarettes, be irritable, feel very hungry, cough often, get headaches, or have difficulty concentrating. The withdrawal symptoms are only temporary. They are strongest when you first quit but will go away within 10-14 days. When withdrawal symptoms occur, stay in control. Think about your reasons for quitting. Remind yourself that these are signs that your body is healing and getting used to being without cigarettes. Remember that withdrawal symptoms are easier to treat than the major diseases that smoking can cause.  Even after the withdrawal is over, expect periodic urges to smoke. However, these cravings are generally short lived and will go away whether you smoke or not. Do not smoke! WHAT RESOURCES ARE AVAILABLE TO HELP ME QUIT SMOKING? Your health care provider can direct you to community resources or hospitals for support, which may include:  Group support.  Education.  Hypnosis.  Therapy.   This information is not intended to replace advice given to you by your health care provider. Make sure you discuss any questions you have with your health care provider.   Document Released: 02/13/2004 Document Revised: 06/07/2014 Document Reviewed: 11/02/2012 Elsevier Interactive Patient Education [employer]

## 2015-05-20 NOTE — Telephone Encounter (Signed)
Pt came by with concerns about quantity limits with Rx: Ventolin HFA... Placed note in incoming- RX box for review. [facility] [address]  [phone] Judie Petit(M)

## 2015-05-21 NOTE — Telephone Encounter (Signed)
I did not see this in my box. Britta MccreedyBarbara have you seen this form?

## 2015-05-22 ENCOUNTER — Ambulatory Visit (HOSPITAL_COMMUNITY)
Admission: RE | Admit: 2015-05-22 | Discharge: 2015-05-22 | Disposition: A | Discharge status: Home / Self Care | Payer: Medicare Other | Source: Ambulatory Visit | Attending: Cardiovascular Disease | Admitting: Cardiovascular Disease

## 2015-05-22 DIAGNOSIS — F17200 Nicotine dependence, unspecified, uncomplicated: Secondary | ICD-10-CM

## 2015-05-22 DIAGNOSIS — Z8249 Family history of ischemic heart disease and other diseases of the circulatory system: Secondary | ICD-10-CM | POA: Insufficient documentation

## 2015-05-22 DIAGNOSIS — R072 Precordial pain: Secondary | ICD-10-CM | POA: Diagnosis not present

## 2015-05-22 DIAGNOSIS — R9439 Abnormal result of other cardiovascular function study: Secondary | ICD-10-CM | POA: Diagnosis not present

## 2015-05-22 DIAGNOSIS — R110 Nausea: Secondary | ICD-10-CM | POA: Insufficient documentation

## 2015-05-22 DIAGNOSIS — Z720 Tobacco use: Secondary | ICD-10-CM

## 2015-05-22 DIAGNOSIS — I10 Essential (primary) hypertension: Secondary | ICD-10-CM | POA: Insufficient documentation

## 2015-05-22 LAB — MYOCARDIAL PERFUSION IMAGING
CHL CUP NUCLEAR SRS: 4
CHL CUP NUCLEAR SSS: 9
LV dias vol: 74 mL
LV sys vol: 25 mL
NUC STRESS TID: 1.16
Peak HR: 109 {beats}/min
Rest HR: 73 {beats}/min
SDS: 5

## 2015-05-22 MED ORDER — TECHNETIUM TC 99M SESTAMIBI GENERIC - CARDIOLITE
32.5000 | Freq: Once | INTRAVENOUS | Status: AC | PRN
  Administered 2015-05-22: 32.5 via INTRAVENOUS

## 2015-05-22 MED ORDER — REGADENOSON 0.4 MG/5ML IV SOLN
0.4000 mg | Freq: Once | INTRAVENOUS | Status: AC
  Administered 2015-05-22: 0.4 mg via INTRAVENOUS

## 2015-05-22 MED ORDER — TECHNETIUM TC 99M SESTAMIBI GENERIC - CARDIOLITE
10.6000 | Freq: Once | INTRAVENOUS | Status: AC | PRN
  Administered 2015-05-22: 10.6 via INTRAVENOUS

## 2015-05-23 NOTE — Telephone Encounter (Signed)
I have never received anything from pharm regarding this. Called pt to see if she is still having a problem, and she stated that a company called [facility] had called her about this med and she has not gotten through to them to return call yet. Advised her that I believe that it is another comp trying to get her to order her nebulizer solution through them. She stated that she has gotten a big shipment from [facility] recently and won't need any for a long time.

## 2015-05-27 DIAGNOSIS — Z720 Tobacco use: Secondary | ICD-10-CM | POA: Insufficient documentation

## 2015-05-27 DIAGNOSIS — Z800 Family history of malignant neoplasm of digestive organs: Secondary | ICD-10-CM | POA: Insufficient documentation

## 2015-05-29 ENCOUNTER — Telehealth: Payer: Self-pay | Admitting: *Deleted

## 2015-05-29 NOTE — Telephone Encounter (Signed)
-----   Message from Tiffany Ralston, MD sent at 05/28/2015  8:04 AM EST ----- Stress test was mildly abnormal, but the best way to know for sure is to do a heart catheterization.  Please schedule for cath, or if she wants to discuss first, please schedule for follow up. 

## 2015-05-29 NOTE — Telephone Encounter (Signed)
LEFT MESSAGE TO CALL BACK

## 2015-05-29 NOTE — Telephone Encounter (Signed)
Spoke to patient. Result given . Verbalized understanding Patient states she would like to discuss about the results before scheduling cath  appointment schedule 06/10/15 at 4 pm

## 2015-05-29 NOTE — Telephone Encounter (Signed)
Returning your call. °

## 2015-05-29 NOTE — Telephone Encounter (Signed)
-----   Message from Chilton Siiffany Remsen, MD sent at 05/28/2015  8:04 AM EST ----- Stress test was mildly abnormal, but the best way to know for sure is to do a heart catheterization.  Please schedule for cath, or if she wants to discuss first, please schedule for follow up.

## 2015-06-10 ENCOUNTER — Ambulatory Visit (INDEPENDENT_AMBULATORY_CARE_PROVIDER_SITE_OTHER): Payer: Medicare Other | Admitting: Cardiovascular Disease

## 2015-06-10 ENCOUNTER — Encounter: Payer: Self-pay | Admitting: Cardiovascular Disease

## 2015-06-10 VITALS — BP 98/70 | HR 80 | Ht 61.5 in | Wt 146.1 lb

## 2015-06-10 DIAGNOSIS — R931 Abnormal findings on diagnostic imaging of heart and coronary circulation: Secondary | ICD-10-CM | POA: Diagnosis not present

## 2015-06-10 DIAGNOSIS — R072 Precordial pain: Secondary | ICD-10-CM

## 2015-06-10 DIAGNOSIS — R9439 Abnormal result of other cardiovascular function study: Secondary | ICD-10-CM

## 2015-06-10 DIAGNOSIS — I10 Essential (primary) hypertension: Secondary | ICD-10-CM

## 2015-06-10 HISTORY — DX: Abnormal result of other cardiovascular function study: R94.39

## 2015-06-10 MED ORDER — METOPROLOL TARTRATE 25 MG PO TABS: 25.0000 mg | ORAL_TABLET | Freq: Two times a day (BID) | ORAL | Status: DC

## 2015-06-10 MED ORDER — LISINOPRIL 20 MG PO TABS: 20.0000 mg | ORAL_TABLET | Freq: Every day | ORAL | Status: DC

## 2015-06-10 NOTE — Patient Instructions (Addendum)
Dr Duke Salviaandolph has recommended making the following medication changes: STOP Lisinopril-Hydrochlorothiazide START Lisinopril 20 mg - take 1 tablet by mouth daily START Metoprolol Tartrate 25 mg - take 1 tablet by mouth twice daily  >>New prescriptions have been sent to the pharmacy electronically  Your physician recommends that you schedule a follow-up appointment in 3 months.  If you need a refill on your cardiac medications before your next appointment, please call your pharmacy.

## 2015-06-10 NOTE — Progress Notes (Signed)
Cardiology Office Note   Date:  06/10/2015   ID:  DALAYZA ZAMBRANA, DOB Jan 19, 1951, MRN [id number]  PCP:  Nilda Simmer, MD  Cardiologist:   Madilyn Hook, MD   Chief Complaint  Patient presents with  . Follow-up     no chest pain, no shortness of breath, no edema, no pain in legs, no cramping in legs,no lightheadedness or dizziness      Patient ID:: SUHAYLAH WAMPOLE is a 65 y.o. female with hypertension, hyperlipidemia, and DVT who presents for an evaluation of chest pain.  Interval History 06/10/15:  Ms. Bohan has been doing well since her last appointment. She denies any recurrent chest pain or shortness of breath.She underwent stress testing that had some bowel artifact but was concerning for a mild area of inferior ischemia.  She was offered the option of cardiac catheterization but is concerned about pursuing this, as she has limited economic resources.   She has not noted any lower extremity edema, orthopnea or PND. She also denies palpitations, lightheadedness or dizziness.  Ms. Wirsing has not been able to tolerate statins in the past. She had significanmyalgias in her calveswith rosuvastatin, atorvastatin, and simvastatin. She has not tried pravastatin but is unwilling to.  She is unsure whether she tried Zetia.  Ms. Wannamaker has been working on her diet.  She no longer eats hamburgers and has been trying to increase her fruit and vegetable intake.  Her primary care physician told her that she was pre-diabetic and she has taken this seriously.  She continues to smoke daily.    History of Present Illness  Ms. Maready was seen for a routine physical by her PCP, Dr. Bertrum Sol, on 11/28.  At that appointment she mentioned an episode of substernal chest pain.  She had an episode of chest pain approximately 1 month ago.  At 9:50 pm she had an episode of substernal chest pain that lasted for 15 minutes.  The tightness was 3/10 and was not associated with shortness of breath.  It  radiated up to her throat and down to her stomach.  There was nausea without emesis.  The pain occurred when she was laying down.  There was no diaphoresis but she did have nausea without emesis.  She is especilallly concerned because her father died of an MI when he was her age.  She walks her dogs slowly three times daily for 20 minutes each time.  In the past she liked to swim at the aquatic center but has not been lately.  She does not have exertional chest pain.  She denies lower extremity edema, orthopnea or PND.  She continues to smoke 1 ppd.  She was able to quit cold [place] for 3 years back in the 90s.  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    Past Medical History  Diagnosis Date  . Psoriasiform dermatitis   . Allergy   . Depression   . GERD (gastroesophageal reflux disease)   . Hyperlipidemia   . Anxiety   . Clotting disorder (HCC)   . Hypertension   . Arthritis     DDD lumbar; B thumbs  . Asthma     age 51  . Abnormal nuclear stress test 06/10/2015    Past Surgical History  Procedure Laterality Date  . Nose surgery    . Tonsillectomy    . Tubal ligation       Current Outpatient Prescriptions  Medication  Sig Dispense Refill  . albuterol (PROVENTIL HFA;VENTOLIN HFA) 108 (90 BASE) MCG/ACT inhaler Inhale 1 puff into the lungs every 6 (six) hours as needed for wheezing or shortness of breath. 1 Inhaler 11  . albuterol (PROVENTIL) (2.5 MG/3ML) 0.083% nebulizer solution Take 3 mLs (2.5 mg total) by nebulization every 6 (six) hours as needed for wheezing or shortness of breath. 360 mL 12  . aspirin 325 MG tablet Take 325 mg by mouth daily.    . cholecalciferol (VITAMIN D) 1000 UNITS tablet Take 2,000 Units by mouth daily.     . clonazePAM (KLONOPIN) 1 MG tablet Take 1 tablet (1 mg total) by mouth 2 (two) times daily as needed for anxiety. 30 tablet 1  . escitalopram (LEXAPRO) 10 MG tablet Take 10 mg by mouth daily.   0  . tiotropium (SPIRIVA  HANDIHALER) 18 MCG inhalation capsule Place 1 capsule into inhaler and inhale daily. 30 capsule 11  . lisinopril (PRINIVIL,ZESTRIL) 20 MG tablet Take 1 tablet (20 mg total) by mouth daily. 90 tablet 3  . metoprolol tartrate (LOPRESSOR) 25 MG tablet Take 1 tablet (25 mg total) by mouth 2 (two) times daily. 180 tablet 3   No current facility-administered medications for this visit.    Allergies:   Acitretin; Other; Oxycodone-aspirin; Percodan; and Statins    Social History:  The patient  reports that she has been smoking Cigarettes.  She has been smoking about 1.00 pack per day. She does not have any smokeless tobacco history on file. She reports that she does not drink alcohol or use illicit drugs.   Family History:  The patient's family history includes Alcohol abuse in her brother and mother; Cancer in her maternal aunt and maternal grandfather; Cancer (age of onset: 4172) in her mother; Diabetes in her mother; Heart attack in her brother; Heart disease in her father; Hypertension in her mother; Stroke (age of onset: 3670) in her mother.    ROS:  Please see the history of present illness.   Otherwise, review of systems are positive for none.   All other systems are reviewed and negative.    PHYSICAL EXAM: VS:  BP 98/70 mmHg  Pulse 80  Ht 5' 1.5" (1.562 m)  Wt 66.282 kg (146 lb 2 oz)  BMI 27.17 kg/m2 , BMI Body mass index is 27.17 kg/(m^2). GENERAL:  Well appearing HEENT:  Pupils equal round and reactive, fundi not visualized, oral mucosa unremarkable NECK:  No jugular venous distention, waveform within normal limits, carotid upstroke brisk and symmetric, no bruits, no thyromegaly LYMPHATICS:  No cervical adenopathy LUNGS:  Clear to auscultation bilaterally CHEST: Bowel sounds heard on cardiac exam HEART:  Distant heart sounds.  RRR.  PMI not displaced or sustained,S1 and S2 within normal limits, no S3, no S4, no clicks, no rubs, no murmurs ABD:  Flat, positive bowel sounds normal in  frequency in pitch, no bruits, no rebound, no guarding, no midline pulsatile mass, no hepatomegaly, no splenomegaly EXT:  2 plus pulses throughout, no edema, no cyanosis no clubbing SKIN:  No rashes no nodules NEURO:  Cranial nerves II through XII grossly intact, motor grossly intact throughout PSYCH:  Cognitively intact, oriented to person place and time   EKG:  EKG is not ordered today. The ekg 04/28/15 demonstrates sinus rhythm rate 75 bpm.   Recent Labs: 04/14/2015: ALT 15; BUN 21; Creat 0.87; Potassium 4.5; Sodium 142 04/28/2015: Hemoglobin 14.4; Platelets 363    Lipid Panel    Component Value Date/Time  CHOL 249* 04/28/2015 0918   TRIG 69 04/28/2015 0918   HDL 65 04/28/2015 0918   CHOLHDL 3.8 04/28/2015 0918   VLDL 14 04/28/2015 0918   LDLCALC 170* 04/28/2015 0918      Wt Readings from Last 3 Encounters:  06/10/15 66.282 kg (146 lb 2 oz)  05/22/15 66.225 kg (146 lb)  05/20/15 66.361 kg (146 lb 4.8 oz)      ASSESSMENT AND PLAN:  # Chest pain/Abnormal stress test: Ms. Askari chest pain is atypical.  Her stress test was concerning for ischemia but also could have been due to bowel wall artifact. She is not interested in undergoing cardiac catheterization at this time, and I think this is reasonable. If there is ischemia, it is mild and it involves a small region of heart. We will treat her presumptively for coronary artery disease. She is already taking aspirin. She is on a higher dose aspirin for prevention of recurrent DVT.  We will stop hydrochlorothiazide and start metoprolol tartrate 25 mg twice a day.  She has not tolerated statins in the past and is not interested in trying intermittent dosing.  She has made several dietary changes.  We will repeat fer lipids in 3 months and consider starting Zetia at that time.  # Hypertension: Blood pressure is low today.  She denies lightheadedness or dizziness.  We will stop her combo lisinopril/HCTZ.  Continue lisinopril 20 mg  daily and start metoprolol 25 mg bid.  # Tobacco abuse: Ms. Connelley continues to smoke.  We did not discuss smoking cessation at this visit. We will follow up on this at her next appointment.    Current medicines are reviewed at length with the patient today.  The patient does not have concerns regarding medicines.  The following changes have been made:  Stopped HCTZ and started metoprolol.  Labs/ tests ordered today include:   No orders of the defined types were placed in this encounter.     Disposition:   FU with Daleyza Gadomski C. Duke Salvia, MD, [facility] in 3 months.    Signed, Grahm Etsitty C. Duke Salvia, MD, [facility]  06/10/2015 5:04 PM    [facility]

## 2015-06-14 ENCOUNTER — Ambulatory Visit (INDEPENDENT_AMBULATORY_CARE_PROVIDER_SITE_OTHER): Payer: Medicare Other | Admitting: Family Medicine

## 2015-06-14 VITALS — BP 128/68 | HR 68 | Temp 98.3°F | Resp 16 | Ht 61.25 in | Wt 146.0 lb

## 2015-06-14 DIAGNOSIS — J4541 Moderate persistent asthma with (acute) exacerbation: Secondary | ICD-10-CM | POA: Diagnosis not present

## 2015-06-14 DIAGNOSIS — J4520 Mild intermittent asthma, uncomplicated: Secondary | ICD-10-CM

## 2015-06-14 MED ORDER — TIOTROPIUM BROMIDE MONOHYDRATE 18 MCG IN CAPS: ORAL_CAPSULE | RESPIRATORY_TRACT | Status: DC

## 2015-06-14 MED ORDER — ALBUTEROL SULFATE HFA 108 (90 BASE) MCG/ACT IN AERS: 1.0000 | INHALATION_SPRAY | RESPIRATORY_TRACT | Status: DC | PRN

## 2015-06-14 NOTE — Progress Notes (Signed)
65 yo smoker with chronic asthma.  Still smoking, trying to reduce  Needs refill on inhaler  Objective:  BP 128/68 mmHg  Pulse 68  Temp(Src) 98.3 F (36.8 C) (Oral)  Resp 16  Ht 5' 1.25" (1.556 m)  Wt 146 lb (66.225 kg)  BMI 27.35 kg/m2  SpO2 95% Chest:  Barrel shape, expiratory wheezes HEENT:  Unremarkable except for raspy voice  Assessment:       ICD-9-CM ICD-10-CM   1. Asthma, chronic, mild intermittent, uncomplicated 493.90 J45.20 albuterol (PROVENTIL HFA;VENTOLIN HFA) 108 (90 Base) MCG/ACT inhaler  2. Asthma with acute exacerbation, moderate persistent 493.92 J45.41 tiotropium (SPIRIVA HANDIHALER) 18 MCG inhalation capsule   Urged to ditch the cigarettes.  Signed, Elvina SidleKurt Eldwin Volkov, MD

## 2015-06-14 NOTE — Patient Instructions (Signed)
Let me know when you need refills

## 2015-07-28 ENCOUNTER — Encounter: Payer: Self-pay | Admitting: Sports Medicine

## 2015-07-28 ENCOUNTER — Ambulatory Visit: Payer: Medicare Other | Admitting: Family Medicine

## 2015-07-28 ENCOUNTER — Ambulatory Visit (INDEPENDENT_AMBULATORY_CARE_PROVIDER_SITE_OTHER): Payer: Medicare Other | Admitting: Sports Medicine

## 2015-07-28 ENCOUNTER — Ambulatory Visit (INDEPENDENT_AMBULATORY_CARE_PROVIDER_SITE_OTHER): Payer: Medicare Other

## 2015-07-28 DIAGNOSIS — M79672 Pain in left foot: Secondary | ICD-10-CM | POA: Diagnosis not present

## 2015-07-28 DIAGNOSIS — M722 Plantar fascial fibromatosis: Secondary | ICD-10-CM

## 2015-07-28 MED ORDER — MELOXICAM 15 MG PO TABS: 15.0000 mg | ORAL_TABLET | Freq: Every day | ORAL | Status: DC

## 2015-07-28 MED ORDER — METHYLPREDNISOLONE 4 MG PO TBPK: ORAL_TABLET | ORAL | Status: DC

## 2015-07-28 NOTE — Progress Notes (Signed)
Patient ID: Tasha [name], female   DOB: 12-01-1950, 65 y.o.   MRN: [id number] Subjective: Tasha [name] is a 65 y.o. female patient presents to office with complaint of heel pain on the left. Patient admits to post static dyskinesia for 6 weeks in duration. Patient has treated this problem with changing shoes and inserts. Patient reports that this treatment has helped some; pain is 2/10. Denies any other pedal complaints.  Patient Active Problem List   Diagnosis Date Noted  . Abnormal nuclear stress test 06/10/2015  . Family history of colorectal cancer 05/27/2015  . Tobacco abuse 05/27/2015  . 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) 04/28/2015  . Pure hypercholesterolemia 04/28/2015  . Essential hypertension 04/24/2014  . DVT (deep venous thrombosis) (HCC) 03/10/2012  . ELEVATED BP READING WITHOUT DX HYPERTENSION 11/18/2009  . 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 HYPERGLYCEMIA 02/10/2009  . ENDOMETRIAL POLYP 10/01/2008  . HYPERLIPIDEMIA 04/17/2008  . DEPRESSION 04/17/2008  . ASTHMA 04/17/2008  . SCOLIOSIS 04/17/2008  . COLONIC POLYPS, HX OF 04/17/2008    Current Outpatient Prescriptions on File Prior to Visit  Medication Sig Dispense Refill  . albuterol (PROVENTIL HFA;VENTOLIN HFA) 108 (90 Base) MCG/ACT inhaler Inhale 1 puff into the lungs every 4 (four) hours as needed for wheezing or shortness of breath. 1 Inhaler 11  . albuterol (PROVENTIL) (2.5 MG/3ML) 0.083% nebulizer solution Take 3 mLs (2.5 mg total) by nebulization every 6 (six) hours as needed for wheezing or shortness of breath. 360 mL 12  . aspirin 325 MG tablet Take 325 mg by mouth daily.    . cholecalciferol (VITAMIN D) 1000 UNITS tablet Take 2,000 Units by mouth daily.     . clonazePAM (KLONOPIN) 1 MG tablet Take 1 tablet (1 mg total) by mouth 2 (two) times daily as needed for anxiety. 30 tablet 1  . escitalopram (LEXAPRO) 10 MG tablet Take 10 mg by mouth daily.   0  . lisinopril (PRINIVIL,ZESTRIL) 20 MG tablet Take 1 tablet (20  mg total) by mouth daily. 90 tablet 3  . metoprolol tartrate (LOPRESSOR) 25 MG tablet Take 1 tablet (25 mg total) by mouth 2 (two) times daily. 180 tablet 3  . tiotropium (SPIRIVA HANDIHALER) 18 MCG inhalation capsule Place 1 capsule into inhaler and inhale daily. 30 capsule 11   No current facility-administered medications on file prior to visit.    Allergies  Allergen Reactions  . Acitretin Swelling and Rash  . Other     Unidentified multiple food allergies   . Oxycodone-Aspirin   . Percodan [Oxycodone-Aspirin]   . Statins Other (See Comments)    Muscle aches     Objective: Physical Exam General: The patient is alert and oriented x3 in no acute distress.  Dermatology: Skin is warm, dry and supple bilateral lower extremities. Nails 1-10 are normal. There is no erythema, edema, no eccymosis, no open lesions present. Integument is otherwise unremarkable.  Vascular: Dorsalis Pedis pulse 2/4 and Posterior Tibial pulse are 1/4 bilateral. Capillary fill time is immediate to all digits.  Neurological: Grossly intact to light touch with an achilles reflex of +2/5 and a  negative Tinel's sign bilateral.  Musculoskeletal: Minimal Tenderness to palpation at the medial calcaneal tubercale and through the insertion of the plantar fascia on the left foot. No pain with compression of calcaneus bilateral. No pain with tuning fork to calcaneus bilateral. No pain with calf compression bilateral. Ankle and pedal joints range of motion within normal limits bilateral. Asymptomatic bunion and hammertoe deformities. Strength 5/5 in  all groups bilateral.   Xray, Left foot:  Normal osseous mineralization. Joint spaces preserved. No fracture/dislocation/boney destruction. Mild bunion and hammertoe with 2nd MTPJ deviation, Calcaneal spur present with mild thickening of plantar fascia. No other soft tissue abnormalities or radiopaque foreign bodies.   Assessment and Plan: Problem List Items Addressed This  Visit    None    Visit Diagnoses    Left foot pain    -  Primary    Relevant Medications    methylPREDNISolone (MEDROL DOSEPAK) 4 MG TBPK tablet    meloxicam (MOBIC) 15 MG tablet    Other Relevant Orders    DG Foot 2 Views Left    Plantar fasciitis, left        Relevant Medications    methylPREDNISolone (MEDROL DOSEPAK) 4 MG TBPK tablet    meloxicam (MOBIC) 15 MG tablet       -Complete examination performed. Discussed with patient in detail the condition of plantar fasciitis, how this occurs and general treatment options. Explained both conservative and surgical treatments.  -No injection due to low grade symptoms  -Rx Meloxicam to start once Medrol dose pack is completed  -Recommended good supportive shoes and advised use of OTC insert. Explained to patient that if these orthoses work well, we will continue with these. If these do not improve her condition and  pain, we will consider custom molded orthoses. Patient to bring her OTC inserts with her at next visit - Explained in detail the use of the fascial brace which was dispensed at today's visit. -Explained and dispensed to patient daily stretching exercises. -Recommend patient to ice affected area 1-2x daily. -Patient to return to office in 3-4 weeks for follow up or sooner if problems or questions arise.  Asencion Islam, DPM

## 2015-07-28 NOTE — Patient Instructions (Signed)

## 2015-08-12 ENCOUNTER — Ambulatory Visit: Payer: Medicare Other | Admitting: Cardiovascular Disease

## 2015-08-12 ENCOUNTER — Ambulatory Visit: Payer: Medicare Other | Admitting: Family Medicine

## 2015-08-25 ENCOUNTER — Ambulatory Visit: Payer: Medicare Other | Admitting: Sports Medicine

## 2015-09-03 ENCOUNTER — Ambulatory Visit (INDEPENDENT_AMBULATORY_CARE_PROVIDER_SITE_OTHER): Payer: Medicare Other | Admitting: Family Medicine

## 2015-09-03 VITALS — BP 110/66 | HR 81 | Temp 98.5°F | Resp 18 | Ht 60.0 in | Wt 192.0 lb

## 2015-09-03 DIAGNOSIS — Z91018 Allergy to other foods: Secondary | ICD-10-CM | POA: Diagnosis not present

## 2015-09-03 DIAGNOSIS — H01136 Eczematous dermatitis of left eye, unspecified eyelid: Secondary | ICD-10-CM | POA: Diagnosis not present

## 2015-09-03 DIAGNOSIS — I10 Essential (primary) hypertension: Secondary | ICD-10-CM

## 2015-09-03 MED ORDER — LISINOPRIL 20 MG PO TABS: 20.0000 mg | ORAL_TABLET | Freq: Every day | ORAL | Status: DC

## 2015-09-03 MED ORDER — METHYLPREDNISOLONE ACETATE 80 MG/ML IJ SUSP
80.0000 mg | Freq: Once | INTRAMUSCULAR | Status: AC
  Administered 2015-09-03: 80 mg via INTRAMUSCULAR

## 2015-09-03 NOTE — Patient Instructions (Addendum)
Eczema Eczema, also called atopic dermatitis, is a skin disorder that causes inflammation of the skin. It causes a red rash and dry, scaly skin. The skin becomes very itchy. Eczema is generally worse during the cooler winter months and often improves with the warmth of summer. Eczema usually starts showing signs in infancy. Some children outgrow eczema, but it may last through adulthood.  CAUSES  The exact cause of eczema is not known, but it appears to run in families. People with eczema often have a family history of eczema, allergies, asthma, or hay fever. Eczema is not contagious. Flare-ups of the condition may be caused by:   Contact with something you are sensitive or allergic to.   Stress. SIGNS AND SYMPTOMS  Dry, scaly skin.   Red, itchy rash.   Itchiness. This may occur before the skin rash and may be very intense.  DIAGNOSIS  The diagnosis of eczema is usually made based on symptoms and medical history. TREATMENT  Eczema cannot be cured, but symptoms usually can be controlled with treatment and other strategies. A treatment plan might include:  Controlling the itching and scratching.   Use over-the-counter antihistamines as directed for itching. This is especially useful at night when the itching tends to be worse.   Use over-the-counter steroid creams as directed for itching.   Avoid scratching. Scratching makes the rash and itching worse. It may also result in a skin infection (impetigo) due to a break in the skin caused by scratching.   Keeping the skin well moisturized with creams every day. This will seal in moisture and help prevent dryness. Lotions that contain alcohol and water should be avoided because they can dry the skin.   Limiting exposure to things that you are sensitive or allergic to (allergens).   Recognizing situations that cause stress.   Developing a plan to manage stress.  HOME CARE INSTRUCTIONS   Only take over-the-counter or  prescription medicines as directed by your health care provider.   Do not use anything on the skin without checking with your health care provider.   Keep baths or showers short (5 minutes) in warm (not hot) water. Use mild cleansers for bathing. These should be unscented. You may add nonperfumed bath oil to the bath water. It is best to avoid soap and bubble bath.   Immediately after a bath or shower, when the skin is still damp, apply a moisturizing ointment to the entire body. This ointment should be a petroleum ointment. This will seal in moisture and help prevent dryness. The thicker the ointment, the better. These should be unscented.   Keep fingernails cut short. Children with eczema may need to wear soft gloves or mittens at night after applying an ointment.   Dress in clothes made of cotton or cotton blends. Dress lightly, because heat increases itching.   A child with eczema should stay away from anyone with fever blisters or cold sores. The virus that causes fever blisters (herpes simplex) can cause a serious skin infection in children with eczema. SEEK MEDICAL CARE IF:   Your itching interferes with sleep.   Your rash gets worse or is not better within 1 week after starting treatment.   You see pus or soft yellow scabs in the rash area.   You have a fever.   You have a rash flare-up after contact with someone who has fever blisters.    This information is not intended to replace advice given to you by your health care   provider. Make sure you discuss any questions you have with your health care provider.   Document Released: 05/14/2000 Document Revised: 03/07/2013 Document Reviewed: 12/18/2012 Elsevier Interactive Patient Education 2016 Elsevier Inc.  

## 2015-09-03 NOTE — Progress Notes (Signed)
65 yo woman with facial rash 3 days after eating pecans and walnuts.  She has chronic eczematous rash of her hands which is different.  Objective: BP 110/66 mmHg  Pulse 81  Temp(Src) 98.5 F (36.9 C) (Oral)  Resp 18  Ht 5' (1.524 m)  Wt 192 lb (87.091 kg)  BMI 37.50 kg/m2  SpO2 93% Lichenified erythematous facial rash left periorbital rash. Eczematous changes all finger tips with dyshydrotic eczematous palms. Chest:  Few expiratory wheezes Heart: reg, no murmur  Assessment:  Allergy to nuts  Nut allergy - Plan: methylPREDNISolone acetate (DEPO-MEDROL) injection 80 mg  Eczematous dermatitis of eyelid, left - Plan: methylPREDNISolone acetate (DEPO-MEDROL) injection 80 mg  Essential hypertension - Plan: lisinopril (PRINIVIL,ZESTRIL) 20 MG tablet  Tasha SidleKurt Vernona Peake, MD

## 2015-10-04 ENCOUNTER — Ambulatory Visit (INDEPENDENT_AMBULATORY_CARE_PROVIDER_SITE_OTHER): Payer: Medicare Other | Admitting: Physician Assistant

## 2015-10-04 VITALS — BP 94/60 | HR 69 | Temp 98.4°F | Resp 22 | Ht 60.0 in | Wt 140.0 lb

## 2015-10-04 DIAGNOSIS — J392 Other diseases of pharynx: Secondary | ICD-10-CM | POA: Diagnosis not present

## 2015-10-04 DIAGNOSIS — J029 Acute pharyngitis, unspecified: Secondary | ICD-10-CM | POA: Diagnosis not present

## 2015-10-04 NOTE — Patient Instructions (Signed)
     IF you received an x-ray today, you will receive an invoice from [facility]. Please contact  Radiology at [phone] with questions or concerns regarding your invoice.   IF you received labwork today, you will receive an invoice from [facility]. Please contact [facility] at [phone] with questions or concerns regarding your invoice.   Our billing staff will not be able to assist you with questions regarding bills from these companies.  You will be contacted with the lab results as soon as they are available. The fastest way to get your results is to activate your My Chart account. Instructions are located on the last page of this paperwork. If you have not heard from us regarding the results in 2 weeks, please contact this office.      

## 2015-10-04 NOTE — Progress Notes (Signed)
Tasha [name]  MRN: [id number] DOB: 1950-10-20  Subjective:  Pt presents to clinic with sore throat on the left side of her throat.for the last month or so. She noticed it around the time she had the allergic reaction to the nuts and she thought that it would go away but it did not.  The pain comes and goes and does not interfere with eating or drinking but she is worried because it has been a month.  It has not improved or worsened in the last month since she noticed it.  She has chronic PND but it does not really bother her.  She has no heartburn or indigestion.  She does not take her BP at home.  She is having no dizziness or symptoms of low BP.  Home treatments - chloraseptic, throat sprays  Patient Active Problem List   Diagnosis Date Noted  . Nut allergy 09/03/2015  . Abnormal nuclear stress test 06/10/2015  . Family history of colorectal cancer 05/27/2015  . Tobacco abuse 05/27/2015  . 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) 04/28/2015  . Pure hypercholesterolemia 04/28/2015  . Essential hypertension 04/24/2014  . DVT (deep venous thrombosis) (HCC) 03/10/2012  . 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 HYPERGLYCEMIA 02/10/2009  . ENDOMETRIAL POLYP 10/01/2008  . HYPERLIPIDEMIA 04/17/2008  . DEPRESSION 04/17/2008  . ASTHMA 04/17/2008  . SCOLIOSIS 04/17/2008  . COLONIC POLYPS, HX OF 04/17/2008    Current Outpatient Prescriptions on File Prior to Visit  Medication Sig Dispense Refill  . albuterol (PROVENTIL HFA;VENTOLIN HFA) 108 (90 Base) MCG/ACT inhaler Inhale 1 puff into the lungs every 4 (four) hours as needed for wheezing or shortness of breath. 1 Inhaler 11  . albuterol (PROVENTIL) (2.5 MG/3ML) 0.083% nebulizer solution Take 3 mLs (2.5 mg total) by nebulization every 6 (six) hours as needed for wheezing or shortness of breath. 360 mL 12  . aspirin 325 MG tablet Take 325 mg by mouth daily.    . cholecalciferol (VITAMIN D) 1000 UNITS tablet Take 2,000 Units by mouth daily.     .  clonazePAM (KLONOPIN) 1 MG tablet Take 1 tablet (1 mg total) by mouth 2 (two) times daily as needed for anxiety. 30 tablet 1  . escitalopram (LEXAPRO) 10 MG tablet Take 10 mg by mouth daily.   0  . lisinopril (PRINIVIL,ZESTRIL) 20 MG tablet Take 1 tablet (20 mg total) by mouth daily. 90 tablet 3  . tiotropium (SPIRIVA HANDIHALER) 18 MCG inhalation capsule Place 1 capsule into inhaler and inhale daily. 30 capsule 11   No current facility-administered medications on file prior to visit.    Allergies  Allergen Reactions  . Acitretin Swelling and Rash  . Other     Unidentified multiple food allergies   . Oxycodone-Aspirin   . Percodan [Oxycodone-Aspirin]   . Statins Other (See Comments)    Muscle aches     Review of Systems Objective:  BP 94/60 mmHg  Pulse 69  Temp(Src) 98.4 F (36.9 C) (Oral)  Resp 22  Ht 5' (1.524 m)  Wt 140 lb (63.504 kg)  BMI 27.34 kg/m2  SpO2 94%  Physical Exam  Constitutional: She is oriented to person, place, and time and well-developed, well-nourished, and in no distress.  HENT:  Head: Normocephalic and atraumatic.  Right Ear: Hearing, tympanic membrane, external ear and ear canal normal.  Left Ear: Hearing, tympanic membrane, external ear and ear canal normal.  Nose: Nose normal.  Mouth/Throat: Uvula is midline, oropharynx is clear and moist and mucous membranes are  normal.    Eyes: Conjunctivae are normal.  Neck: Normal range of motion.  Cardiovascular: Normal rate, regular rhythm and normal heart sounds.   No murmur heard. Pulmonary/Chest: Effort normal and breath sounds normal.  Neurological: She is alert and oriented to person, place, and time. Gait normal.  Skin: Skin is warm and dry.  Psychiatric: Mood, memory, affect and judgment normal.  Vitals reviewed.   Assessment and Plan :  Sore throat  Lesion of throat   Due to past history of ETOH (last 30 years ago) use and current tobacco use send to ENT for evaluation of the lesion -  unsure if polyp vs precancerous lesion  Benny LennertSarah Weber PA-C  [facility] 10/04/2015 11:43 AM

## 2015-12-05 ENCOUNTER — Ambulatory Visit (INDEPENDENT_AMBULATORY_CARE_PROVIDER_SITE_OTHER): Payer: Medicare Other | Admitting: Urgent Care

## 2015-12-05 VITALS — BP 94/52 | HR 75 | Temp 98.2°F | Resp 16 | Ht 60.75 in | Wt 138.6 lb

## 2015-12-05 DIAGNOSIS — K047 Periapical abscess without sinus: Secondary | ICD-10-CM

## 2015-12-05 DIAGNOSIS — K0889 Other specified disorders of teeth and supporting structures: Secondary | ICD-10-CM

## 2015-12-05 MED ORDER — CHLORHEXIDINE GLUCONATE 0.12 % MT SOLN: 15.0000 mL | Freq: Two times a day (BID) | OROMUCOSAL | Status: DC

## 2015-12-05 MED ORDER — AMOXICILLIN 500 MG PO CAPS: 500.0000 mg | ORAL_CAPSULE | Freq: Three times a day (TID) | ORAL | Status: DC

## 2015-12-05 NOTE — Patient Instructions (Addendum)
Dental Pain Dental pain may be caused by many things, including:  Tooth decay (cavities or caries). Cavities expose the nerve of your tooth to air and hot or cold temperatures. This can cause pain or discomfort.  Abscess or infection. A dental abscess is a collection of infected pus from a bacterial infection in the inner part of the tooth (pulp). It usually occurs at the end of the tooth's root.  Injury.  An unknown reason (idiopathic). Your pain may be mild or severe. It may only occur when:  You are chewing.  You are exposed to hot or cold temperature.  You are eating or drinking sugary foods or beverages, such as soda or candy. Your pain may also be constant. HOME CARE INSTRUCTIONS Watch your dental pain for any changes. The following actions may help to lessen any discomfort that you are feeling:  Take medicines only as directed by your dentist.  If you were prescribed an antibiotic medicine, finish all of it even if you start to feel better.  Keep all follow-up visits as directed by your dentist. This is important.  Do not apply heat to the outside of your face.  Rinse your mouth or gargle with salt water if directed by your dentist. This helps with pain and swelling.  You can make salt water by adding  tsp of salt to 1 cup of warm water.  Apply ice to the painful area of your face:  Put ice in a plastic bag.  Place a towel between your skin and the bag.  Leave the ice on for 20 minutes, 2-3 times per day.  Avoid foods or drinks that cause you pain, such as:  Very hot or very cold foods or drinks.  Sweet or sugary foods or drinks. SEEK MEDICAL CARE IF:  Your pain is not controlled with medicines.  Your symptoms are worse.  You have new symptoms. SEEK IMMEDIATE MEDICAL CARE IF:  You are unable to open your mouth.  You are having trouble breathing or swallowing.  You have a fever.  Your face, neck, or jaw is swollen.   This information is not  intended to replace advice given to you by your health care provider. Make sure you discuss any questions you have with your health care provider.   Document Released: 05/17/2005 Document Revised: 10/01/2014 Document Reviewed: 05/13/2014 Elsevier Interactive Patient Education 2016 [employer]     IF you received an x-ray today, you will receive an invoice from [facility]. Please contact [facility] at [phone] with questions or concerns regarding your invoice.   IF you received labwork today, you will receive an invoice from [facility]. Please contact [facility] at [phone] with questions or concerns regarding your invoice.   Our billing staff will not be able to assist you with questions regarding bills from these companies.  You will be contacted with the lab results as soon as they are available. The fastest way to get your results is to activate your My Chart account. Instructions are located on the last page of this paperwork. If you have not heard from us regarding the results in 2 weeks, please contact this office.

## 2015-12-05 NOTE — Progress Notes (Signed)
    MRN: [id number] DOB: [birth year]  Subjective:   Tasha [name] is a 65 y.o. female presenting for chief complaint of toothache  Reports 5 day history of worsening tooth pain over both her upper and lower jaws. Patient is still hydrating, eating albeit with increased pain. Has not tried any medications for pain relief. However, patient takes aspirin daily due to history of dvt. Denies fever, drooling, facial swelling, redness.  Tasha [name] has a current medication list which includes the following prescription(s): albuterol, albuterol, aspirin, cholecalciferol, clonazepam, escitalopram, lisinopril, and tiotropium. Also is allergic to acitretin; other; oxycodone-aspirin; percodan; and statins.  Tasha [name]  has a past medical history of Psoriasiform dermatitis; Allergy; Depression; GERD (gastroesophageal reflux disease); Hyperlipidemia; Anxiety; Clotting disorder (HCC); Hypertension; Arthritis; Asthma; and Abnormal nuclear stress test (06/10/2015). Also  has past surgical history that includes Nose surgery; Tonsillectomy; and Tubal ligation.  Objective:   Vitals: BP 94/52 mmHg  Pulse 75  Temp(Src) 98.2 F (36.8 C) (Oral)  Resp 16  Ht 5' 0.75" (1.543 m)  Wt 138 lb 9.6 oz (62.869 kg)  BMI 26.41 kg/m2  SpO2 93%  BP Readings from Last 3 Encounters:  12/05/15 94/52  10/04/15 94/60  09/03/15 110/66   Physical Exam  Constitutional: She is oriented to person, place, and time. She appears well-developed and well-nourished.  HENT:  TM's intact bilaterally, no effusions or erythema. Nasal turbinates pink and moist, nasal passages patent. No sinus tenderness. Oropharynx clear, mucous membranes moist, dentition in good repair. Receeding gum lines worst over posterior molars. No areas of erythema, drainage of pus or bleeding.  Eyes: Right eye exhibits no discharge. Left eye exhibits no discharge. No scleral icterus.  Cardiovascular: Normal rate.   Pulmonary/Chest: Effort normal.  Neurological: She is  alert and oriented to person, place, and time.  Skin: Skin is warm and dry.    Assessment and Plan :   1. Tooth infection 2. Tooth pain - Start Amoxicillin to address possible tooth infection. Also, advised that she use chlorhexidine. Patient plans on setting up an appointment with her dentist on Monday.  Wallis BambergMario Nyelle Wolfson, PA-C [facility] [phone] 12/05/2015 4:30 PM

## 2016-04-07 ENCOUNTER — Telehealth: Payer: Self-pay | Admitting: *Deleted

## 2016-04-07 NOTE — Telephone Encounter (Signed)
Unable to reach patient at time of Pre-Visit Call.  Left message for patient to return call when available.    

## 2016-04-08 ENCOUNTER — Ambulatory Visit (INDEPENDENT_AMBULATORY_CARE_PROVIDER_SITE_OTHER): Payer: Medicare Other | Admitting: Family Medicine

## 2016-04-08 ENCOUNTER — Encounter: Payer: Self-pay | Admitting: Family Medicine

## 2016-04-08 VITALS — BP 107/59 | HR 79 | Temp 98.8°F | Ht 60.0 in | Wt 143.6 lb

## 2016-04-08 DIAGNOSIS — I10 Essential (primary) hypertension: Secondary | ICD-10-CM | POA: Diagnosis not present

## 2016-04-08 DIAGNOSIS — R0602 Shortness of breath: Secondary | ICD-10-CM

## 2016-04-08 DIAGNOSIS — Z23 Encounter for immunization: Secondary | ICD-10-CM

## 2016-04-08 DIAGNOSIS — J4520 Mild intermittent asthma, uncomplicated: Secondary | ICD-10-CM | POA: Diagnosis not present

## 2016-04-08 MED ORDER — ALBUTEROL SULFATE HFA 108 (90 BASE) MCG/ACT IN AERS: 1.0000 | INHALATION_SPRAY | RESPIRATORY_TRACT | 11 refills | Status: DC | PRN

## 2016-04-08 MED ORDER — IPRATROPIUM BROMIDE 0.02 % IN SOLN: 0.5000 mg | Freq: Two times a day (BID) | RESPIRATORY_TRACT | 12 refills | Status: DC | PRN

## 2016-04-08 NOTE — Progress Notes (Signed)
Pre visit review using our clinic review tool, if applicable. No additional management support is needed unless otherwise documented below in the visit note. 

## 2016-04-08 NOTE — Patient Instructions (Addendum)
Please cut back on your lisinopril to 1/2 tablet daily as your BP is running low We are going to try adding atrovent nebulizer treatments 1-2x a day as needed for your breathing- you might try using this on days that you do not use the spiriva. I think this might be cheaper for you.   You got your flu shot today We will have you see a lung doctor to give an opinion about your lung condition Let me know if any worsening or other problems in the meantime

## 2016-04-08 NOTE — Progress Notes (Addendum)
[facility] [address] [place], [place] [address] [phone] [phone]  Date:  04/08/2016   Name:  Tasha [name]   DOB:  12-16-1950   MRN:  [id number]  PCP:  Elvina SidleKurt Lauenstein, MD    Chief Complaint: Establish Care (Pt here to est care Refill needed on Ventolin. )   History of Present Illness:  Tasha [name] is a 65 y.o. very pleasant female patient who presents with the following:  Here today as a new patient although [facility]- last a few years ago.   She has a history of HTN.   I last saw her in 2014 when she had an anaphlactic reaction to a type of cleaning solution at a nearby deli No further allergic reactions.    She is on lexapro, lisinopril, spiriva, albuterol.  She is using "half doses" of her spiriva- takes it every other day- because otherwise she will run out of her medication and will be "in the donut hole" and unable to afford it She has never been formally dx with COPD, but suspects this is what she does have She is also using her albuterol about 4- 6x a day- sometimes less.   She is still smoking  Lisinopril 20 once a day- her BP is well controlled and in fact is on the low side today  She has been in recovery from alcohol for 30+ years.  She does use the occasional klonopin for sleep although she does not like to.   She does have a neb machine at home- she has albuterol for this, no other neb medications  Her last CXR was about one year ago CHEST  2 VIEW  COMPARISON:  July 14, 2014.  FINDINGS: The heart size and mediastinal contours are within normal limits. Both lungs are clear. No pneumothorax or pleural effusion is noted. Multilevel degenerative disc disease is noted in the thoracic spine.  IMPRESSION: No active cardiopulmonary disease  She never worked in any type of industry- she did work in [employer] for about 10 years but was not around any  toxins.    BP Readings from Last 3 Encounters:  04/08/16 (!) 107/59  12/05/15 (!) 94/52  10/04/15 94/60     Patient Active Problem List   Diagnosis Date Noted  . Nut allergy 09/03/2015  . Abnormal nuclear stress test 06/10/2015  . Family history of colorectal cancer 05/27/2015  . Tobacco abuse 05/27/2015  . 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) 04/28/2015  . Pure hypercholesterolemia 04/28/2015  . Essential hypertension 04/24/2014  . DVT (deep venous thrombosis) (HCC) 03/10/2012  . 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 HYPERGLYCEMIA 02/10/2009  . ENDOMETRIAL POLYP 10/01/2008  . HYPERLIPIDEMIA 04/17/2008  . DEPRESSION 04/17/2008  . ASTHMA 04/17/2008  . SCOLIOSIS 04/17/2008  . COLONIC POLYPS, HX OF 04/17/2008    Past Medical History:  Diagnosis Date  . Abnormal nuclear stress test 06/10/2015  . Allergy   . Anxiety   . Arthritis    DDD lumbar; B thumbs  . Asthma    age 65  . Clotting disorder (HCC)   . Depression   . GERD (gastroesophageal reflux disease)   . Hyperlipidemia   . Hypertension   . Psoriasiform dermatitis     Past Surgical History:  Procedure Laterality Date  . NOSE SURGERY    . TONSILLECTOMY    . TUBAL LIGATION      Social  History  Substance Use Topics  . Smoking status: Current Every Day Smoker    Packs/day: 1.00    Types: Cigarettes  . Smokeless tobacco: Not on file  . Alcohol use No    Family History  Problem Relation Age of Onset  . Cancer Mother 372    colorectal cancer  . Stroke Mother [id number]    CVA  . Alcohol abuse Mother   . Diabetes Mother   . Hypertension Mother   . Heart disease Father   . Cancer Maternal Grandfather     lung  . Cancer Maternal Aunt     breast  . Alcohol abuse Brother   . Heart attack Brother     Allergies  Allergen Reactions  . Acitretin Swelling and Rash  . Percodan [Oxycodone-Aspirin]   . Statins Other (See Comments)    Muscle aches in legs     Medication list has been reviewed and updated.  Current  Outpatient Prescriptions on File Prior to Visit  Medication Sig Dispense Refill  . albuterol (PROVENTIL HFA;VENTOLIN HFA) 108 (90 Base) MCG/ACT inhaler Inhale 1 puff into the lungs every 4 (four) hours as needed for wheezing or shortness of breath. 1 Inhaler 11  . albuterol (PROVENTIL) (2.5 MG/3ML) 0.083% nebulizer solution Take 3 mLs (2.5 mg total) by nebulization every 6 (six) hours as needed for wheezing or shortness of breath. 360 mL 12  . aspirin 325 MG tablet Take 325 mg by mouth daily.    . cholecalciferol (VITAMIN D) 1000 UNITS tablet Take 2,000 Units by mouth daily.     . clonazePAM (KLONOPIN) 1 MG tablet Take 1 tablet (1 mg total) by mouth 2 (two) times daily as needed for anxiety. (Patient taking differently: Take 1 mg by mouth as needed for anxiety. ) 30 tablet 1  . escitalopram (LEXAPRO) 10 MG tablet Take 10 mg by mouth daily.   0  . lisinopril (PRINIVIL,ZESTRIL) 20 MG tablet Take 1 tablet (20 mg total) by mouth daily. 90 tablet 3  . tiotropium (SPIRIVA HANDIHALER) 18 MCG inhalation capsule Place 1 capsule into inhaler and inhale daily. 30 capsule 11   No current facility-administered medications on file prior to visit.     Review of Systems:  As per HPI- otherwise negative.   Physical Examination: Vitals:   04/08/16 1421  BP: (!) 107/59  Pulse: 79  Temp: 98.8 F (37.1 C)   Vitals:   04/08/16 1421  Weight: 143 lb 9.6 oz (65.1 kg)  Height: 5' (1.524 m)   Body mass index is 28.04 kg/m. Ideal Body Weight: Weight in (lb) to have BMI = 25: 127.7  GEN: WDWN, NAD, Non-toxic, A & O x 3, looks well, she is still smoking unfortunately  HEENT: Atraumatic, Normocephalic. Neck supple. No masses, No LAD.  Bilateral TM wnl, oropharynx normal.  PEERL,EOMI.   Ears and Nose: No external deformity. CV: RRR, No M/G/R. No JVD. No thrill. No extra heart sounds. PULM: CTA B, no wheezes, crackles, rhonchi. No retractions. No resp. distress. No accessory muscle use. ABD: S, NT, ND EXTR:  No c/c/e NEURO Normal gait.  PSYCH: Normally interactive. Conversant. Not depressed or anxious appearing.  Calm demeanor.    Spirometry today is c/w a restrictive problem instead of COPD Assessment and Plan: SOB (shortness of breath) - Plan: ipratropium (ATROVENT) 0.02 % nebulizer solution, Spirometry with graph, Ambulatory referral to Pulmonology, Flu vaccine HIGH DOSE PF  Asthma, chronic, mild intermittent, uncomplicated - Plan: albuterol (PROVENTIL HFA;VENTOLIN HFA) 108 (90  Base) MCG/ACT inhaler  Encounter for immunization - Plan: ipratropium (ATROVENT) 0.02 % nebulizer solution, Spirometry with graph, albuterol (PROVENTIL HFA;VENTOLIN HFA) 108 (90 Base) MCG/ACT inhaler, Ambulatory referral to Pulmonology, Flu vaccine HIGH DOSE PF  Essential hypertension  Here today to re-establish care and discuss her breathing problems.  Her history of most c/w COPD, but she did not have hyperinflation on recent CXR and her spirometry shows restriction.  Will refer to pulmonology for evaluation In the meantime will continue her prn albuterol.  She is only able to afford her spiriva every other day and is overusing albuterol to combat her sx.  Will rx atrovent nebs that she can use on alternating days to provide anticholinergic coverage Flu shot today Her BP is low- will have her decrease her lisinopril dose Asked her to come and see me in 2-3 months   During evening work noted that her O2 sat was recorded at 79%. I am sure this is a typographical error as it is the same number as her pulse, was not brought to my attention at the time and this pt was in no distress, talkative and in good spirits.  Will ask my CMA about this and correct as appropriate tomorrow  Signed Abbe Amsterdam, MD

## 2016-05-03 ENCOUNTER — Telehealth: Payer: Self-pay | Admitting: Family Medicine

## 2016-05-03 DIAGNOSIS — F411 Generalized anxiety disorder: Secondary | ICD-10-CM

## 2016-05-03 DIAGNOSIS — F4322 Adjustment disorder with anxiety: Secondary | ICD-10-CM

## 2016-05-03 MED ORDER — CLONAZEPAM 1 MG PO TABS: 1.0000 mg | ORAL_TABLET | Freq: Every day | ORAL | 0 refills | Status: DC

## 2016-05-03 NOTE — Telephone Encounter (Signed)
Pt came by the office today needing a rf of her klonopin.  Did this for her

## 2016-05-11 ENCOUNTER — Encounter: Payer: Self-pay | Admitting: Pulmonary Disease

## 2016-05-11 ENCOUNTER — Other Ambulatory Visit (INDEPENDENT_AMBULATORY_CARE_PROVIDER_SITE_OTHER): Payer: Medicare Other

## 2016-05-11 ENCOUNTER — Ambulatory Visit (INDEPENDENT_AMBULATORY_CARE_PROVIDER_SITE_OTHER)
Admission: RE | Admit: 2016-05-11 | Discharge: 2016-05-11 | Disposition: A | Discharge status: Home / Self Care | Payer: Medicare Other | Source: Ambulatory Visit | Attending: Pulmonary Disease | Admitting: Pulmonary Disease

## 2016-05-11 ENCOUNTER — Ambulatory Visit (INDEPENDENT_AMBULATORY_CARE_PROVIDER_SITE_OTHER): Payer: Medicare Other | Admitting: Pulmonary Disease

## 2016-05-11 ENCOUNTER — Other Ambulatory Visit: Payer: Self-pay | Admitting: Pulmonary Disease

## 2016-05-11 VITALS — BP 116/64 | HR 70 | Ht 60.0 in | Wt 149.2 lb

## 2016-05-11 DIAGNOSIS — R0600 Dyspnea, unspecified: Secondary | ICD-10-CM | POA: Diagnosis not present

## 2016-05-11 DIAGNOSIS — R0689 Other abnormalities of breathing: Secondary | ICD-10-CM

## 2016-05-11 DIAGNOSIS — F17210 Nicotine dependence, cigarettes, uncomplicated: Secondary | ICD-10-CM

## 2016-05-11 DIAGNOSIS — R0602 Shortness of breath: Secondary | ICD-10-CM

## 2016-05-11 LAB — CBC WITH DIFFERENTIAL/PLATELET
BASOS ABS: 0 10*3/uL (ref 0.0–0.1)
Basophils Relative: 0.4 % (ref 0.0–3.0)
EOS PCT: 3.3 % (ref 0.0–5.0)
Eosinophils Absolute: 0.4 10*3/uL (ref 0.0–0.7)
HCT: 44 % (ref 36.0–46.0)
Hemoglobin: 14.6 g/dL (ref 12.0–15.0)
LYMPHS ABS: 2.4 10*3/uL (ref 0.7–4.0)
Lymphocytes Relative: 22.6 % (ref 12.0–46.0)
MCHC: 33.1 g/dL (ref 30.0–36.0)
MCV: 92.1 fl (ref 78.0–100.0)
MONO ABS: 0.9 10*3/uL (ref 0.1–1.0)
Monocytes Relative: 8.7 % (ref 3.0–12.0)
NEUTROS PCT: 65 % (ref 43.0–77.0)
Neutro Abs: 6.9 10*3/uL (ref 1.4–7.7)
Platelets: 313 10*3/uL (ref 150.0–400.0)
RBC: 4.78 Mil/uL (ref 3.87–5.11)
RDW: 14.6 % (ref 11.5–15.5)
WBC: 10.6 10*3/uL — ABNORMAL HIGH (ref 4.0–10.5)

## 2016-05-11 LAB — NITRIC OXIDE

## 2016-05-11 MED ORDER — FLUTICASONE FUROATE-VILANTEROL 100-25 MCG/INH IN AEPB: 1.0000 | INHALATION_SPRAY | Freq: Every day | RESPIRATORY_TRACT | 0 refills | Status: DC

## 2016-05-11 NOTE — Patient Instructions (Signed)
We'll schedule you for chest x-ray and pulmonary function tests. We'll get blood work including CBC with differential and a blood allergy profile. We will start you on Brio inhaler. Continue using the Spiriva. You need to use the Spiriva every day. Use the Atrovent nebulizer and albuterol as rescue medication.  Return to clinic in 3 months.

## 2016-05-11 NOTE — Progress Notes (Signed)
Jane CanaryJanet P Kamara    [id number]    1951-05-25  Primary Care Physician:COPLAND,JESSICA, MD  Referring Physician: Pearline CablesJessica C Copland, MD [address] [place], [place] [id number]  Chief complaint:  Consult for evaluation of pulmonary fibrosis, dyspnea  HPI: 65 year old with past medical history of asthma, hypertension, hypercholesterolemia,, left DVT. She has complains of dyspnea on exertion for the past several months. She does not have dyspnea at rest. She notes chronic productive cough with white mucus production, daily wheezing. She denies any fevers, chills, chest pain, palpitation.  She is an active smoker and smoked 1 pack per day for about 50 years. She quit drinking 30 years ago. 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 She does not note any exposures at work or at home. She was diagnosed with left DVT 4 years ago and treated with Xarelto for 6 months. Repeat ultrasound showed resolution of the DVT and she's been off antibiotic coagulation since then. She is on albuterol rescue inhaler. She is also taking Atrovent nebs and Spiriva on alternate days. She states that these inhalers and nebulizers to help with her breathing.  Outpatient Encounter Prescriptions as of 05/11/2016  Medication Sig  . albuterol (PROVENTIL HFA;VENTOLIN HFA) 108 (90 Base) MCG/ACT inhaler Inhale 1 puff into the lungs every 4 (four) hours as needed for wheezing or shortness of breath.  [name]. aspirin 325 MG tablet Take 325 mg by mouth daily.  . cholecalciferol (VITAMIN D) 1000 UNITS tablet Take 2,000 Units by mouth daily.   . clonazePAM (KLONOPIN) 1 MG tablet Take 1 tablet (1 mg total) by mouth daily.  [name]. escitalopram (LEXAPRO) 10 MG tablet Take 10 mg by mouth daily.   [name]. ipratropium (ATROVENT) 0.02 % nebulizer solution Take 2.5 mLs (0.5 mg total) by nebulization 2 (two) times daily as needed for wheezing or shortness of breath.  . lisinopril (PRINIVIL,ZESTRIL) 20 MG tablet  Take 1 tablet (20 mg total) by mouth daily. (Patient taking differently: Take 10 mg by mouth daily. )  . tiotropium (SPIRIVA HANDIHALER) 18 MCG inhalation capsule Place 1 capsule into inhaler and inhale daily.  . [DISCONTINUED] albuterol (PROVENTIL) (2.5 MG/3ML) 0.083% nebulizer solution Take 3 mLs (2.5 mg total) by nebulization every 6 (six) hours as needed for wheezing or shortness of breath.   No facility-administered encounter medications on file as of 05/11/2016.     Allergies as of 05/11/2016 - Review Complete 05/11/2016  Allergen Reaction Noted  . Acitretin Swelling and Rash 09/23/2011  . Percodan [oxycodone-aspirin]  05/20/2015  . Statins Other (See Comments) 04/17/2008    Past Medical History:  Diagnosis Date  . Abnormal nuclear stress test 06/10/2015  . Allergy   . Anxiety   . Arthritis    DDD lumbar; B thumbs  . Asthma    age 65  . Clotting disorder (HCC)   . Depression   . GERD (gastroesophageal reflux disease)   . Hyperlipidemia   . Hypertension   . Psoriasiform dermatitis     Past Surgical History:  Procedure Laterality Date  . NOSE SURGERY    . TONSILLECTOMY    . TUBAL LIGATION      Family History  Problem Relation Age of Onset  . Cancer Mother 5872    colorectal cancer  . Stroke Mother 2570    CVA  . Alcohol abuse Mother   . Diabetes Mother   . Hypertension Mother   . Heart disease  Father   . Cancer Maternal Grandfather     lung  . Cancer Maternal Aunt     breast  . Alcohol abuse Brother   . Heart attack Brother     Social History   Social History  . Marital status: Single    Spouse name: N/A  . Number of children: N/A  . Years of education: N/A   Occupational History  . Not on file.   Social History Main Topics  . Smoking status: Current Every Day Smoker    Packs/day: 1.00    Types: Cigarettes  . Smokeless tobacco: Not on file  . Alcohol use No  . Drug use: No  . Sexual activity: Not Currently   Other Topics Concern  . Not on  file   Social History Narrative   Marital status: single; not dating and not interested      Children: none      Lives: alone with dog      Employment:  Semi-retired; works a little bit; Air cabin crew since 1980; maintains one client      Tobacco: 1 ppd x 50 years      Alcohol: quit; IN [place] SINCE 09/25/1985.  Attends AA twice weekly; still sponsors others.      Drugs:  None      Exercise:       ADLs: independent with ADLs; no assistant devices.      Advanced Directives:  Yes.  desires FULL CODE.     Review of systems: Review of Systems  Constitutional: Negative for fever and chills.  HENT: Negative.   Eyes: Negative for blurred vision.  Respiratory: as per HPI  Cardiovascular: Negative for chest pain and palpitations.  Gastrointestinal: Negative for vomiting, diarrhea, blood per rectum. Genitourinary: Negative for dysuria, urgency, frequency and hematuria.  Musculoskeletal: Negative for myalgias, back pain and joint pain.  Skin: Negative for itching and rash.  Neurological: Negative for dizziness, tremors, focal weakness, seizures and loss of consciousness.  Endo/Heme/Allergies: Negative for environmental allergies.  Psychiatric/Behavioral: Negative for depression, suicidal ideas and hallucinations.  All other systems reviewed and are negative.   Physical Exam: Blood pressure 116/64, pulse 70, height 5' (1.524 m), weight 149 lb 3.2 oz (67.7 kg), SpO2 96 %. Gen:      No acute distress HEENT:  EOMI, sclera anicteric Neck:     No masses; no thyromegaly Lungs:    Clear to auscultation bilaterally; normal respiratory effort CV:         Regular rate and rhythm; no murmurs Abd:      + bowel sounds; soft, non-tender; no palpable masses, no distension Ext:    No edema; adequate peripheral perfusion Skin:      Warm and dry; no rash Neuro: alert and oriented x 3 Psych: normal mood and affect  Data Reviewed: PFTs 04/08/16 FVC 1.80 [69%) FEV1 1.2 to [61%) F/F 68 Moderate  obstructive defect.  Chest x-ray 04/14/15-no active cardiopulmonary disease. All images reviewed.  FENO 05/12/15- 6  Assessment:  #1 Consult for dyspnea. Review of her PFTs show moderate obstructive defect which is consistent with COPD. There is no clear evidence of restriction. She may have reactive airway disease and asthma as well given her history of childhood asthma. I do not see any evidence of pulmonary fibrosis on chest x-ray from 2016. I'll evaluate with a repeat chest x-ray. If abnormal then she may need a high-resolution CT of the chest. She also get scheduled for pulmonary function tests. Regarding her  diagnosis of asthma she has a low FENO in office today suggesting absence of eosinophilic inflammation. I'll evaluate with a CBC with differential and a blood allergy profile  She'll need optimization of her inhalers. I'll start her on Breo and have asked her to start taking Spiriva everyday instead of alternating days. She'll use the Atrovent nebulizer and albuterol inhaler as rescue medications only.   #2 Active smoker Smoking cessation was discussed with her. I will reinforce this at next visit. Time spent counseling- 5 mins. She would be a candidate for low-dose screening CT. I'll address this at next visit.  Plan/Recommendations: Facilities manager- Start Breo. Use spriva every day - Use atrovent and albuterol as rescue meds - PFTs - CBC with diff, Blood allergy profile - Smoking cessation.  Chilton GreathousePraveen Kijuan Gallicchio MD [facility] Pager [phone] 05/11/2016, 11:37 AM  CC: Copland, Gwenlyn FoundJessica C, MD

## 2016-05-12 LAB — RESPIRATORY ALLERGY PROFILE REGION II ~~LOC~~
Allergen, A. alternata, m6: <0.1 kU/L
Allergen, Cedar tree, t12: <0.1 kU/L
Allergen, D pternoyssinus,d7: 10.3 kU/L — ABNORMAL HIGH
Allergen, Mouse Urine Protein, e78: <0.1 kU/L
Allergen, Oak,t7: <0.1 kU/L
Box Elder IgE: <0.1 kU/L
Cat Dander: <0.1 kU/L
D. farinae: 3.8 kU/L — ABNORMAL HIGH
Dog Dander: 0.46 kU/L — ABNORMAL HIGH
Elm IgE: 0.17 kU/L — ABNORMAL HIGH
IGE (IMMUNOGLOBULIN E), SERUM: 106 kU/L (ref <115)
Rough Pigweed  IgE: <0.1 kU/L
Sheep Sorrel IgE: <0.1 kU/L

## 2016-05-13 ENCOUNTER — Ambulatory Visit (INDEPENDENT_AMBULATORY_CARE_PROVIDER_SITE_OTHER): Payer: Medicare Other | Admitting: Pulmonary Disease

## 2016-05-13 DIAGNOSIS — R0602 Shortness of breath: Secondary | ICD-10-CM

## 2016-05-13 DIAGNOSIS — R0600 Dyspnea, unspecified: Secondary | ICD-10-CM

## 2016-05-13 DIAGNOSIS — R0689 Other abnormalities of breathing: Secondary | ICD-10-CM

## 2016-05-13 LAB — PULMONARY FUNCTION TEST
DL/VA % PRED: 89 %
DL/VA: 3.95 ml/min/mmHg/L
DLCO COR % PRED: 70 %
DLCO COR: 14.34 ml/min/mmHg
DLCO UNC % PRED: 77 %
DLCO UNC: 15.68 ml/min/mmHg
FEF 25-75 POST: 1.36 L/s
FEF 25-75 PRE: 0.41 L/s
FEF2575-%CHANGE-POST: 234 %
FEF2575-%PRED-POST: 74 %
FEF2575-%Pred-Pre: 22 %
FEV1-%CHANGE-POST: 69 %
FEV1-%PRED-POST: 68 %
FEV1-%Pred-Pre: 40 %
FEV1-Post: 1.4 L
FEV1-Pre: 0.83 L
FEV1FVC-%CHANGE-POST: 16 %
FEV1FVC-%PRED-PRE: 72 %
FEV6-%CHANGE-POST: 46 %
FEV6-%Pred-Post: 82 %
FEV6-%Pred-Pre: 56 %
FEV6-PRE: 1.46 L
FEV6-Post: 2.13 L
FEV6FVC-%Change-Post: 0 %
FEV6FVC-%PRED-PRE: 102 %
FEV6FVC-%Pred-Post: 102 %
FVC-%Change-Post: 45 %
FVC-%Pred-Post: 80 %
FVC-%Pred-Pre: 55 %
FVC-Post: 2.2 L
FVC-Pre: 1.51 L
POST FEV1/FVC RATIO: 64 %
Post FEV6/FVC ratio: 98 %
Pre FEV1/FVC ratio: 55 %
Pre FEV6/FVC Ratio: 99 %
RV % PRED: 147 %
RV: 2.93 L
TLC % pred: 110 %
TLC: 5.09 L

## 2016-05-18 ENCOUNTER — Telehealth: Payer: Self-pay | Admitting: Pulmonary Disease

## 2016-05-18 NOTE — Telephone Encounter (Signed)
Spoke with pt, advised her of her xray results and pt verbalized understanding.

## 2016-05-18 NOTE — Progress Notes (Signed)
LMOMTCB x 1 

## 2016-05-18 NOTE — Telephone Encounter (Signed)
Notes Recorded by Chilton GreathousePraveen Mannam, MD on 05/13/2016 at 2:40 PM EST Please let the patient know that the CXR does not show any acute abnormality

## 2016-05-27 ENCOUNTER — Telehealth: Payer: Self-pay | Admitting: Pulmonary Disease

## 2016-05-27 NOTE — Telephone Encounter (Signed)
Spoke with pt.  She was given a sample of Breo at last ov but she hasnt started it.  She wants to know that since her cxr did not show copd does she still need to [facility].  Please advise. Pt aware PM out of town until 06/02/15.

## 2016-06-06 ENCOUNTER — Encounter (HOSPITAL_COMMUNITY): Payer: Self-pay | Admitting: *Deleted

## 2016-06-06 ENCOUNTER — Ambulatory Visit (HOSPITAL_COMMUNITY)
Admission: EM | Admit: 2016-06-06 | Discharge: 2016-06-06 | Disposition: A | Discharge status: Home / Self Care | Payer: Medicare Other | Attending: Family Medicine | Admitting: Family Medicine

## 2016-06-06 DIAGNOSIS — J441 Chronic obstructive pulmonary disease with (acute) exacerbation: Secondary | ICD-10-CM | POA: Diagnosis not present

## 2016-06-06 DIAGNOSIS — R059 Cough, unspecified: Secondary | ICD-10-CM

## 2016-06-06 DIAGNOSIS — R05 Cough: Secondary | ICD-10-CM

## 2016-06-06 HISTORY — DX: Acute embolism and thrombosis of unspecified deep veins of unspecified lower extremity: I82.409

## 2016-06-06 MED ORDER — ALBUTEROL SULFATE (2.5 MG/3ML) 0.083% IN NEBU
INHALATION_SOLUTION | RESPIRATORY_TRACT | Status: AC
  Filled 2016-06-06: qty 3

## 2016-06-06 MED ORDER — ALBUTEROL SULFATE (2.5 MG/3ML) 0.083% IN NEBU
2.5000 mg | INHALATION_SOLUTION | Freq: Once | RESPIRATORY_TRACT | Status: AC
  Administered 2016-06-06: 2.5 mg via RESPIRATORY_TRACT

## 2016-06-06 MED ORDER — AMOXICILLIN 875 MG PO TABS: 875.0000 mg | ORAL_TABLET | Freq: Two times a day (BID) | ORAL | 0 refills | Status: DC

## 2016-06-06 MED ORDER — PREDNISONE 10 MG PO TABS: ORAL_TABLET | ORAL | 0 refills | Status: DC

## 2016-06-06 MED ORDER — SODIUM CHLORIDE 0.9 % IN NEBU
INHALATION_SOLUTION | RESPIRATORY_TRACT | Status: AC
  Filled 2016-06-06: qty 3

## 2016-06-06 MED ORDER — BENZONATATE 100 MG PO CAPS: 100.0000 mg | ORAL_CAPSULE | Freq: Three times a day (TID) | ORAL | 0 refills | Status: DC

## 2016-06-06 NOTE — ED Provider Notes (Signed)
CSN: [id number]     Arrival date & time 06/06/16  1159 History   First MD Initiated Contact with Patient 06/06/16 1232     Chief Complaint  Patient presents with  . Cough  . Sore Throat   (Consider location/radiation/quality/duration/timing/severity/associated sxs/prior Treatment) Patient c/o cough for a week.  She has SOB and has been using her SABA and it is not working.  Patient has hx of COPD and long time smoking hx.   The history is provided by the patient.  Cough  Cough characteristics:  Productive Sputum characteristics:  Yellow Severity:  Moderate Onset quality:  Gradual Duration:  1 week Timing:  Intermittent Progression:  Worsening Chronicity:  New Smoker: yes   Context: smoke exposure, upper respiratory infection and weather changes   Relieved by:  Nothing Worsened by:  Nothing Ineffective treatments:  None tried Associated symptoms: shortness of breath   Sore Throat  Associated symptoms include shortness of breath.    Past Medical History:  Diagnosis Date  . Abnormal nuclear stress test 06/10/2015  . Allergy   . Anxiety   . Arthritis    DDD lumbar; B thumbs  . Asthma    age 89  . Clotting disorder (HCC)   . Depression   . DVT (deep venous thrombosis) (HCC)   . GERD (gastroesophageal reflux disease)   . Hyperlipidemia   . Hypertension   . Psoriasiform dermatitis    Past Surgical History:  Procedure Laterality Date  . NOSE SURGERY    . TONSILLECTOMY    . TUBAL LIGATION     Family History  Problem Relation Age of Onset  . Cancer Mother 3072    colorectal cancer  . Stroke Mother 4770    CVA  . Alcohol abuse Mother   . Diabetes Mother   . Hypertension Mother   . Heart disease Father   . Cancer Maternal Grandfather     lung  . Alcohol abuse Brother   . Heart attack Brother   . Cancer Maternal Aunt     breast   Social History  Substance Use Topics  . Smoking status: Current Every Day Smoker    Packs/day: 1.00    Types: Cigarettes  .  Smokeless tobacco: Never Used  . Alcohol use No   OB History    No data available     Review of Systems  Constitutional: Positive for fatigue.  HENT: Negative.   Eyes: Negative.   Respiratory: Positive for cough and shortness of breath.   Cardiovascular: Negative.   Gastrointestinal: Negative.   Endocrine: Negative.   Genitourinary: Negative.   Musculoskeletal: Negative.   Allergic/Immunologic: Negative.   Neurological: Negative.   Hematological: Negative.   Psychiatric/Behavioral: Negative.     Allergies  Acitretin; Percodan [oxycodone-aspirin]; and Statins  Home Medications   Prior to Admission medications   Medication Sig Start Date End Date Taking? Authorizing Provider  albuterol (PROVENTIL HFA;VENTOLIN HFA) 108 (90 Base) MCG/ACT inhaler Inhale 1 puff into the lungs every 4 (four) hours as needed for wheezing or shortness of breath. 04/08/16  Yes Gwenlyn FoundJessica C Copland, MD  aspirin 325 MG tablet Take 325 mg by mouth daily.   Yes Historical Provider, MD  cholecalciferol (VITAMIN D) 1000 UNITS tablet Take 2,000 Units by mouth daily.    Yes Historical Provider, MD  clonazePAM (KLONOPIN) 1 MG tablet Take 1 tablet (1 mg total) by mouth daily. 05/03/16  Yes [name] Copland, MD  escitalopram (LEXAPRO) 10 MG tablet Take 10 mg by  mouth daily.  02/04/15  Yes Historical Provider, MD  lisinopril (PRINIVIL,ZESTRIL) 20 MG tablet Take 1 tablet (20 mg total) by mouth daily. Patient taking differently: Take 10 mg by mouth daily.  09/03/15  Yes [name], MD  tiotropium (SPIRIVA HANDIHALER) 18 MCG inhalation capsule Place 1 capsule into inhaler and inhale daily. 06/14/15  Yes [name], MD  amoxicillin (AMOXIL) 875 MG tablet Take 1 tablet (875 mg total) by mouth 2 (two) times daily. 06/06/16   Deatra Canter, FNP  benzonatate (TESSALON) 100 MG capsule Take 1 capsule (100 mg total) by mouth every 8 (eight) hours. 06/06/16   Deatra Canter, FNP  fluticasone furoate-vilanterol (BREO ELLIPTA)  100-25 MCG/INH AEPB Inhale 1 puff into the lungs daily. 05/11/16   Praveen Mannam, MD  ipratropium (ATROVENT) 0.02 % nebulizer solution Take 2.5 mLs (0.5 mg total) by nebulization 2 (two) times daily as needed for wheezing or shortness of breath. 04/08/16   Gwenlyn Found Copland, MD  predniSONE (DELTASONE) 10 MG tablet Take 4 po qd x 2d then 3po qd x 2d then 2po qd x2d then 1po qd x 2d then stop 06/06/16   Deatra Canter, FNP   Meds Ordered and Administered this Visit   Medications  albuterol (PROVENTIL) (2.5 MG/3ML) 0.083% nebulizer solution 2.5 mg (2.5 mg Nebulization Given 06/06/16 1240)    BP 121/78   Pulse 94   Temp 98.5 F (36.9 C) (Temporal) Comment (Src): pt just drank PO fluids  Resp 22   SpO2 94%  No data found.   Physical Exam  Constitutional: She appears well-developed and well-nourished.  HENT:  Head: Normocephalic and atraumatic.  Right Ear: External ear normal.  Left Ear: External ear normal.  Mouth/Throat: Oropharynx is clear and moist.  Eyes: Conjunctivae and EOM are normal. Pupils are equal, round, and reactive to light.  Neck: Normal range of motion. Neck supple.  Cardiovascular: Normal rate, regular rhythm and normal heart sounds.   Pulmonary/Chest: Effort normal.  BBS diminished throughout.  Abdominal: Soft. Bowel sounds are normal.  Nursing note and vitals reviewed.   Urgent Care Course   Clinical Course     Procedures (including critical care time)  Labs Review Labs Reviewed - No data to display  Imaging Review No results found.   Visual Acuity Review  Right Eye Distance:   Left Eye Distance:   Bilateral Distance:    Right Eye Near:   Left Eye Near:    Bilateral Near:         MDM   1. COPD exacerbation (HCC)   2. Cough    Amoxicillin 875mg  one po bid x 7 days #14 Prednisone 10mg  4 po qd x 2d then 3po qd x 2d then 2po qd x2d then 1 po qd x 2d then stop #20 Tessalon perles 100mg  one po tid prn Neb tx Continue using SABA MDI and ICS  inhaler Follow up with PCP     Deatra Canter, FNP 06/06/16 1306    Deatra Canter, FNP 06/06/16 1610

## 2016-06-06 NOTE — ED Triage Notes (Signed)
Started with productive cough approx 1 wk ago, nasal congestion.  Now having some SOB and sore throat.  Has been using Ventolin & Spiriva inhaler, chloraseptic, and salt.  Unsure if fevers.

## 2016-06-07 NOTE — Telephone Encounter (Signed)
PM please advise. Thanks  

## 2016-06-08 NOTE — Telephone Encounter (Signed)
Please let the patient know that her even though the X ray is normal the PFTs confirm that she has COPD. She has reversibility after bronchodilator administration. I believe she'll benefit from continuing on the [facility]o.

## 2016-06-09 ENCOUNTER — Encounter (INDEPENDENT_AMBULATORY_CARE_PROVIDER_SITE_OTHER): Payer: Self-pay

## 2016-06-09 NOTE — Telephone Encounter (Signed)
Per [place] Patient would not leave a call back number other than what we have documented. Stated that she did not want to continue calling back and forth and wanted us to email her - Pt is not on mychart so she asked that we just mail her a letter with the reason we are calling. Tasha [name] stated that she said she was sick and going back to bed.  Letter printed per patient request with CXR & PFT results. Advised in the letter that we need her to contact our office to verify phone numbers on her chart so that I we can better reach her > we have left several messages for her and have had no success in reaching her. MyChart sign up information also sent to her via mail attached to the letter.   Will send this to Dr Ferrel LoganManna to make him aware and sign off.

## 2016-06-09 NOTE — Telephone Encounter (Signed)
Pt returned call. Informed her of the recs per PM. Pt verbalized understanding and states [facility] and call back regardless, if it is working well for her or not, for a prescription. Nothing further needed at this time.

## 2016-06-09 NOTE — Telephone Encounter (Signed)
lmtcb for pt.  

## 2016-06-09 NOTE — Telephone Encounter (Signed)
LM x 1 

## 2016-06-09 NOTE — Telephone Encounter (Signed)
LM x 1 on number requested for call back in initial message.

## 2016-06-09 NOTE — Telephone Encounter (Signed)
Pt returning call.Tasha [name] ° °

## 2016-06-09 NOTE — Telephone Encounter (Signed)
Patient is upset that she cannot reach anyone when she calls. I tried each number in triage and unable to get anyone. The patient was very upset and states she has been in the bed sick and has been waiting to hear from someone for weeks.  States if there is anything she needs to know to mail it to her. I apologized and let her know I would send the message to triage.

## 2016-06-29 ENCOUNTER — Other Ambulatory Visit: Payer: Self-pay | Admitting: Family Medicine

## 2016-06-29 DIAGNOSIS — Z1231 Encounter for screening mammogram for malignant neoplasm of breast: Secondary | ICD-10-CM

## 2016-07-15 ENCOUNTER — Other Ambulatory Visit: Payer: Self-pay | Admitting: Family Medicine

## 2016-07-15 DIAGNOSIS — F411 Generalized anxiety disorder: Secondary | ICD-10-CM

## 2016-07-15 DIAGNOSIS — F4322 Adjustment disorder with anxiety: Secondary | ICD-10-CM

## 2016-07-15 NOTE — Telephone Encounter (Signed)
Relation to ZO:XWRUpt:self Call back number:[phone] [facility] [id number] - [place], [place] - [address] [phone] (Phone) [phone] (Fax)     Reason for call:  Patient requesting a refill clonazePAM (KLONOPIN) 1 MG tablet, patient states she will run out buy Monday, requesting a 90 day supply please advise

## 2016-07-15 NOTE — Telephone Encounter (Signed)
Received refill request for clonazePAM (KLONOPIN) 1 MG tablet. Is it ok to refill? Please advise.

## 2016-07-16 ENCOUNTER — Other Ambulatory Visit: Payer: Self-pay | Admitting: Emergency Medicine

## 2016-07-16 DIAGNOSIS — F411 Generalized anxiety disorder: Secondary | ICD-10-CM

## 2016-07-16 DIAGNOSIS — F4322 Adjustment disorder with anxiety: Secondary | ICD-10-CM

## 2016-07-16 MED ORDER — CLONAZEPAM 1 MG PO TABS: 1.0000 mg | ORAL_TABLET | Freq: Every day | ORAL | 0 refills | Status: DC

## 2016-07-16 NOTE — Telephone Encounter (Signed)
[facility]

## 2016-07-16 NOTE — Telephone Encounter (Signed)
Faxed hardcopy for clonazepam #20 no refills  [facility] [place] Called the patient to inform [facility] [place].(LEFT A DETAILED MESSAGE)

## 2016-07-16 NOTE — Telephone Encounter (Signed)
clonazePAM (KLONOPIN) 1 MG tablet, patient states she will run out buy Monday, requesting a 90 day supply please advise.   Last office visit: 05/03/2016 and last refill: 03/09/2016. Dr. Cyndie Chimeopland's pt.

## 2016-07-16 NOTE — Telephone Encounter (Signed)
[facility] [place] as fax failed clonazepam #20 no refills-- refilled by Dr. Laury AxonLowne.

## 2016-07-26 ENCOUNTER — Ambulatory Visit
Admission: RE | Admit: 2016-07-26 | Discharge: 2016-07-26 | Disposition: A | Discharge status: Home / Self Care | Payer: Medicare Other | Source: Ambulatory Visit | Attending: Family Medicine | Admitting: Family Medicine

## 2016-07-26 DIAGNOSIS — Z1231 Encounter for screening mammogram for malignant neoplasm of breast: Secondary | ICD-10-CM | POA: Diagnosis not present

## 2016-08-09 ENCOUNTER — Ambulatory Visit: Payer: Medicare Other | Admitting: Pulmonary Disease

## 2016-08-12 ENCOUNTER — Other Ambulatory Visit: Payer: Self-pay | Admitting: Emergency Medicine

## 2016-08-12 DIAGNOSIS — F411 Generalized anxiety disorder: Secondary | ICD-10-CM

## 2016-08-12 DIAGNOSIS — F4322 Adjustment disorder with anxiety: Secondary | ICD-10-CM

## 2016-08-12 MED ORDER — CLONAZEPAM 1 MG PO TABS: 1.0000 mg | ORAL_TABLET | Freq: Every day | ORAL | 2 refills | Status: DC

## 2016-08-12 NOTE — Telephone Encounter (Signed)
NCCSR: last fill of 20 pills on 2/16- she fills on average every 2-3 months

## 2016-08-12 NOTE — Telephone Encounter (Signed)
[facility] clon[facility]AM (KLONOPIN) 1 MG tablet. Last office 04/08/2016 and last refill 07/16/16. Is it ok to refill? Please advise.

## 2016-08-24 ENCOUNTER — Ambulatory Visit (INDEPENDENT_AMBULATORY_CARE_PROVIDER_SITE_OTHER): Payer: Medicare Other | Admitting: Pulmonary Disease

## 2016-08-24 ENCOUNTER — Encounter: Payer: Self-pay | Admitting: Pulmonary Disease

## 2016-08-24 VITALS — BP 122/72 | HR 74 | Ht 61.0 in | Wt 146.8 lb

## 2016-08-24 DIAGNOSIS — R0602 Shortness of breath: Secondary | ICD-10-CM

## 2016-08-24 DIAGNOSIS — F17200 Nicotine dependence, unspecified, uncomplicated: Secondary | ICD-10-CM

## 2016-08-24 DIAGNOSIS — J449 Chronic obstructive pulmonary disease, unspecified: Secondary | ICD-10-CM | POA: Insufficient documentation

## 2016-08-24 MED ORDER — TIOTROPIUM BROMIDE MONOHYDRATE 2.5 MCG/ACT IN AERS: 2.0000 | INHALATION_SPRAY | Freq: Every day | RESPIRATORY_TRACT | 0 refills | Status: DC

## 2016-08-24 MED ORDER — TIOTROPIUM BROMIDE MONOHYDRATE 18 MCG IN CAPS: ORAL_CAPSULE | RESPIRATORY_TRACT | 11 refills | Status: DC

## 2016-08-24 NOTE — Progress Notes (Signed)
Tasha [name]    [id number]    [birth year]  Primary Care Physician:COPLAND,JESSICA, MD  Referring Physician: Pearline Cables, MD [address] [place], [place] [address]  Chief complaint:   Follow up for COPD GOLD C (CAT score 9, 2 exacerbations over the past year)  HPI: 66 year old with past medical history of asthma, hypertension, hypercholesterolemia,, left DVT. She has complains of dyspnea on exertion for the past several months. She does not have dyspnea at rest. She notes chronic productive cough with white mucus production, daily wheezing. She denies any fevers, chills, chest pain, palpitation.  She is an active smoker and smoked 1 pack per day for about 50 years. She quit drinking 30 years ago. 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 She does not note any exposures at work or at home. She was diagnosed with left DVT 4 years ago and treated with Xarelto for 6 months. Repeat ultrasound showed resolution of the DVT and she's been off antibiotic coagulation since then. She is on albuterol rescue inhaler. She is also taking Atrovent nebs and Spiriva on alternate days. She states that these inhalers and nebulizers to help with her breathing.  Interim history: She was given Spiriva at visit but she did not like it as it was not effective. She is back on Spiriva and feels that this helps with the symptoms. She continues on albuterol rescue inhaler. She has not had any exacerbations or ER visits since last  Outpatient Encounter Prescriptions as of 08/24/2016  Medication Sig  . albuterol (PROVENTIL HFA;VENTOLIN HFA) 108 (90 Base) MCG/ACT inhaler Inhale 1 puff into the lungs every 4 (four) hours as needed for wheezing or shortness of breath.  [name] aspirin 325 MG tablet Take 325 mg by mouth daily.  . cholecalciferol (VITAMIN D) 1000 UNITS tablet Take 2,000 Units by mouth daily.   . clonazePAM (KLONOPIN) 1 MG tablet Take 1 tablet (1 mg total) by  mouth daily. As needed for anxiety  . escitalopram (LEXAPRO) 10 MG tablet Take 10 mg by mouth daily.   [name] ipratropium (ATROVENT) 0.02 % nebulizer solution Take 2.5 mLs (0.5 mg total) by nebulization 2 (two) times daily as needed for wheezing or shortness of breath.  . lisinopril (PRINIVIL,ZESTRIL) 20 MG tablet Take 1 tablet (20 mg total) by mouth daily. (Patient taking differently: Take 10 mg by mouth daily. )  . tiotropium (SPIRIVA HANDIHALER) 18 MCG inhalation capsule Place 1 capsule into inhaler and inhale daily.  . [DISCONTINUED] amoxicillin (AMOXIL) 875 MG tablet Take 1 tablet (875 mg total) by mouth 2 (two) times daily.  . [DISCONTINUED] benzonatate (TESSALON) 100 MG capsule Take 1 capsule (100 mg total) by mouth every 8 (eight) hours.  . [DISCONTINUED] fluticasone furoate-vilanterol (BREO ELLIPTA) 100-25 MCG/INH AEPB Inhale 1 puff into the lungs daily.  . [DISCONTINUED] predniSONE (DELTASONE) 10 MG tablet Take 4 po qd x 2d then 3po qd x 2d then 2po qd x2d then 1po qd x 2d then stop   No facility-administered encounter medications on file as of 08/24/2016.     Allergies as of 08/24/2016 - Review Complete 08/24/2016  Allergen Reaction Noted  . Acitretin Swelling and Rash 09/23/2011  . Percodan [oxycodone-aspirin]  05/20/2015  . Statins Other (See Comments) 04/17/2008    Past Medical History:  Diagnosis Date  . Abnormal nuclear stress test 06/10/2015  . Allergy   . Anxiety   . Arthritis  DDD lumbar; B thumbs  . Asthma    age 66  . Clotting disorder (HCC)   . Depression   . DVT (deep venous thrombosis) (HCC)   . GERD (gastroesophageal reflux disease)   . Hyperlipidemia   . Hypertension   . Psoriasiform dermatitis     Past Surgical History:  Procedure Laterality Date  . NOSE SURGERY    . TONSILLECTOMY    . TUBAL LIGATION      Family History  Problem Relation Age of Onset  . Cancer Mother 372    colorectal cancer  . Stroke Mother [id number]    CVA  . Alcohol abuse Mother     . Diabetes Mother   . Hypertension Mother   . Heart disease Father   . Cancer Maternal Grandfather     lung  . Alcohol abuse Brother   . Heart attack Brother   . Cancer Maternal Aunt     breast  . Breast cancer Maternal Aunt     Social History   Social History  . Marital status: Single    Spouse name: N/A  . Number of children: N/A  . Years of education: N/A   Occupational History  . Not on file.   Social History Main Topics  . Smoking status: Current Every Day Smoker    Packs/day: 1.00    Types: Cigarettes  . Smokeless tobacco: Never Used  . Alcohol use No  . Drug use: No  . Sexual activity: Not on file   Other Topics Concern  . Not on file   Social History Narrative   Marital status: single; not dating and not interested      Children: none      Lives: alone with dog      Employment:  Semi-retired; works a little bit; [employer] since 1980; maintains one client      Tobacco: 1 ppd x 50 years      Alcohol: quit; IN [employer] SINCE 09/25/1985.  Attends AA twice weekly; still sponsors others.      Drugs:  None      Exercise:       ADLs: independent with ADLs; no assistant devices.      Advanced Directives:  Yes.  desires FULL CODE.   Review of systems: Review of Systems  Constitutional: Negative for fever and chills.  HENT: Negative.   Eyes: Negative for blurred vision.  Respiratory: as per HPI  Cardiovascular: Negative for chest pain and palpitations.  Gastrointestinal: Negative for vomiting, diarrhea, blood per rectum. Genitourinary: Negative for dysuria, urgency, frequency and hematuria.  Musculoskeletal: Negative for myalgias, back pain and joint pain.  Skin: Negative for itching and rash.  Neurological: Negative for dizziness, tremors, focal weakness, seizures and loss of consciousness.  Endo/Heme/Allergies: Negative for environmental allergies.  Psychiatric/Behavioral: Negative for depression, suicidal ideas and hallucinations.  All other  systems reviewed and are negative.  Physical Exam: Blood pressure 122/72, pulse 74, height 5\' 1"  (1.549 m), weight 146 lb 12.8 oz (66.6 kg), SpO2 91 %. Gen:      No acute distress HEENT:  EOMI, sclera anicteric Neck:     No masses; no thyromegaly Lungs:    Scattered exp wheeze; normal respiratory effort CV:         Regular rate and rhythm; no murmurs Abd:      + bowel sounds; soft, non-tender; no palpable masses, no distension Ext:    No edema; adequate peripheral perfusion Skin:      Warm  and dry; no rash Neuro: alert and oriented x 3 Psych: normal mood and affect  Data Reviewed: PFTs 04/08/16 FVC 1.80 [69%) FEV1 1.2 to [61%) F/F 68 Moderate obstructive defect.  PFTs 05/13/16 FVC 2.20 (80%) FEV1 1.40 (68%) F/F 64 TLC 110% RV/TLC 134% DLCO 77% Ordered-severe obstruction with significant reversibility, Air trapping Mild diffusion defect  Chest x-ray 04/14/15-no active cardiopulmonary disease. Chest x-ray 05/11/16- no active cardiopulmonary disease. I have reviewed all images personally.  FENO 05/12/15- 6 IgE 05/11/16- 106  Assessment:  #1 COPD GOLD C She is being maintained on Spiriva and albuterol which helps with her symptoms. Her PFTs show markedly reversibility. She may benefit from additional inhaled corticosteroid, LABA but she feels breo did not help and she cannot afford more medications as she is in the doughnut hole. She would prefer to just continue the Spiriva.  There is a question of asthma, lung fibrosis but FENO, IgE levels are low and CXR does not show any fibrosis  #2 Active smoker Smoking cessation was discussed with her. Time spent counseling- 5 mins. Refer for low dose screening CT  Plan/Recommendations: - Continue Spiriva - Smoking cessation - Low dose screening CT.   Chilton Greathouse MD [facility] Pager [phone] 08/24/2016, 1:32 PM  CC: Copland, Gwenlyn Found, MD

## 2016-08-24 NOTE — Patient Instructions (Addendum)
Continue the Spiriva. We will samples of twisthaler today. Please let us know if this works better for you than respimat and we will send in a prescription Continue using the albuterol inhaler as needed   Will refer you for low-dose screening CT of the chest Return to clinic in 6 months.

## 2016-08-24 NOTE — Progress Notes (Signed)
Patient ID: Tasha CanaryJanet P [name], female   DOB: [birth year], 66 y.o.   MRN: [id number] Patient seen in the office today and instructed on use of spiriva respimat.  Patient expressed understanding and demonstrated technique.

## 2016-08-31 ENCOUNTER — Other Ambulatory Visit: Payer: Self-pay | Admitting: Acute Care

## 2016-08-31 DIAGNOSIS — F17210 Nicotine dependence, cigarettes, uncomplicated: Principal | ICD-10-CM

## 2016-09-01 ENCOUNTER — Encounter: Payer: Self-pay | Admitting: Family Medicine

## 2016-09-01 ENCOUNTER — Ambulatory Visit (INDEPENDENT_AMBULATORY_CARE_PROVIDER_SITE_OTHER): Payer: Medicare Other | Admitting: Family Medicine

## 2016-09-01 VITALS — BP 127/75 | HR 77 | Temp 98.3°F | Ht 61.0 in | Wt 145.2 lb

## 2016-09-01 DIAGNOSIS — F411 Generalized anxiety disorder: Secondary | ICD-10-CM | POA: Diagnosis not present

## 2016-09-01 DIAGNOSIS — G479 Sleep disorder, unspecified: Secondary | ICD-10-CM | POA: Diagnosis not present

## 2016-09-01 DIAGNOSIS — Z5181 Encounter for therapeutic drug level monitoring: Secondary | ICD-10-CM

## 2016-09-01 DIAGNOSIS — I10 Essential (primary) hypertension: Secondary | ICD-10-CM | POA: Diagnosis not present

## 2016-09-01 MED ORDER — ESCITALOPRAM OXALATE 10 MG PO TABS: 10.0000 mg | ORAL_TABLET | Freq: Every day | ORAL | 3 refills | Status: DC

## 2016-09-01 MED ORDER — TRAZODONE HCL 50 MG PO TABS: 25.0000 mg | ORAL_TABLET | Freq: Every evening | ORAL | 3 refills | Status: DC | PRN

## 2016-09-01 NOTE — Progress Notes (Signed)
Pre visit review using our clinic tool,if applicable. No additional management support is needed unless otherwise documented below in the visit note.  

## 2016-09-01 NOTE — Patient Instructions (Addendum)
You can stop your lisinorrpil- keep an eye on your BP If you start running higher than 135/85 on a regular basis go back on 10 mg of lisinopril  We are going to add a low dose of trazodone for you at bedtime to see if it will help you sleep  Start with a 1/2 tablet and go to a whole if needed If you want to, I think it would be fine to try decreasing to a 1/2 lexapro

## 2016-09-01 NOTE — Progress Notes (Signed)
[facility] [address], [address] [place], [place] [address] [phone]  Date:  09/01/2016   Name:  Tasha [name]   DOB:  [birth year]   MRN:  [id number]  PCP:  Abbe Amsterdam, MD    Chief Complaint: Insomnia and Medication Refill (Lexapro)   History of Present Illness:  Tasha [name] is a 66 y.o. very pleasant female patient who presents with the following:  History of COPD, tobacco abuse, depression, asthma I last saw her in November Here today to discuss trouble sleeping  He notes that she geneally takes a long time to get to sleep- between 1.5 and 3 hours to get to sleep. She experiences "jerks" where her body will jerk suddenly as she is falling asleep- this will wake her up again She has used very occasional klonopin but would like to have something non- addictive if possible.  She has most of her last rx of klonopin and asks me to dispose of it  She has noted more difficulty sleeping for about 6 months   She has been sober from alcohol for over 30 years.  She proudly shows me he 30 year AA chip today She has been stable on her lexapro for a long time- she is on 10 mg currently.  She wonders if she still needs this medication and would be interested in decreasing the dosage She is taking aspirin daily and a vitamin D supplement She is on lisinopril but only takes 10 mg every 2-3 days. She has been checking her BP at home on a regular basis and her readings are excellent on this low dose.  She would like to D/C lisinopril if possible  Patient Active Problem List   Diagnosis Date Noted  . COPD, group C, by GOLD 2017 classification (HCC) 08/24/2016  . Nut allergy 09/03/2015  . Abnormal nuclear stress test 06/10/2015  . Family history of colorectal cancer 05/27/2015  . Tobacco abuse 05/27/2015  . 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) 04/28/2015  . Pure hypercholesterolemia 04/28/2015  . Essential hypertension 04/24/2014  . DVT  (deep venous thrombosis) (HCC) 03/10/2012  . 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 HYPERGLYCEMIA 02/10/2009  . ENDOMETRIAL POLYP 10/01/2008  . HYPERLIPIDEMIA 04/17/2008  . DEPRESSION 04/17/2008  . ASTHMA 04/17/2008  . SCOLIOSIS 04/17/2008  . COLONIC POLYPS, HX OF 04/17/2008    Past Medical History:  Diagnosis Date  . Abnormal nuclear stress test 06/10/2015  . Allergy   . Anxiety   . Arthritis    DDD lumbar; B thumbs  . Asthma    age 21  . Clotting disorder (HCC)   . Depression   . DVT (deep venous thrombosis) (HCC)   . GERD (gastroesophageal reflux disease)   . Hyperlipidemia   . Hypertension   . Psoriasiform dermatitis     Past Surgical History:  Procedure Laterality Date  . NOSE SURGERY    . TONSILLECTOMY    . TUBAL LIGATION      Social History  Substance Use Topics  . Smoking status: Current Every Day Smoker    Packs/day: 1.00    Types: Cigarettes  . Smokeless tobacco: Never Used  . Alcohol use No    Family History  Problem Relation Age of Onset  . Cancer Mother 86    colorectal cancer  . Stroke Mother 29    CVA  . Alcohol abuse Mother   . Diabetes Mother   . Hypertension Mother   . Heart  disease Father   . Cancer Maternal Grandfather     lung  . Alcohol abuse Brother   . Heart attack Brother   . Cancer Maternal Aunt     breast  . Breast cancer Maternal Aunt     Allergies  Allergen Reactions  . Acitretin Swelling and Rash  . Percodan [Oxycodone-Aspirin]   . Statins Other (See Comments)    Muscle aches in legs     Medication list has been reviewed and updated.  Current Outpatient Prescriptions on File Prior to Visit  Medication Sig Dispense Refill  . albuterol (PROVENTIL HFA;VENTOLIN HFA) 108 (90 Base) MCG/ACT inhaler Inhale 1 puff into the lungs every 4 (four) hours as needed for wheezing or shortness of breath. 1 Inhaler 11  . aspirin 325 MG tablet Take 325 mg by mouth daily.    . cholecalciferol (VITAMIN D) 1000 UNITS tablet Take  2,000 Units by mouth daily.     . clonazePAM (KLONOPIN) 1 MG tablet Take 1 tablet (1 mg total) by mouth daily. As needed for anxiety 20 tablet 2  . ipratropium (ATROVENT) 0.02 % nebulizer solution Take 2.5 mLs (0.5 mg total) by nebulization 2 (two) times daily as needed for wheezing or shortness of breath. 75 mL 12  . tiotropium (SPIRIVA HANDIHALER) 18 MCG inhalation capsule Place 1 capsule into inhaler and inhale daily. 30 capsule 11  . Tiotropium Bromide Monohydrate (SPIRIVA RESPIMAT) 2.5 MCG/ACT AERS Inhale 2 puffs into the lungs daily. 1 Inhaler 0   No current facility-administered medications on file prior to visit.     Review of Systems:  As per HPI- otherwise negative.  Notes that her mood is good- no SI.  Not especially bothered by anxiety    Physical Examination: Vitals:   09/01/16 1550  BP: 127/75  Pulse: 77  Temp: 98.3 F (36.8 C)   Vitals:   09/01/16 1550  Weight: 145 lb 3.2 oz (65.9 kg)  Height:  (1.549 m)   Body mass index is 27.44 kg/m. Ideal Body Weight: Weight in (lb) to have BMI = 25: 132  GEN: WDWN, NAD, Non-toxic, A & O x 3, mild overweight, looks well HEENT: Atraumatic, Normocephalic. Neck supple. No masses, No LAD. Ears and Nose: No external deformity. CV: RRR, No M/G/R. No JVD. No thrill. No extra heart sounds. PULM: CTA B, no wheezes, crackles, rhonchi. No retractions. No resp. distress. No accessory muscle use. ABD: S, NT, ND, +BS. No rebound. No HSM. EXTR: No c/c/e NEURO Normal gait.  PSYCH: Normally interactive. Conversant. Not depressed or anxious appearing.  Calm demeanor.    Assessment and Plan: Generalized anxiety disorder - Plan: escitalopram (LEXAPRO) 10 MG tablet  Difficulty sleeping - Plan: traZODone (DESYREL) 50 MG tablet  Medication monitoring encounter - Plan: Basic metabolic panel  Essential hypertension  Here today to discuss some trouble sleeping Would like to add trazodone to her regiment for this.  Discussed need to  monitor sodium and also risk of serotonin syndrome.  She would like to also stop her lisinopril which I agree is reasonable.  She will continue to monitor her home BP She would like to try decreasing her lexapro to 1/2 dose esp as we are adding trazodone.  This is fine, she will let me know if any problems    Signed Abbe Amsterdam, MD

## 2016-09-02 LAB — BASIC METABOLIC PANEL
BUN: 15 mg/dL (ref 6–23)
CO2: 29 mEq/L (ref 19–32)
Calcium: 9.9 mg/dL (ref 8.4–10.5)
Chloride: 109 mEq/L (ref 96–112)
Creatinine, Ser: 0.9 mg/dL (ref 0.40–1.20)
GFR: 66.18 mL/min (ref >60.00)
GLUCOSE: 113 mg/dL — AB (ref 70–99)
Potassium: 4 mEq/L (ref 3.5–5.1)
SODIUM: 142 meq/L (ref 135–145)

## 2016-09-04 ENCOUNTER — Encounter: Payer: Self-pay | Admitting: Family Medicine

## 2016-09-09 DIAGNOSIS — H2513 Age-related nuclear cataract, bilateral: Secondary | ICD-10-CM | POA: Diagnosis not present

## 2016-09-10 ENCOUNTER — Encounter: Payer: Self-pay | Admitting: Acute Care

## 2016-09-10 ENCOUNTER — Ambulatory Visit (INDEPENDENT_AMBULATORY_CARE_PROVIDER_SITE_OTHER): Payer: Medicare Other | Admitting: Acute Care

## 2016-09-10 ENCOUNTER — Ambulatory Visit (INDEPENDENT_AMBULATORY_CARE_PROVIDER_SITE_OTHER)
Admission: RE | Admit: 2016-09-10 | Discharge: 2016-09-10 | Disposition: A | Discharge status: Home / Self Care | Payer: Medicare Other | Source: Ambulatory Visit | Attending: Acute Care | Admitting: Acute Care

## 2016-09-10 DIAGNOSIS — F17210 Nicotine dependence, cigarettes, uncomplicated: Principal | ICD-10-CM

## 2016-09-10 DIAGNOSIS — Z87891 Personal history of nicotine dependence: Secondary | ICD-10-CM | POA: Diagnosis not present

## 2016-09-10 NOTE — Progress Notes (Signed)
Shared Decision Making Visit Lung Cancer Screening Program [phone])   Eligibility:  Age 66 y.o.  Pack Years Smoking History Calculation 46 pack year smoking history (# packs/per year x # years smoked)  Recent History of coughing up blood  no  Unexplained weight loss? no ( >Than 15 pounds within the last 6 months )  Prior History Lung / other cancer no (Diagnosis within the last 5 years already requiring surveillance chest CT Scans).  Smoking Status Current Smoker  Former Smokers: Years since quit: NA  Quit Date: NA  Visit Components:  Discussion included one or more decision making aids. yes  Discussion included risk/benefits of screening. yes  Discussion included potential follow up diagnostic testing for abnormal scans. yes  Discussion included meaning and risk of over diagnosis. yes  Discussion included meaning and risk of False Positives. yes  Discussion included meaning of total radiation exposure. yes  Counseling Included:  Importance of adherence to annual lung cancer LDCT screening. yes  Impact of comorbidities on ability to participate in the program. yes  Ability and willingness to under diagnostic treatment. yes  Smoking Cessation Counseling:  Current Smokers:   Discussed importance of smoking cessation. yes  Information about tobacco cessation classes and interventions provided to patient. yes  Patient provided with "ticket" for LDCT Scan. yes  Symptomatic Patient. no  Counseling  Diagnosis Code: Tobacco Use Z72.0  Asymptomatic Patient yes  Counseling (Intermediate counseling: > three minutes counseling) Z3086  Former Smokers:   Discussed the importance of maintaining cigarette abstinence. yes  Diagnosis Code: Personal History of Nicotine Dependence. V78.469  Information about tobacco cessation classes and interventions provided to patient. Yes  Patient provided with "ticket" for LDCT Scan. yes  Written Order for Lung Cancer  Screening with LDCT placed in Epic. Yes (CT Chest Lung Cancer Screening Low Dose W/O CM) [id number] Z12.2-Screening of respiratory organs Z87.891-Personal history of nicotine dependence  I have spent 25 minutes of face to face time with  Ms. Warbington discussing the risks and benefits of lung cancer screening. We viewed a power point together that explained in detail the above noted topics. We paused at intervals to allow for questions to be asked and answered to ensure understanding.We discussed that the single most powerful action that she can take to decrease her risk of developing lung cancer is to quit smoking. We discussed whether or not she is ready to commit to setting a quit date. She is not ready to set a quit date.We discussed options for tools to aid in quitting smoking including nicotine replacement therapy, non-nicotine medications, support groups, Quit Smart classes, and behavior modification. We discussed that often times setting smaller, more achievable goals, such as eliminating 1 cigarette a day for a week and then 2 cigarettes a day for a week can be helpful in slowly decreasing the number of cigarettes smoked. This allows for a sense of accomplishment as well as providing a clinical benefit. I gave her the " Be Stronger Than Your Excuses" card with contact information for community resources, classes, free nicotine replacement therapy, and access to mobile apps, text messaging, and on-line smoking cessation help. I have also given her my card and contact information in the event she needs to contact me. We discussed the time and location of the scan, and that either Abigail Miyamoto, RN , or I will call with the results within 24-48 hours of receiving them. I have offered her a copy of the power point we viewed  as  a resource in the event they need reinforcement of the concepts we discussed today in the office. The patient verbalized understanding of all of  the above and had no further questions  upon leaving the office. They have my contact information in the event they have any further questions.   I spent 4 minutes counseling about smoking cessation this visit  I explained that there has been a high incidence of CAD noted on this exam. I explained that this is a non-gated exam, and degree and severity of disease cannot be determined.Ms. Peri is currently not on statin therapy. She does have her cholesterol checked on an annual basis.I told the patient we will send a copy of her results to her PCP. She verbalized understanding of the above.   Bevelyn Ngo, NP 09/10/2016

## 2016-09-15 ENCOUNTER — Other Ambulatory Visit: Payer: Self-pay | Admitting: Acute Care

## 2016-09-15 DIAGNOSIS — F17210 Nicotine dependence, cigarettes, uncomplicated: Principal | ICD-10-CM

## 2016-09-15 MED ORDER — TIOTROPIUM BROMIDE MONOHYDRATE 2.5 MCG/ACT IN AERS: 2.0000 | INHALATION_SPRAY | Freq: Every day | RESPIRATORY_TRACT | 0 refills | Status: DC

## 2016-09-23 ENCOUNTER — Telehealth: Payer: Self-pay | Admitting: Pulmonary Disease

## 2016-09-24 NOTE — Telephone Encounter (Signed)
Will forward to denise to follow up on forms. thanks

## 2016-09-27 NOTE — Telephone Encounter (Signed)
Forms left in Dr Shirlee More look at box.  Will forward to [facility] to follow up on.

## 2016-09-29 NOTE — Telephone Encounter (Signed)
Forms have been signs and faxed to provided number. Received successful confirmation. Lm to make pt aware. Will hold message to f/u on.

## 2016-09-30 NOTE — Telephone Encounter (Signed)
lmtcb for pt to inform her the pt assistance forms have been faxed.

## 2016-09-30 NOTE — Telephone Encounter (Signed)
Patient called back - advised that patient assistance forms were faxed - pt needs nothing further -pr

## 2016-10-18 NOTE — Telephone Encounter (Signed)
Spoke with Velma with [employer], who states patient assistance has been denied due to income being exceeded. lmtcb x1 for pt.

## 2016-10-27 NOTE — Telephone Encounter (Signed)
lmtcb x2 for pt. 

## 2016-11-19 DIAGNOSIS — L0889 Other specified local infections of the skin and subcutaneous tissue: Secondary | ICD-10-CM | POA: Diagnosis not present

## 2016-11-19 DIAGNOSIS — L0109 Other impetigo: Secondary | ICD-10-CM | POA: Diagnosis not present

## 2016-11-19 DIAGNOSIS — L2089 Other atopic dermatitis: Secondary | ICD-10-CM | POA: Diagnosis not present

## 2017-01-11 ENCOUNTER — Other Ambulatory Visit: Payer: Self-pay | Admitting: Family Medicine

## 2017-01-11 DIAGNOSIS — G479 Sleep disorder, unspecified: Secondary | ICD-10-CM

## 2017-01-11 DIAGNOSIS — F4322 Adjustment disorder with anxiety: Secondary | ICD-10-CM

## 2017-01-11 DIAGNOSIS — F411 Generalized anxiety disorder: Secondary | ICD-10-CM

## 2017-01-11 NOTE — Telephone Encounter (Signed)
Pt is requesting refill on clonazepam and trazodone, last OV 08/2016. Please advise.

## 2017-01-19 DIAGNOSIS — M4722 Other spondylosis with radiculopathy, cervical region: Secondary | ICD-10-CM | POA: Diagnosis not present

## 2017-01-19 DIAGNOSIS — M9901 Segmental and somatic dysfunction of cervical region: Secondary | ICD-10-CM | POA: Diagnosis not present

## 2017-01-20 DIAGNOSIS — M4722 Other spondylosis with radiculopathy, cervical region: Secondary | ICD-10-CM | POA: Diagnosis not present

## 2017-01-20 DIAGNOSIS — M9901 Segmental and somatic dysfunction of cervical region: Secondary | ICD-10-CM | POA: Diagnosis not present

## 2017-01-24 DIAGNOSIS — M4722 Other spondylosis with radiculopathy, cervical region: Secondary | ICD-10-CM | POA: Diagnosis not present

## 2017-01-24 DIAGNOSIS — M9901 Segmental and somatic dysfunction of cervical region: Secondary | ICD-10-CM | POA: Diagnosis not present

## 2017-01-26 DIAGNOSIS — M9901 Segmental and somatic dysfunction of cervical region: Secondary | ICD-10-CM | POA: Diagnosis not present

## 2017-01-26 DIAGNOSIS — M4722 Other spondylosis with radiculopathy, cervical region: Secondary | ICD-10-CM | POA: Diagnosis not present

## 2017-02-01 DIAGNOSIS — M4722 Other spondylosis with radiculopathy, cervical region: Secondary | ICD-10-CM | POA: Diagnosis not present

## 2017-02-01 DIAGNOSIS — M9901 Segmental and somatic dysfunction of cervical region: Secondary | ICD-10-CM | POA: Diagnosis not present

## 2017-02-07 DIAGNOSIS — M4722 Other spondylosis with radiculopathy, cervical region: Secondary | ICD-10-CM | POA: Diagnosis not present

## 2017-02-07 DIAGNOSIS — M9901 Segmental and somatic dysfunction of cervical region: Secondary | ICD-10-CM | POA: Diagnosis not present

## 2017-02-09 DIAGNOSIS — M9901 Segmental and somatic dysfunction of cervical region: Secondary | ICD-10-CM | POA: Diagnosis not present

## 2017-02-09 DIAGNOSIS — M4722 Other spondylosis with radiculopathy, cervical region: Secondary | ICD-10-CM | POA: Diagnosis not present

## 2017-02-14 DIAGNOSIS — M4722 Other spondylosis with radiculopathy, cervical region: Secondary | ICD-10-CM | POA: Diagnosis not present

## 2017-02-14 DIAGNOSIS — M9901 Segmental and somatic dysfunction of cervical region: Secondary | ICD-10-CM | POA: Diagnosis not present

## 2017-02-15 ENCOUNTER — Ambulatory Visit (INDEPENDENT_AMBULATORY_CARE_PROVIDER_SITE_OTHER): Payer: Medicare Other | Admitting: Pulmonary Disease

## 2017-02-15 ENCOUNTER — Encounter: Payer: Self-pay | Admitting: Pulmonary Disease

## 2017-02-15 VITALS — BP 100/60 | HR 79 | Ht 61.0 in | Wt 145.6 lb

## 2017-02-15 DIAGNOSIS — J449 Chronic obstructive pulmonary disease, unspecified: Secondary | ICD-10-CM | POA: Diagnosis not present

## 2017-02-15 DIAGNOSIS — F17210 Nicotine dependence, cigarettes, uncomplicated: Secondary | ICD-10-CM | POA: Diagnosis not present

## 2017-02-15 MED ORDER — TIOTROPIUM BROMIDE MONOHYDRATE 18 MCG IN CAPS: ORAL_CAPSULE | RESPIRATORY_TRACT | 11 refills | Status: DC

## 2017-02-15 MED ORDER — ALBUTEROL SULFATE HFA 108 (90 BASE) MCG/ACT IN AERS: 2.0000 | INHALATION_SPRAY | RESPIRATORY_TRACT | 2 refills | Status: DC | PRN

## 2017-02-15 NOTE — Patient Instructions (Signed)
Continue using her inhalers as prescribed. We will call in a refill for her medications Your CT of the chest looks good with no worrisome findings  Follow up in 1 year

## 2017-02-15 NOTE — Progress Notes (Signed)
Tasha [name]    [id number]    1950/09/01  Primary Care Physician:Copland, Gwenlyn Found, MD  Referring Physician: Pearline Cables, MD [address] [place], [place] [address]  Chief complaint:   Follow up for COPD GOLD C (CAT score 9, 2 exacerbations over the past year)  HPI: 66 year old with past medical history of asthma, hypertension, hypercholesterolemia,, left DVT. She has complains of dyspnea on exertion for the past several months. She does not have dyspnea at rest. She notes chronic productive cough with white mucus production, daily wheezing. She denies any fevers, chills, chest pain, palpitation.  She is an active smoker and smoked 1 pack per day for about 50 years. She quit drinking 30 years ago. 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 She does not note any exposures at work or at home. She was diagnosed with left DVT 4 years ago and treated with Xarelto for 6 months. Repeat ultrasound showed resolution of the DVT and she's been off antibiotic coagulation since then. She is on albuterol rescue inhaler. She is also taking Atrovent nebs and Spiriva on alternate days. She states that these inhalers and nebulizers to help with her breathing. She did not like Breo as it was not effective.     Interim history: She continues on Spiriva with stable symptoms. She hardly needs to use her rescue inhaler. No exacerbations or ER visits.  Outpatient Encounter Prescriptions as of 02/15/2017  Medication Sig  . albuterol (PROVENTIL HFA;VENTOLIN HFA) 108 (90 Base) MCG/ACT inhaler Inhale 1 puff into the lungs every 4 (four) hours as needed for wheezing or shortness of breath.  [name] aspirin 325 MG tablet Take 325 mg by mouth daily.  . cholecalciferol (VITAMIN D) 1000 UNITS tablet Take 2,000 Units by mouth daily.   . clonazePAM (KLONOPIN) 1 MG tablet TAKE ONE TABLET BY MOUTH DAILY AS NEEDED FOR ANXIETY   . escitalopram (LEXAPRO) 10 MG tablet Take 1  tablet (10 mg total) by mouth daily.  [name] ipratropium (ATROVENT) 0.02 % nebulizer solution Take 2.5 mLs (0.5 mg total) by nebulization 2 (two) times daily as needed for wheezing or shortness of breath.  . tiotropium (SPIRIVA HANDIHALER) 18 MCG inhalation capsule Place 1 capsule into inhaler and inhale daily.  . traZODone (DESYREL) 50 MG tablet TAKE HALF TO ONE TABLET BY MOUTH AT BEDTIME AS NEEDED FOR SLEEP  . [DISCONTINUED] Tiotropium Bromide Monohydrate (SPIRIVA RESPIMAT) 2.5 MCG/ACT AERS Inhale 2 puffs into the lungs daily.   No facility-administered encounter medications on file as of 02/15/2017.     Allergies as of 02/15/2017 - Review Complete 02/15/2017  Allergen Reaction Noted  . Acitretin Swelling and Rash 09/23/2011  . Percodan [oxycodone-aspirin]  05/20/2015  . Statins Other (See Comments) 04/17/2008    Past Medical History:  Diagnosis Date  . Abnormal nuclear stress test 06/10/2015  . Allergy   . Anxiety   . Arthritis    DDD lumbar; B thumbs  . Asthma    age 55  . Clotting disorder (HCC)   . Depression   . DVT (deep venous thrombosis) (HCC)   . GERD (gastroesophageal reflux disease)   . Hyperlipidemia   . Hypertension   . Psoriasiform dermatitis     Past Surgical History:  Procedure Laterality Date  . NOSE SURGERY    . TONSILLECTOMY    . TUBAL LIGATION      Family History  Problem Relation Age  of Onset  . Cancer Mother 38       colorectal cancer  . Stroke Mother 81       CVA  . Alcohol abuse Mother   . Diabetes Mother   . Hypertension Mother   . Heart disease Father   . Cancer Maternal Grandfather        lung  . Alcohol abuse Brother   . Heart attack Brother   . Cancer Maternal Aunt        breast  . Breast cancer Maternal Aunt     Social History   Social History  . Marital status: Single    Spouse name: N/A  . Number of children: N/A  . Years of education: N/A   Occupational History  . Not on file.   Social History Main Topics  . Smoking  status: Current Every Day Smoker    Packs/day: 0.25    Years: 46.00    Types: Cigarettes  . Smokeless tobacco: Never Used  . Alcohol use No  . Drug use: No  . Sexual activity: Not on file   Other Topics Concern  . Not on file   Social History Narrative   Marital status: single; not dating and not interested      Children: none      Lives: alone with dog      Employment:  Semi-retired; works a little bit; Air cabin crew since 1980; maintains one client      Tobacco: 1 ppd x 50 years      Alcohol: quit; IN [place]ERY SINCE 09/25/1985.  Attends AA twice weekly; still sponsors others.      Drugs:  None      Exercise:       ADLs: independent with ADLs; no assistant devices.      Advanced Directives:  Yes.  desires FULL CODE.   Review of systems: Review of Systems  Constitutional: Negative for fever and chills.  HENT: Negative.   Eyes: Negative for blurred vision.  Respiratory: as per HPI  Cardiovascular: Negative for chest pain and palpitations.  Gastrointestinal: Negative for vomiting, diarrhea, blood per rectum. Genitourinary: Negative for dysuria, urgency, frequency and hematuria.  Musculoskeletal: Negative for myalgias, back pain and joint pain.  Skin: Negative for itching and rash.  Neurological: Negative for dizziness, tremors, focal weakness, seizures and loss of consciousness.  Endo/Heme/Allergies: Negative for environmental allergies.  Psychiatric/Behavioral: Negative for depression, suicidal ideas and hallucinations.  All other systems reviewed and are negative.  Physical Exam: Blood pressure 100/60, pulse 79, height  (1.549 m), weight 145 lb 9.6 oz (66 kg), SpO2 90 %. Gen:      No acute distress HEENT:  EOMI, sclera anicteric Neck:     No masses; no thyromegaly Lungs:    Clear to auscultation bilaterally; normal respiratory effort CV:         Regular rate and rhythm; no murmurs Abd:      + bowel sounds; soft, non-tender; no palpable masses, no  distension Ext:    No edema; adequate peripheral perfusion Skin:      Warm and dry; no rash Neuro: alert and oriented x 3 Psych: normal mood and affect  Data Reviewed: PFTs 04/08/16 FVC 1.80 [69%), FEV1 1.2 to [61%), F/F 68 Moderate obstructive defect.  PFTs 05/13/16 FVC 2.20 (80%), FEV1 1.40 (68%), F/F 64, TLC 110%, RV/TLC 134%, DLCO 77% Moderate-severe obstruction with significant reversibility, Air trapping Mild diffusion defect  Chest x-ray 04/14/15-no active cardiopulmonary disease. Chest x-ray 05/11/16-  no active cardiopulmonary disease. Screening CT chest 09/10/16-mild emphysema, mild right middle lobe, left upper lobe scarring. I reviewed all images personally.  FENO 05/12/15- 6 IgE 05/11/16- 106  Assessment:  #1 COPD GOLD C Continues on Spiriva and albuterol. PFTs show reversibility. We discussed addition of inhaled corticosteroids and LABA but she did not like Breo and prefers to continue on just the Spiriva. There is a question of asthma but FENO, IgE levels are low  #2 Active smoker Smoking cessation discussed again. Time spent counseling- 5 mins Low-dose screening CT reviewed with no suspicious findings. There is mild scarring in the lungs but no evidence of ILD  Plan/Recommendations: - Continue Spiriva - Smoking cessation - Annual low dose screening CT.   Chilton Greathouse MD [facility] Pager [phone] 02/15/2017, 4:39 PM  CC: Copland, Gwenlyn Found, MD

## 2017-02-16 DIAGNOSIS — M9901 Segmental and somatic dysfunction of cervical region: Secondary | ICD-10-CM | POA: Diagnosis not present

## 2017-02-16 DIAGNOSIS — M4722 Other spondylosis with radiculopathy, cervical region: Secondary | ICD-10-CM | POA: Diagnosis not present

## 2017-02-21 DIAGNOSIS — M9901 Segmental and somatic dysfunction of cervical region: Secondary | ICD-10-CM | POA: Diagnosis not present

## 2017-02-21 DIAGNOSIS — M4722 Other spondylosis with radiculopathy, cervical region: Secondary | ICD-10-CM | POA: Diagnosis not present

## 2017-02-23 DIAGNOSIS — M9901 Segmental and somatic dysfunction of cervical region: Secondary | ICD-10-CM | POA: Diagnosis not present

## 2017-02-23 DIAGNOSIS — L2089 Other atopic dermatitis: Secondary | ICD-10-CM | POA: Diagnosis not present

## 2017-02-23 DIAGNOSIS — L0109 Other impetigo: Secondary | ICD-10-CM | POA: Diagnosis not present

## 2017-02-23 DIAGNOSIS — M4722 Other spondylosis with radiculopathy, cervical region: Secondary | ICD-10-CM | POA: Diagnosis not present

## 2017-02-24 DIAGNOSIS — L2089 Other atopic dermatitis: Secondary | ICD-10-CM | POA: Diagnosis not present

## 2017-02-24 DIAGNOSIS — L821 Other seborrheic keratosis: Secondary | ICD-10-CM | POA: Diagnosis not present

## 2017-02-28 DIAGNOSIS — M9901 Segmental and somatic dysfunction of cervical region: Secondary | ICD-10-CM | POA: Diagnosis not present

## 2017-02-28 DIAGNOSIS — M4722 Other spondylosis with radiculopathy, cervical region: Secondary | ICD-10-CM | POA: Diagnosis not present

## 2017-03-02 DIAGNOSIS — M9901 Segmental and somatic dysfunction of cervical region: Secondary | ICD-10-CM | POA: Diagnosis not present

## 2017-03-02 DIAGNOSIS — M4722 Other spondylosis with radiculopathy, cervical region: Secondary | ICD-10-CM | POA: Diagnosis not present

## 2017-04-01 DIAGNOSIS — Z23 Encounter for immunization: Secondary | ICD-10-CM | POA: Diagnosis not present

## 2017-05-10 ENCOUNTER — Other Ambulatory Visit: Payer: Self-pay | Admitting: Family Medicine

## 2017-05-10 DIAGNOSIS — G479 Sleep disorder, unspecified: Secondary | ICD-10-CM

## 2017-06-16 ENCOUNTER — Other Ambulatory Visit: Payer: Self-pay | Admitting: Pulmonary Disease

## 2017-06-24 ENCOUNTER — Other Ambulatory Visit: Payer: Self-pay | Admitting: Family Medicine

## 2017-06-24 DIAGNOSIS — Z139 Encounter for screening, unspecified: Secondary | ICD-10-CM

## 2017-06-27 ENCOUNTER — Encounter: Payer: Self-pay | Admitting: Family Medicine

## 2017-06-27 ENCOUNTER — Ambulatory Visit (INDEPENDENT_AMBULATORY_CARE_PROVIDER_SITE_OTHER): Payer: Medicare Other | Admitting: Family Medicine

## 2017-06-27 VITALS — BP 140/88 | HR 88 | Temp 98.5°F | Ht 61.0 in | Wt 148.4 lb

## 2017-06-27 DIAGNOSIS — L409 Psoriasis, unspecified: Secondary | ICD-10-CM | POA: Diagnosis not present

## 2017-06-27 MED ORDER — CEPHALEXIN 500 MG PO CAPS: 500.0000 mg | ORAL_CAPSULE | Freq: Two times a day (BID) | ORAL | 0 refills | Status: DC

## 2017-06-27 NOTE — Patient Instructions (Signed)
It was good to see you again today- we are going to try and get your fingers healed up Please use the keflex twice a day for 10 days Keep your fingertips moisturized with the mupriocin and/ or triamcinolone (use these twice a day) In between times use aquaphor or vaseline Try to wear gloves when you can to keep the fingers protected and moisturized.    Please let me know if your fingers are not getting better in the next few days

## 2017-06-27 NOTE — Progress Notes (Signed)
[facility] [address], [address] [place], [place] [address] [phone]  Date:  06/27/2017   Name:  Tasha [name]   DOB:  [birth year]   MRN:  [id number]  PCP:  Pearline Cables, MD    Chief Complaint: Follow-up (c/o cut on fingers on both hands due to auto immune disorder. )   History of Present Illness:  Tasha [name] is a 67 y.o. very pleasant female patient who presents with the following:  History of COPD, high cholesterol, HTN Here today with concern of recurrent severe dryness on her fingers. She had a bx long ago per Dr. Nicholas Lose and has psoriaform dermatitits.   She had worsening of her stress level lately and thinks that this may have worsened her sx She has used keflex for this in the past and it seemed to help, and she also has used mupriocin, triamcinolone 0.1%  Her hands got this bad 3 weeks ago   She had a bx back in 2011; MICROSCOPIC DESCRIPTION 1. There is regular acanthosis with spongiosis and parakeratosis. Infiltrates of lymphocytes and eosinophils are PRESENT within the dermis. The pattern is that of a chronic spongiotic process. These changes may be seen in a variety of settings, including contact dermatitis, nummular dermatitis and also atopic dermatitis. Seborrheic dermatitis is also a consideration histologically. A special stain (digested pas) is performed in order to identify fungal hyphae in this material. The stain is negative. No diagnostic changes of psoriasis are noted.  Last seen by myself in April of 2018:  Here today to discuss trouble sleeping She notes that she geneally takes a long time to get to sleep- between 1.5 and 3 hours to get to sleep. She experiences "jerks" where her body will jerk suddenly as she is falling asleep- this will wake her up again She has used very occasional klonopin but would like to have something non- addictive if possible.  She has most of her last rx  of klonopin and asks me to dispose of it  She has noted more difficulty sleeping for about 6 months  She has been sober from alcohol for over 30 years.  She proudly shows me he 30 year AA chip today She has been stable on her lexapro for a long time- she is on 10 mg currently.  She wonders if she still needs this medication and would be interested in decreasing the dosage She is taking aspirin daily and a vitamin D supplement She is on lisinopril but only takes 10 mg every 2-3 days. She has been checking her BP at home on a regular basis and her readings are excellent on this low dose.  She would like to D/C lisinopril if possible  Patient Active Problem List   Diagnosis Date Noted  . COPD, group C, by GOLD 2017 classification (HCC) 08/24/2016  . Nut allergy 09/03/2015  . Abnormal nuclear stress test 06/10/2015  . Family history of colorectal cancer 05/27/2015  . Tobacco abuse 05/27/2015  . 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) 04/28/2015  . Pure hypercholesterolemia 04/28/2015  . Essential hypertension 04/24/2014  . DVT (deep venous thrombosis) (HCC) 03/10/2012  . 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 HYPERGLYCEMIA 02/10/2009  . ENDOMETRIAL POLYP 10/01/2008  . HYPERLIPIDEMIA 04/17/2008  . DEPRESSION 04/17/2008  . ASTHMA 04/17/2008  . SCOLIOSIS 04/17/2008  . COLONIC POLYPS, HX OF 04/17/2008    Past Medical History:  Diagnosis Date  . Abnormal nuclear stress test 06/10/2015  .  Allergy   . Anxiety   . Arthritis    DDD lumbar; B thumbs  . Asthma    age 67  . Clotting disorder (HCC)   . Depression   . DVT (deep venous thrombosis) (HCC)   . GERD (gastroesophageal reflux disease)   . Hyperlipidemia   . Hypertension   . Psoriasiform dermatitis     Past Surgical History:  Procedure Laterality Date  . NOSE SURGERY    . TONSILLECTOMY    . TUBAL LIGATION      Social History   Tobacco Use  . Smoking status: Current Every Day Smoker    Packs/day: 0.25    Years: 46.00    Pack  years: 11.50    Types: Cigarettes  . Smokeless tobacco: Never Used  Substance Use Topics  . Alcohol use: No  . Drug use: No    Family History  Problem Relation Age of Onset  . Cancer Mother [id number]       colorectal cancer  . Stroke Mother [id number]       CVA  . Alcohol abuse Mother   . Diabetes Mother   . Hypertension Mother   . Heart disease Father   . Cancer Maternal Grandfather        lung  . Alcohol abuse Brother   . Heart attack Brother   . Cancer Maternal Aunt        breast  . Breast cancer Maternal Aunt     Allergies  Allergen Reactions  . Acitretin Swelling and Rash  . Percodan [Oxycodone-Aspirin]   . Statins Other (See Comments)    Muscle aches in legs     Medication list has been reviewed and updated.  Current Outpatient Medications on File Prior to Visit  Medication Sig Dispense Refill  . aspirin 325 MG tablet Take 325 mg by mouth daily.    . cholecalciferol (VITAMIN D) 1000 UNITS tablet Take 2,000 Units by mouth daily.     . clonazePAM (KLONOPIN) 1 MG tablet TAKE ONE TABLET BY MOUTH DAILY AS NEEDED FOR ANXIETY  20 tablet 1  . escitalopram (LEXAPRO) 10 MG tablet Take 1 tablet (10 mg total) by mouth daily. 90 tablet 3  . ipratropium (ATROVENT) 0.02 % nebulizer solution Take 2.5 mLs (0.5 mg total) by nebulization 2 (two) times daily as needed for wheezing or shortness of breath. 75 mL 12  . tiotropium (SPIRIVA HANDIHALER) 18 MCG inhalation capsule Place 1 capsule into inhaler and inhale daily. 30 capsule 11  . traZODone (DESYREL) 50 MG tablet TAKE 1/2 TO 1 TABLET BY MOUTH DAILY AT BEDTIME AS NEEDED FOR SLEEP  30 tablet 2  . VENTOLIN HFA 108 (90 Base) MCG/ACT inhaler inhale 2 puffs by mouth every 4 hours as needed for wheezing or shortness of breath 18 g 5   No current facility-administered medications on file prior to visit.     Review of Systems:  As per HPI- otherwise negative.   Physical Examination: Vitals:   06/27/17 1233  BP: 140/88  Pulse: 88  Temp:  98.5 F (36.9 C)  SpO2: 94%   Vitals:   06/27/17 1233  Weight: 148 lb 6.4 oz (67.3 kg)  Height: 5\' 1"  (1.549 m)   Body mass index is 28.04 kg/m. Ideal Body Weight: Weight in (lb) to have BMI = 25: 132  GEN: WDWN, NAD, Non-toxic, A & O x 3, overweight, looks well  HEENT: Atraumatic, Normocephalic. Neck supple. No masses, No LAD. Ears and Nose: No external  deformity. CV: RRR, No M/G/R. No JVD. No thrill. No extra heart sounds. PULM: CTA B, no wheezes, crackles, rhonchi. No retractions. No resp. distress. No accessory muscle use. EXTR: No c/c/e NEURO Normal gait.  PSYCH: Normally interactive. Conversant. Not depressed or anxious appearing.  Calm demeanor.  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  She has had success with keflex in the past Will also have her use triamcinolone and mupirocin ointment BID, avoid water, and try wearing gloves to allow emollient to penetrate.   She will let me know if not better in a few days    Signed Abbe Amsterdam, MD

## 2017-07-27 ENCOUNTER — Ambulatory Visit
Admission: RE | Admit: 2017-07-27 | Discharge: 2017-07-27 | Disposition: A | Discharge status: Home / Self Care | Payer: Medicare Other | Source: Ambulatory Visit | Attending: Family Medicine | Admitting: Family Medicine

## 2017-07-27 DIAGNOSIS — Z139 Encounter for screening, unspecified: Secondary | ICD-10-CM

## 2017-07-27 DIAGNOSIS — Z1231 Encounter for screening mammogram for malignant neoplasm of breast: Secondary | ICD-10-CM | POA: Diagnosis not present

## 2017-08-02 ENCOUNTER — Telehealth: Payer: Self-pay | Admitting: Pulmonary Disease

## 2017-08-02 MED ORDER — TIOTROPIUM BROMIDE MONOHYDRATE 2.5 MCG/ACT IN AERS: 2.0000 | INHALATION_SPRAY | Freq: Every day | RESPIRATORY_TRACT | 6 refills | Status: DC

## 2017-08-02 NOTE — Telephone Encounter (Signed)
  ATC pt, no answer. Left message for pt to call back. In the meantime, PM ok with switching to respimat?   tiotropium (SPIRIVA HANDIHALER) 18 MCG inhalation capsule [[id number]][[id number]]  Order Details  Dose, Route, Frequency: As Directed   Dispense Quantity: 30 capsule Refills: 11 Fills remaining: --        Sig: Place 1 capsule into inhaler and inhale daily.       Written Date: 02/15/17 Expiration Date: 02/15/18    Start Date: 02/15/17 End Date: --         Ordering Provider:  Chilton GreathouseMannam, Praveen, MD DEA #:  [id number] NPI:  [id number]

## 2017-08-02 NOTE — Telephone Encounter (Signed)
Yes. Ok with changing to spiriva respimat 2.5 mcg

## 2017-08-02 NOTE — Telephone Encounter (Signed)
Pt is returning call. Cb is [phone]

## 2017-08-02 NOTE — Telephone Encounter (Signed)
Pt is aware of below message and voiced her understanding. Rx for Spiriva 2.5 has been sent to preferred pharmacy. Nothing further is needed.

## 2017-08-19 ENCOUNTER — Other Ambulatory Visit: Payer: Self-pay | Admitting: Family Medicine

## 2017-08-19 DIAGNOSIS — G479 Sleep disorder, unspecified: Secondary | ICD-10-CM

## 2017-08-19 NOTE — Telephone Encounter (Signed)
Received refill request for traZODone (DESYREL) 50 MG tablet. Last office visit 06/27/17 and last refill 05/12/17.

## 2017-08-25 DIAGNOSIS — L2089 Other atopic dermatitis: Secondary | ICD-10-CM | POA: Diagnosis not present

## 2017-09-12 ENCOUNTER — Ambulatory Visit (INDEPENDENT_AMBULATORY_CARE_PROVIDER_SITE_OTHER)
Admission: RE | Admit: 2017-09-12 | Discharge: 2017-09-12 | Disposition: A | Discharge status: Home / Self Care | Payer: Medicare Other | Source: Ambulatory Visit | Attending: Acute Care | Admitting: Acute Care

## 2017-09-12 DIAGNOSIS — F17210 Nicotine dependence, cigarettes, uncomplicated: Secondary | ICD-10-CM

## 2017-09-14 ENCOUNTER — Other Ambulatory Visit: Payer: Self-pay | Admitting: Acute Care

## 2017-09-14 DIAGNOSIS — F17210 Nicotine dependence, cigarettes, uncomplicated: Principal | ICD-10-CM

## 2017-09-14 DIAGNOSIS — Z122 Encounter for screening for malignant neoplasm of respiratory organs: Secondary | ICD-10-CM

## 2017-09-15 DIAGNOSIS — L2089 Other atopic dermatitis: Secondary | ICD-10-CM | POA: Diagnosis not present

## 2017-10-07 DIAGNOSIS — L308 Other specified dermatitis: Secondary | ICD-10-CM | POA: Diagnosis not present

## 2017-10-07 DIAGNOSIS — L309 Dermatitis, unspecified: Secondary | ICD-10-CM | POA: Diagnosis not present

## 2017-10-07 DIAGNOSIS — R21 Rash and other nonspecific skin eruption: Secondary | ICD-10-CM | POA: Diagnosis not present

## 2017-10-11 ENCOUNTER — Ambulatory Visit (INDEPENDENT_AMBULATORY_CARE_PROVIDER_SITE_OTHER): Payer: Medicare Other | Admitting: Urgent Care

## 2017-10-11 ENCOUNTER — Encounter: Payer: Self-pay | Admitting: Urgent Care

## 2017-10-11 VITALS — BP 153/103 | HR 91 | Temp 97.8°F | Ht 61.0 in | Wt 142.6 lb

## 2017-10-11 DIAGNOSIS — J441 Chronic obstructive pulmonary disease with (acute) exacerbation: Secondary | ICD-10-CM | POA: Diagnosis not present

## 2017-10-11 DIAGNOSIS — R0602 Shortness of breath: Secondary | ICD-10-CM | POA: Diagnosis not present

## 2017-10-11 DIAGNOSIS — J9601 Acute respiratory failure with hypoxia: Secondary | ICD-10-CM

## 2017-10-11 MED ORDER — ALBUTEROL SULFATE HFA 108 (90 BASE) MCG/ACT IN AERS: INHALATION_SPRAY | RESPIRATORY_TRACT | 5 refills | Status: DC

## 2017-10-11 MED ORDER — IPRATROPIUM BROMIDE 0.02 % IN SOLN
0.5000 mg | Freq: Once | RESPIRATORY_TRACT | Status: AC
  Administered 2017-10-11: 0.5 mg via RESPIRATORY_TRACT

## 2017-10-11 MED ORDER — LEVALBUTEROL HCL 0.63 MG/3ML IN NEBU
1.2500 mg | INHALATION_SOLUTION | Freq: Once | RESPIRATORY_TRACT | Status: AC
Start: 2017-10-11 — End: 2017-10-11
  Administered 2017-10-11: 1.25 mg via RESPIRATORY_TRACT

## 2017-10-11 MED ORDER — ALBUTEROL SULFATE (2.5 MG/3ML) 0.083% IN NEBU: 2.5000 mg | INHALATION_SOLUTION | RESPIRATORY_TRACT | 1 refills | Status: DC | PRN

## 2017-10-11 NOTE — Progress Notes (Signed)
   MRN: [id number] DOB: 01/01/1951  Subjective:   Tasha [name] is a 66 y.o. female with pmh of COPD, asthma presenting for acute onset of shortness of breath today. She has tried her albuterol inhaler 6 times at home. Denies fever, confusion, chest pain, wheezing, n/v, abdominal pain. She does take Spiriva twice daily. She is currently smoking 1/4 pack per day. She is not on any home oxygen. She does not have nebulized albuterol at home, has a nebulizer though.   Tasha [name] has a current medication list which includes the following prescription(s): aspirin, cholecalciferol, escitalopram, tiotropium bromide monohydrate, trazodone, ventolin hfa, and ipratropium. Also is allergic to acitretin; percodan [oxycodone-aspirin]; and statins.  Tasha [name]  has a past medical history of Abnormal nuclear stress test (06/10/2015), Allergy, Anxiety, Arthritis, Asthma, Clotting disorder (HCC), Depression, DVT (deep venous thrombosis) (HCC), GERD (gastroesophageal reflux disease), Hyperlipidemia, Hypertension, and Psoriasiform dermatitis. Also  has a past surgical history that includes Nose surgery; Tonsillectomy; and Tubal ligation.  Objective:   Vitals: BP (!) 153/103 (BP Location: Right Arm, Patient Position: Sitting, Cuff Size: Normal)   Pulse 91   Temp 97.8 F (36.6 C) (Oral)   Ht  (1.549 m)   Wt 142 lb 9.6 oz (64.7 kg)   SpO2 93% Comment: on 2L of oxygen  BMI 26.94 kg/m   BP Readings from Last 3 Encounters:  10/11/17 (!) 153/103  06/27/17 140/88  02/15/17 100/60   Patient had pulse oximetry of 86% on triage and came up to 93% on 2 liters of oxygen.  Pulse oximetry remained at 93% s/p dual nebulizer treatment with levalbuterol and ipratropium and still on 2L of oxygen.  Physical Exam  Constitutional: She is oriented to person, place, and time. She appears well-developed and well-nourished.  HENT:  Mouth/Throat: Oropharynx is clear and moist.  Cardiovascular: Normal rate, regular rhythm and intact  distal pulses. Exam reveals no gallop and no friction rub.  No murmur heard. Pulmonary/Chest: No respiratory distress. She has decreased breath sounds. She has no wheezes. She has no rhonchi. She has no rales.  Neurological: She is alert and oriented to person, place, and time.  Psychiatric:  Flat affect, tearful.   Assessment and Plan :   Shortness of breath - Plan: levalbuterol (XOPENEX) nebulizer solution 1.25 mg, ipratropium (ATROVENT) nebulizer solution 0.5 mg  Chronic obstructive pulmonary disease with acute exacerbation (HCC)  Acute respiratory failure with hypoxia [facility])  Case precepted with Dr. Alvy Bimler. Patient was very resistant to going to the ER. She also refused transport by ambulance. I counseled extensively on complications of COPD exacerbation and respiratory failure. Patient still refused to report to the hospital. She left our clinic amicably.   Wallis Bamberg, PA-C Primary Care at [facility] [phone] 10/11/2017  4:47 PM

## 2017-10-11 NOTE — Patient Instructions (Addendum)
Please report to the ER immediately as you are having a COPD exacerbation with respiratory failure. Please do not go home and try to manage this on your own.    Hypoxia Hypoxia is a condition that happens when there is a lack of oxygen in the body's tissues and organs. When there is not enough oxygen, organs cannot work as they should. This causes serious problems throughout the body and in the brain. What are the causes? This condition may be caused by:  Exposure to high altitude.  A collapsed lung (pneumothorax).  Lung infection (pneumonia).  Lung injury.  Long-term (chronic) lung disease, such as COPD (chronic obstructive pulmonary disease).  Blood collecting in the chest cavity (hemothorax).  Food, saliva, or vomit getting into the airway (aspiration).  Reduced blood flow (ischemia).  Severe blood loss.  Slow or shallow breathing (hypoventilation).  Blood disorders, such as anemia.  Carbon monoxide poisoning.  The heart suddenly stopping (cardiac arrest).  Anesthetic medicines.  Drowning.  Choking.  What are the signs or symptoms? Symptoms of this condition include:  Headache.  Fatigue.  Drowsiness.  Forgetfulness.  Nausea.  Confusion.  Shortness of breath.  Dizziness.  Bluish color of the skin, lips, or nail beds (cyanosis).  Change in consciousness or awareness.  If hypoxia is not treated, it can lead to convulsions, loss of consciousness (coma), or brain damage. How is this diagnosed? This condition may be diagnosed based on:  A physical exam.  Blood tests.  A test that measures how much oxygen is in your blood (pulse oximetry). This is done with a sensor that is placed on your finger, toe, or earlobe.  Chest X-ray.  Tests to check your lung function (pulmonary function tests).  A test to check the electrical activity of your heart (electrocardiogram, ECG).  You may have other tests to determine the cause of your hypoxia. How is  this treated? Treatment for this condition depends on what is causing the hypoxia. You will likely be treated with oxygen therapy. This may be done by giving you oxygen through a face mask or through tubes in your nose. Your health care provider may also recommend other therapies to treat the underlying cause of your hypoxia. Follow these instructions at home:  Take over-the-counter and prescription medicines only as told by your health care provider.  Do not use any products that contain nicotine or tobacco, such as cigarettes and e-cigarettes. If you need help quitting, ask your health care provider.  Avoid secondhand smoke.  Work with your health care provider to manage any chronic conditions you have that may be causing hypoxia, such as COPD.  Keep all follow-up visits as told by your health care provider. This is important. Contact a health care provider if:  You have a fever.  You have trouble breathing, even after treatment.  You become extremely short of breath when you exercise. Get help right away if:  Your shortness of breath gets worse, especially with normal or very little activity.  Your skin, lips, or nail beds have a bluish color.  You become confused or you cannot think properly.  You have chest pain. Summary  Hypoxia is a condition that happens when there is a lack of oxygen in the body's tissues and organs.  If hypoxia is not treated, it can lead to convulsions, loss of consciousness (coma), or brain damage.  Symptoms of hypoxia can include a headache, shortness of breath, confusion, nausea, and a bluish skin color.  Hypoxia has  many possible causes, including exposure to high altitude, carbon monoxide poisoning, or other health issues, such as blood disorders or cardiac arrest.  Hypoxia is usually treated with oxygen therapy. This information is not intended to replace advice given to you by your health care provider. Make sure you discuss any questions  you have with your health care provider. Document Released: 07/05/2016 Document Revised: 07/05/2016 Document Reviewed: 07/05/2016 Elsevier Interactive Patient Education  2018 [employer].

## 2017-11-05 ENCOUNTER — Other Ambulatory Visit: Payer: Self-pay | Admitting: Family Medicine

## 2017-11-05 DIAGNOSIS — F411 Generalized anxiety disorder: Secondary | ICD-10-CM

## 2017-11-16 DIAGNOSIS — L2089 Other atopic dermatitis: Secondary | ICD-10-CM | POA: Diagnosis not present

## 2017-11-27 NOTE — Progress Notes (Signed)
[facility] [address] [place], [place] [address] [phone]  Date:  11/28/2017   Name:  Tasha [name]   DOB:  03-11-1951   MRN:  [id number]  PCP:  Pearline Cables, MD    Chief Complaint: Hypertension (follow up appointment); Hyperlipidemia; Asthma; COPD; and Hypnic Jerks   History of Present Illness:  Tasha [name] is a 67 y.o. very pleasant female patient who presents with the following:  Following up with me today- history of COPD, HTN, hyperlipidemia, asthma  Her pulmonologist is with [facility], Dr. Alvia Grove - last saw him in the fall: Assessment:  #1 COPD GOLD C Continues on Spiriva and albuterol. PFTs show reversibility. We discussed addition of inhaled corticosteroids and LABA but she did not like Breo and prefers to continue on just the Spiriva. There is a question of asthma but FENO, IgE levels are low #2 Active smoker Smoking cessation discussed again. Time spent counseling- 5 mins Low-dose screening CT reviewed with no suspicious findings. There is mild scarring in the lungs but no evidence of ILD Plan/Recommendations: - Continue Spiriva - Smoking cessation - Annual low dose screening CT.  She is not on BP meds She does not do as well during the hot time of the year- her breathing is more of a problem She is on lexapro and feels like it is working well for her, wishes to continue to use this  She "feels like my nerves are shot, I don't know if I need to be taking anything."  Reports that she has not felt like herself, she has been under some financial pressures  A friend came over and helped her with a recent panic attack- they prayed together and this did seem to help some  She also notes that while she is trying to go to sleep she will have leg jerks that wake her up  Once she is asleep she is ok  She is using lexapro and also trazodone at bedtime 50 mg of trazodone No SI  She is NOT fasting  currently had part of of protein drink  BP Readings from Last 3 Encounters:  11/28/17 136/80  10/11/17 (!) 153/103  06/27/17 140/88   Lipids:    Component Value Date/Time   CHOL 249 (H) 04/28/2015 0918   TRIG 69 04/28/2015 0918   HDL 65 04/28/2015 0918   VLDL 14 04/28/2015 0918   CHOLHDL 3.8 04/28/2015 0918    Patient Active Problem List   Diagnosis Date Noted  . COPD, group C, by GOLD 2017 classification (HCC) 08/24/2016  . Nut allergy 09/03/2015  . Abnormal nuclear stress test 06/10/2015  . Family history of colorectal cancer 05/27/2015  . Tobacco abuse 05/27/2015  . 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) 04/28/2015  . Pure hypercholesterolemia 04/28/2015  . Essential hypertension 04/24/2014  . DVT (deep venous thrombosis) (HCC) 03/10/2012  . 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 HYPERGLYCEMIA 02/10/2009  . ENDOMETRIAL POLYP 10/01/2008  . HYPERLIPIDEMIA 04/17/2008  . DEPRESSION 04/17/2008  . ASTHMA 04/17/2008  . SCOLIOSIS 04/17/2008  . COLONIC POLYPS, HX OF 04/17/2008    Past Medical History:  Diagnosis Date  . Abnormal nuclear stress test 06/10/2015  . Allergy   . Anxiety   . Arthritis    DDD lumbar; B thumbs  . Asthma    age 56  . Clotting disorder (HCC)   . Depression   . DVT (deep venous thrombosis) (HCC)   . GERD (  gastroesophageal reflux disease)   . Hyperlipidemia   . Hypertension   . Psoriasiform dermatitis     Past Surgical History:  Procedure Laterality Date  . NOSE SURGERY    . TONSILLECTOMY    . TUBAL LIGATION      Social History   Tobacco Use  . Smoking status: Current Every Day Smoker    Packs/day: 0.25    Years: 46.00    Pack years: 11.50    Types: Cigarettes  . Smokeless tobacco: Never Used  Substance Use Topics  . Alcohol use: No  . Drug use: No    Family History  Problem Relation Age of Onset  . Cancer Mother 672       colorectal cancer  . Stroke Mother 970       CVA  . Alcohol abuse Mother   . Diabetes Mother   . Hypertension  Mother   . Heart disease Father   . Cancer Maternal Grandfather        lung  . Alcohol abuse Brother   . Heart attack Brother   . Cancer Maternal Aunt        breast  . Breast cancer Maternal Aunt 75    Allergies  Allergen Reactions  . Acitretin Swelling and Rash  . Percodan [Oxycodone-Aspirin]   . Statins Other (See Comments)    Muscle aches in legs     Medication list has been reviewed and updated.  Current Outpatient Medications on File Prior to Visit  Medication Sig Dispense Refill  . albuterol (PROVENTIL) (2.5 MG/3ML) 0.083% nebulizer solution Take 3 mLs (2.5 mg total) by nebulization every 4 (four) hours as needed for wheezing or shortness of breath. 150 mL 1  . albuterol (VENTOLIN HFA) 108 (90 Base) MCG/ACT inhaler inhale 2 puffs by mouth every 4 hours as needed for wheezing or shortness of breath 18 g 5  . aspirin 325 MG tablet Take 325 mg by mouth daily.    . betamethasone dipropionate (DIPROLENE) 0.05 % cream   0  . cholecalciferol (VITAMIN D) 1000 UNITS tablet Take 2,000 Units by mouth daily.     [name]. escitalopram (LEXAPRO) 10 MG tablet TAKE ONE TABLET BY MOUTH ONE TIME DAILY  30 tablet 0  . ipratropium (ATROVENT) 0.02 % nebulizer solution Take 2.5 mLs (0.5 mg total) by nebulization 2 (two) times daily as needed for wheezing or shortness of breath. 75 mL 12  . Tiotropium Bromide Monohydrate (SPIRIVA RESPIMAT) 2.5 MCG/ACT AERS Inhale 2 puffs into the lungs daily. 1 Inhaler 6  . traZODone (DESYREL) 50 MG tablet take 1/2 to 1 tablet by mouth at bedtime as needed for sleep 90 tablet 2   No current facility-administered medications on file prior to visit.     Review of Systems:  As per HPI- otherwise negative. No CP or SOB  Physical Examination: Vitals:   11/28/17 1000  BP: 136/80  Pulse: 76  Resp: 20  Temp: 98.8 F (37.1 C)  SpO2: 93%   Vitals:   11/28/17 1000  Weight: 148 lb (67.1 kg)  Height: 5\' 1"  (1.549 m)   Body mass index is 27.96 kg/m. Ideal Body  Weight: Weight in (lb) to have BMI = 25: 132  GEN: WDWN, NAD, Non-toxic, A & O x 3, overweight, looks well HEENT: Atraumatic, Normocephalic. Neck supple. No masses, No LAD.  Bilateral TM wnl, oropharynx normal.  PEERL,EOMI.   Ears and Nose: No external deformity. CV: RRR, No M/G/R. No JVD. No thrill. No extra  heart sounds. PULM: CTA B, no wheezes, crackles, rhonchi. No retractions. No resp. distress. No accessory muscle use. EXTR: No c/c/e NEURO Normal gait.  PSYCH: Normally interactive. Conversant. Not depressed or anxious appearing.  Calm demeanor.    Assessment and Plan: Essential hypertension - Plan: Comprehensive metabolic panel  Chronic obstructive pulmonary disease with acute exacerbation (HCC)  Difficulty sleeping  Screening for deficiency anemia - Plan: CBC, Ferritin  Screening for diabetes mellitus - Plan: Hemoglobin A1c  Screening for hyperlipidemia - Plan: Lipid panel  Generalized anxiety disorder - Plan: propranolol (INDERAL) 10 MG tablet  Elevated glucose - Plan: Comprehensive metabolic panel, Hemoglobin A1c  Leukocytosis, unspecified type - Plan: CBC  Following up today She is having more difficulty sleeping Will increase trazodone to 75 mg- she will let me know how this works.  Discussed serotonin syndrome whit her today  Will have her try propranolol for shakiness, anxiety- she is a recovering alcoholic so we avoid benzos with her.  She will let me know how this works  Her BP is borderline so this may be helpful with her BP as well  Will plan further follow- up pending labs.   Signed Abbe Amsterdam, MD  Received her labs, letter to pt   Results for orders placed or performed in visit on 11/28/17  CBC  Result Value Ref Range   WBC 11.3 (H) 4.0 - 10.5 K/uL   RBC 4.68 3.87 - 5.11 Mil/uL   Platelets 328.0 150.0 - 400.0 K/uL   Hemoglobin 14.5 12.0 - 15.0 g/dL   HCT 16.1 09.6 - 04.5 %   MCV 93.6 78.0 - 100.0 fl   MCHC 33.1 30.0 - 36.0 g/dL   RDW 40.9  81.1 - 91.4 %  Comprehensive metabolic panel  Result Value Ref Range   Sodium 142 135 - 145 mEq/L   Potassium 4.4 3.5 - 5.1 mEq/L   Chloride 108 96 - 112 mEq/L   CO2 28 19 - 32 mEq/L   Glucose, Bld 96 70 - 99 mg/dL   BUN 26 (H) 6 - 23 mg/dL   Creatinine, Ser 7.82 0.40 - 1.20 mg/dL   Total Bilirubin 0.4 0.2 - 1.2 mg/dL   Alkaline Phosphatase 75 39 - 117 U/L   AST 19 0 - 37 U/L   ALT 18 0 - 35 U/L   Total Protein 6.5 6.0 - 8.3 g/dL   Albumin 4.1 3.5 - 5.2 g/dL   Calcium 9.6 8.4 - 95.6 mg/dL   GFR 21.30 >86.57 mL/min  Hemoglobin A1c  Result Value Ref Range   Hgb A1c MFr Bld 6.4 4.6 - 6.5 %  Lipid panel  Result Value Ref Range   Cholesterol 241 (H) 0 - 200 mg/dL   Triglycerides 84.6 0.0 - 149.0 mg/dL   HDL 96.29 >52.84 mg/dL   VLDL 13.2 0.0 - 44.0 mg/dL   LDL Cholesterol 102 (H) 0 - 99 mg/dL   Total CHOL/HDL Ratio 3    NonHDL 165.80   Ferritin  Result Value Ref Range   Ferritin 49.9 10.0 - 291.0 ng/mL

## 2017-11-28 ENCOUNTER — Encounter: Payer: Self-pay | Admitting: Family Medicine

## 2017-11-28 ENCOUNTER — Ambulatory Visit (INDEPENDENT_AMBULATORY_CARE_PROVIDER_SITE_OTHER): Payer: Medicare Other | Admitting: Family Medicine

## 2017-11-28 VITALS — BP 136/80 | HR 76 | Temp 98.8°F | Resp 20 | Ht 61.0 in | Wt 148.0 lb

## 2017-11-28 DIAGNOSIS — Z13220 Encounter for screening for lipoid disorders: Secondary | ICD-10-CM

## 2017-11-28 DIAGNOSIS — F411 Generalized anxiety disorder: Secondary | ICD-10-CM | POA: Diagnosis not present

## 2017-11-28 DIAGNOSIS — I10 Essential (primary) hypertension: Secondary | ICD-10-CM

## 2017-11-28 DIAGNOSIS — R7309 Other abnormal glucose: Secondary | ICD-10-CM | POA: Diagnosis not present

## 2017-11-28 DIAGNOSIS — J441 Chronic obstructive pulmonary disease with (acute) exacerbation: Secondary | ICD-10-CM

## 2017-11-28 DIAGNOSIS — Z131 Encounter for screening for diabetes mellitus: Secondary | ICD-10-CM

## 2017-11-28 DIAGNOSIS — Z130 Encounter for screening for diseases of the blood and blood-forming organs and certain disorders involving the immune mechanism: Secondary | ICD-10-CM

## 2017-11-28 DIAGNOSIS — G479 Sleep disorder, unspecified: Secondary | ICD-10-CM

## 2017-11-28 DIAGNOSIS — D72829 Elevated white blood cell count, unspecified: Secondary | ICD-10-CM

## 2017-11-28 LAB — LIPID PANEL
CHOL/HDL RATIO: 3
Cholesterol: 241 mg/dL — ABNORMAL HIGH (ref 0–200)
HDL: 75 mg/dL (ref >39.00)
LDL CALC: 154 mg/dL — AB (ref 0–99)
NONHDL: 165.8
TRIGLYCERIDES: 60 mg/dL (ref 0.0–149.0)
VLDL: 12 mg/dL (ref 0.0–40.0)

## 2017-11-28 LAB — COMPREHENSIVE METABOLIC PANEL
ALT: 18 U/L (ref 0–35)
AST: 19 U/L (ref 0–37)
Albumin: 4.1 g/dL (ref 3.5–5.2)
Alkaline Phosphatase: 75 U/L (ref 39–117)
BUN: 26 mg/dL — AB (ref 6–23)
CO2: 28 meq/L (ref 19–32)
Calcium: 9.6 mg/dL (ref 8.4–10.5)
Chloride: 108 mEq/L (ref 96–112)
Creatinine, Ser: 0.77 mg/dL (ref 0.40–1.20)
GFR: 78.95 mL/min (ref >60.00)
GLUCOSE: 96 mg/dL (ref 70–99)
Potassium: 4.4 mEq/L (ref 3.5–5.1)
SODIUM: 142 meq/L (ref 135–145)
Total Bilirubin: 0.4 mg/dL (ref 0.2–1.2)
Total Protein: 6.5 g/dL (ref 6.0–8.3)

## 2017-11-28 LAB — CBC
HCT: 43.8 % (ref 36.0–46.0)
HEMOGLOBIN: 14.5 g/dL (ref 12.0–15.0)
MCHC: 33.1 g/dL (ref 30.0–36.0)
MCV: 93.6 fl (ref 78.0–100.0)
Platelets: 328 10*3/uL (ref 150.0–400.0)
RBC: 4.68 Mil/uL (ref 3.87–5.11)
RDW: 14.7 % (ref 11.5–15.5)
WBC: 11.3 10*3/uL — ABNORMAL HIGH (ref 4.0–10.5)

## 2017-11-28 LAB — HEMOGLOBIN A1C: Hgb A1c MFr Bld: 6.4 % (ref 4.6–6.5)

## 2017-11-28 LAB — FERRITIN: Ferritin: 49.9 ng/mL (ref 10.0–291.0)

## 2017-11-28 MED ORDER — PROPRANOLOL HCL 10 MG PO TABS: 10.0000 mg | ORAL_TABLET | Freq: Three times a day (TID) | ORAL | 3 refills | Status: DC

## 2017-11-28 NOTE — Patient Instructions (Signed)
Great to see you as always!   Let's try increasing your trazodone to 75 mg (1.5 pills) at bedtime.  Let me know how this works for you. To review, the symptoms of serotonin syndrome may include  Agitation or restlessness Confusion Rapid heart rate and high blood pressure Dilated pupils Loss of muscle coordination or twitching muscles Muscle rigidity Heavy sweating Diarrhea Headache Shivering Goose bumps  We have to be cautious as you are on lexapro and trazodone, but serotonin syndrome is fortunately rare  If any of these occur please stop the meds and contact me/ otherwise seek help right away   Let's try propranolol 10 mg twice a day as needed for anxiety- can go to three times a day if needed.  If you are taking it on a regular basis do not stop suddenly, but taper off if you no longer feel that you need it.    I will be in touch with your labs asap!    Please see me in about 2 months to check in

## 2017-11-29 ENCOUNTER — Telehealth: Payer: Self-pay | Admitting: Urgent Care

## 2017-11-29 ENCOUNTER — Encounter: Payer: Self-pay | Admitting: Urgent Care

## 2017-11-29 ENCOUNTER — Ambulatory Visit (INDEPENDENT_AMBULATORY_CARE_PROVIDER_SITE_OTHER): Payer: Medicare Other

## 2017-11-29 ENCOUNTER — Ambulatory Visit (INDEPENDENT_AMBULATORY_CARE_PROVIDER_SITE_OTHER): Payer: Medicare Other | Admitting: Urgent Care

## 2017-11-29 ENCOUNTER — Other Ambulatory Visit: Payer: Self-pay | Admitting: *Deleted

## 2017-11-29 VITALS — BP 139/82 | HR 78 | Temp 98.6°F | Ht 61.0 in | Wt 158.8 lb

## 2017-11-29 DIAGNOSIS — Z86718 Personal history of other venous thrombosis and embolism: Secondary | ICD-10-CM | POA: Diagnosis not present

## 2017-11-29 DIAGNOSIS — J441 Chronic obstructive pulmonary disease with (acute) exacerbation: Secondary | ICD-10-CM

## 2017-11-29 DIAGNOSIS — J449 Chronic obstructive pulmonary disease, unspecified: Secondary | ICD-10-CM

## 2017-11-29 DIAGNOSIS — R05 Cough: Secondary | ICD-10-CM | POA: Diagnosis not present

## 2017-11-29 DIAGNOSIS — R0602 Shortness of breath: Secondary | ICD-10-CM

## 2017-11-29 DIAGNOSIS — F17200 Nicotine dependence, unspecified, uncomplicated: Secondary | ICD-10-CM | POA: Diagnosis not present

## 2017-11-29 LAB — D-DIMER, QUANTITATIVE: D-DIMER: 0.38 mg/L FEU (ref 0.00–0.49)

## 2017-11-29 MED ORDER — LEVALBUTEROL HCL 1.25 MG/0.5ML IN NEBU: 1.2500 mg | INHALATION_SOLUTION | Freq: Once | RESPIRATORY_TRACT | Status: DC

## 2017-11-29 MED ORDER — LEVALBUTEROL HCL 0.63 MG/3ML IN NEBU: 0.6300 mg | INHALATION_SOLUTION | Freq: Once | RESPIRATORY_TRACT | Status: DC

## 2017-11-29 MED ORDER — LEVALBUTEROL HCL 1.25 MG/0.5ML IN NEBU
1.2500 mg | INHALATION_SOLUTION | Freq: Once | RESPIRATORY_TRACT | Status: AC
Start: 2017-11-29 — End: 2017-11-29
  Administered 2017-11-29: 1.25 mg via RESPIRATORY_TRACT

## 2017-11-29 MED ORDER — IPRATROPIUM BROMIDE 0.02 % IN SOLN
0.5000 mg | Freq: Once | RESPIRATORY_TRACT | Status: AC
  Administered 2017-11-29: 0.5 mg via RESPIRATORY_TRACT

## 2017-11-29 NOTE — Telephone Encounter (Signed)
Jasmine from [facility] called with results of a D-Dimer of 0.38. Will notify flow at [facility]. Raynelle FanningJulie at [facility] notified.

## 2017-11-29 NOTE — Progress Notes (Signed)
MRN: [id number] DOB: 1950-12-22  Subjective:   Tasha [name] is a 67 y.o. female presenting for acute onset of shob this morning. Started using albuterol this morning, used it twice today. Denies fever, chest pain, hemoptysis, confusion, dizziness. She does have COPD, managed by Dr. Isaiah Serge. She did see her PCP yesterday. Had her Lexapro refilled, was also started on propranolol for better management of her anxiety. She did start that medication yesterday and took another dose today.  She does admit that she thinks her anxiety is affecting her as well.  She does continue to smoke cigarettes.  Tasha [name] has a current medication list which includes the following prescription(s): albuterol, albuterol, aspirin, betamethasone dipropionate, cholecalciferol, escitalopram, ipratropium, propranolol, tiotropium bromide monohydrate, and trazodone, and the following Facility-Administered Medications: levalbuterol. Also is allergic to acitretin; percodan [oxycodone-aspirin]; and statins.  Tasha [name]  has a past medical history of Abnormal nuclear stress test (06/10/2015), Allergy, Anxiety, Arthritis, Asthma, Clotting disorder (HCC), Depression, DVT (deep venous thrombosis) (HCC), GERD (gastroesophageal reflux disease), Hyperlipidemia, Hypertension, and Psoriasiform dermatitis. Also  has a past surgical history that includes Nose surgery; Tonsillectomy; and Tubal ligation.  Objective:   Vitals: BP 139/82 (BP Location: Right Arm, Patient Position: Sitting, Cuff Size: Normal)   Pulse 78   Temp 98.6 F (37 C) (Oral)   Ht 5\' 1"  (1.549 m)   Wt 158 lb 12.8 oz (72 kg)   SpO2 92%   BMI 30.00 kg/m   Physical Exam  Constitutional: She is oriented to person, place, and time. She appears well-developed and well-nourished. No distress.  HENT:  Mouth/Throat: Oropharynx is clear and moist.  Eyes: Pupils are equal, round, and reactive to light. EOM are normal. Right eye exhibits no discharge. Left eye exhibits no discharge. No  scleral icterus.  Neck: Normal range of motion. Neck supple.  Cardiovascular: Normal rate, regular rhythm and intact distal pulses. Exam reveals no gallop and no friction rub.  No murmur heard. Pulmonary/Chest: No stridor. No respiratory distress. She has no wheezes. She has no rales.  Musculoskeletal: She exhibits no edema.  Lymphadenopathy:    She has no cervical adenopathy.  Neurological: She is alert and oriented to person, place, and time.  Skin: Skin is warm and dry. She is not diaphoretic.  Psychiatric: She has a normal mood and affect.   Results for orders placed or performed in visit on 11/28/17 (from the past 24 hour(s))  CBC     Status: Abnormal   Collection Time: 11/28/17 10:31 AM  Result Value Ref Range   WBC 11.3 (H) 4.0 - 10.5 K/uL   RBC 4.68 3.87 - 5.11 Mil/uL   Platelets 328.0 150.0 - 400.0 K/uL   Hemoglobin 14.5 12.0 - 15.0 g/dL   HCT 40.9 81.1 - 91.4 %   MCV 93.6 78.0 - 100.0 fl   MCHC 33.1 30.0 - 36.0 g/dL   RDW 78.2 95.6 - 21.3 %  Comprehensive metabolic panel     Status: Abnormal   Collection Time: 11/28/17 10:31 AM  Result Value Ref Range   Sodium 142 135 - 145 mEq/L   Potassium 4.4 3.5 - 5.1 mEq/L   Chloride 108 96 - 112 mEq/L   CO2 28 19 - 32 mEq/L   Glucose, Bld 96 70 - 99 mg/dL   BUN 26 (H) 6 - 23 mg/dL   Creatinine, Ser 0.86 0.40 - 1.20 mg/dL   Total Bilirubin 0.4 0.2 - 1.2 mg/dL   Alkaline Phosphatase 75 39 - 117 U/L  AST 19 0 - 37 U/L   ALT 18 0 - 35 U/L   Total Protein 6.5 6.0 - 8.3 g/dL   Albumin 4.1 3.5 - 5.2 g/dL   Calcium 9.6 8.4 - 25.310.5 mg/dL   GFR 66.4478.95 >03.47>60.00 mL/min  Hemoglobin A1c     Status: None   Collection Time: 11/28/17 10:31 AM  Result Value Ref Range   Hgb A1c MFr Bld 6.4 4.6 - 6.5 %  Lipid panel     Status: Abnormal   Collection Time: 11/28/17 10:31 AM  Result Value Ref Range   Cholesterol 241 (H) 0 - 200 mg/dL   Triglycerides 42.560.0 0.0 - 149.0 mg/dL   HDL 95.6375.00 >87.56>39.00 mg/dL   VLDL 43.312.0 0.0 - 29.540.0 mg/dL   LDL  Cholesterol 188154 (H) 0 - 99 mg/dL   Total CHOL/HDL Ratio 3    NonHDL 165.80   Ferritin     Status: None   Collection Time: 11/28/17 10:31 AM  Result Value Ref Range   Ferritin 49.9 10.0 - 291.0 ng/mL    Dg Chest 2 View  Result Date: 11/29/2017 CLINICAL DATA:  Cough and shortness of breath EXAM: CHEST - 2 VIEW COMPARISON:  CT scan of the chest of September 12, 2017 and chest x-ray of May 11, 2016. FINDINGS: The lungs are adequately inflated. The lung markings are coarse at both bases but this is stable. The heart and pulmonary vascularity are normal. The mediastinum is normal in width. There is calcification in the wall of the aortic arch. There is no pleural effusion. There is multilevel degenerative disc disease of the thoracic spine. IMPRESSION: Chronic bronchitic changes with stable bibasilar scarring. No pneumonia nor CHF. Thoracic aortic atherosclerosis. Electronically Signed   By: David  SwazilandJordan M.D.   On: 11/29/2017 10:09    Assessment and Plan :   Shortness of breath - Plan: ipratropium (ATROVENT) nebulizer solution 0.5 mg, DG Chest 2 View, levalbuterol (XOPENEX) nebulizer solution 1.25 mg, D-dimer, quantitative (not at [facility]), DISCONTINUED: levalbuterol (XOPENEX) nebulizer solution 1.25 mg, DISCONTINUED: levalbuterol (XOPENEX) nebulizer solution 0.63 mg  Chronic obstructive pulmonary disease with acute exacerbation (HCC)  COPD, group C, by GOLD 2017 classification (HCC)  History of DVT (deep vein thrombosis) - Plan: D-dimer, quantitative ([facility])  Tobacco use disorder  Patient has had previous hypoxemia, at her last office visit with me 10/11/2017, patient had a pulse oximetry less than 90%.  During that visit she was adamant about not going to the ER despite the possibility of COPD exacerbation, respiratory failure, chest clot.  Today she shares the same sentiment.  She did receive a nebulizer treatment with Xopenex and Atrovent.  She is very well-appearing in clinic, chest x-ray  and physical exam findings are reassuring.  D-dimer result is pending.  ER precautions reviewed again encouraged patient to set up follow-up with her PCP and pulmonologist.  Wallis BambergMario Hitomi Slape, PA-C [facility] [phone] 11/29/2017  9:43 AM

## 2017-11-29 NOTE — Patient Instructions (Addendum)
Please report to the ER immediately if you develop chest pain, worsening shortness of breath, spit out blood at this may be a sign of a heart attack, chest clot or respiratory failure.  I encourage you to set up follow-up with your PCP, Dr. Patsy Lageropland and your pulmonologist.  That we can continue to get help managing your anxiety and your COPD both of which may be components of your shortness of breath.   Shortness of Breath, Adult Shortness of breath is when a person has trouble breathing enough air, or when a person feels like she or he is having trouble breathing in enough air. Shortness of breath could be a sign of medical problem. Follow these instructions at home: Pay attention to any changes in your symptoms. Take these actions to help with your condition:  Do not smoke. Smoking is a common cause of shortness of breath. If you smoke and you need help quitting, ask your health care provider.  Avoid things that can irritate your airways, such as: ? Mold. ? Dust. ? Air pollution. ? Chemical fumes. ? Things that can cause allergy symptoms (allergens), if you have allergies.  Keep your living space clean and free of mold and dust.  Rest as needed. Slowly return to your usual activities.  Take over-the-counter and prescription medicines, including oxygen and inhaled medicines, only as told by your health care provider.  Keep all follow-up visits as told by your health care provider. This is important.  Contact a health care provider if:  Your condition does not improve as soon as expected.  You have a hard time doing your normal activities, even after you rest.  You have new symptoms. Get help right away if:  Your shortness of breath gets worse.  You have shortness of breath when you are resting.  You feel light-headed or you faint.  You have a cough that is not controlled with medicines.  You cough up blood.  You have pain with breathing.  You have pain in your chest,  arms, shoulders, or abdomen.  You have a fever.  You cannot walk up stairs or exercise the way that you normally do. This information is not intended to replace advice given to you by your health care provider. Make sure you discuss any questions you have with your health care provider. Document Released: 02/09/2001 Document Revised: 12/06/2015 Document Reviewed: 10/23/2015 Elsevier Interactive Patient Education  2018 [employer]     IF you received an x-ray today, you will receive an invoice from [facility]. Please contact [facility] at [phone] with questions or concerns regarding your invoice.   IF you received labwork today, you will receive an invoice from [facility]. Please contact [facility] at [phone] with questions or concerns regarding your invoice.   Our billing staff will not be able to assist you with questions regarding bills from these companies.  You will be contacted with the lab results as soon as they are available. The fastest way to get your results is to activate your My Chart account. Instructions are located on the last page of this paperwork. If you have not heard from us regarding the results in 2 weeks, please contact this office.

## 2017-12-06 DIAGNOSIS — J45998 Other asthma: Secondary | ICD-10-CM | POA: Diagnosis not present

## 2017-12-06 DIAGNOSIS — R030 Elevated blood-pressure reading, without diagnosis of hypertension: Secondary | ICD-10-CM | POA: Diagnosis not present

## 2017-12-06 NOTE — Progress Notes (Signed)
[facility] [address], [address] [place], [place] [address] [phone]  Date:  12/07/2017   Name:  Tasha [name]   DOB:  August 11, 1950   MRN:  [id number]  PCP:  Pearline Cables, MD    Chief Complaint: Shortness of Breath (urgent care follow up, 12/06/17 shortness of breath, given prednisone and breathing treatment-lots of improvement)   History of Present Illness:  Tasha [name] is a 68 y.o. very pleasant female patient who presents with the following:  Following up from an urgent care visit today History of COPD, HTN, pre-diabetes, hyperlipidemia, depression  She had seen me on 7 /1, then went to [place] the following day and saw Gurney Maxin: Tasha [name] is a 67 y.o. female presenting for acute onset of shob this morning. Started using albuterol this morning, used it twice today. Denies fever, chest pain, hemoptysis, confusion, dizziness. She does have COPD, managed by Dr. Isaiah Serge. She did see her PCP yesterday. Had her Lexapro refilled, was also started on propranolol for better management of her anxiety. She did start that medication yesterday and took another dose today.  She does admit that she thinks her anxiety is affecting her as well.  She does continue to smoke cigarettes////////////////////////////////////////////////// Patient has had previous hypoxemia, at her last office visit with me 10/11/2017, patient had a pulse oximetry less than 90%.  During that visit she was adamant about not going to the ER despite the possibility of COPD exacerbation, respiratory failure, chest clot.  Today she shares the same sentiment.  She did receive a nebulizer treatment with Xopenex and Atrovent.  She is very well-appearing in clinic, chest x-ray and physical exam findings are reassuring.  D-dimer result is pending.  ER precautions reviewed again encouraged patient to set up follow-up with her PCP and pulmonologist.  She then also went to  [facility] yesterday and was given albuterol and prednisone  She went to UC because "I could not breathe."    She does have a neb machine at home and has been using it - plain albuterol nebs  She was given 60 mg of pred and then was given an rx for 40 mg to take for 5 days- she will pick this up later on today No fever Today she is feeling better  Her breathing is "not bad today," she did a neb treatment prior to coming in today  She is on spiriva- however admits that she generally only takes it once a day due to cost   She has noted worsening of her "hypnic jerks" that will make it hard for her to sleep.   Discussed possible treatments for same- she does not wish to take anything that might put her at risk for relapse of her alcoholism. She has been dry for over 30 years now   We did try propranolol for her anxiety, but she is not sure if it's helping her.  She may wish to wean off this and we discussed how to do so  Discussed increasing her lexpro, but she is concerned about serotonin syndrome esp in conjunction with her trazodone- would prefer to stick with 10 mg   BP Readings from Last 3 Encounters:  12/07/17 112/78  11/29/17 139/82  11/28/17 136/80     Patient Active Problem List   Diagnosis Date Noted  . COPD, group C, by GOLD 2017 classification (HCC) 08/24/2016  . Nut allergy 09/03/2015  . Abnormal  nuclear stress test 06/10/2015  . Family history of colorectal cancer 05/27/2015  . Tobacco abuse 05/27/2015  . 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) 04/28/2015  . Essential hypertension 04/24/2014  . DVT (deep venous thrombosis) (HCC) 03/10/2012  . 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 Pre-diabetes 02/10/2009  . ENDOMETRIAL POLYP 10/01/2008  . HYPERLIPIDEMIA 04/17/2008  . DEPRESSION 04/17/2008  . ASTHMA 04/17/2008  . SCOLIOSIS 04/17/2008  . COLONIC POLYPS, HX OF 04/17/2008    Past Medical History:  Diagnosis Date  . Abnormal nuclear stress test 06/10/2015  . Allergy   . Anxiety    . Arthritis    DDD lumbar; B thumbs  . Asthma    age 49  . Clotting disorder (HCC)   . Depression   . DVT (deep venous thrombosis) (HCC)   . GERD (gastroesophageal reflux disease)   . Hyperlipidemia   . Hypertension   . Psoriasiform dermatitis     Past Surgical History:  Procedure Laterality Date  . NOSE SURGERY    . TONSILLECTOMY    . TUBAL LIGATION      Social History   Tobacco Use  . Smoking status: Current Every Day Smoker    Packs/day: 0.25    Years: 46.00    Pack years: 11.50    Types: Cigarettes  . Smokeless tobacco: Never Used  Substance Use Topics  . Alcohol use: No  . Drug use: No    Family History  Problem Relation Age of Onset  . Cancer Mother 4       colorectal cancer  . Stroke Mother 52       CVA  . Alcohol abuse Mother   . Diabetes Mother   . Hypertension Mother   . Heart disease Father   . Cancer Maternal Grandfather        lung  . Alcohol abuse Brother   . Heart attack Brother   . Cancer Maternal Aunt        breast  . Breast cancer Maternal Aunt 75    Allergies  Allergen Reactions  . Acitretin Swelling and Rash  . Percodan [Oxycodone-Aspirin]   . Statins Other (See Comments)    Muscle aches in legs     Medication list has been reviewed and updated.  Current Outpatient Medications on File Prior to Visit  Medication Sig Dispense Refill  . albuterol (PROVENTIL) (2.5 MG/3ML) 0.083% nebulizer solution Take 3 mLs (2.5 mg total) by nebulization every 4 (four) hours as needed for wheezing or shortness of breath. 150 mL 1  . albuterol (VENTOLIN HFA) 108 (90 Base) MCG/ACT inhaler inhale 2 puffs by mouth every 4 hours as needed for wheezing or shortness of breath 18 g 5  . aspirin 325 MG tablet Take 325 mg by mouth daily.    . betamethasone dipropionate (DIPROLENE) 0.05 % cream   0  . cholecalciferol (VITAMIN D) 1000 UNITS tablet Take 2,000 Units by mouth daily.     [name] escitalopram (LEXAPRO) 10 MG tablet TAKE ONE TABLET BY MOUTH ONE TIME  DAILY  30 tablet 0  . predniSONE (DELTASONE) 20 MG tablet Take 20 mg by mouth 2 (two) times daily.  0  . propranolol (INDERAL) 10 MG tablet Take 1 tablet (10 mg total) by mouth 3 (three) times daily. Use as needed for anxiety or panic symptoms 60 tablet 3  . Tiotropium Bromide Monohydrate (SPIRIVA RESPIMAT) 2.5 MCG/ACT AERS Inhale 2 puffs into the lungs daily. 1 Inhaler 6  . traZODone (DESYREL) 50 MG tablet take 1/2 to  1 tablet by mouth at bedtime as needed for sleep 90 tablet 2   No current facility-administered medications on file prior to visit.     Review of Systems:  As per HPI- otherwise negative. No glaucoma history  No fever or chills Feeling much better today   Physical Examination: Vitals:   12/07/17 1133  BP: 112/78  Pulse: 66  Resp: 18  SpO2: 95%   Vitals:   12/07/17 1133  Weight: 147 lb (66.7 kg)  Height: 5\' 1"  (1.549 m)   Body mass index is 27.78 kg/m. Ideal Body Weight: Weight in (lb) to have BMI = 25: 132  GEN: WDWN, NAD, Non-toxic, A & O x 3, looks well  HEENT: Atraumatic, Normocephalic. Neck supple. No masses, No LAD. Ears and Nose: No external deformity. CV: RRR, No M/G/R. No JVD. No thrill. No extra heart sounds. PULM: CTA B, no wheezes, crackles, rhonchi. No retractions. No resp. distress. No accessory muscle use. Lungs are ok at time of exam - she is not wheezing  EXTR: No c/c/e NEURO Normal gait.  PSYCH: Normally interactive. Conversant. Not depressed or anxious appearing.  Calm demeanor.    Assessment and Plan: Chronic obstructive pulmonary disease with acute exacerbation (HCC) - Plan: ipratropium-albuterol (DUONEB) 0.5-2.5 (3) MG/3ML SOLN  Myoclonic jerking  Generalized anxiety disorder - Plan: escitalopram (LEXAPRO) 10 MG tablet  Essential hypertension  COPD exacerbation.  She is already on appropriate inhaler and prednisone per urgent care.  Also gave her duonebs to use a as needed, may be more helpful than plain albuterol neb during  acute exacerbation She will try OTC melatonin for her myoclonic jerks, will let me know how this works for her Refilled her lexapro at current dose BP is under fine control , and does look better on her BB  However if she wishes to come off this we can use a different BP med for her   Signed Abbe AmsterdamJessica Jaymien Landin, MD

## 2017-12-07 ENCOUNTER — Ambulatory Visit (INDEPENDENT_AMBULATORY_CARE_PROVIDER_SITE_OTHER): Payer: Medicare Other | Admitting: Family Medicine

## 2017-12-07 ENCOUNTER — Encounter: Payer: Self-pay | Admitting: Family Medicine

## 2017-12-07 VITALS — BP 112/78 | HR 66 | Resp 18 | Ht 61.0 in | Wt 147.0 lb

## 2017-12-07 DIAGNOSIS — F411 Generalized anxiety disorder: Secondary | ICD-10-CM | POA: Diagnosis not present

## 2017-12-07 DIAGNOSIS — G253 Myoclonus: Secondary | ICD-10-CM | POA: Diagnosis not present

## 2017-12-07 DIAGNOSIS — I10 Essential (primary) hypertension: Secondary | ICD-10-CM

## 2017-12-07 DIAGNOSIS — J441 Chronic obstructive pulmonary disease with (acute) exacerbation: Secondary | ICD-10-CM

## 2017-12-07 MED ORDER — IPRATROPIUM-ALBUTEROL 0.5-2.5 (3) MG/3ML IN SOLN: 3.0000 mL | Freq: Four times a day (QID) | RESPIRATORY_TRACT | 3 refills | Status: DC | PRN

## 2017-12-07 MED ORDER — ESCITALOPRAM OXALATE 10 MG PO TABS: 10.0000 mg | ORAL_TABLET | Freq: Every day | ORAL | 3 refills | Status: DC

## 2017-12-07 NOTE — Patient Instructions (Addendum)
Try OTC melatonin for your leg jerks at night Let's replace your plain albuterol nebs with duonebs for now- this is both albuterol and atrovent and may be more helpful for you over the next few days   Please let me know if you are not continuing to improve as far as your breathing!   We refilled your lexapro for you today If you feel like the propranolol is not helping you can stop taking it- however please taper off this gradually   Please come and see me in about 4 months

## 2017-12-08 DIAGNOSIS — J45998 Other asthma: Secondary | ICD-10-CM | POA: Diagnosis not present

## 2017-12-08 DIAGNOSIS — R0602 Shortness of breath: Secondary | ICD-10-CM | POA: Diagnosis not present

## 2017-12-09 ENCOUNTER — Other Ambulatory Visit: Payer: Self-pay

## 2017-12-09 ENCOUNTER — Ambulatory Visit (INDEPENDENT_AMBULATORY_CARE_PROVIDER_SITE_OTHER): Payer: Medicare Other | Admitting: Family Medicine

## 2017-12-09 ENCOUNTER — Encounter: Payer: Self-pay | Admitting: Family Medicine

## 2017-12-09 VITALS — BP 132/78 | HR 85 | Temp 98.2°F | Resp 24 | Ht 61.0 in | Wt 147.0 lb

## 2017-12-09 DIAGNOSIS — F411 Generalized anxiety disorder: Secondary | ICD-10-CM | POA: Diagnosis not present

## 2017-12-09 MED ORDER — BUSPIRONE HCL 7.5 MG PO TABS: 7.5000 mg | ORAL_TABLET | Freq: Two times a day (BID) | ORAL | 0 refills | Status: DC

## 2017-12-09 NOTE — Patient Instructions (Signed)
Coping skills Choose 5 that work for you:  Take a deep breath  Count to 20  Read a book  Do a puzzle  Meditate  Bake  Sing  Knit  Garden  Pray  Go outside  Call a friend  Listen to music  Take a walk  Color  Send a note  Take a bath  Watch a movie  Be alone in a quiet place  Pet an animal  Visit a friend  Journal  Exercise  Stretch   Let us know if you need anything.

## 2017-12-09 NOTE — Progress Notes (Signed)
Chief Complaint  Patient presents with  . Anxiety    requesting different medication    Subjective Tasha [name] presents for f/u anxiety/depression.  She is currently being treated with Lexapro 10 mg/d.  Was started on propranolol and did not help. Took for around 1.5 weeks. She has gone to urgent care several times due to asthma flares that she believes were triggered by her anxiety. No thoughts of harming self or others.  No self-medication with alcohol, prescription drugs or illicit drugs. Would prefer to avoid benzos given hx of alcohol abuse. Has not failed any other medications. Stressors include finding out others believe that her brother has alcohol dementia. Financial issues with dog has also been contributing.  Pt is following with a counselor/psychologist.  ROS Psych: No homicidal or suicidal thoughts  Past Medical History:  Diagnosis Date  . Abnormal nuclear stress test 06/10/2015  . Allergy   . Anxiety   . Arthritis    DDD lumbar; B thumbs  . Asthma    age 67  . Clotting disorder (HCC)   . Depression   . DVT (deep venous thrombosis) (HCC)   . GERD (gastroesophageal reflux disease)   . Hyperlipidemia   . Hypertension   . Psoriasiform dermatitis    Exam BP 132/78 (BP Location: Right Arm, Patient Position: Sitting, Cuff Size: Normal)   Pulse 85   Temp 98.2 F (36.8 C)   Resp (!) 24   Ht 5\' 1"  (1.549 m)   Wt 147 lb (66.7 kg)   SpO2 92%   BMI 27.78 kg/m  General:  well developed, well nourished, in no apparent distress Neck: neck supple without adenopathy, thyromegaly, or masses Lungs: Decreased breath sounds; otherwise clear to auscultation, breath sounds equal bilaterally, no respiratory distress; RR was 16 during my exam Cardio:  regular rate and rhythm without murmurs, heart sounds without clicks or rubs Psych: anxious appearing, age-appropriate judgement/insight, alert and oriented x4.   Assessment and Plan  Generalized anxiety disorder - Plan:  busPIRone (BUSPAR) 7.5 MG tablet  Orders as above. Anxiety coping strategies provided and AVS.  Continue with therapist.  Continue on Lexapro and trazodone.  Add BuSpar.  Would consider a slight increase in Lexapro versus increasing dose of BuSpar versus addition of mirtazapine versus atypical antipsychotic if this is not helpful. F/u in 2 weeks with Dr. Patsy Lageropland. The patient voiced understanding and agreement to the plan.  Jilda Rocheicholas Paul HebronWendling, DO 12/09/17 8:50 AM

## 2017-12-09 NOTE — Progress Notes (Signed)
Pt. states propanolol not working, but no other side effects. Pt. short of breath, but not abnormal per pt. Pt. has albuterol at side, and stated she took her nebulizer this AM.

## 2017-12-10 ENCOUNTER — Emergency Department (HOSPITAL_COMMUNITY): Payer: Medicare Other

## 2017-12-10 ENCOUNTER — Inpatient Hospital Stay (HOSPITAL_COMMUNITY)
Admission: EM | Admit: 2017-12-10 | Discharge: 2017-12-12 | DRG: 190 | Disposition: A | Discharge status: Home / Self Care | Payer: Medicare Other | Attending: Family Medicine | Admitting: Family Medicine

## 2017-12-10 ENCOUNTER — Encounter (HOSPITAL_COMMUNITY): Payer: Self-pay | Admitting: Emergency Medicine

## 2017-12-10 DIAGNOSIS — Z91048 Other nonmedicinal substance allergy status: Secondary | ICD-10-CM | POA: Diagnosis not present

## 2017-12-10 DIAGNOSIS — J9611 Chronic respiratory failure with hypoxia: Secondary | ICD-10-CM | POA: Diagnosis present

## 2017-12-10 DIAGNOSIS — Z86718 Personal history of other venous thrombosis and embolism: Secondary | ICD-10-CM

## 2017-12-10 DIAGNOSIS — J9622 Acute and chronic respiratory failure with hypercapnia: Secondary | ICD-10-CM | POA: Diagnosis present

## 2017-12-10 DIAGNOSIS — Z720 Tobacco use: Secondary | ICD-10-CM | POA: Diagnosis not present

## 2017-12-10 DIAGNOSIS — F329 Major depressive disorder, single episode, unspecified: Secondary | ICD-10-CM | POA: Diagnosis present

## 2017-12-10 DIAGNOSIS — E7800 Pure hypercholesterolemia, unspecified: Secondary | ICD-10-CM | POA: Diagnosis present

## 2017-12-10 DIAGNOSIS — Z885 Allergy status to narcotic agent status: Secondary | ICD-10-CM

## 2017-12-10 DIAGNOSIS — R0602 Shortness of breath: Secondary | ICD-10-CM | POA: Diagnosis not present

## 2017-12-10 DIAGNOSIS — R05 Cough: Secondary | ICD-10-CM | POA: Diagnosis not present

## 2017-12-10 DIAGNOSIS — J441 Chronic obstructive pulmonary disease with (acute) exacerbation: Secondary | ICD-10-CM | POA: Diagnosis not present

## 2017-12-10 DIAGNOSIS — G4700 Insomnia, unspecified: Secondary | ICD-10-CM | POA: Diagnosis present

## 2017-12-10 DIAGNOSIS — E785 Hyperlipidemia, unspecified: Secondary | ICD-10-CM | POA: Diagnosis present

## 2017-12-10 DIAGNOSIS — F17210 Nicotine dependence, cigarettes, uncomplicated: Secondary | ICD-10-CM | POA: Diagnosis present

## 2017-12-10 DIAGNOSIS — J9621 Acute and chronic respiratory failure with hypoxia: Secondary | ICD-10-CM | POA: Diagnosis not present

## 2017-12-10 DIAGNOSIS — R0902 Hypoxemia: Secondary | ICD-10-CM | POA: Diagnosis not present

## 2017-12-10 DIAGNOSIS — I10 Essential (primary) hypertension: Secondary | ICD-10-CM | POA: Diagnosis present

## 2017-12-10 DIAGNOSIS — K219 Gastro-esophageal reflux disease without esophagitis: Secondary | ICD-10-CM | POA: Diagnosis present

## 2017-12-10 DIAGNOSIS — D689 Coagulation defect, unspecified: Secondary | ICD-10-CM | POA: Diagnosis present

## 2017-12-10 DIAGNOSIS — J9601 Acute respiratory failure with hypoxia: Secondary | ICD-10-CM | POA: Diagnosis not present

## 2017-12-10 DIAGNOSIS — Z79899 Other long term (current) drug therapy: Secondary | ICD-10-CM

## 2017-12-10 DIAGNOSIS — F411 Generalized anxiety disorder: Secondary | ICD-10-CM | POA: Diagnosis not present

## 2017-12-10 DIAGNOSIS — Z8249 Family history of ischemic heart disease and other diseases of the circulatory system: Secondary | ICD-10-CM

## 2017-12-10 DIAGNOSIS — Z7952 Long term (current) use of systemic steroids: Secondary | ICD-10-CM | POA: Diagnosis not present

## 2017-12-10 DIAGNOSIS — Z888 Allergy status to other drugs, medicaments and biological substances status: Secondary | ICD-10-CM | POA: Diagnosis not present

## 2017-12-10 DIAGNOSIS — Z7982 Long term (current) use of aspirin: Secondary | ICD-10-CM | POA: Diagnosis not present

## 2017-12-10 LAB — BLOOD GAS, ARTERIAL
Acid-base deficit: 0.8 mmol/L (ref 0.0–2.0)
BICARBONATE: 21.6 mmol/L (ref 20.0–28.0)
Drawn by: 11249
O2 CONTENT: 4 L/min
O2 SAT: 94.1 %
PATIENT TEMPERATURE: 98.1
PCO2 ART: 30.6 mmHg — AB (ref 32.0–48.0)
PO2 ART: 72 mmHg — AB (ref 83.0–108.0)
pH, Arterial: 7.462 — ABNORMAL HIGH (ref 7.350–7.450)

## 2017-12-10 LAB — I-STAT TROPONIN, ED: Troponin i, poc: 0 ng/mL (ref 0.00–0.08)

## 2017-12-10 LAB — CBC WITH DIFFERENTIAL/PLATELET
BASOS ABS: 0 10*3/uL (ref 0.0–0.1)
BASOS PCT: 0 %
EOS PCT: 0 %
Eosinophils Absolute: 0 10*3/uL (ref 0.0–0.7)
HCT: 45 % (ref 36.0–46.0)
Hemoglobin: 15.5 g/dL — ABNORMAL HIGH (ref 12.0–15.0)
LYMPHS PCT: 13 %
Lymphs Abs: 1.3 10*3/uL (ref 0.7–4.0)
MCH: 31.5 pg (ref 26.0–34.0)
MCHC: 34.4 g/dL (ref 30.0–36.0)
MCV: 91.5 fL (ref 78.0–100.0)
MONO ABS: 0.7 10*3/uL (ref 0.1–1.0)
Monocytes Relative: 7 %
NEUTROS ABS: 7.6 10*3/uL (ref 1.7–7.7)
Neutrophils Relative %: 80 %
PLATELETS: 322 10*3/uL (ref 150–400)
RBC: 4.92 MIL/uL (ref 3.87–5.11)
RDW: 14.1 % (ref 11.5–15.5)
WBC: 9.5 10*3/uL (ref 4.0–10.5)

## 2017-12-10 LAB — COMPREHENSIVE METABOLIC PANEL
ALT: 24 U/L (ref 0–44)
AST: 23 U/L (ref 15–41)
Albumin: 3.9 g/dL (ref 3.5–5.0)
Alkaline Phosphatase: 65 U/L (ref 38–126)
Anion gap: 10 (ref 5–15)
BILIRUBIN TOTAL: 0.4 mg/dL (ref 0.3–1.2)
BUN: 19 mg/dL (ref 8–23)
CHLORIDE: 109 mmol/L (ref 98–111)
CO2: 24 mmol/L (ref 22–32)
CREATININE: 0.77 mg/dL (ref 0.44–1.00)
Calcium: 9.9 mg/dL (ref 8.9–10.3)
Glucose, Bld: 143 mg/dL — ABNORMAL HIGH (ref 70–99)
Potassium: 4.1 mmol/L (ref 3.5–5.1)
Sodium: 143 mmol/L (ref 135–145)
Total Protein: 7.1 g/dL (ref 6.5–8.1)

## 2017-12-10 LAB — D-DIMER, QUANTITATIVE: D-Dimer, Quant: 0.27 ug/mL-FEU (ref 0.00–0.50)

## 2017-12-10 MED ORDER — IOPAMIDOL (ISOVUE-370) INJECTION 76%
100.0000 mL | Freq: Once | INTRAVENOUS | Status: AC | PRN
  Administered 2017-12-10: 75 mL via INTRAVENOUS

## 2017-12-10 MED ORDER — METHYLPREDNISOLONE SODIUM SUCC 125 MG IJ SOLR
125.0000 mg | Freq: Once | INTRAMUSCULAR | Status: AC
  Administered 2017-12-10: 125 mg via INTRAVENOUS
  Filled 2017-12-10: qty 2

## 2017-12-10 MED ORDER — IPRATROPIUM-ALBUTEROL 0.5-2.5 (3) MG/3ML IN SOLN
3.0000 mL | Freq: Four times a day (QID) | RESPIRATORY_TRACT | Status: DC
  Administered 2017-12-11 (×4): 3 mL via RESPIRATORY_TRACT
  Filled 2017-12-10 (×4): qty 3

## 2017-12-10 MED ORDER — IPRATROPIUM BROMIDE 0.02 % IN SOLN
0.5000 mg | Freq: Once | RESPIRATORY_TRACT | Status: AC
  Administered 2017-12-10: 0.5 mg via RESPIRATORY_TRACT
  Filled 2017-12-10: qty 2.5

## 2017-12-10 MED ORDER — ALBUTEROL (5 MG/ML) CONTINUOUS INHALATION SOLN
10.0000 mg/h | INHALATION_SOLUTION | Freq: Once | RESPIRATORY_TRACT | Status: AC
  Administered 2017-12-10: 10 mg/h via RESPIRATORY_TRACT
  Filled 2017-12-10: qty 20

## 2017-12-10 MED ORDER — NICOTINE 14 MG/24HR TD PT24
14.0000 mg | MEDICATED_PATCH | Freq: Every day | TRANSDERMAL | Status: DC
  Administered 2017-12-12: 14 mg via TRANSDERMAL
  Filled 2017-12-10 (×2): qty 1

## 2017-12-10 MED ORDER — ALBUTEROL SULFATE (2.5 MG/3ML) 0.083% IN NEBU
5.0000 mg | INHALATION_SOLUTION | Freq: Once | RESPIRATORY_TRACT | Status: AC
  Administered 2017-12-10: 5 mg via RESPIRATORY_TRACT
  Filled 2017-12-10: qty 6

## 2017-12-10 MED ORDER — IOPAMIDOL (ISOVUE-370) INJECTION 76%
INTRAVENOUS | Status: AC
  Filled 2017-12-10: qty 100

## 2017-12-10 NOTE — ED Provider Notes (Addendum)
[facility]-EMERGENCY DEPT Provider Note   CSN: [id number] Arrival date & time: 12/10/17  1616     History   Chief Complaint Chief Complaint  Patient presents with  . Shortness of Breath    HPI Tasha [name] is a 67 y.o. female with a h/o of DVT, clotting disorder, asthma, COPD, HLD, and HTN who presents to the emergency department from UC with a chief complaint of dyspnea.  The patient endorses constant, worsening dyspnea, onset 4 days ago.  Over the last 4 days, she has been seen 3 times by urgent care and once by her PCP twice.  Dyspnea is worse with exertion and alleviated by nothing.  Last visit to urgent care was earlier today where she was given an albuterol nebulizer treatment and advised to present to the ED for further evaluation.  She reports increased usage of her home albuterol inhaler over the last 4 days without improvement.  She was started on a prednisone burst 3 days ago and has been taking 40 mg daily.  She denies chest pain, fever, chills, cough, dizziness, lightheadedness, abdominal pain, palpitations, or leg swelling.   She is a current, 0.25 pack/day smoker. Follow by Dr. Isaiah Serge with Pulmonology last seen 09/18.  She has not actively anticoagulated.  The history is provided by the patient. No language interpreter was used.    Past Medical History:  Diagnosis Date  . Abnormal nuclear stress test 06/10/2015  . Allergy   . Anxiety   . Arthritis    DDD lumbar; B thumbs  . Asthma    age 38  . Clotting disorder (HCC)   . Depression   . DVT (deep venous thrombosis) (HCC)   . GERD (gastroesophageal reflux disease)   . Hyperlipidemia   . Hypertension   . Psoriasiform dermatitis     Patient Active Problem List   Diagnosis Date Noted  . COPD with acute exacerbation (HCC) 12/10/2017  . Acute on chronic respiratory failure with hypoxia (HCC) 12/10/2017  . Generalized anxiety disorder 12/09/2017  . COPD, group C, by GOLD 2017  classification (HCC) 08/24/2016  . Nut allergy 09/03/2015  . Abnormal nuclear stress test 06/10/2015  . Family history of colorectal cancer 05/27/2015  . Tobacco abuse 05/27/2015  . 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) 04/28/2015  . Essential hypertension 04/24/2014  . DVT (deep venous thrombosis) (HCC) 03/10/2012  . 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 Pre-diabetes 02/10/2009  . ENDOMETRIAL POLYP 10/01/2008  . Hypercholesteremia 04/17/2008  . DEPRESSION 04/17/2008  . ASTHMA 04/17/2008  . SCOLIOSIS 04/17/2008  . COLONIC POLYPS, HX OF 04/17/2008    Past Surgical History:  Procedure Laterality Date  . NOSE SURGERY    . TONSILLECTOMY    . TUBAL LIGATION       OB History   None      Home Medications    Prior to Admission medications   Medication Sig Start Date End Date Taking? Authorizing Provider  albuterol (VENTOLIN HFA) 108 (90 Base) MCG/ACT inhaler inhale 2 puffs by mouth every 4 hours as needed for wheezing or shortness of breath 10/11/17  Yes Wallis Bamberg, PA-C  aspirin 325 MG tablet Take 325 mg by mouth daily.   Yes [provider]  betamethasone dipropionate (DIPROLENE) 0.05 % cream  10/14/17  Yes [provider]  cholecalciferol (VITAMIN D) 1000 UNITS tablet Take 2,000 Units by mouth daily.    Yes [provider]  escitalopram (LEXAPRO) 10 MG tablet Take 1 tablet (10 mg total) by  mouth daily. 12/07/17  Yes Copland, Gwenlyn FoundJessica C, MD  ipratropium-albuterol (DUONEB) 0.5-2.5 (3) MG/3ML SOLN Take 3 mLs by nebulization every 6 (six) hours as needed. 12/07/17  Yes Copland, Gwenlyn FoundJessica C, MD  Melatonin 3 MG CAPS Take 3 mg by mouth at bedtime.   Yes [provider]  Multiple Vitamins-Minerals (ONE-A-DAY WOMENS 50+ ADVANTAGE) TABS Take 1 tablet by mouth daily.   Yes [provider]  Tiotropium Bromide Monohydrate (SPIRIVA RESPIMAT) 2.5 MCG/ACT AERS Inhale 2 puffs into the lungs daily. 08/02/17  Yes Mannam, Praveen, MD  busPIRone (BUSPAR) 15 MG tablet  Take 0.5 tablets (7.5 mg total) by mouth 2 (two) times daily. 12/15/17   Copland, Gwenlyn FoundJessica C, MD  fluticasone furoate-vilanterol (BREO ELLIPTA) 100-25 MCG/INH AEPB Inhale 1 puff into the lungs daily. Patient not taking: Reported on 12/15/2017 12/12/17   Narda BondsNettey, Ralph A, MD  mometasone-formoterol (DULERA) 100-5 MCG/ACT AERO Inhale 2 puffs into the lungs 2 (two) times daily.    [provider]    Family History Family History  Problem Relation Age of Onset  . Cancer Mother 472       colorectal cancer  . Stroke Mother [id number]       CVA  . Alcohol abuse Mother   . Diabetes Mother   . Hypertension Mother   . Heart disease Father   . Cancer Maternal Grandfather        lung  . Alcohol abuse Brother   . Heart attack Brother   . Cancer Maternal Aunt        breast  . Breast cancer Maternal Aunt 1575    Social History Social History   Tobacco Use  . Smoking status: Current Every Day Smoker    Packs/day: 0.25    Years: 46.00    Pack years: 11.50    Types: Cigarettes  . Smokeless tobacco: Never Used  Substance Use Topics  . Alcohol use: No  . Drug use: No    Allergies   Acitretin; Pollen extract; Percodan [oxycodone-aspirin]; and Statins   Review of Systems Review of Systems  Constitutional: Negative for activity change, chills and fever.  Respiratory: Positive for shortness of breath. Negative for cough and wheezing.   Cardiovascular: Negative for chest pain, palpitations and leg swelling.  Gastrointestinal: Negative for abdominal pain, diarrhea, nausea and vomiting.  Genitourinary: Negative for dysuria.  Musculoskeletal: Negative for back pain.  Skin: Negative for rash.  Allergic/Immunologic: Negative for immunocompromised state.  Neurological: Negative for seizures, weakness, numbness and headaches.  Psychiatric/Behavioral: Negative for confusion.     Physical Exam Updated Vital Signs BP 126/76 (BP Location: Left Arm)   Pulse 80   Temp 98.7 F (37.1 C) (Oral)    Resp 20   Ht 5\' 2"  (1.575 m)   Wt 64.9 kg (143 lb 1.3 oz)   SpO2 92%   BMI 26.17 kg/m   Physical Exam  HENT:  Head: Normocephalic.  Eyes: Pupils are equal, round, and reactive to light. Conjunctivae and EOM are normal. No scleral icterus.  Neck: Neck supple. No JVD present.  Cardiovascular: Normal rate, regular rhythm, normal heart sounds and intact distal pulses. Exam reveals no gallop and no friction rub.  No murmur heard. Pulmonary/Chest: Effort normal. No stridor. No respiratory distress. She has no wheezes. She has no rales.  Conversational dyspnea with 2-3 word phrases. Lungs are clear to auscultation bilaterally.  She has accessory muscle use.  No retractions or nasal flaring.  Lung sounds are equal and symmetric in all  fields bilaterally, but diminished.  Abdominal: Soft. She exhibits no distension and no mass. There is no tenderness. There is no rebound and no guarding. No hernia.  Musculoskeletal: Normal range of motion. She exhibits no edema, tenderness or deformity.  Neurological: She is alert.  Skin: Skin is warm. No rash noted. She is not diaphoretic.  Psychiatric: Her behavior is normal.  Nursing note and vitals reviewed.  ED Treatments / Results  Labs (all labs ordered are listed, but only abnormal results are displayed) Labs Reviewed  COMPREHENSIVE METABOLIC PANEL - Abnormal; Notable for the following components:      Result Value   Glucose, Bld 143 (*)    All other components within normal limits  CBC WITH DIFFERENTIAL/PLATELET - Abnormal; Notable for the following components:   Hemoglobin 15.5 (*)    All other components within normal limits  BLOOD GAS, ARTERIAL - Abnormal; Notable for the following components:   pH, Arterial 7.462 (*)    pCO2 arterial 30.6 (*)    pO2, Arterial 72.0 (*)    All other components within normal limits  COMPREHENSIVE METABOLIC PANEL - Abnormal; Notable for the following components:   Glucose, Bld 183 (*)    Total Protein 6.3 (*)     All other components within normal limits  CBC - Abnormal; Notable for the following components:   WBC 12.4 (*)    All other components within normal limits  D-DIMER, QUANTITATIVE (NOT AT [facility])  HIV ANTIBODY (ROUTINE TESTING)  MAGNESIUM  PHOSPHORUS  TSH  I-STAT TROPONIN, ED    EKG EKG Interpretation  Date/Time:  Saturday December 10 2017 16:23:57 EDT Ventricular Rate:  97 PR Interval:    QRS Duration: 90 QT Interval:  354 QTC Calculation: 450 R Axis:   -22 Text Interpretation:  Normal sinus rhythm nonspecific ST changes Confirmed by Pricilla Loveless [id number]) on 12/10/2017 4:58:09 PM   Radiology No results found.  Procedures .Critical Care Performed by: Barkley Boards, PA-C Authorized by: Barkley Boards, PA-C   Critical care provider statement:    Critical care time (minutes):  40   Critical care time was exclusive of:  Separately billable procedures and treating other patients and teaching time   Critical care was necessary to treat or prevent imminent or life-threatening deterioration of the following conditions:  Respiratory failure   Critical care was time spent personally by me on the following activities:  Ordering and performing treatments and interventions, ordering and review of laboratory studies, ordering and review of radiographic studies, re-evaluation of patient's condition, development of treatment plan with patient or surrogate, evaluation of patient's response to treatment, review of old charts, obtaining history from patient or surrogate and examination of patient   (including critical care time)  Medications Ordered in ED Medications  iopamidol (ISOVUE-370) 76 % injection (has no administration in time range)  0.9 %  sodium chloride infusion ( Intravenous New Bag/Given 12/11/17 0125)  albuterol (PROVENTIL) (2.5 MG/3ML) 0.083% nebulizer solution 5 mg (5 mg Nebulization Given 12/10/17 1713)  methylPREDNISolone sodium succinate (SOLU-MEDROL) 125 mg/2 mL  injection 125 mg (125 mg Intravenous Given 12/10/17 1731)  albuterol (PROVENTIL,VENTOLIN) solution continuous neb (10 mg/hr Nebulization Given 12/10/17 1848)  ipratropium (ATROVENT) nebulizer solution 0.5 mg (0.5 mg Nebulization Given 12/10/17 1848)  iopamidol (ISOVUE-370) 76 % injection 100 mL (75 mLs Intravenous Contrast Given 12/10/17 2108)  methylPREDNISolone sodium succinate (SOLU-MEDROL) 125 mg/2 mL injection 60 mg (60 mg Intravenous Given 12/11/17 1733)  traZODone (DESYREL) tablet 50 mg (50 mg  Oral Given 12/11/17 2253)     Initial Impression / Assessment and Plan / ED Course  I have reviewed the triage vital signs and the nursing notes.  Pertinent labs & imaging results that were available during my care of the patient were reviewed by me and considered in my medical decision making (see chart for details).  67 year old female with a h/o of DVT, clotting disorder, asthma, COPD, HLD, and HTN presenting with worsening dyspnea over the last 4 days.  Patient was seen and evaluated along with Dr. Gwenlyn Fudge, attending physician.  Clinical Course as of Dec 19 1513  Sat Dec 10, 2017  1705 SaO2 noted to be 87% with good waveform upon arrival into the patient's room. Placed on 3.5L Cut Off with brief improvement to 92%, but returned to 88-90%. Placed on non-rebreather and albuterol nebulizer treatment ordered. Improved to 93-94%. The patient was discussed and evaluated with Dr. Criss Alvine attending physician. Solumedrol and labs ordered.    [MM]  2019 Patient re-evaluation. SaO2 of 84% on RA after continuous nebulizer treatment. Discussed with Dr. Criss Alvine. Although D-dimer was negative he recommends PE study. Will consult the hospitalist team for admission.    [MM]    Clinical Course User Index [MM] Elonda Giuliano A, PA-C   Troponin is negative.  D-dimer is negative.  Labs are otherwise unremarkable.  CT PE study with emphysema, bronchial wall thickening, lower lobe bronchial mucus plugging consistent with  bronchial inflammation.  No pulmonary edema, PE, or pneumonia.  Given continued hypoxia, consulted the hospitalist team and Dr. Adela Glimpse will admit. The patient appears reasonably stabilized for admission considering the current resources, flow, and capabilities available in the ED at this time, and I doubt any other [facility] requiring further screening and/or treatment in the ED prior to admission.  Final Clinical Impressions(s) / ED Diagnoses   Final diagnoses:  COPD exacerbation (HCC)  Acute on chronic respiratory failure with hypoxia [facility])    ED Discharge Orders        Ordered    predniSONE (DELTASONE) 20 MG tablet  2 times daily,   Status:  Discontinued     12/12/17 1207    fluticasone furoate-vilanterol (BREO ELLIPTA) 100-25 MCG/INH AEPB  Daily     12/12/17 1207    Increase activity slowly     12/12/17 1207    Diet - low sodium heart healthy     12/12/17 1207    Call MD for:  difficulty breathing, headache or visual disturbances     12/12/17 1207    Call MD for:  extreme fatigue     12/12/17 1207       Herberth Deharo A, PA-C 12/10/17 2355    Pricilla Loveless, MD 12/11/17 1457    Kalis Friese A, PA-C 12/19/17 1516    Pricilla Loveless, MD 12/20/17 1546

## 2017-12-10 NOTE — H&P (Signed)
Tasha CroonJanet P [name] [id number] DOB: May 19, 1951 DOA: 12/10/2017     PCP: Pearline Cablesopland, Tasha C, MD   Outpatient Specialists:       Pulmonary   Dr.Mannam   Patient arrived to ER on 12/10/17 at 1616  Patient coming from home Lives alone,        Chief Complaint:  Chief Complaint  Patient presents with  . Shortness of Breath    HPI: Tasha CanaryJanet P [name] is a 67 y.o. female with medical history significant of COPD, asthma, depression, tobacco abuse, HTN, HLD anxiety disorder history of DVT    Presented with   Dyspnea for past 2 weeks She was first seen by PCP on 2 July complaining shortness of breath she was started on propranolol for management of her anxiety. On 2 July chest x-ray was done showing chronic bronchitic changes with stable bibasilar scarring no pneumonia or CHF she was given Atrovent nebulizer Xopenex and diagnosed with COPD exacerbation. She has had intermittent episodes of hypoxia picked up at the office but refused going to ER at the time. To have intermittent episodes of severe shortness of breath presented to [facility] on 9 July and was started on 60 of prednisone and given 5 days Dosepak she continues to have no fevers or chills had required repeat nebulizer treatments at home. She had been taking her Prednisone for the past 3 days.  Yesterday she was seen by family medicine again complaining of significant anxiety that added BuSpar. She presented to emergency department still complaining of severe shortness of breath to be improved with nebulizer treatments at home Patient reports she usually has more problems breathing and hot environment in the summertime. Reports she is going to quit smoking.   Regarding pertinent Chronic problems: History of COPD on Spiriva and albuterol did not tolerate Breo patient continues to smoke CT scan of the chest in the past have been done showing no evidence of ILD  Hx of DVT in left leg 10 yeas ago was started on daily aspirin and still  takes it.   While in ER: noted to be hypoxic down to 87% needing 3L started on COPD management protocol D.dimer negative CT chest pending Trop neg no chest pain  Following Medications were ordered in ER: Medications  albuterol (PROVENTIL) (2.5 MG/3ML) 0.083% nebulizer solution 5 mg (5 mg Nebulization Given 12/10/17 1713)  methylPREDNISolone sodium succinate (SOLU-MEDROL) 125 mg/2 mL injection 125 mg (125 mg Intravenous Given 12/10/17 1731)  albuterol (PROVENTIL,VENTOLIN) solution continuous neb (10 mg/hr Nebulization Given 12/10/17 1848)  ipratropium (ATROVENT) nebulizer solution 0.5 mg (0.5 mg Nebulization Given 12/10/17 1848)    Significant initial  Findings: Abnormal Labs Reviewed  COMPREHENSIVE METABOLIC PANEL - Abnormal; Notable for the following components:      Result Value   Glucose, Bld 143 (*)    All other components within normal limits  CBC WITH DIFFERENTIAL/PLATELET - Abnormal; Notable for the following components:   Hemoglobin 15.5 (*)    All other components within normal limits     Na 143 K 4.1  Cr    Stable  Lab Results  Component Value Date   CREATININE 0.77 12/10/2017   CREATININE 0.77 11/28/2017   CREATININE 0.90 09/01/2016     Troponin 0.00  d-dimer 0.27   WBC  9.5  HG/HCT stable,     Component Value Date/Time   HGB 15.5 (H) 12/10/2017 1721   HCT 45.0 12/10/2017 1721    Troponin (Point of Care Test) Recent Labs  12/10/17 1728  TROPIPOC 0.00        UA  not ordered     CXR - NON acute COPD  CT chest PE study  No PE, but + mucous plugging.   ECG:  Personally reviewed by me showing: HR : 97 Rhythm:  NSR   no evidence of ischemic changes QTC 450       ED Triage Vitals  Enc Vitals Group     BP 12/10/17 1625 131/89     Pulse Rate 12/10/17 1625 (!) 101     Resp 12/10/17 1625 (!) 25     Temp 12/10/17 1625 98.1 F (36.7 [name])     Temp Source 12/10/17 1625 Oral     SpO2 12/10/17 1625 92 %     Weight --      Height --       Head Circumference --      Peak Flow --      Pain Score 12/10/17 1626 0     Pain Loc --      Pain Edu? --      Excl. in GC? --   TMAX(24)@       Latest  Blood pressure 138/83, pulse 90, temperature 98.1 F (36.7 [name]), temperature source Oral, resp. rate (!) 24, SpO2 95 %.    Hospitalist was called for admission for COPD exacerbation   Review of Systems:    Pertinent positives include: shortness of breath at rest. dyspnea on exertion  Constitutional:  No weight loss, night sweats, Fevers, chills, fatigue, weight loss  HEENT:  No headaches, Difficulty swallowing,Tooth/dental problems,Sore throat,  No sneezing, itching, ear ache, nasal congestion, post nasal drip,  Cardio-vascular:  No chest pain, Orthopnea, PND, anasarca, dizziness, palpitations.no Bilateral lower extremity swelling  GI:  No heartburn, indigestion, abdominal pain, nausea, vomiting, diarrhea, change in bowel habits, loss of appetite, melena, blood in stool, hematemesis Resp:  no , No excess mucus, no productive cough, No non-productive cough, No coughing up of blood.No change in color of mucus.No wheezing. Skin:  no rash or lesions. No jaundice GU:  no dysuria, change in color of urine, no urgency or frequency. No straining to urinate.  No flank pain.  Musculoskeletal:  No joint pain or no joint swelling. No decreased range of motion. No back pain.  Psych:  No change in mood or affect. No depression or anxiety. No memory loss.  Neuro: no localizing neurological complaints, no tingling, no weakness, no double vision, no gait abnormality, no slurred speech, no confusion  All systems reviewed and apart from HOPI all are negative  Past Medical History:   Past Medical History:  Diagnosis Date  . Abnormal nuclear stress test 06/10/2015  . Allergy   . Anxiety   . Arthritis    DDD lumbar; B thumbs  . Asthma    age 67  . Clotting disorder (HCC)   . Depression   . DVT (deep venous thrombosis) (HCC)   . GERD  (gastroesophageal reflux disease)   . Hyperlipidemia   . Hypertension   . Psoriasiform dermatitis       Past Surgical History:  Procedure Laterality Date  . NOSE SURGERY    . TONSILLECTOMY    . TUBAL LIGATION      Social History:  Ambulatory   independently       reports that she has been smoking cigarettes.  She has a 11.50 pack-year smoking history. She has never used smokeless tobacco. She reports that she does  not drink alcohol or use drugs.     Family History:   Family History  Problem Relation Age of Onset  . Cancer Mother 63       colorectal cancer  . Stroke Mother 70       CVA  . Alcohol abuse Mother   . Diabetes Mother   . Hypertension Mother   . Heart disease Father   . Cancer Maternal Grandfather        lung  . Alcohol abuse Brother   . Heart attack Brother   . Cancer Maternal Aunt        breast  . Breast cancer Maternal Aunt 75    Allergies: Allergies  Allergen Reactions  . Acitretin Swelling and Rash  . Pollen Extract Shortness Of Breath    Leaves, pt. reports  . Percodan [Oxycodone-Aspirin]     Pt does not remember  . Statins Other (See Comments)    Muscle aches in legs      Prior to Admission medications   Medication Sig Start Date End Date Taking? Authorizing Provider  albuterol (VENTOLIN HFA) 108 (90 Base) MCG/ACT inhaler inhale 2 puffs by mouth every 4 hours as needed for wheezing or shortness of breath 10/11/17  Yes Wallis Bamberg, PA-[name]  aspirin 325 MG tablet Take 325 mg by mouth daily.   Yes [provider]  betamethasone dipropionate (DIPROLENE) 0.05 % cream  10/14/17  Yes [provider]  busPIRone (BUSPAR) 7.5 MG tablet Take 1 tablet (7.5 mg total) by mouth 2 (two) times daily. Patient taking differently: Take 3.75 mg by mouth 2 (two) times daily.  12/09/17  Yes Sharlene Dory, DO  cholecalciferol (VITAMIN D) 1000 UNITS tablet Take 2,000 Units by mouth daily.    Yes [provider]  escitalopram  (LEXAPRO) 10 MG tablet Take 1 tablet (10 mg total) by mouth daily. 12/07/17  Yes Copland, Gwenlyn Found, MD  ipratropium-albuterol (DUONEB) 0.5-2.5 (3) MG/3ML SOLN Take 3 mLs by nebulization every 6 (six) hours as needed. 12/07/17  Yes Copland, Gwenlyn Found, MD  Melatonin 3 MG CAPS Take 3 mg by mouth at bedtime.   Yes [provider]  Multiple Vitamins-Minerals (ONE-A-DAY WOMENS 50+ ADVANTAGE) TABS Take 1 tablet by mouth daily.   Yes [provider]  predniSONE (DELTASONE) 20 MG tablet Take 20 mg by mouth 2 (two) times daily. 12/06/17  Yes [provider]  Tiotropium Bromide Monohydrate (SPIRIVA RESPIMAT) 2.5 MCG/ACT AERS Inhale 2 puffs into the lungs daily. 08/02/17  Yes Mannam, Colbert Coyer, MD  traZODone (DESYREL) 50 MG tablet take 1/2 to 1 tablet by mouth at bedtime as needed for sleep Patient not taking: Reported on 12/10/2017 08/19/17   Copland, Gwenlyn Found, MD   Physical Exam: Blood pressure 138/83, pulse 90, temperature 98.1 F (36.7 [name]), temperature source Oral, resp. rate (!) 24, SpO2 95 %. 1. General:  in No Acute distress  Chronically ill -appearing 2. Psychological: Alert and   Oriented 3. Head/ENT:    Dry Mucous Membranes                          Head Non traumatic, neck supple                           Poor Dentition 4. SKIN:   decreased Skin turgor,  Skin clean Dry and intact no rash 5. Heart: Regular rate and rhythm no  Murmur, no  Rub or gallop 6. Lungs:  no wheezes or crackles currently   7. Abdomen: Soft, non-tender, Non distended   bowel sounds present 8. Lower extremities: no clubbing, cyanosis, or edema 9. Neurologically Grossly intact, moving all 4 extremities equally slightly tremolous 10. MSK: Normal range of motion   LABS:     Recent Labs  Lab 12/10/17 1721  WBC 9.5  NEUTROABS 7.6  HGB 15.5*  HCT 45.0  MCV 91.5  PLT 322   Basic Metabolic Panel: Recent Labs  Lab 12/10/17 1721  NA 143  K 4.1  CL 109  CO2 24  GLUCOSE 143*  BUN 19    CREATININE 0.77  CALCIUM 9.9      Recent Labs  Lab 12/10/17 1721  AST 23  ALT 24  ALKPHOS 65  BILITOT 0.4  PROT 7.1  ALBUMIN 3.9   No results for input(s): LIPASE, AMYLASE in the last 168 hours. No results for input(s): AMMONIA in the last 168 hours.    HbA1C: No results for input(s): HGBA1C in the last 72 hours. CBG: No results for input(s): GLUCAP in the last 168 hours.    Urine analysis:    Component Value Date/Time   COLORURINE YELLOW 11/24/2013 0222   APPEARANCEUR CLEAR 11/24/2013 0222   LABSPEC 1.013 11/24/2013 0222   PHURINE 7.0 11/24/2013 0222   GLUCOSEU NEGATIVE 11/24/2013 0222   HGBUR NEGATIVE 11/24/2013 0222   HGBUR negative 10/01/2008 1054   BILIRUBINUR negative 04/28/2015 0949   KETONESUR negative 04/28/2015 0949   KETONESUR NEGATIVE 11/24/2013 0222   PROTEINUR negative 04/28/2015 0949   PROTEINUR NEGATIVE 11/24/2013 0222   UROBILINOGEN 0.2 04/28/2015 0949   UROBILINOGEN 0.2 11/24/2013 0222   NITRITE Negative 04/28/2015 0949   NITRITE NEGATIVE 11/24/2013 0222   LEUKOCYTESUR Negative 04/28/2015 0949       Cultures: No results found for: SDES, SPECREQUEST, CULT, REPTSTATUS   Radiological Exams on Admission: Dg Chest 2 View  Result Date: 12/10/2017 CLINICAL DATA:  Patient here from home with complaints of SOB x4 days. Seen at urgent care, advised to come here. Hx of asthma and HTN. Smoker. EXAM: CHEST - 2 VIEW COMPARISON:  11/29/2017 FINDINGS: Linear opacities at the right lung base stable from prior exams consistent scarring. Lungs are hyperexpanded but otherwise clear. No pleural effusion or pneumothorax. Heart, mediastinum hila are unremarkable. Skeletal structures are intact. IMPRESSION: 1. No acute cardiopulmonary disease. 2. Hyperexpanded lungs consistent with COPD. Electronically Signed   By: Amie Portland M.D.   On: 12/10/2017 16:49    Chart has been reviewed    Assessment/Plan   67 y.o. female with medical history significant of  COPD, asthma, depression, tobacco abuse, HTN, HLD anxiety disorder history of DVT     Admitted for COPD exacerbation  Present on Admission: . COPD with acute exacerbation (HCC) -   Will initiate: Steroid taper  -  Antibiotics   Doxycycline, -  XopenexPRN, - scheduled duoneb,  -  Breo or Dulera at discharge   -  Mucinex.  Titrate O2 to saturation >90%. Follow patients respiratory status.  Order respiratory panel and influenza PCR  Currently mentating well no evidence of symptomatic hypercarbia'  . Acute on chronic respiratory failure with hypoxia (HCC) likley due to COPD/asthma exacerbation may need to be on home oxygen  . Essential hypertension  stable . Generalized anxiety disorder -may be contributing to increased work of breathing   continue BuSpar and Lexapro . Hypercholesteremia -stable does not tolerate statins . Tobacco abuse -  -  Spoke about importance of quitting,   - order nicotine patch   - nursing tobacco cessation protocol    Other plan as per orders.  DVT prophylaxis:   Lovenox     Code Status:  FULL CODE   as per patient   I had personally discussed CODE STATUS with patien   Family Communication:   Family not  at  Bedside    Disposition Plan: to home when stable probably will need home o2, care management consulted                   Consults called: pulmonology consulted will see in AM  Admission status:    inpatient      Level of care      medical floor              Therisa Doyne 12/10/2017, 10:11 PM    [facility]  Pager [phone]   after 2 AM please page floor coverage PA If 7AM-7PM, please contact the day team taking care of the patient  [email or web address]  Password [email or web address]

## 2017-12-10 NOTE — ED Notes (Signed)
RN in the floor unable to take report at this time.

## 2017-12-10 NOTE — ED Triage Notes (Signed)
Patient here from home with complaints of SOB x4 days. Seen at urgent care, advised to come here. Hx of asthma.

## 2017-12-10 NOTE — ED Notes (Addendum)
Respiratory in room administering breathing treatment

## 2017-12-11 DIAGNOSIS — R05 Cough: Secondary | ICD-10-CM

## 2017-12-11 DIAGNOSIS — I10 Essential (primary) hypertension: Secondary | ICD-10-CM

## 2017-12-11 DIAGNOSIS — J441 Chronic obstructive pulmonary disease with (acute) exacerbation: Principal | ICD-10-CM

## 2017-12-11 DIAGNOSIS — R0902 Hypoxemia: Secondary | ICD-10-CM

## 2017-12-11 DIAGNOSIS — Z720 Tobacco use: Secondary | ICD-10-CM

## 2017-12-11 DIAGNOSIS — R0602 Shortness of breath: Secondary | ICD-10-CM

## 2017-12-11 LAB — COMPREHENSIVE METABOLIC PANEL
ALBUMIN: 3.5 g/dL (ref 3.5–5.0)
ALT: 21 U/L (ref 0–44)
AST: 26 U/L (ref 15–41)
Alkaline Phosphatase: 58 U/L (ref 38–126)
Anion gap: 7 (ref 5–15)
BUN: 17 mg/dL (ref 8–23)
CALCIUM: 9.4 mg/dL (ref 8.9–10.3)
CHLORIDE: 109 mmol/L (ref 98–111)
CO2: 25 mmol/L (ref 22–32)
CREATININE: 0.79 mg/dL (ref 0.44–1.00)
GFR calc Af Amer: >60 mL/min (ref >60)
GFR calc non Af Amer: >60 mL/min (ref >60)
GLUCOSE: 183 mg/dL — AB (ref 70–99)
Potassium: 3.8 mmol/L (ref 3.5–5.1)
SODIUM: 141 mmol/L (ref 135–145)
Total Bilirubin: 0.4 mg/dL (ref 0.3–1.2)
Total Protein: 6.3 g/dL — ABNORMAL LOW (ref 6.5–8.1)

## 2017-12-11 LAB — CBC
HEMATOCRIT: 41.7 % (ref 36.0–46.0)
HEMOGLOBIN: 14 g/dL (ref 12.0–15.0)
MCH: 31 pg (ref 26.0–34.0)
MCHC: 33.6 g/dL (ref 30.0–36.0)
MCV: 92.3 fL (ref 78.0–100.0)
Platelets: 323 10*3/uL (ref 150–400)
RBC: 4.52 MIL/uL (ref 3.87–5.11)
RDW: 14.5 % (ref 11.5–15.5)
WBC: 12.4 10*3/uL — ABNORMAL HIGH (ref 4.0–10.5)

## 2017-12-11 LAB — PHOSPHORUS: Phosphorus: 2.5 mg/dL (ref 2.5–4.6)

## 2017-12-11 LAB — HIV ANTIBODY (ROUTINE TESTING W REFLEX): HIV SCREEN 4TH GENERATION: NONREACTIVE

## 2017-12-11 LAB — MAGNESIUM: Magnesium: 2.1 mg/dL (ref 1.7–2.4)

## 2017-12-11 LAB — TSH: TSH: 0.958 u[IU]/mL (ref 0.350–4.500)

## 2017-12-11 MED ORDER — TRAZODONE HCL 50 MG PO TABS: 50.0000 mg | ORAL_TABLET | Freq: Every day | ORAL | Status: DC

## 2017-12-11 MED ORDER — BUSPIRONE HCL 5 MG PO TABS
5.0000 mg | ORAL_TABLET | Freq: Every day | ORAL | Status: DC
  Administered 2017-12-11 – 2017-12-12 (×2): 5 mg via ORAL
  Filled 2017-12-11 (×2): qty 1

## 2017-12-11 MED ORDER — NICOTINE 21 MG/24HR TD PT24
21.0000 mg | MEDICATED_PATCH | Freq: Every day | TRANSDERMAL | Status: DC
  Filled 2017-12-11: qty 1

## 2017-12-11 MED ORDER — MOMETASONE FURO-FORMOTEROL FUM 100-5 MCG/ACT IN AERO
2.0000 | INHALATION_SPRAY | Freq: Two times a day (BID) | RESPIRATORY_TRACT | Status: DC
  Administered 2017-12-11 – 2017-12-12 (×2): 2 via RESPIRATORY_TRACT
  Filled 2017-12-11: qty 8.8

## 2017-12-11 MED ORDER — LEVALBUTEROL HCL 1.25 MG/0.5ML IN NEBU: 1.2500 mg | INHALATION_SOLUTION | RESPIRATORY_TRACT | Status: DC | PRN

## 2017-12-11 MED ORDER — IPRATROPIUM-ALBUTEROL 0.5-2.5 (3) MG/3ML IN SOLN
3.0000 mL | Freq: Four times a day (QID) | RESPIRATORY_TRACT | Status: DC
  Administered 2017-12-12 (×2): 3 mL via RESPIRATORY_TRACT
  Filled 2017-12-11 (×2): qty 3

## 2017-12-11 MED ORDER — ONDANSETRON HCL 4 MG/2ML IJ SOLN: 4.0000 mg | Freq: Four times a day (QID) | INTRAMUSCULAR | Status: DC | PRN

## 2017-12-11 MED ORDER — ESCITALOPRAM OXALATE 10 MG PO TABS
10.0000 mg | ORAL_TABLET | Freq: Every day | ORAL | Status: DC
  Administered 2017-12-11 – 2017-12-12 (×2): 10 mg via ORAL
  Filled 2017-12-11 (×2): qty 1

## 2017-12-11 MED ORDER — MELATONIN 3 MG PO TABS
3.0000 mg | ORAL_TABLET | Freq: Every day | ORAL | Status: DC
  Administered 2017-12-11: 3 mg via ORAL
  Filled 2017-12-11: qty 1

## 2017-12-11 MED ORDER — ENOXAPARIN SODIUM 40 MG/0.4ML ~~LOC~~ SOLN
40.0000 mg | Freq: Every day | SUBCUTANEOUS | Status: DC
  Administered 2017-12-11: 40 mg via SUBCUTANEOUS
  Filled 2017-12-11 (×2): qty 0.4

## 2017-12-11 MED ORDER — ONDANSETRON HCL 4 MG PO TABS: 4.0000 mg | ORAL_TABLET | Freq: Four times a day (QID) | ORAL | Status: DC | PRN

## 2017-12-11 MED ORDER — ACETAMINOPHEN 325 MG PO TABS: 650.0000 mg | ORAL_TABLET | Freq: Four times a day (QID) | ORAL | Status: DC | PRN

## 2017-12-11 MED ORDER — MOMETASONE FURO-FORMOTEROL FUM 100-5 MCG/ACT IN AERO: 2.0000 | INHALATION_SPRAY | Freq: Two times a day (BID) | RESPIRATORY_TRACT | Status: DC

## 2017-12-11 MED ORDER — SODIUM CHLORIDE 0.9 % IV SOLN
INTRAVENOUS | Status: AC
  Administered 2017-12-11: 01:00:00 via INTRAVENOUS

## 2017-12-11 MED ORDER — TRAZODONE HCL 50 MG PO TABS
50.0000 mg | ORAL_TABLET | Freq: Once | ORAL | Status: AC
  Administered 2017-12-11: 50 mg via ORAL
  Filled 2017-12-11: qty 1

## 2017-12-11 MED ORDER — GUAIFENESIN ER 600 MG PO TB12
600.0000 mg | ORAL_TABLET | Freq: Two times a day (BID) | ORAL | Status: DC
  Administered 2017-12-11 – 2017-12-12 (×4): 600 mg via ORAL
  Filled 2017-12-11 (×4): qty 1

## 2017-12-11 MED ORDER — TRAZODONE HCL 50 MG PO TABS: 50.0000 mg | ORAL_TABLET | Freq: Every evening | ORAL | Status: DC | PRN

## 2017-12-11 MED ORDER — BUSPIRONE HCL 7.5 MG PO TABS
3.7500 mg | ORAL_TABLET | Freq: Two times a day (BID) | ORAL | Status: DC
  Administered 2017-12-11: 3.75 mg via ORAL
  Filled 2017-12-11 (×3): qty 1

## 2017-12-11 MED ORDER — METHYLPREDNISOLONE SODIUM SUCC 125 MG IJ SOLR
60.0000 mg | Freq: Two times a day (BID) | INTRAMUSCULAR | Status: AC
  Administered 2017-12-11 (×2): 60 mg via INTRAVENOUS
  Filled 2017-12-11 (×3): qty 2

## 2017-12-11 MED ORDER — SODIUM CHLORIDE 0.9 % IV SOLN
100.0000 mg | Freq: Two times a day (BID) | INTRAVENOUS | Status: DC
  Administered 2017-12-11 (×3): 100 mg via INTRAVENOUS
  Filled 2017-12-11 (×5): qty 100

## 2017-12-11 MED ORDER — ACETAMINOPHEN 650 MG RE SUPP: 650.0000 mg | Freq: Four times a day (QID) | RECTAL | Status: DC | PRN

## 2017-12-11 MED ORDER — ASPIRIN 325 MG PO TABS
325.0000 mg | ORAL_TABLET | Freq: Every day | ORAL | Status: DC
  Administered 2017-12-11 – 2017-12-12 (×2): 325 mg via ORAL
  Filled 2017-12-11 (×2): qty 1

## 2017-12-11 MED ORDER — BUSPIRONE HCL 5 MG PO TABS
2.5000 mg | ORAL_TABLET | Freq: Every day | ORAL | Status: DC
  Administered 2017-12-11: 2.5 mg via ORAL
  Filled 2017-12-11: qty 1

## 2017-12-11 MED ORDER — PREDNISONE 20 MG PO TABS: 40.0000 mg | ORAL_TABLET | Freq: Every day | ORAL | Status: DC

## 2017-12-11 NOTE — Consult Note (Signed)
Name: Tasha CanaryJanet P Shiel MRN: [id number] DOB: February 22, 1951    ADMISSION DATE:  12/10/2017 CONSULTATION DATE:  12/11/2017  REFERRING MD :  [facility] - [facility]  CHIEF COMPLAINT:  SOB  BRIEF PATIENT DESCRIPTION: 67 year old female with history of Gold stage III COPD (in 2017, no PFTs since) that is not O2 dependent at home who presents to the hospital with SOB.  Was seen by primary and [facility] and diagnosed as being very anxious and COPD exacerbation respectively.  Was started on prednisone 60 mg PO dose pack 3 days prior and has been taking it but return to the hospital with SOB.  Reports persistent cough with some sputum production.  No fever/chills, hemoptysis, wt lose, PND to orthopnea.  SIGNIFICANT EVENTS  7/13 admission to the hospital for SOB  STUDIES:  CT of the chest that I reviewed myself that reveals emphysema and bronchial cuffing consistent with bronchitis.   HISTORY OF PRESENT ILLNESS:  67 year old female with history of Gold stage III COPD (in 2017, no PFTs since) that is not O2 dependent at home who presents to the hospital with SOB.  Was seen by primary and [facility] and diagnosed as being very anxious and COPD exacerbation respectively.  Was started on prednisone 60 mg PO dose pack 3 days prior and has been taking it but return to the hospital with SOB.  Reports persistent cough with some sputum production.  No fever/chills, hemoptysis, wt lose, PND to orthopnea.   PAST MEDICAL HISTORY :   has a past medical history of Abnormal nuclear stress test (06/10/2015), Allergy, Anxiety, Arthritis, Asthma, Clotting disorder (HCC), Depression, DVT (deep venous thrombosis) (HCC), GERD (gastroesophageal reflux disease), Hyperlipidemia, Hypertension, and Psoriasiform dermatitis.  has a past surgical history that includes Nose surgery; Tonsillectomy; and Tubal ligation. Prior to Admission medications   Medication Sig Start Date End Date Taking? Authorizing Provider  albuterol (VENTOLIN HFA) 108 (90 Base)  MCG/ACT inhaler inhale 2 puffs by mouth every 4 hours as needed for wheezing or shortness of breath 10/11/17  Yes Wallis BambergMani, Mario, PA-C  aspirin 325 MG tablet Take 325 mg by mouth daily.   Yes [provider]  betamethasone dipropionate (DIPROLENE) 0.05 % cream  10/14/17  Yes [provider]  busPIRone (BUSPAR) 7.5 MG tablet Take 1 tablet (7.5 mg total) by mouth 2 (two) times daily. Patient taking differently: Take 3.75 mg by mouth 2 (two) times daily.  12/09/17  Yes Sharlene DoryWendling, Nicholas Paul, DO  cholecalciferol (VITAMIN D) 1000 UNITS tablet Take 2,000 Units by mouth daily.    Yes [provider]  escitalopram (LEXAPRO) 10 MG tablet Take 1 tablet (10 mg total) by mouth daily. 12/07/17  Yes Copland, Gwenlyn FoundJessica C, MD  ipratropium-albuterol (DUONEB) 0.5-2.5 (3) MG/3ML SOLN Take 3 mLs by nebulization every 6 (six) hours as needed. 12/07/17  Yes Copland, Gwenlyn FoundJessica C, MD  Melatonin 3 MG CAPS Take 3 mg by mouth at bedtime.   Yes [provider]  Multiple Vitamins-Minerals (ONE-A-DAY WOMENS 50+ ADVANTAGE) TABS Take 1 tablet by mouth daily.   Yes [provider]  predniSONE (DELTASONE) 20 MG tablet Take 20 mg by mouth 2 (two) times daily. 12/06/17  Yes [provider]  Tiotropium Bromide Monohydrate (SPIRIVA RESPIMAT) 2.5 MCG/ACT AERS Inhale 2 puffs into the lungs daily. 08/02/17  Yes Chilton GreathouseMannam, Praveen, MD   Allergies  Allergen Reactions  . Acitretin Swelling and Rash  . Pollen Extract Shortness Of Breath    Leaves, pt. reports  . Percodan [  Oxycodone-Aspirin]     Pt does not remember  . Statins Other (See Comments)    Muscle aches in legs     FAMILY HISTORY:  family history includes Alcohol abuse in her brother and mother; Breast cancer (age of onset: 21) in her maternal aunt; Cancer in her maternal aunt and maternal grandfather; Cancer (age of onset: 29) in her mother; Diabetes in her mother; Heart attack in her brother; Heart disease in her father; Hypertension in  her mother; Stroke (age of onset: 44) in her mother. SOCIAL HISTORY:  reports that she has been smoking cigarettes.  She has a 11.50 pack-year smoking history. She has never used smokeless tobacco. She reports that she does not drink alcohol or use drugs.  REVIEW OF SYSTEMS:   Constitutional: Negative for fever, chills, weight loss, malaise/fatigue and diaphoresis.  HENT: Negative for hearing loss, ear pain, nosebleeds, congestion, sore throat, neck pain, tinnitus and ear discharge.   Eyes: Negative for blurred vision, double vision, photophobia, pain, discharge and redness.  Respiratory: Negative for cough, hemoptysis, sputum production, shortness of breath, wheezing and stridor.   Cardiovascular: Negative for chest pain, palpitations, orthopnea, claudication, leg swelling and PND.  Gastrointestinal: Negative for heartburn, nausea, vomiting, abdominal pain, diarrhea, constipation, blood in stool and melena.  Genitourinary: Negative for dysuria, urgency, frequency, hematuria and flank pain.  Musculoskeletal: Negative for myalgias, back pain, joint pain and falls.  Skin: Negative for itching and rash.  Neurological: Negative for dizziness, tingling, tremors, sensory change, speech change, focal weakness, seizures, loss of consciousness, weakness and headaches.  Endo/Heme/Allergies: Negative for environmental allergies and polydipsia. Does not bruise/bleed easily.  SUBJECTIVE:   VITAL SIGNS: Temp:  [98.1 F (36.7 C)-98.8 F (37.1 C)] 98.4 F (36.9 C) (07/14 0413) Pulse Rate:  [80-101] 80 (07/14 0413) Resp:  [18-25] 18 (07/14 0413) BP: (126-151)/(76-89) 126/76 (07/14 0413) SpO2:  [88 %-95 %] 89 % (07/14 1100) Weight:  [143 lb 1.3 oz (64.9 kg)] 143 lb 1.3 oz (64.9 kg) (07/13 2346)  PHYSICAL EXAMINATION: General:  Chronically ill appearing female, NAD on RA Neuro:  Alert and interactive, moving all ext to command HEENT:  [place]/AT, PERRL, EOM-I and MMM Cardiovascular:  RRR, Nl S1/S2 and  -M/R/G Lungs:  Decreased BS diffusely, no wheezing. Abdomen:  Soft, NT, ND and +BS Musculoskeletal:  -edema and -tenderness Skin:  Intact  Recent Labs  Lab 12/10/17 1721 12/11/17 0523  NA 143 141  K 4.1 3.8  CL 109 109  CO2 24 25  BUN 19 17  CREATININE 0.77 0.79  GLUCOSE 143* 183*   Recent Labs  Lab 12/10/17 1721 12/11/17 0523  HGB 15.5* 14.0  HCT 45.0 41.7  WBC 9.5 12.4*  PLT 322 323   Dg Chest 2 View  Result Date: 12/10/2017 CLINICAL DATA:  Patient here from home with complaints of SOB x4 days. Seen at urgent care, advised to come here. Hx of asthma and HTN. Smoker. EXAM: CHEST - 2 VIEW COMPARISON:  11/29/2017 FINDINGS: Linear opacities at the right lung base stable from prior exams consistent scarring. Lungs are hyperexpanded but otherwise clear. No pleural effusion or pneumothorax. Heart, mediastinum hila are unremarkable. Skeletal structures are intact. IMPRESSION: 1. No acute cardiopulmonary disease. 2. Hyperexpanded lungs consistent with COPD. Electronically Signed   By: Amie Portland M.D.   On: 12/10/2017 16:49   Ct Angio Chest Pe W And/or Wo Contrast  Result Date: 12/10/2017 CLINICAL DATA:  Patient here from home with complaints of SOB x4 days. Seen at  urgent care, advised to come here. Hx of asthma. EXAM: CT ANGIOGRAPHY CHEST WITH CONTRAST TECHNIQUE: Multidetector CT imaging of the chest was performed using the standard protocol during bolus administration of intravenous contrast. Multiplanar CT image reconstructions and MIPs were obtained to evaluate the vascular anatomy. CONTRAST:  75mL ISOVUE-370 IOPAMIDOL (ISOVUE-370) INJECTION 76% COMPARISON:  Current chest radiographs.  Chest CT, 09/12/2017. FINDINGS: Cardiovascular: Satisfactory opacification of the pulmonary arteries to the segmental level. No evidence of pulmonary embolism. Normal heart size. No pericardial effusion. No coronary artery calcifications. Great vessels normal in caliber. No aortic dissection. Mild  aortic atherosclerosis. Mediastinum/Nodes: No enlarged mediastinal, hilar, or axillary lymph nodes. Thyroid gland, trachea, and esophagus demonstrate no significant findings. Lungs/Pleura: Bronchial wall thickening most evident in the lower lobes. Several lower lobe segmental are filled with mucus/secretions. There are linear and discoid type opacities at the lung bases and in the anteromedial upper lobes and right middle lobe consistent with a combination of atelectasis and scarring. Changes of centrilobular emphysema are stable from the prior CT. No evidence of pulmonary edema. No pleural effusion or pneumothorax. Upper Abdomen: No acute abnormality. Musculoskeletal: No fracture or acute finding. No osteoblastic or osteolytic lesions. Review of the MIP images confirms the above findings. IMPRESSION: 1. No evidence of a pulmonary embolism. 2. Emphysema, bronchial wall thickening and lower lobe bronchial mucous plugging, findings consistent with active bronchial inflammation. No evidence of pneumonia or pulmonary edema. Aortic Atherosclerosis (ICD10-I70.0) and Emphysema (ICD10-J43.9). Electronically Signed   By: Amie Portland M.D.   On: 12/10/2017 21:46    ASSESSMENT / PLAN:  67 year old female with gold stage 3 COPD as above, not O2 dependent who appears very anxious presenting with SOB.  Discussed with PCCM-NP.  SOB: I believe anxiety is a major component here  - Treat with anxiolytics  - Treat COPD as below.  Hypoxemia:  - Titrate O2 for sat of 88-92%  - On RA at rest is 89%, with ambulation on RA down to 87%, patient is very resistant to the idea of home O2 due to cost, once closer to discharge will likely need case manager to assist with O2.  COPD:  - Duonebs  - Will add dulera  - PRN albuterol  Cough:   - Mucinex as ordered  Tobacco abuse:  - Smoking cessation.  PCCM will continue to follow with you.  Alyson Reedy, M.D. [facility] Pulmonary/Critical Care Medicine. Pager:  [phone]. After hours pager: [phone].  12/11/2017, 11:47 AM

## 2017-12-11 NOTE — Evaluation (Signed)
Occupational Therapy Evaluation Patient Details Name: [name] MRN: [id number] DOB: 25-Oct-1950 Today's Date: 12/11/2017    History of Present Illness Tasha [name] is a 67 y.o. female with medical history significant of COPD, asthma, depression, tobacco abuse, HTN, HLD anxiety disorder history of DVT. She presents with dyspnea for the past 2 weeks.    Clinical Impression   Pt up to the bathroom on RA and O2 sats at lowest 89% but tended to be at 90-91%. HR 103 standing. Reapplied O2 at end of session and O2 back up to 93% on 2L. Pt overall at min guard assist level and with no LOB during self care. Pt will benefit from continued energy conservation education and to progress ADL independence.     Follow Up Recommendations  No OT follow up;Supervision - Intermittent    Equipment Recommendations  None recommended by OT    Recommendations for Other Services       Precautions / Restrictions Precautions Precautions: None Precaution Comments: monitor O2 Restrictions Weight Bearing Restrictions: No      Mobility Bed Mobility Overal bed mobility: Modified Independent                Transfers Overall transfer level: Needs assistance Equipment used: None Transfers: Sit to/from Stand Sit to Stand: Supervision         General transfer comment: mostly for lines    Balance                                           ADL either performed or assessed with clinical judgement   ADL Overall ADL's : Needs assistance/impaired Eating/Feeding: Independent;Sitting   Grooming: Wash/dry hands;Min guard;Standing   Upper Body Bathing: Set up;Sitting   Lower Body Bathing: Min guard;Sit to/from stand   Upper Body Dressing : Set up;Sitting   Lower Body Dressing: Min guard;Sit to/from stand   Toilet Transfer: Min guard;Ambulation;Comfort height toilet   Toileting- Clothing Manipulation and Hygiene: Min guard;Sit to/from stand         General ADL  Comments: Pt on 2L from supine 95% HR 88. Sitting on EOB at 89-93% with O2 off. Pt performed functional mobility to the bathroom without O2 and sats at 89% but up to 91% on RA with rest break sitting on commode and encouraged purse lip breathing. Pt with O2 reapplied at end of session and up to 93-94%. HR 103 during standing task. Informed nursing of all. Pt with no LOB and does well with functional mobility with no assistive device. Educated on importance of purse lip breathing and sitting down to take breaks as needed.      Vision Patient Visual Report: No change from baseline       Perception     Praxis      Pertinent Vitals/Pain Pain Assessment: No/denies pain     Hand Dominance     Extremity/Trunk Assessment Upper Extremity Assessment Upper Extremity Assessment: Overall WFL for tasks assessed           Communication Communication Communication: No difficulties   Cognition Arousal/Alertness: Awake/alert Behavior During Therapy: WFL for tasks assessed/performed Overall Cognitive Status: Within Functional Limits for tasks assessed                                     General  Comments       Exercises     Shoulder Instructions      Home Living Family/patient expects to be discharged to:: Private residence Living Arrangements: Alone   Type of Home: House       Home Layout: One level;Other (Comment)(with attic)         Bathroom Toilet: Standard     Home Equipment: None          Prior Functioning/Environment Level of Independence: Independent                 OT Problem List: Decreased strength      OT Treatment/Interventions: Energy conservation    OT Goals(Current goals can be found in the care plan section) Acute Rehab OT Goals Patient Stated Goal: to go home when ready OT Goal Formulation: With patient Time For Goal Achievement: 12/25/17 Potential to Achieve Goals: Good  OT Frequency: Min 2X/week   Barriers to D/C:             Co-evaluation              AM-PAC PT "6 Clicks" Daily Activity     Outcome Measure Help from another person eating meals?: None Help from another person taking care of personal grooming?: A Little Help from another person toileting, which includes using toliet, bedpan, or urinal?: A Little Help from another person bathing (including washing, rinsing, drying)?: A Little Help from another person to put on and taking off regular upper body clothing?: None Help from another person to put on and taking off regular lower body clothing?: A Little 6 Click Score: 20   End of Session Equipment Utilized During Treatment: Oxygen  Activity Tolerance: Patient tolerated treatment well Patient left: in bed;with call bell/phone within reach  OT Visit Diagnosis: Muscle weakness (generalized) (M62.81)                Time: [id number] OT Time Calculation (min): 22 min Charges:  OT General Charges $OT Visit: 1 Visit OT Evaluation $OT Eval Low Complexity: 1 Low G-Codes:       Zannie KehrStephanie S Allan Bacigalupi 12/11/2017, 11:26 AM

## 2017-12-11 NOTE — Progress Notes (Signed)
PROGRESS NOTE    ZAEDA MCFERRAN  ION:[id number] DOB: July 12, 1950 DOA: 12/10/2017 PCP: Pearline Cables, MD   Brief Narrative: ESTHELA BRANDNER is a 67 y.o. female with a history of COPD, depression, anxiety, hypertension, tobacco abuse, DVT. She presented secondary to shortness of breath found to have a COPD exacerbation.   Assessment & Plan:   Active Problems:   Hypercholesteremia   Essential hypertension   Tobacco abuse   Generalized anxiety disorder   COPD with acute exacerbation (HCC)   Acute on chronic respiratory failure with hypoxia (HCC)   COPD exacerbation Improved today. Not at baseline per patient. Significantly anxious. -Continue Duonebs, doxycycline, Solu-medrol/steroids -Continue Doxycycline  Acute respiratory failure with hypoxemia In setting of COPD exacerbation. -Wean to room air; goal SpO2 of >88%  Insomnia -Melatonin qhs  Essential hypertension Mostly controlled. -hydralazine prn  Anxiety Contributing to presentation -Continue BuSpar and Lexapro  Hypercholesterolemia Not on statin secondary to intolerance  Tobacco abuse Patient counseled on admission -Nicotine patch   DVT prophylaxis: Lovenox Code Status:   Code Status: Full Code Family Communication: None at bedside Disposition Plan: Discharge likely in 24 hours if able to wean to room air   Consultants:   Pulmonology  Procedures:   None  Antimicrobials:  Doxycycline    Subjective: Breathing is improved. Still requiring oxygen.  Objective: Vitals:   12/11/17 0413 12/11/17 0906 12/11/17 1100 12/11/17 1250  BP: 126/76   (!) 156/85  Pulse: 80   92  Resp: 18   16  Temp: 98.4 F (36.9 C)   98.3 F (36.8 C)  TempSrc: Oral   Oral  SpO2: 93% 92% (!) 89% 92%  Weight:      Height:        Intake/Output Summary (Last 24 hours) at 12/11/2017 1601 Last data filed at 12/11/2017 0928 Gross per 24 hour  Intake 1747.5 ml  Output -  Net 1747.5 ml   Filed Weights   12/10/17  2346  Weight: 64.9 kg (143 lb 1.3 oz)    Examination:  General exam: Appears calm and comfortable Respiratory system: Clear to auscultation. Respiratory effort normal. Cardiovascular system: S1 & S2 heard, RRR. No murmurs, rubs, gallops or clicks. Gastrointestinal system: Abdomen is nondistended, soft and nontender. Normal bowel sounds heard. Central nervous system: Alert and oriented. No focal neurological deficits. Extremities: No edema. No calf tenderness Skin: No cyanosis. No rashes Psychiatry: Judgement and insight appear normal. Anxious    Data Reviewed: I have personally reviewed following labs and imaging studies  CBC: Recent Labs  Lab 12/10/17 1721 12/11/17 0523  WBC 9.5 12.4*  NEUTROABS 7.6  --   HGB 15.5* 14.0  HCT 45.0 41.7  MCV 91.5 92.3  PLT 322 323   Basic Metabolic Panel: Recent Labs  Lab 12/10/17 1721 12/11/17 0523  NA 143 141  K 4.1 3.8  CL 109 109  CO2 24 25  GLUCOSE 143* 183*  BUN 19 17  CREATININE 0.77 0.79  CALCIUM 9.9 9.4  MG  --  2.1  PHOS  --  2.5   GFR: Estimated Creatinine Clearance: 58.7 mL/min (by C-G formula based on SCr of 0.79 mg/dL). Liver Function Tests: Recent Labs  Lab 12/10/17 1721 12/11/17 0523  AST 23 26  ALT 24 21  ALKPHOS 65 58  BILITOT 0.4 0.4  PROT 7.1 6.3*  ALBUMIN 3.9 3.5   No results for input(s): LIPASE, AMYLASE in the last 168 hours. No results for input(s): AMMONIA in the last 168  hours. Coagulation Profile: No results for input(s): INR, PROTIME in the last 168 hours. Cardiac Enzymes: No results for input(s): CKTOTAL, CKMB, CKMBINDEX, TROPONINI in the last 168 hours. BNP (last 3 results) No results for input(s): PROBNP in the last 8760 hours. HbA1C: No results for input(s): HGBA1C in the last 72 hours. CBG: No results for input(s): GLUCAP in the last 168 hours. Lipid Profile: No results for input(s): CHOL, HDL, LDLCALC, TRIG, CHOLHDL, LDLDIRECT in the last 72 hours. Thyroid Function  Tests: Recent Labs    12/11/17 0523  TSH 0.958   Anemia Panel: No results for input(s): VITAMINB12, FOLATE, FERRITIN, TIBC, IRON, RETICCTPCT in the last 72 hours. Sepsis Labs: No results for input(s): PROCALCITON, LATICACIDVEN in the last 168 hours.  No results found for this or any previous visit (from the past 240 hour(s)).       Radiology Studies: Dg Chest 2 View  Result Date: 12/10/2017 CLINICAL DATA:  Patient here from home with complaints of SOB x4 days. Seen at urgent care, advised to come here. Hx of asthma and HTN. Smoker. EXAM: CHEST - 2 VIEW COMPARISON:  11/29/2017 FINDINGS: Linear opacities at the right lung base stable from prior exams consistent scarring. Lungs are hyperexpanded but otherwise clear. No pleural effusion or pneumothorax. Heart, mediastinum hila are unremarkable. Skeletal structures are intact. IMPRESSION: 1. No acute cardiopulmonary disease. 2. Hyperexpanded lungs consistent with COPD. Electronically Signed   By: Amie Portlandavid  Ormond M.D.   On: 12/10/2017 16:49   Ct Angio Chest Pe W And/or Wo Contrast  Result Date: 12/10/2017 CLINICAL DATA:  Patient here from home with complaints of SOB x4 days. Seen at urgent care, advised to come here. Hx of asthma. EXAM: CT ANGIOGRAPHY CHEST WITH CONTRAST TECHNIQUE: Multidetector CT imaging of the chest was performed using the standard protocol during bolus administration of intravenous contrast. Multiplanar CT image reconstructions and MIPs were obtained to evaluate the vascular anatomy. CONTRAST:  75mL ISOVUE-370 IOPAMIDOL (ISOVUE-370) INJECTION 76% COMPARISON:  Current chest radiographs.  Chest CT, 09/12/2017. FINDINGS: Cardiovascular: Satisfactory opacification of the pulmonary arteries to the segmental level. No evidence of pulmonary embolism. Normal heart size. No pericardial effusion. No coronary artery calcifications. Great vessels normal in caliber. No aortic dissection. Mild aortic atherosclerosis. Mediastinum/Nodes: No  enlarged mediastinal, hilar, or axillary lymph nodes. Thyroid gland, trachea, and esophagus demonstrate no significant findings. Lungs/Pleura: Bronchial wall thickening most evident in the lower lobes. Several lower lobe segmental are filled with mucus/secretions. There are linear and discoid type opacities at the lung bases and in the anteromedial upper lobes and right middle lobe consistent with a combination of atelectasis and scarring. Changes of centrilobular emphysema are stable from the prior CT. No evidence of pulmonary edema. No pleural effusion or pneumothorax. Upper Abdomen: No acute abnormality. Musculoskeletal: No fracture or acute finding. No osteoblastic or osteolytic lesions. Review of the MIP images confirms the above findings. IMPRESSION: 1. No evidence of a pulmonary embolism. 2. Emphysema, bronchial wall thickening and lower lobe bronchial mucous plugging, findings consistent with active bronchial inflammation. No evidence of pneumonia or pulmonary edema. Aortic Atherosclerosis (ICD10-I70.0) and Emphysema (ICD10-J43.9). Electronically Signed   By: Amie Portlandavid  Ormond M.D.   On: 12/10/2017 21:46        Scheduled Meds: . aspirin  325 mg Oral Daily  . busPIRone  5 mg Oral Daily   And  . busPIRone  2.5 mg Oral QHS  . enoxaparin (LOVENOX) injection  40 mg Subcutaneous Daily  . escitalopram  10 mg Oral  Daily  . guaiFENesin  600 mg Oral BID  . ipratropium-albuterol  3 mL Nebulization Q6H  . Melatonin  3 mg Oral QHS  . methylPREDNISolone (SOLU-MEDROL) injection  60 mg Intravenous Q12H   Followed by  . [START ON 12/12/2017] predniSONE  40 mg Oral Q supper  . mometasone-formoterol  2 puff Inhalation BID  . nicotine  14 mg Transdermal Daily  . nicotine  21 mg Transdermal Daily   Continuous Infusions: . doxycycline (VIBRAMYCIN) IV Stopped (12/11/17 1215)     LOS: 1 day     Jacquelin Hawking, MD [facility] 12/11/2017, 4:01 PM Pager: [phone]  If 7PM-7AM, please contact  night-coverage [email or web address] 12/11/2017, 4:01 PM

## 2017-12-11 NOTE — Evaluation (Signed)
Physical Therapy Evaluation Patient Details Name: Tasha [name] MRN: [id number] DOB: [birth year] Today's Date: 12/11/2017   History of Present Illness  Tasha [name] is a 67 y.o. female with medical history significant of COPD, asthma, depression, tobacco abuse, HTN, HLD anxiety disorder history of DVT. She presents with dyspnea for the past 2 weeks.   Clinical Impression  Pt was seen for evaluation of mobility as tolerated with pt on room air.  Noted a decline to 87% with walk on hallway, and notified pulmonologist who had just arrived to see pt.  Her plan is to follow acutely as needed and recommend consideration of cardiac rehab for conditioning as pt will now be a candidate to go home on supplemental O2.  Will focus on endurance with gait, use of tank and progress to high level balance challenges as needed to ck O2 consumption with activity.    Follow Up Recommendations No PT follow up;Other (comment)(cardiac rehab may be appropriate for conditioning )    Equipment Recommendations  None recommended by PT    Recommendations for Other Services       Precautions / Restrictions Precautions Precautions: None Precaution Comments: monitor O2 Restrictions Weight Bearing Restrictions: No      Mobility  Bed Mobility Overal bed mobility: Modified Independent                Transfers Overall transfer level: Modified independent Equipment used: None Transfers: Sit to/from Stand Sit to Stand: Supervision         General transfer comment: managed lines for pt  Ambulation/Gait Ambulation/Gait assistance: Supervision Gait Distance (Feet): 190 Feet Assistive device: None Gait Pattern/deviations: Step-through pattern;Narrow base of support Gait velocity: reduced Gait velocity interpretation: <1.31 ft/sec, indicative of household ambulator General Gait Details: pt walks securely with no LOB  Stairs            Wheelchair Mobility    Modified Rankin (Stroke Patients  Only)       Balance Overall balance assessment: No apparent balance deficits (not formally assessed)                                           Pertinent Vitals/Pain Pain Assessment: No/denies pain    Home Living Family/patient expects to be discharged to:: Private residence Living Arrangements: Alone   Type of Home: House       Home Layout: One level(attic) Home Equipment: None      Prior Function Level of Independence: Independent               Hand Dominance   Dominant Hand: Right    Extremity/Trunk Assessment   Upper Extremity Assessment Upper Extremity Assessment: Overall WFL for tasks assessed    Lower Extremity Assessment Lower Extremity Assessment: Overall WFL for tasks assessed    Cervical / Trunk Assessment Cervical / Trunk Assessment: Normal  Communication   Communication: No difficulties  Cognition Arousal/Alertness: Awake/alert Behavior During Therapy: WFL for tasks assessed/performed Overall Cognitive Status: Within Functional Limits for tasks assessed                                        General Comments General comments (skin integrity, edema, etc.): monitored O2 sats with room air, and pt had decline from 93% at rest to 87% by  end of walk on room air    Exercises     Assessment/Plan    PT Assessment Patient needs continued PT services  PT Problem List Decreased activity tolerance;Decreased mobility;Decreased safety awareness;Cardiopulmonary status limiting activity       PT Treatment Interventions Gait training;Functional mobility training;Therapeutic activities;Therapeutic exercise;Balance training;Neuromuscular re-education;Patient/family education    PT Goals (Current goals can be found in the Care Plan section)  Acute Rehab PT Goals Patient Stated Goal: to go home when ready PT Goal Formulation: With patient Time For Goal Achievement: 12/18/17 Potential to Achieve Goals: Good     Frequency Min 3X/week   Barriers to discharge Decreased caregiver support home alone with O2 sats fluctuating    Co-evaluation               AM-PAC PT "6 Clicks" Daily Activity  Outcome Measure Difficulty turning over in bed (including adjusting bedclothes, sheets and blankets)?: None Difficulty moving from lying on back to sitting on the side of the bed? : None Difficulty sitting down on and standing up from a chair with arms (e.g., wheelchair, bedside commode, etc,.)?: None Help needed moving to and from a bed to chair (including a wheelchair)?: A Little Help needed walking in hospital room?: A Little Help needed climbing 3-5 steps with a railing? : A Little 6 Click Score: 21    End of Session Equipment Utilized During Treatment: Gait belt;Oxygen Activity Tolerance: Patient tolerated treatment well;Other (comment)(returned her to O2 as she was low from gait on hallway) Patient left: in bed;with call bell/phone within reach;Other (comment)(pulmonologist visiting her) Nurse Communication: Mobility status PT Visit Diagnosis: Difficulty in walking, not elsewhere classified (R26.2);Other abnormalities of gait and mobility (R26.89)    Time: 1202-1227 PT Time Calculation (min) (ACUTE ONLY): 25 min   Charges:   PT Evaluation $PT Eval Moderate Complexity: 1 Mod PT Treatments $Gait Training: 8-22 mins   PT G Codes:   PT G-Codes **NOT FOR INPATIENT CLASS** Functional Assessment Tool Used: AM-PAC 6 Clicks Basic Mobility    Ivar DrapeRuth E Avalin Briley 12/11/2017, 12:42 PM   Samul Dadauth Luka Reisch, PT MS Acute Rehab Dept. Number: [facility] [phone] and [facility] [phone]

## 2017-12-12 DIAGNOSIS — F411 Generalized anxiety disorder: Secondary | ICD-10-CM

## 2017-12-12 DIAGNOSIS — J9621 Acute and chronic respiratory failure with hypoxia: Secondary | ICD-10-CM

## 2017-12-12 MED ORDER — FLUTICASONE FUROATE-VILANTEROL 100-25 MCG/INH IN AEPB: 1.0000 | INHALATION_SPRAY | Freq: Every day | RESPIRATORY_TRACT | 0 refills | Status: DC

## 2017-12-12 MED ORDER — PREDNISONE 20 MG PO TABS: 20.0000 mg | ORAL_TABLET | Freq: Two times a day (BID) | ORAL | Status: DC

## 2017-12-12 NOTE — Progress Notes (Signed)
Occupational Therapy Treatment Patient Details Name: Tasha [name] MRN: [id number] DOB: 1950-09-24 Today's Date: 12/12/2017    History of present illness Tasha [name] is a 67 y.o. female with medical history significant of COPD, asthma, depression, tobacco abuse, HTN, HLD anxiety disorder history of DVT. She presents with dyspnea for the past 2 weeks.    OT comments  Pt looking forward to going home with her dog  Follow Up Recommendations  No OT follow up;Supervision - Intermittent    Equipment Recommendations  None recommended by OT    Recommendations for Other Services      Precautions / Restrictions Precautions Precautions: None Precaution Comments: monitor O2 Restrictions Weight Bearing Restrictions: No       Mobility Bed Mobility Overal bed mobility: Modified Independent                Transfers Overall transfer level: Modified independent Equipment used: None                  Balance Overall balance assessment: No apparent balance deficits (not formally assessed)                                         ADL either performed or assessed with clinical judgement   ADL Overall ADL's : Needs assistance/impaired Eating/Feeding: Sitting;Independent   Grooming: Standing;Supervision/safety   Upper Body Bathing: Set up;Sitting   Lower Body Bathing: Supervison/ safety;Sit to/from stand;Cueing for sequencing;Cueing for safety   Upper Body Dressing : Sitting;Set up   Lower Body Dressing: Supervision/safety;Sit to/from stand   Toilet Transfer: Supervision/safety;Ambulation;Comfort height toilet   Toileting- Clothing Manipulation and Hygiene: Supervision/safety;Sit to/from stand   Tub/ Shower Transfer: Tub transfer;Min guard     General ADL Comments: Pt on room air. Oxygen sat 89-91 during activity .  Educated in energy conservation ideas  in prep for DC home     Vision Patient Visual Report: No change from baseline            Cognition Arousal/Alertness: Awake/alert Behavior During Therapy: WFL for tasks assessed/performed Overall Cognitive Status: Within Functional Limits for tasks assessed                                                General Comments pt on room air. Sats 89-91. RN aware    Pertinent Vitals/ Pain       Pain Assessment: No/denies pain     Prior Functioning/Environment              Frequency  Min 2X/week        Progress Toward Goals  OT Goals(current goals can now be found in the care plan section)  Progress towards OT goals: Progressing toward goals     Plan Discharge plan remains appropriate    Co-evaluation                 AM-PAC PT "6 Clicks" Daily Activity     Outcome Measure   Help from another person eating meals?: None Help from another person taking care of personal grooming?: A Little Help from another person toileting, which includes using toliet, bedpan, or urinal?: A Little Help from another person bathing (including washing, rinsing, drying)?: A Little Help from another person to put on  and taking off regular upper body clothing?: None Help from another person to put on and taking off regular lower body clothing?: A Little 6 Click Score: 20    End of Session Equipment Utilized During Treatment: Oxygen  OT Visit Diagnosis: Muscle weakness (generalized) (M62.81)   Activity Tolerance Patient tolerated treatment well   Patient Left in bed;with call bell/phone within reach   Nurse Communication Mobility status        Time: 1010-1026 OT Time Calculation (min): 16 min  Charges: OT General Charges $OT Visit: 1 Visit OT Treatments $Self Care/Home Management : 8-22 mins  [place]ville[place]ori Safira Proffit, [place]OT [phone]   Alba CoryREDDING, Malike Foglio D 12/12/2017, 10:52 AM

## 2017-12-12 NOTE — Progress Notes (Signed)
Nutrition Brief Note  RD consulted via COPD gold protocol.   Weight is stable. Pt consuming 75-100% of meals.  Wt Readings from Last 15 Encounters:  12/10/17 143 lb 1.3 oz (64.9 kg)  12/09/17 147 lb (66.7 kg)  12/07/17 147 lb (66.7 kg)  11/29/17 158 lb 12.8 oz (72 kg)  11/28/17 148 lb (67.1 kg)  10/11/17 142 lb 9.6 oz (64.7 kg)  06/27/17 148 lb 6.4 oz (67.3 kg)  02/15/17 145 lb 9.6 oz (66 kg)  09/01/16 145 lb 3.2 oz (65.9 kg)  08/24/16 146 lb 12.8 oz (66.6 kg)  05/11/16 149 lb 3.2 oz (67.7 kg)  04/08/16 143 lb 9.6 oz (65.1 kg)  12/05/15 138 lb 9.6 oz (62.9 kg)  10/04/15 140 lb (63.5 kg)  09/03/15 192 lb (87.1 kg)    Body mass index is 26.17 kg/m. Patient meets criteria for overweight based on current BMI.   Current diet order is regular, patient is consuming approximately 100% of meals at this time. Labs and medications reviewed.   No nutrition interventions warranted at this time. If nutrition issues arise, please consult RD.   Tilda FrancoLindsey Arina Torry, MS, RD, LDN [facility] Clinical Dietitian Pager: [phone] After Hours Pager: [phone]

## 2017-12-12 NOTE — Progress Notes (Addendum)
CM consult for COPD Gold protocol. Pt does not qualify for COPD Gold protocol.  Benefits Check for inhalers per MD request: Elwin Sleightulera: $156.90 Symbicort $45 Breo $45 Advair $45  Information given to MD. Sandford CrazeNora Lynasia Meloche RN,BSN,NCM [phone]

## 2017-12-12 NOTE — Progress Notes (Signed)
[facility] rep alerted of need for home 02. Jentry Mcqueary RN,BSN,NCM [phone] 

## 2017-12-12 NOTE — Discharge Summary (Addendum)
Physician Discharge Summary  Tasha [name] DOB: 1951/03/19 DOA: 12/10/2017  PCP: Tasha [name]  Admit date: 12/10/2017 Discharge date: 12/12/2017  Admitted From: Home Disposition: Home  Recommendations for Outpatient Follow-up:  1. Follow up with PCP in 1 week 2. Follow up with pulmonology 3. Pulmonary function testing 4. Anxiety management 5. Please follow up on the following pending results: None  Home Health: None Equipment/Devices: None  Discharge Condition: Stable CODE STATUS: Full code Diet recommendation: Heart healthy   Brief/Interim Summary:  Admission HPI written by Tasha Doyne, [name]   HPI: Tasha [name] is a 67 y.o. female with medical history significant of COPD, asthma, depression, tobacco abuse, HTN, HLD anxiety disorder history of DVT    Presented with   Dyspnea for past 2 weeks She was first seen by PCP on 2 July complaining shortness of breath she was started on propranolol for management of her anxiety. On 2 July chest x-ray was done showing chronic bronchitic changes with stable bibasilar scarring no pneumonia or CHF she was given Atrovent nebulizer Xopenex and diagnosed with COPD exacerbation. She has had intermittent episodes of hypoxia picked up at the office but refused going to ER at the time. To have intermittent episodes of severe shortness of breath presented to [facility] on 9 July and was started on 60 of prednisone and given 5 days Dosepak she continues to have no fevers or chills had required repeat nebulizer treatments at home. She had been taking her Prednisone for the past 3 days.  Yesterday she was seen by family medicine again complaining of significant anxiety that added BuSpar. She presented to emergency department still complaining of severe shortness of breath to be improved with nebulizer treatments at home Patient reports she usually has more problems breathing and hot environment in the  summertime. Reports she is going to quit smoking.    Hospital course:  COPD exacerbation Improved today. Not at baseline per patient. Significantly anxious. Treated with steroids, doxycycline and Duonebs. Pulmonology consulted and added Dulera, which was switched to [facility] on discharge secondary to cost. Continue previously prescribed prednisone burst. PFTs as an outpatient. Patient was ambulated and required 2 L of oxygen via [place] for oxygen desaturation to 87%.  Acute respiratory failure with hypoxemia In setting of COPD exacerbation. Weaned to room air. Qualified for home oxygen as mentioned above.  Insomnia Melatonin at night.  Essential hypertension Mostly controlled. Hydralazine as needed while inpatient.  Anxiety Contributing to presentation. Continued BuSpar and Lexapro  Hypercholesterolemia Not on statin secondary to intolerance  Tobacco abuse Patient counseled on admission. Recommend nicotine patch OTC    Discharge Diagnoses:  Active Problems:   Hypercholesteremia   Essential hypertension   Tobacco abuse   Generalized anxiety disorder   COPD with acute exacerbation (HCC)   Acute on chronic respiratory failure with hypoxia [facility])    Discharge Instructions  Discharge Instructions    Call [name] for:  difficulty breathing, headache or visual disturbances   Complete by:  As directed    Call [name] for:  extreme fatigue   Complete by:  As directed    Diet - low sodium heart healthy   Complete by:  As directed    Increase activity slowly   Complete by:  As directed      Allergies as of 12/12/2017      Reactions   Acitretin Swelling, Rash   Pollen Extract Shortness Of Breath   Leaves, pt. reports  Percodan [oxycodone-aspirin]    Pt does not remember   Statins Other (See Comments)   Muscle aches in legs      Medication List    STOP taking these medications   traZODone 50 MG tablet Commonly known as:  DESYREL     TAKE these medications    albuterol 108 (90 Base) MCG/ACT inhaler Commonly known as:  VENTOLIN HFA inhale 2 puffs by mouth every 4 hours as needed for wheezing or shortness of breath   aspirin 325 MG tablet Take 325 mg by mouth daily.   betamethasone dipropionate 0.05 % cream Commonly known as:  DIPROLENE   busPIRone 7.5 MG tablet Commonly known as:  BUSPAR Take 1 tablet (7.5 mg total) by mouth 2 (two) times daily. What changed:  how much to take   cholecalciferol 1000 units tablet Commonly known as:  VITAMIN D Take 2,000 Units by mouth daily.   escitalopram 10 MG tablet Commonly known as:  LEXAPRO Take 1 tablet (10 mg total) by mouth daily.   fluticasone furoate-vilanterol 100-25 MCG/INH Aepb Commonly known as:  BREO ELLIPTA Inhale 1 puff into the lungs daily.   ipratropium-albuterol 0.5-2.5 (3) MG/3ML Soln Commonly known as:  DUONEB Take 3 mLs by nebulization every 6 (six) hours as needed.   Melatonin 3 MG Caps Take 3 mg by mouth at bedtime.   ONE-A-DAY WOMENS 50+ ADVANTAGE Tabs Take 1 tablet by mouth daily.   predniSONE 20 MG tablet Commonly known as:  DELTASONE Take 1 tablet (20 mg total) by mouth 2 (two) times daily for 2 days. Start taking on:  12/13/2017   Tiotropium Bromide Monohydrate 2.5 MCG/ACT Aers Commonly known as:  SPIRIVA RESPIMAT Inhale 2 puffs into the lungs daily.      Follow-up Information    [name], Tasha Found, [name]. Schedule an appointment as soon as possible for a visit in 1 week(s).   Specialty:  Family Medicine Contact information: [address] [place] [address] [phone]        Tasha Grice, [name]. Schedule an appointment as soon as possible for a visit in 1 week(s).   Specialty:  Pulmonary Disease Why:  Pulmonary function tests Contact information: [address] Mail Stop [phone] [place] [address] [phone]          Allergies  Allergen Reactions  . Acitretin Swelling and Rash  . Pollen Extract Shortness Of  Breath    Leaves, pt. reports  . Percodan [Oxycodone-Aspirin]     Pt does not remember  . Statins Other (See Comments)    Muscle aches in legs     Consultations:  Pulmonology   Procedures/Studies: Dg Chest 2 View  Result Date: 12/10/2017 CLINICAL DATA:  Patient here from home with complaints of SOB x4 days. Seen at urgent care, advised to come here. Hx of asthma and HTN. Smoker. EXAM: CHEST - 2 VIEW COMPARISON:  11/29/2017 FINDINGS: Linear opacities at the right lung base stable from prior exams consistent scarring. Lungs are hyperexpanded but otherwise clear. No pleural effusion or pneumothorax. Heart, mediastinum hila are unremarkable. Skeletal structures are intact. IMPRESSION: 1. No acute cardiopulmonary disease. 2. Hyperexpanded lungs consistent with COPD. Electronically Signed   By: Amie Portland M.D.   On: 12/10/2017 16:49   Dg Chest 2 View  Result Date: 11/29/2017 CLINICAL DATA:  Cough and shortness of breath EXAM: CHEST - 2 VIEW COMPARISON:  CT scan of the chest of September 12, 2017 and chest x-ray of May 11, 2016. FINDINGS: The lungs are adequately inflated. The lung markings are coarse at both bases but this is stable. The heart and pulmonary vascularity are normal. The mediastinum is normal in width. There is calcification in the wall of the aortic arch. There is no pleural effusion. There is multilevel degenerative disc disease of the thoracic spine. IMPRESSION: Chronic bronchitic changes with stable bibasilar scarring. No pneumonia nor CHF. Thoracic aortic atherosclerosis. Electronically Signed   By: David  [place] M.D.   On: 11/29/2017 10:09   Ct Angio Chest Pe W And/or Wo Contrast  Result Date: 12/10/2017 CLINICAL DATA:  Patient here from home with complaints of SOB x4 days. Seen at urgent care, advised to come here. Hx of asthma. EXAM: CT ANGIOGRAPHY CHEST WITH CONTRAST TECHNIQUE: Multidetector CT imaging of the chest was performed using the standard protocol during bolus  administration of intravenous contrast. Multiplanar CT image reconstructions and MIPs were obtained to evaluate the vascular anatomy. CONTRAST:  75mL ISOVUE-370 IOPAMIDOL (ISOVUE-370) INJECTION 76% COMPARISON:  Current chest radiographs.  Chest CT, 09/12/2017. FINDINGS: Cardiovascular: Satisfactory opacification of the pulmonary arteries to the segmental level. No evidence of pulmonary embolism. Normal heart size. No pericardial effusion. No coronary artery calcifications. Great vessels normal in caliber. No aortic dissection. Mild aortic atherosclerosis. Mediastinum/Nodes: No enlarged mediastinal, hilar, or axillary lymph nodes. Thyroid gland, trachea, and esophagus demonstrate no significant findings. Lungs/Pleura: Bronchial wall thickening most evident in the lower lobes. Several lower lobe segmental are filled with mucus/secretions. There are linear and discoid type opacities at the lung bases and in the anteromedial upper lobes and right middle lobe consistent with a combination of atelectasis and scarring. Changes of centrilobular emphysema are stable from the prior CT. No evidence of pulmonary edema. No pleural effusion or pneumothorax. Upper Abdomen: No acute abnormality. Musculoskeletal: No fracture or acute finding. No osteoblastic or osteolytic lesions. Review of the MIP images confirms the above findings. IMPRESSION: 1. No evidence of a pulmonary embolism. 2. Emphysema, bronchial wall thickening and lower lobe bronchial mucous plugging, findings consistent with active bronchial inflammation. No evidence of pneumonia or pulmonary edema. Aortic Atherosclerosis (ICD10-I70.0) and Emphysema (ICD10-J43.9). Electronically Signed   By: Amie Portland M.D.   On: 12/10/2017 21:46      Subjective: No dyspnea  Discharge Exam: Vitals:   12/12/17 0544 12/12/17 0807  BP: 126/76   Pulse: 80   Resp: 20   Temp: 98.7 F (37.1 C)   SpO2: 96% 92%   Vitals:   12/11/17 2052 12/11/17 2234 12/12/17 0544  12/12/17 0807  BP:  125/73 126/76   Pulse:  84 80   Resp:  16 20   Temp:  99.2 F (37.3 C) 98.7 F (37.1 C)   TempSrc:  Oral Oral   SpO2: 90% 95% 96% 92%  Weight:      Height:        General: Pt is alert, awake, not in acute distress Cardiovascular: RRR, S1/S2 +, no rubs, no gallops Respiratory: CTA bilaterally, no wheezing, no rhonchi Abdominal: Soft, NT, ND, bowel sounds + Extremities: no edema, no cyanosis    The results of significant diagnostics from this hospitalization (including imaging, microbiology, ancillary and laboratory) are listed below for reference.     Microbiology: No results found for this or any previous visit (from the past 240 hour(s)).   Labs: BNP (last 3 results) No results for input(s): BNP in the last 8760 hours. Basic Metabolic Panel: Recent Labs  Lab 12/10/17 1721 12/11/17 0523  NA 143  141  K 4.1 3.8  CL 109 109  CO2 24 25  GLUCOSE 143* 183*  BUN 19 17  CREATININE 0.77 0.79  CALCIUM 9.9 9.4  MG  --  2.1  PHOS  --  2.5   Liver Function Tests: Recent Labs  Lab 12/10/17 1721 12/11/17 0523  AST 23 26  ALT 24 21  ALKPHOS 65 58  BILITOT 0.4 0.4  PROT 7.1 6.3*  ALBUMIN 3.9 3.5   No results for input(s): LIPASE, AMYLASE in the last 168 hours. No results for input(s): AMMONIA in the last 168 hours. CBC: Recent Labs  Lab 12/10/17 1721 12/11/17 0523  WBC 9.5 12.4*  NEUTROABS 7.6  --   HGB 15.5* 14.0  HCT 45.0 41.7  MCV 91.5 92.3  PLT 322 323   Cardiac Enzymes: No results for input(s): CKTOTAL, CKMB, CKMBINDEX, TROPONINI in the last 168 hours. BNP: Invalid input(s): POCBNP CBG: No results for input(s): GLUCAP in the last 168 hours. D-Dimer Recent Labs    12/10/17 1721  DDIMER 0.27   Hgb A1c No results for input(s): HGBA1C in the last 72 hours. Lipid Profile No results for input(s): CHOL, HDL, LDLCALC, TRIG, CHOLHDL, LDLDIRECT in the last 72 hours. Thyroid function studies Recent Labs    12/11/17 0523  TSH  0.958   Anemia work up No results for input(s): VITAMINB12, FOLATE, FERRITIN, TIBC, IRON, RETICCTPCT in the last 72 hours. Urinalysis    Component Value Date/Time   COLORURINE YELLOW 11/24/2013 0222   APPEARANCEUR CLEAR 11/24/2013 0222   LABSPEC 1.013 11/24/2013 0222   PHURINE 7.0 11/24/2013 0222   GLUCOSEU NEGATIVE 11/24/2013 0222   HGBUR NEGATIVE 11/24/2013 0222   HGBUR negative 10/01/2008 1054   BILIRUBINUR negative 04/28/2015 0949   KETONESUR negative 04/28/2015 0949   KETONESUR NEGATIVE 11/24/2013 0222   PROTEINUR negative 04/28/2015 0949   PROTEINUR NEGATIVE 11/24/2013 0222   UROBILINOGEN 0.2 04/28/2015 0949   UROBILINOGEN 0.2 11/24/2013 0222   NITRITE Negative 04/28/2015 0949   NITRITE NEGATIVE 11/24/2013 0222   LEUKOCYTESUR Negative 04/28/2015 0949     SIGNED:   Jacquelin Hawkingalph Lian Pounds, [name] [facility] 12/12/2017, 12:10 PM

## 2017-12-12 NOTE — Progress Notes (Signed)
Name: Tasha CanaryJanet P [name] MRN: [id number] DOB: [birth year]    ADMISSION DATE:  12/10/2017 CONSULTATION DATE:  12/11/2017  REFERRING MD :  [facility] - Netty  CHIEF COMPLAINT:  SOB  BRIEF PATIENT DESCRIPTION: 67 year old female with history of Gold stage III COPD (in 2017, no PFTs since) that is not O2 dependent at home who presents to the hospital with SOB.  Was seen by primary and [facility] and diagnosed as being very anxious and COPD exacerbation respectively.  Was started on prednisone 60 mg PO dose pack 3 days prior and has been taking it but return to the hospital with SOB.  Reports persistent cough with some sputum production.  No fever/chills, hemoptysis, wt lose, PND to orthopnea.  SIGNIFICANT EVENTS  7/13 admission to the hospital for SOB  STUDIES:  CT of the chest that I reviewed myself that reveals emphysema and bronchial cuffing consistent with bronchitis.   HISTORY OF PRESENT ILLNESS:  67 year old female with history of Gold stage III COPD (in 2017, no PFTs since) that is not O2 dependent at home who presents to the hospital with SOB.  Was seen by primary and [facility] and diagnosed as being very anxious and COPD exacerbation respectively.  Was started on prednisone 60 mg PO dose pack 3 days prior and has been taking it but return to the hospital with SOB.  Reports persistent cough with some sputum production.  No fever/chills, hemoptysis, wt lose, PND to orthopnea.  SUBJECTIVE:  No events overnight, no new complaints, now agreeable to oxygen if needed but only concern is cost.  VITAL SIGNS: Temp:  [98.3 F (36.8 C)-99.2 F (37.3 C)] 98.7 F (37.1 C) (07/15 0544) Pulse Rate:  [80-92] 80 (07/15 0544) Resp:  [16-20] 20 (07/15 0544) BP: (125-156)/(73-85) 126/76 (07/15 0544) SpO2:  [89 %-96 %] 92 % (07/15 0807)  PHYSICAL EXAMINATION: General:  Chronically ill appearing female, NAD Neuro:  Alert and interactive, moving all ext to command HEENT:  [place]/AT, PERRL, EOM-I and  MMM Cardiovascular:  RRR, Nl S1/S2 and -M/R/G. Lungs:  Decreased BS diffusely Abdomen:  Soft, NT, ND and +BS Musculoskeletal:  -edema and -tenderness Skin:  Intact  Recent Labs  Lab 12/10/17 1721 12/11/17 0523  NA 143 141  K 4.1 3.8  CL 109 109  CO2 24 25  BUN 19 17  CREATININE 0.77 0.79  GLUCOSE 143* 183*   Recent Labs  Lab 12/10/17 1721 12/11/17 0523  HGB 15.5* 14.0  HCT 45.0 41.7  WBC 9.5 12.4*  PLT 322 323   Dg Chest 2 View  Result Date: 12/10/2017 CLINICAL DATA:  Patient here from home with complaints of SOB x4 days. Seen at urgent care, advised to come here. Hx of asthma and HTN. Smoker. EXAM: CHEST - 2 VIEW COMPARISON:  11/29/2017 FINDINGS: Linear opacities at the right lung base stable from prior exams consistent scarring. Lungs are hyperexpanded but otherwise clear. No pleural effusion or pneumothorax. Heart, mediastinum hila are unremarkable. Skeletal structures are intact. IMPRESSION: 1. No acute cardiopulmonary disease. 2. Hyperexpanded lungs consistent with COPD. Electronically Signed   By: Amie Portlandavid  Ormond M.D.   On: 12/10/2017 16:49   Ct Angio Chest Pe W And/or Wo Contrast  Result Date: 12/10/2017 CLINICAL DATA:  Patient here from home with complaints of SOB x4 days. Seen at urgent care, advised to come here. Hx of asthma. EXAM: CT ANGIOGRAPHY CHEST WITH CONTRAST TECHNIQUE: Multidetector CT imaging of the chest was performed using the standard protocol during bolus  administration of intravenous contrast. Multiplanar CT image reconstructions and MIPs were obtained to evaluate the vascular anatomy. CONTRAST:  75mL ISOVUE-370 IOPAMIDOL (ISOVUE-370) INJECTION 76% COMPARISON:  Current chest radiographs.  Chest CT, 09/12/2017. FINDINGS: Cardiovascular: Satisfactory opacification of the pulmonary arteries to the segmental level. No evidence of pulmonary embolism. Normal heart size. No pericardial effusion. No coronary artery calcifications. Great vessels normal in caliber. No  aortic dissection. Mild aortic atherosclerosis. Mediastinum/Nodes: No enlarged mediastinal, hilar, or axillary lymph nodes. Thyroid gland, trachea, and esophagus demonstrate no significant findings. Lungs/Pleura: Bronchial wall thickening most evident in the lower lobes. Several lower lobe segmental are filled with mucus/secretions. There are linear and discoid type opacities at the lung bases and in the anteromedial upper lobes and right middle lobe consistent with a combination of atelectasis and scarring. Changes of centrilobular emphysema are stable from the prior CT. No evidence of pulmonary edema. No pleural effusion or pneumothorax. Upper Abdomen: No acute abnormality. Musculoskeletal: No fracture or acute finding. No osteoblastic or osteolytic lesions. Review of the MIP images confirms the above findings. IMPRESSION: 1. No evidence of a pulmonary embolism. 2. Emphysema, bronchial wall thickening and lower lobe bronchial mucous plugging, findings consistent with active bronchial inflammation. No evidence of pneumonia or pulmonary edema. Aortic Atherosclerosis (ICD10-I70.0) and Emphysema (ICD10-J43.9). Electronically Signed   By: Amie Portland M.D.   On: 12/10/2017 21:46   I reviewed CXR myself, hyperinflation noted  ASSESSMENT / PLAN:  67 year old female with gold stage 3 COPD as above, not O2 dependent at home presenting very anxious and SOB.  Was hypoxemic with activity yesterday but seems improved today.  Discussed with [facility]-MD and PCCM-NP.  SOB:   - Anxiolytics as ordered  - Treat COPD as below  Hypoxemia:  - Titrate O2 for sat of 88-92%  - Feels better today and off O2, will repeat ambulatory desaturation study again today, if <=88% then will have case management arrange for home, cos is a major concern for patient but she is willing to discuss that with the case manager.  COPD:  - Duonebs as ordered  Elwin Sleight, and should be added to her home medications prior to discharge (or any other  ICS with LABA).  - PRN xopenex  - Will need repeat PFTs as outpatient upon discharge  Cough:   - Mucinex as ordered  Tobacco abuse:  - Discussed smoking cessation and counseled   PCCM will sign off, please call back if needed.  Alyson Reedy, M.D. [facility] Pulmonary/Critical Care Medicine. Pager: [phone]. After hours pager: [phone].  12/12/2017, 9:44 AM

## 2017-12-12 NOTE — Discharge Instructions (Signed)
Tasha CroonJanet P [name],  You were admitted for a COPD exacerbation. You will be discharged on a continued steroid treatment for the next two days. You have been started on Breo Ellipta to control your COPD on a day-to-day bases. Please follow-up with your PCP and with your pulmonologist for pulmonary function testing.

## 2017-12-12 NOTE — Progress Notes (Signed)
SATURATION QUALIFICATIONS: (This note is used to comply with regulatory documentation for home oxygen)  Patient Saturations on Room Air at Rest = 92%  Patient Saturations on Room Air while Ambulating = 87%  Patient Saturations on 2Liters of oxygen while Ambulating = 97%  Please briefly explain why patient needs home oxygen: Pt has Hx of COPD and anxiety. Home Oxygen could help reduce hospitalizations during minor exacerbations.

## 2017-12-14 ENCOUNTER — Telehealth: Payer: Self-pay

## 2017-12-14 ENCOUNTER — Ambulatory Visit: Payer: Medicare Other | Admitting: Family Medicine

## 2017-12-14 NOTE — Telephone Encounter (Signed)
12/14/17   Transition Care Management Follow-up Telephone Call  ADMISSION DATE: 12/10/17  DISCHARGE DATE: 12/12/17  How have you been since you were released from the hospital? Feeling good per patient.   Do you understand why you were in the hospital? Yes   Do you understand the discharge instrcutions? Yes    Items Reviewed: Medications reviewed: Yes  Allergies reviewed:Yes   Dietary changes reviewed: Heart healthy, Low sodium   Referrals reviewed: Appointment scheduled   Functional Questionnaire:   Activities of Daily Living (ADLs): Patient can perform all independetly  Any patient concerns? None at this time.   Confirmed importance and date/time of follow-up visits scheduled:Yes   Confirmed with patient if condition begins to worsen call PCP or go to the ER. Yes   Patient was given the office number and encouragred to call back with questions or concerns.Yes

## 2017-12-14 NOTE — Progress Notes (Signed)
[facility] [address], [address] [place], [place] [address] [phone]  Date:  12/15/2017   Name:  Tasha [name]   DOB:  1950-12-16   MRN:  [id number]  PCP:  Pearline Cables, MD    Chief Complaint: Hospitalization Follow-up (respiratory disstress)   History of Present Illness:  Tasha [name] is a 67 y.o. very pleasant female patient who presents with the following:  Hospital follow-up today;  Admit date: 12/10/2017 Discharge date: 12/12/2017  Recommendations for Outpatient Follow-up:  1. Follow up with PCP in 1 week 2. Follow up with pulmonology 3. Pulmonary function testing 4. Anxiety management 5. Please follow up on the following pending results: None  HPI: [name] a 67 y.o.femalewith medical history significant of COPD, asthma, depression, tobacco abuse, HTN, HLDanxiety disorderhistory of DVT Presented withDyspnea for past2 weeks She was first seen by PCP on 2 July complaining shortness of breath she was started on propranolol for management of her anxiety. On 2 July chest x-ray was done showing chronic bronchitic changes with stable bibasilar scarring no pneumonia or CHF she was given Atrovent nebulizer Xopenex and diagnosed with COPD exacerbation. She has had intermittent episodes of hypoxia picked up at the office but refused going to ER at the time. To have intermittent episodes of severe shortness of breath presented to [facility] on 9 July and was started on 60 of prednisone and given 5 days Dosepak she continues to have no fevers or chills had required repeat nebulizer treatmentsat home.She had been taking her Prednisone for the past 3 days. Yesterday she was seen by family medicine again complaining of significant anxiety that added BuSpar. She presented to emergency department still complaining of severe shortness of breath to be improved with nebulizer treatments at home Patient  reports she usually has more problems breathing and hot environment in the summertime. Reports she is going to quit smoking.  Hospital course:  COPD exacerbation Improved today. Not at baseline per patient. Significantly anxious. Treated with steroids, doxycycline and Duonebs. Pulmonology consulted and added Dulera, which was switched to [facility] on discharge secondary to cost. Continue previously prescribed prednisone burst. PFTs as an outpatient. Patient was ambulated and required 2 L of oxygen via [place] for oxygen desaturation to 87%.  Acute respiratory failure with hypoxemia In setting of COPD exacerbation. Weaned to room air. Qualified for home oxygen as mentioned above.  Insomnia Melatonin at night.  Essential hypertension Mostly controlled. Hydralazine as needed while inpatient.  Anxiety Contributing to presentation. Continued BuSpar and Lexapro  Hypercholesterolemia Not on statin secondary to intolerance  Tobacco abuse Patient counseled on admission. Recommend nicotine patch OTC Discharge Diagnoses:  Active Problems:   Hypercholesteremia   Essential hypertension   Tobacco abuse   Generalized anxiety disorder   COPD with acute exacerbation (HCC)   Acute on chronic respiratory failure with hypoxia (HCC)  She has home oxygen- a big tank for home and portable to take with her She is wearing oxygen when she feels like she needs it during the day or night  She is using her dulera from the hospital but she will change to breo once she finishes up her current rx She also has spiriva at home   She is on buspar for anxiety and does need a refill   She does have a pulmonology appt coming up but I cannot see this  She finished up the prednisoen just recently  No fever She is not really coughing She is still feeling SOB  A nurse friend showed her how to auto- PEEP and she is using this technique as needed She has not had a cig in 5 days- congratulated her!    Patient Active Problem List   Diagnosis Date Noted  . COPD with acute exacerbation (HCC) 12/10/2017  . Acute on chronic respiratory failure with hypoxia (HCC) 12/10/2017  . Generalized anxiety disorder 12/09/2017  . COPD, group C, by GOLD 2017 classification (HCC) 08/24/2016  . Nut allergy 09/03/2015  . Abnormal nuclear stress test 06/10/2015  . Family history of colorectal cancer 05/27/2015  . Tobacco abuse 05/27/2015  . 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) 04/28/2015  . Essential hypertension 04/24/2014  . DVT (deep venous thrombosis) (HCC) 03/10/2012  . 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 Pre-diabetes 02/10/2009  . ENDOMETRIAL POLYP 10/01/2008  . Hypercholesteremia 04/17/2008  . DEPRESSION 04/17/2008  . ASTHMA 04/17/2008  . SCOLIOSIS 04/17/2008  . COLONIC POLYPS, HX OF 04/17/2008    Past Medical History:  Diagnosis Date  . Abnormal nuclear stress test 06/10/2015  . Allergy   . Anxiety   . Arthritis    DDD lumbar; B thumbs  . Asthma    age 34  . Clotting disorder (HCC)   . Depression   . DVT (deep venous thrombosis) (HCC)   . GERD (gastroesophageal reflux disease)   . Hyperlipidemia   . Hypertension   . Psoriasiform dermatitis     Past Surgical History:  Procedure Laterality Date  . NOSE SURGERY    . TONSILLECTOMY    . TUBAL LIGATION      Social History   Tobacco Use  . Smoking status: Current Every Day Smoker    Packs/day: 0.25    Years: 46.00    Pack years: 11.50    Types: Cigarettes  . Smokeless tobacco: Never Used  Substance Use Topics  . Alcohol use: No  . Drug use: No    Family History  Problem Relation Age of Onset  . Cancer Mother 31       colorectal cancer  . Stroke Mother 43       CVA  . Alcohol abuse Mother   . Diabetes Mother   . Hypertension Mother   . Heart disease Father   . Cancer Maternal Grandfather        lung  . Alcohol abuse Brother   . Heart attack Brother   . Cancer Maternal Aunt        breast  . Breast cancer  Maternal Aunt 75    Allergies  Allergen Reactions  . Acitretin Swelling and Rash  . Pollen Extract Shortness Of Breath    Leaves, pt. reports  . Percodan [Oxycodone-Aspirin]     Pt does not remember  . Statins Other (See Comments)    Muscle aches in legs     Medication list has been reviewed and updated.  Current Outpatient Medications on File Prior to Visit  Medication Sig Dispense Refill  . albuterol (VENTOLIN HFA) 108 (90 Base) MCG/ACT inhaler inhale 2 puffs by mouth every 4 hours as needed for wheezing or shortness of breath 18 g 5  . aspirin 325 MG tablet Take 325 mg by mouth daily.    . betamethasone dipropionate (DIPROLENE) 0.05 % cream   0  . busPIRone (BUSPAR) 7.5 MG tablet Take 1 tablet (7.5 mg total) by mouth 2 (two) times daily. (Patient taking differently: Take 3.75 mg by mouth 2 (two) times  daily. ) 60 tablet 0  . cholecalciferol (VITAMIN D) 1000 UNITS tablet Take 2,000 Units by mouth daily.     [name]. escitalopram (LEXAPRO) 10 MG tablet Take 1 tablet (10 mg total) by mouth daily. 90 tablet 3  . ipratropium-albuterol (DUONEB) 0.5-2.5 (3) MG/3ML SOLN Take 3 mLs by nebulization every 6 (six) hours as needed. 90 mL 3  . Melatonin 3 MG CAPS Take 3 mg by mouth at bedtime.    . mometasone-formoterol (DULERA) 100-5 MCG/ACT AERO Inhale 2 puffs into the lungs 2 (two) times daily.    . Multiple Vitamins-Minerals (ONE-A-DAY WOMENS 50+ ADVANTAGE) TABS Take 1 tablet by mouth daily.    . Tiotropium Bromide Monohydrate (SPIRIVA RESPIMAT) 2.5 MCG/ACT AERS Inhale 2 puffs into the lungs daily. 1 Inhaler 6  . fluticasone furoate-vilanterol (BREO ELLIPTA) 100-25 MCG/INH AEPB Inhale 1 puff into the lungs daily. (Patient not taking: Reported on 12/15/2017) 30 each 0   No current facility-administered medications on file prior to visit.     Review of Systems:  As per HPI- otherwise negative. No fever or chills No acute change in her sx Still feels SOB but not worse than her baseline right  now    Physical Examination: Vitals:   12/15/17 1336  BP: 120/70  Pulse: 81  Resp: 16  SpO2: 96%   Vitals:   12/15/17 1336  Weight: 144 lb (65.3 kg)  Height: 5\' 2"  (1.575 m)   Body mass index is 26.34 kg/m. Ideal Body Weight: Weight in (lb) to have BMI = 25: 136.4  GEN: WDWN, NAD, Non-toxic, A & O x 3, looks well, her normal self HEENT: Atraumatic, Normocephalic. Neck supple. No masses, No LAD. Ears and Nose: No external deformity. CV: RRR, No M/G/R. No JVD. No thrill. No extra heart sounds. PULM: CTA B, no wheezes, crackles, rhonchi. No retractions. No resp. distress. No accessory muscle use.  EXTR: No c/c/e NEURO Normal gait.  PSYCH: Normally interactive. Conversant. Not depressed or anxious appearing.  Calm demeanor.   Vitals above taken while pt on room air. Then did put on her [place] oxygen  Assessment and Plan: Hospital discharge follow-up  Generalized anxiety disorder - Plan: busPIRone (BUSPAR) 15 MG tablet  COPD, group C, by GOLD 2017 classification [facility](HCC)  Essential hypertension  Following up from recent hospital admission today She has COPD but also significant anxiety which may also be contributing Currently using oxygen via [place] which does make her feel better  Called and made her a pulmonology appt on 8/1 Went over her inhalers and her oxygen She feels ok, stable, but will seek care if any changes to her condition   Signed Abbe AmsterdamJessica Wynne Rozak, MD

## 2017-12-15 ENCOUNTER — Ambulatory Visit (INDEPENDENT_AMBULATORY_CARE_PROVIDER_SITE_OTHER): Payer: Medicare Other | Admitting: Family Medicine

## 2017-12-15 ENCOUNTER — Encounter: Payer: Self-pay | Admitting: Family Medicine

## 2017-12-15 VITALS — BP 120/70 | HR 81 | Resp 16 | Ht 62.0 in | Wt 144.0 lb

## 2017-12-15 DIAGNOSIS — F411 Generalized anxiety disorder: Secondary | ICD-10-CM

## 2017-12-15 DIAGNOSIS — Z09 Encounter for follow-up examination after completed treatment for conditions other than malignant neoplasm: Secondary | ICD-10-CM | POA: Diagnosis not present

## 2017-12-15 DIAGNOSIS — J449 Chronic obstructive pulmonary disease, unspecified: Secondary | ICD-10-CM | POA: Diagnosis not present

## 2017-12-15 DIAGNOSIS — I10 Essential (primary) hypertension: Secondary | ICD-10-CM

## 2017-12-15 MED ORDER — BUSPIRONE HCL 15 MG PO TABS: 7.5000 mg | ORAL_TABLET | Freq: Two times a day (BID) | ORAL | 3 refills | Status: DC

## 2017-12-15 NOTE — Patient Instructions (Addendum)
You have an appt with your pulmonoloigst-  Praveen Mannam MD 8/1 at 11:15 am  Address: [address], [place], [place] [id number]  In the meantime continue to use your inhalers and oxygen as needed. Remember no smoking while you are using oxygen!  Fire risk!    Please let me know if you are not doing ok or if you need any help. Take care

## 2017-12-24 NOTE — Progress Notes (Signed)
[facility] [address], [address] [place], [place] [address] [phone] [phone]  Date:  12/26/2017   Name:  Tasha [name]   DOB:  10-23-1950   MRN:  [id number]  PCP:  Pearline Cablesopland, [name], MD    Chief Complaint: Medication Follow Up (buspar not working?)   History of Present Illness:  Tasha [name] is a 67 y.o. very pleasant female patient who presents with the following:  Following up today History of COPD, anxiety, smoking, hyperlipidemia. Recovering alcoholic  I saw her on 7/18 for hospital follow-up.  We had started her on buspar for her anxiety.  We had also tried a low dose BB for anxiety  She is seeing pulmonology later on this week- appt on 8/1 She is on oxygen from her hospital stay- this is a new thing for her - however she is not wearing this right now, she does have it with her just in case she needs it but she is starting to wean off She is using her inhalers and nebs as directed  She is using ventolin prn- maybe 5-6x a day   She is on lexapro 10 mg and reports that her mood is overall ok- she does have a therapist and denies any risk of self harm.  However her anxiety is still not under control. We avoid benzos for her due to history of alcohol abuse We can certainly go up on her buspar and she would like to try this   No fever or chills Breathing is getting better     Patient Active Problem List   Diagnosis Date Noted  . COPD with acute exacerbation (HCC) 12/10/2017  . Acute on chronic respiratory failure with hypoxia (HCC) 12/10/2017  . Generalized anxiety disorder 12/09/2017  . COPD, group C, by GOLD 2017 classification (HCC) 08/24/2016  . Nut allergy 09/03/2015  . Abnormal nuclear stress test 06/10/2015  . Family history of colorectal cancer 05/27/2015  . Tobacco abuse 05/27/2015  . 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) 04/28/2015  . Essential hypertension 04/24/2014  . DVT (deep venous thrombosis) (HCC) 03/10/2012   . 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 Pre-diabetes 02/10/2009  . ENDOMETRIAL POLYP 10/01/2008  . Hypercholesteremia 04/17/2008  . DEPRESSION 04/17/2008  . ASTHMA 04/17/2008  . SCOLIOSIS 04/17/2008  . COLONIC POLYPS, HX OF 04/17/2008    Past Medical History:  Diagnosis Date  . Abnormal nuclear stress test 06/10/2015  . Allergy   . Anxiety   . Arthritis    DDD lumbar; B thumbs  . Asthma    age 67  . Clotting disorder (HCC)   . Depression   . DVT (deep venous thrombosis) (HCC)   . GERD (gastroesophageal reflux disease)   . Hyperlipidemia   . Hypertension   . Psoriasiform dermatitis     Past Surgical History:  Procedure Laterality Date  . NOSE SURGERY    . TONSILLECTOMY    . TUBAL LIGATION      Social History   Tobacco Use  . Smoking status: Current Every Day Smoker    Packs/day: 0.25    Years: 46.00    Pack years: 11.50    Types: Cigarettes  . Smokeless tobacco: Never Used  Substance Use Topics  . Alcohol use: No  . Drug use: No    Family History  Problem Relation Age of Onset  . Cancer Mother [id number]       colorectal cancer  . Stroke Mother 6070  CVA  . Alcohol abuse Mother   . Diabetes Mother   . Hypertension Mother   . Heart disease Father   . Cancer Maternal Grandfather        lung  . Alcohol abuse Brother   . Heart attack Brother   . Cancer Maternal Aunt        breast  . Breast cancer Maternal Aunt 75    Allergies  Allergen Reactions  . Acitretin Swelling and Rash  . Pollen Extract Shortness Of Breath    Leaves, pt. reports  . Percodan [Oxycodone-Aspirin]     Pt does not remember  . Statins Other (See Comments)    Muscle aches in legs     Medication list has been reviewed and updated.  Current Outpatient Medications on File Prior to Visit  Medication Sig Dispense Refill  . albuterol (VENTOLIN HFA) 108 (90 Base) MCG/ACT inhaler inhale 2 puffs by mouth every 4 hours as needed for wheezing or shortness of breath 18 g 5  . aspirin  325 MG tablet Take 325 mg by mouth daily.    . busPIRone (BUSPAR) 15 MG tablet Take 0.5 tablets (7.5 mg total) by mouth 2 (two) times daily. 90 tablet 3  . cholecalciferol (VITAMIN D) 1000 UNITS tablet Take 2,000 Units by mouth daily.     [name] escitalopram (LEXAPRO) 10 MG tablet Take 1 tablet (10 mg total) by mouth daily. 90 tablet 3  . ipratropium-albuterol (DUONEB) 0.5-2.5 (3) MG/3ML SOLN Take 3 mLs by nebulization every 6 (six) hours as needed. 90 mL 3  . Melatonin 3 MG CAPS Take 3 mg by mouth at bedtime.    . mometasone-formoterol (DULERA) 100-5 MCG/ACT AERO Inhale 2 puffs into the lungs 2 (two) times daily.    . Multiple Vitamins-Minerals (ONE-A-DAY WOMENS 50+ ADVANTAGE) TABS Take 1 tablet by mouth daily.    . Tiotropium Bromide Monohydrate (SPIRIVA RESPIMAT) 2.5 MCG/ACT AERS Inhale 2 puffs into the lungs daily. 1 Inhaler 6   No current facility-administered medications on file prior to visit.     Review of Systems:  As per HPI- otherwise negative. No fever or chills No CP  Wheezing is much better    Physical Examination: Vitals:   12/26/17 1535  BP: 112/70  Pulse: 94  Resp: 20  SpO2: 94%   Vitals:   12/26/17 1535  Weight: 144 lb (65.3 kg)  Height: 5\' 2"  (1.575 m)   Body mass index is 26.34 kg/m. Ideal Body Weight: Weight in (lb) to have BMI = 25: 136.4  GEN: WDWN, NAD, Non-toxic, A & O x 3, overweight, looks well  HEENT: Atraumatic, Normocephalic. Neck supple. No masses, No LAD. Ears and Nose: No external deformity. CV: RRR, No M/G/R. No JVD. No thrill. No extra heart sounds. PULM: CTA B, no wheezes, crackles, rhonchi. No retractions. No resp. distress. No accessory muscle use. Lungs sound great today- clear.  She is not wearing oxygen but does have her tank with her  EXTR: No c/c/e NEURO Normal gait.  PSYCH: Normally interactive. Conversant. Not depressed or anxious appearing.  Calm demeanor.    Assessment and Plan: Generalized anxiety disorder - Plan: busPIRone  (BUSPAR) 15 MG tablet  Increase buspar gradually from 7.5 bid to 15 bid  She will let me know how this works for her Pulmonology follow-up and follow-up with me arranged Signed Abbe Amsterdam, MD

## 2017-12-26 ENCOUNTER — Encounter: Payer: Self-pay | Admitting: Family Medicine

## 2017-12-26 ENCOUNTER — Ambulatory Visit (INDEPENDENT_AMBULATORY_CARE_PROVIDER_SITE_OTHER): Payer: Medicare Other | Admitting: Family Medicine

## 2017-12-26 DIAGNOSIS — F411 Generalized anxiety disorder: Secondary | ICD-10-CM | POA: Diagnosis not present

## 2017-12-26 MED ORDER — BUSPIRONE HCL 15 MG PO TABS: 15.0000 mg | ORAL_TABLET | Freq: Two times a day (BID) | ORAL | 3 refills | Status: DC

## 2017-12-26 NOTE — Patient Instructions (Addendum)
Let's increase your buspar to 15 mg twice a day- right now you are taking 7.5 mg twice a day  Increase just your pm dose to 15 at bedtime for one week Then increase to 15 mg twice a day- I will write a new rx for you  Please let me know how this is working for you over the next few weeks and take care!

## 2017-12-29 ENCOUNTER — Telehealth: Payer: Self-pay | Admitting: Pulmonary Disease

## 2017-12-29 ENCOUNTER — Encounter: Payer: Self-pay | Admitting: Pulmonary Disease

## 2017-12-29 ENCOUNTER — Ambulatory Visit (INDEPENDENT_AMBULATORY_CARE_PROVIDER_SITE_OTHER): Payer: Medicare Other | Admitting: Pulmonary Disease

## 2017-12-29 ENCOUNTER — Telehealth: Payer: Self-pay | Admitting: Family Medicine

## 2017-12-29 VITALS — BP 130/76 | HR 81 | Ht 61.0 in | Wt 144.4 lb

## 2017-12-29 DIAGNOSIS — J449 Chronic obstructive pulmonary disease, unspecified: Secondary | ICD-10-CM

## 2017-12-29 DIAGNOSIS — F411 Generalized anxiety disorder: Secondary | ICD-10-CM

## 2017-12-29 MED ORDER — BUSPIRONE HCL 15 MG PO TABS: 15.0000 mg | ORAL_TABLET | Freq: Two times a day (BID) | ORAL | 3 refills | Status: DC

## 2017-12-29 MED ORDER — FLUTICASONE-UMECLIDIN-VILANT 100-62.5-25 MCG/INH IN AEPB: 1.0000 | INHALATION_SPRAY | Freq: Every day | RESPIRATORY_TRACT | 6 refills | Status: DC

## 2017-12-29 MED ORDER — FLUTICASONE-UMECLIDIN-VILANT 100-62.5-25 MCG/INH IN AEPB: 1.0000 | INHALATION_SPRAY | Freq: Every day | RESPIRATORY_TRACT | 0 refills | Status: AC

## 2017-12-29 NOTE — Telephone Encounter (Signed)
Pt returned call. Pt made aware of need for PFT at 80mo f/up visit in November 2019.

## 2017-12-29 NOTE — Telephone Encounter (Signed)
Copied from CRM [id number]. Topic: Quick Communication - See Telephone Encounter >> Dec 29, 2017  3:56 PM Terisa Starraylor, Brittany L wrote: CRM for notification. See Telephone encounter for: 12/29/17.  Patient states that the pharmacy told her they did not receive the script for busPIRone (BUSPAR) 15 MG tablet. Please re-send. It looks like it was sent on our end 7/29  [facility] , [place]

## 2017-12-29 NOTE — Patient Instructions (Addendum)
I am glad you are doing better after her recent hospitalization Continue Spiriva and the Dulera Switch to trelegy after you finish the dulera. You need to stop spiriva while on trelegy Congratulations on quitting smoking.  Recheck with your primary care about your pneumonia vaccine Follow-up in 3 months.

## 2017-12-29 NOTE — Progress Notes (Signed)
Tasha [name]    [id number]    Oct 27, 1950  Primary Care Physician:Copland, Gwenlyn Found, MD  Referring Physician: Pearline Cables, MD [address] [place], [place] [address]  Chief complaint:   Follow up for COPD GOLD C (CAT score 9, 2 exacerbations over the past year)  HPI: 67 year old with past medical history of asthma, hypertension, hypercholesterolemia,, left DVT. She has complains of dyspnea on exertion for the past several months. She does not have dyspnea at rest. She notes chronic productive cough with white mucus production, daily wheezing. She denies any fevers, chills, chest pain, palpitation.  She is an active smoker and smoked 1 pack per day for about 50 years. She quit drinking 30 years ago. 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 She does not note any exposures at work or at home. She was diagnosed with left DVT 4 years ago and treated with Xarelto for 6 months. Repeat ultrasound showed resolution of the DVT and she's been off antibiotic coagulation since then. She is on albuterol rescue inhaler. She is also taking Atrovent nebs and Spiriva on alternate days. She states that these inhalers and nebulizers to help with her breathing. She did not like Breo as it was not effective.     Interim history: She had an hospitalization in July for dyspnea.  Prior to that she was seen twice at urgent care and given prednisone tapers. Treated for COPD exacerbation with doxycycline, steroids, duo nebs. Started on [place] and 2 L supplemental oxygen  Returns to clinic today stating that her breathing is doing well.  She is quit smoking finally last month.  Outpatient Encounter Medications as of 12/29/2017  Medication Sig  . albuterol (VENTOLIN HFA) 108 (90 Base) MCG/ACT inhaler inhale 2 puffs by mouth every 4 hours as needed for wheezing or shortness of breath  . aspirin 325 MG tablet Take 325 mg by mouth daily.  . busPIRone (BUSPAR)  15 MG tablet Take 1 tablet (15 mg total) by mouth 2 (two) times daily.  . cholecalciferol (VITAMIN D) 1000 UNITS tablet Take 2,000 Units by mouth daily.   [name] escitalopram (LEXAPRO) 10 MG tablet Take 1 tablet (10 mg total) by mouth daily.  [name] ipratropium-albuterol (DUONEB) 0.5-2.5 (3) MG/3ML SOLN Take 3 mLs by nebulization every 6 (six) hours as needed.  . Melatonin 3 MG CAPS Take 3 mg by mouth at bedtime.  . mometasone-formoterol (DULERA) 100-5 MCG/ACT AERO Inhale 2 puffs into the lungs 2 (two) times daily.  . Multiple Vitamins-Minerals (ONE-A-DAY WOMENS 50+ ADVANTAGE) TABS Take 1 tablet by mouth daily.  . Tiotropium Bromide Monohydrate (SPIRIVA RESPIMAT) 2.5 MCG/ACT AERS Inhale 2 puffs into the lungs daily.   No facility-administered encounter medications on file as of 12/29/2017.     Allergies as of 12/29/2017 - Review Complete 12/29/2017  Allergen Reaction Noted  . Acitretin Swelling and Rash 09/23/2011  . Pollen extract Shortness Of Breath 12/09/2017  . Percodan [oxycodone-aspirin]  05/20/2015  . Statins Other (See Comments) 04/17/2008    Past Medical History:  Diagnosis Date  . Abnormal nuclear stress test 06/10/2015  . Allergy   . Anxiety   . Arthritis    DDD lumbar; B thumbs  . Asthma    age 34  . Clotting disorder (HCC)   . Depression   . DVT (deep venous thrombosis) (HCC)   . GERD (gastroesophageal  reflux disease)   . Hyperlipidemia   . Hypertension   . Psoriasiform dermatitis     Past Surgical History:  Procedure Laterality Date  . NOSE SURGERY    . TONSILLECTOMY    . TUBAL LIGATION      Family History  Problem Relation Age of Onset  . Cancer Mother [id number]       colorectal cancer  . Stroke Mother 8370       CVA  . Alcohol abuse Mother   . Diabetes Mother   . Hypertension Mother   . Heart disease Father   . Cancer Maternal Grandfather        lung  . Alcohol abuse Brother   . Heart attack Brother   . Cancer Maternal Aunt        breast  . Breast cancer  Maternal Aunt 1875    Social History   Socioeconomic History  . Marital status: Single    Spouse name: Not on file  . Number of children: Not on file  . Years of education: Not on file  . Highest education level: Not on file  Occupational History  . Not on file  Social Needs  . Financial resource strain: Not on file  . Food insecurity:    Worry: Not on file    Inability: Not on file  . Transportation needs:    Medical: Not on file    Non-medical: Not on file  Tobacco Use  . Smoking status: Former Smoker    Packs/day: 0.25    Years: 46.00    Pack years: 11.50    Types: Cigarettes    Last attempt to quit: 12/12/2017    Years since quitting: 0.0  . Smokeless tobacco: Never Used  Substance and Sexual Activity  . Alcohol use: No  . Drug use: No  . Sexual activity: Not on file  Lifestyle  . Physical activity:    Days per week: Not on file    Minutes per session: Not on file  . Stress: Not on file  Relationships  . Social connections:    Talks on phone: Not on file    Gets together: Not on file    Attends religious service: Not on file    Active member of club or organization: Not on file    Attends meetings of clubs or organizations: Not on file    Relationship status: Not on file  . Intimate partner violence:    Fear of current or ex partner: Not on file    Emotionally abused: Not on file    Physically abused: Not on file    Forced sexual activity: Not on file  Other Topics Concern  . Not on file  Social History Narrative   Marital status: single; not dating and not interested      Children: none      Lives: alone with dog      Employment:  Semi-retired; works a little bit; [employer] since 1980; maintains one client      Tobacco: 1 ppd x 50 years      Alcohol: quit; [employer] SINCE 09/25/1985.  Attends AA twice weekly; still sponsors others.      Drugs:  None      Exercise:       ADLs: independent with ADLs; no assistant devices.      Advanced  Directives:  Yes.  desires FULL CODE.   Review of systems: Review of Systems  Constitutional: Negative for fever and  chills.  HENT: Negative.   Eyes: Negative for blurred vision.  Respiratory: as per HPI  Cardiovascular: Negative for chest pain and palpitations.  Gastrointestinal: Negative for vomiting, diarrhea, blood per rectum. Genitourinary: Negative for dysuria, urgency, frequency and hematuria.  Musculoskeletal: Negative for myalgias, back pain and joint pain.  Skin: Negative for itching and rash.  Neurological: Negative for dizziness, tremors, focal weakness, seizures and loss of consciousness.  Endo/Heme/Allergies: Negative for environmental allergies.  Psychiatric/Behavioral: Negative for depression, suicidal ideas and hallucinations.  All other systems reviewed and are negative.  Physical Exam: Blood pressure 130/76, pulse 81, height 5\' 1"  (1.549 m), weight 144 lb 6.4 oz (65.5 kg), SpO2 96 %. Gen:      No acute distress HEENT:  EOMI, sclera anicteric Neck:     No masses; no thyromegaly Lungs:    Clear to auscultation bilaterally; normal respiratory effort CV:         Regular rate and rhythm; no murmurs Abd:      + bowel sounds; soft, non-tender; no palpable masses, no distension Ext:    No edema; adequate peripheral perfusion Skin:      Warm and dry; no rash Neuro: alert and oriented x 3 Psych: normal mood and affect  Data Reviewed: PFTs 04/08/16 FVC 1.80 [69%), FEV1 1.2 [61%), F/F 68 Moderate obstructive defect.  PFTs 05/13/16 FVC 2.20 (80%), FEV1 1.40 (68%), F/F 64, TLC 110%, RV/TLC 134%, DLCO 77% Moderate-severe obstruction with significant reversibility, Air trapping Mild diffusion defect   Screening CT chest 09/10/16-mild emphysema, mild right middle lobe, left upper lobe scarring. CT angio 12/10/17-pulmonary embolism, centrilobular emphysema, bronchial wall thickening, lower lobe mucous plugging.  I have reviewed the images personally.  FENO 05/12/15- 6 IgE  05/11/16- 106  Assessment:  COPD GOLD C Seen in clinic after recent hospitalization for COPD exacerbation Currently on Dulera and Spiriva.  I will switch her to Trelegy inhaler. Reassess with PFTs at return visit.  Active smoker Quit smoking last month Congratulated on quitting  Health maintenance She will check with her primary care regarding status of vaccines.  Plan/Recommendations: - Switch inhalers to Trelegy.   Chilton Greathouse MD [facility]  12/29/2017, 11:07 AM  CC: Copland, Gwenlyn Found, MD

## 2017-12-29 NOTE — Telephone Encounter (Signed)
Rx sent in for patient

## 2017-12-29 NOTE — Addendum Note (Signed)
Addended by: Maxwell MarionBLANKENSHIP, MARGIE A on: 12/29/2017 01:44 PM   Modules accepted: Orders

## 2017-12-29 NOTE — Telephone Encounter (Addendum)
Per Dr. Isaiah SergeMannam verbally- schedule PFT prior to 35mo rov. PFT has been ordered.  lmtcb x1 to make pt aware.

## 2017-12-29 NOTE — Telephone Encounter (Signed)
Will close encounter, as nothing further is needed at this time.  

## 2017-12-29 NOTE — Telephone Encounter (Signed)
Attempted to call patient today regarding to schedule f/u appt, it is a 13mo f/u appt needed. I did not receive an answer at time of call. I have left a voicemail message for pt to return call. X1

## 2017-12-29 NOTE — Telephone Encounter (Signed)
Pt returning call and can be reached @ [phone] wasn't sure if this was a 19mo or 101mo rov her paper said 19mo please advise.Caren GriffinsStanley A Dalton

## 2018-01-06 NOTE — Telephone Encounter (Signed)
Called pt- she is taking 7.5 buspar am and 15 pm. She is NOT in crisis, no SI She will increase her buspar to 15 BID and let me know if not helpful in about 2 weeks

## 2018-01-06 NOTE — Telephone Encounter (Signed)
Patient would like to try a different medication than Buspar because it's not helping with anxiety and might need to be increased.

## 2018-01-06 NOTE — Telephone Encounter (Signed)
Please advise 

## 2018-01-11 DIAGNOSIS — L2089 Other atopic dermatitis: Secondary | ICD-10-CM | POA: Diagnosis not present

## 2018-01-20 ENCOUNTER — Telehealth: Payer: Self-pay | Admitting: Family Medicine

## 2018-01-20 NOTE — Telephone Encounter (Signed)
Great!

## 2018-01-20 NOTE — Telephone Encounter (Signed)
Copied from CRM [id number]. Topic: Quick Communication - Rx Refill/Question >> Jan 20, 2018 12:47 PM Tasha [name], Tasha [name]: Medication: busPIRone (BUSPAR) 15 MG tablet [[id number]][[id number]]   Pt is doing fine on medication attn: pcp

## 2018-01-28 NOTE — Progress Notes (Signed)
[facility] [address] [place], [place] [address] [phone]  Date:  02/01/2018   Name:  Tasha [name]   DOB:  [birth year]   MRN:  [id number]  PCP:  Pearline Cables, MD    Chief Complaint: Anxiety   History of Present Illness:  Tasha [name] is a 67 y.o. very pleasant female patient who presents with the following:  Short term follow-up today Last seen here in late July  History of COPD, anxiety, smoking, hyperlipidemia. Recovering alcoholic  I saw her on 7/18 for hospital follow-up.  We had started her on buspar for her anxiety.  We had also tried a low dose BB for anxiety  She is seeing pulmonology later on this week- appt on 8/1 She is on oxygen from her hospital stay- this is a new thing for her - however she is not wearing this right now, she does have it with her just in case she needs it but she is starting to wean off She is using her inhalers and nebs as directed  She is using ventolin prn- maybe 5-6x a day  She is on lexapro 10 mg and reports that her mood is overall ok- she does have a therapist and denies any risk of self harm.  However her anxiety is still not under control. We avoid benzos for her due to history of alcohol abuse We can certainly go up on her buspar and she would like to try this   Her pulmonoloigst did change her inhaler recently-  She is still using a sample of Breo but then will change over She feels like the buspar is helping her more at the higher dose She is taking 15 BID and tolerating it well generally  Getting to sleep however is still really tough It may take her 3.5 hours to get to sleep She will get hypnic jerks which make it hard for her to get to sleep She has some luck with a topical magnesium oil on her legs- this helps some  She has wondered about using some OTC doxylamine for sleep- I think this would be ok for her to try  She has ventolin to use prn- she may use  this a few times a day   Vivianne notes that she first had asthma on her 28th b-day   She quit smoking 6 weeks ago- great news!  Patient Active Problem List   Diagnosis Date Noted  . COPD with acute exacerbation (HCC) 12/10/2017  . Acute on chronic respiratory failure with hypoxia (HCC) 12/10/2017  . Generalized anxiety disorder 12/09/2017  . COPD, group C, by GOLD 2017 classification (HCC) 08/24/2016  . Nut allergy 09/03/2015  . Abnormal nuclear stress test 06/10/2015  . Family history of colorectal cancer 05/27/2015  . Tobacco abuse 05/27/2015  . 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) 04/28/2015  . Essential hypertension 04/24/2014  . DVT (deep venous thrombosis) (HCC) 03/10/2012  . 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 Pre-diabetes 02/10/2009  . ENDOMETRIAL POLYP 10/01/2008  . Hypercholesteremia 04/17/2008  . DEPRESSION 04/17/2008  . ASTHMA 04/17/2008  . SCOLIOSIS 04/17/2008  . COLONIC POLYPS, HX OF 04/17/2008    Past Medical History:  Diagnosis Date  . Abnormal nuclear stress test 06/10/2015  . Allergy   . Anxiety   . Arthritis    DDD lumbar; B thumbs  . Asthma    age 26  . Clotting disorder (HCC)   . Depression   .  DVT (deep venous thrombosis) (HCC)   . GERD (gastroesophageal reflux disease)   . Hyperlipidemia   . Hypertension   . Psoriasiform dermatitis     Past Surgical History:  Procedure Laterality Date  . NOSE SURGERY    . TONSILLECTOMY    . TUBAL LIGATION      Social History   Tobacco Use  . Smoking status: Former Smoker    Packs/day: 0.25    Years: 46.00    Pack years: 11.50    Types: Cigarettes    Last attempt to quit: 12/12/2017    Years since quitting: 0.1  . Smokeless tobacco: Never Used  Substance Use Topics  . Alcohol use: No  . Drug use: No    Family History  Problem Relation Age of Onset  . Cancer Mother 4772       colorectal cancer  . Stroke Mother [id number]       CVA  . Alcohol abuse Mother   . Diabetes Mother   . Hypertension Mother   .  Heart disease Father   . Cancer Maternal Grandfather        lung  . Alcohol abuse Brother   . Heart attack Brother   . Cancer Maternal Aunt        breast  . Breast cancer Maternal Aunt 75    Allergies  Allergen Reactions  . Acitretin Swelling and Rash  . Pollen Extract Shortness Of Breath    Leaves, pt. reports  . Percodan [Oxycodone-Aspirin]     Pt does not remember  . Statins Other (See Comments)    Muscle aches in legs     Medication list has been reviewed and updated.  Current Outpatient Medications on File Prior to Visit  Medication Sig Dispense Refill  . albuterol (VENTOLIN HFA) 108 (90 Base) MCG/ACT inhaler inhale 2 puffs by mouth every 4 hours as needed for wheezing or shortness of breath 18 g 5  . aspirin 325 MG tablet Take 325 mg by mouth daily.    . busPIRone (BUSPAR) 15 MG tablet Take 1 tablet (15 mg total) by mouth 2 (two) times daily. 60 tablet 3  . cholecalciferol (VITAMIN D) 1000 UNITS tablet Take 2,000 Units by mouth daily.     [name]. escitalopram (LEXAPRO) 10 MG tablet Take 1 tablet (10 mg total) by mouth daily. 90 tablet 3  . Fluticasone-Umeclidin-Vilant (TRELEGY ELLIPTA) 100-62.5-25 MCG/INH AEPB Inhale 1 puff into the lungs daily. 60 each 6  . ipratropium-albuterol (DUONEB) 0.5-2.5 (3) MG/3ML SOLN Take 3 mLs by nebulization every 6 (six) hours as needed. 90 mL 3  . Melatonin 3 MG CAPS Take 3 mg by mouth at bedtime.    . Multiple Vitamins-Minerals (ONE-A-DAY WOMENS 50+ ADVANTAGE) TABS Take 1 tablet by mouth daily.     No current facility-administered medications on file prior to visit.     Review of Systems:  As per HPI- otherwise negative. No fever or chills Breathing is better overall  No chest pain    Physical Examination: Vitals:   02/01/18 1029  BP: 124/82  Pulse: 78  Resp: 20  Temp: 97.9 F (36.6 C)  SpO2: 98%   Vitals:   02/01/18 1029  Weight: 152 lb (68.9 kg)  Height: 5\' 1"  (1.549 m)   Body mass index is 28.72 kg/m. Ideal Body  Weight: Weight in (lb) to have BMI = 25: 132  GEN: WDWN, NAD, Non-toxic, A & O x 3, looks well, mild overweight HEENT: Atraumatic, Normocephalic. Neck supple.  No masses, No LAD. Ears and Nose: No external deformity. CV: RRR, No M/G/R. No JVD. No thrill. No extra heart sounds. PULM: CTA B, no wheezes, crackles, rhonchi. No retractions. No resp. distress. No accessory muscle use. EXTR: No c/c/e NEURO Normal gait.  PSYCH: Normally interactive. Conversant. Not depressed or anxious appearing.  Calm demeanor.  Lungs sound good today    Assessment and Plan: Generalized anxiety disorder  Immunization due - Plan: Pneumococcal conjugate vaccine 13-valent IM, Flu vaccine HIGH DOSE PF (Fluzone High dose)  COPD, group C, by GOLD 2017 classification (HCC)  Myoclonic jerking  Essential hypertension  Anxiety is better controlled with current dose of buspar Overall Cecil feels satisfied with her anxiety level right now- she will let me know if she wishes to increase her Buspar later on COPD is under good control at the moment, she is breathing easily Flu shot and prevnar given today She will try OTC doxylamine for her myoclonic jerks- will let me know how this works for her  BP under good control on no meds currently  She quit smoking!    Signed Abbe Amsterdam, MD

## 2018-02-01 ENCOUNTER — Ambulatory Visit (INDEPENDENT_AMBULATORY_CARE_PROVIDER_SITE_OTHER): Payer: Medicare Other | Admitting: Family Medicine

## 2018-02-01 ENCOUNTER — Encounter: Payer: Self-pay | Admitting: Family Medicine

## 2018-02-01 VITALS — BP 124/82 | HR 78 | Temp 97.9°F | Resp 20 | Ht 61.0 in | Wt 152.0 lb

## 2018-02-01 DIAGNOSIS — I10 Essential (primary) hypertension: Secondary | ICD-10-CM | POA: Diagnosis not present

## 2018-02-01 DIAGNOSIS — J449 Chronic obstructive pulmonary disease, unspecified: Secondary | ICD-10-CM | POA: Diagnosis not present

## 2018-02-01 DIAGNOSIS — G253 Myoclonus: Secondary | ICD-10-CM | POA: Diagnosis not present

## 2018-02-01 DIAGNOSIS — F411 Generalized anxiety disorder: Secondary | ICD-10-CM

## 2018-02-01 DIAGNOSIS — Z23 Encounter for immunization: Secondary | ICD-10-CM | POA: Diagnosis not present

## 2018-02-01 NOTE — Patient Instructions (Signed)
Great to see you today as always!  Take care You got your flu shot and pneumonia booster today- the prevnar 13 pneumonia shot Ok to try doxylamine for sleep if you like- start with a 1/2 pill   Please see me in about 4 months to check on how your are doing- sooner if you need anything If you decide you would like to increase your buspar dose later on let me know

## 2018-02-17 ENCOUNTER — Telehealth: Payer: Self-pay | Admitting: *Deleted

## 2018-02-17 NOTE — Telephone Encounter (Signed)
[facility]; forwarded to provider/SLS 09/20

## 2018-03-20 ENCOUNTER — Other Ambulatory Visit: Payer: Self-pay | Admitting: Family Medicine

## 2018-03-20 DIAGNOSIS — J441 Chronic obstructive pulmonary disease with (acute) exacerbation: Secondary | ICD-10-CM

## 2018-04-07 ENCOUNTER — Ambulatory Visit (INDEPENDENT_AMBULATORY_CARE_PROVIDER_SITE_OTHER): Payer: Medicare Other | Admitting: Pulmonary Disease

## 2018-04-07 DIAGNOSIS — J449 Chronic obstructive pulmonary disease, unspecified: Secondary | ICD-10-CM

## 2018-04-07 LAB — PULMONARY FUNCTION TEST
DL/VA % pred: 93 %
DL/VA: 3.97 ml/min/mmHg/L
DLCO UNC % PRED: 72 %
DLCO unc: 13.65 ml/min/mmHg
FEF 25-75 Post: 1.64 L/sec
FEF 25-75 Pre: 0.41 L/sec
FEF2575-%CHANGE-POST: 302 %
FEF2575-%PRED-POST: 94 %
FEF2575-%Pred-Pre: 23 %
FEV1-%CHANGE-POST: 75 %
FEV1-%PRED-POST: 73 %
FEV1-%PRED-PRE: 41 %
FEV1-POST: 1.42 L
FEV1-Pre: 0.81 L
FEV1FVC-%CHANGE-POST: 29 %
FEV1FVC-%Pred-Pre: 72 %
FEV6-%Change-Post: 37 %
FEV6-%Pred-Post: 81 %
FEV6-%Pred-Pre: 59 %
FEV6-PRE: 1.44 L
FEV6-Post: 1.98 L
FEV6FVC-%Change-Post: 1 %
FEV6FVC-%PRED-PRE: 103 %
FEV6FVC-%Pred-Post: 104 %
FVC-%Change-Post: 35 %
FVC-%Pred-Post: 77 %
FVC-%Pred-Pre: 57 %
FVC-Post: 1.98 L
FVC-Pre: 1.47 L
POST FEV1/FVC RATIO: 72 %
PRE FEV6/FVC RATIO: 98 %
Post FEV6/FVC ratio: 100 %
Pre FEV1/FVC ratio: 55 %
RV % PRED: 158 %
RV: 3.11 L
TLC % pred: 115 %
TLC: 5.15 L

## 2018-04-07 NOTE — Progress Notes (Signed)
PFT completed today.  

## 2018-04-10 ENCOUNTER — Ambulatory Visit (INDEPENDENT_AMBULATORY_CARE_PROVIDER_SITE_OTHER): Payer: Medicare Other | Admitting: Pulmonary Disease

## 2018-04-10 ENCOUNTER — Encounter: Payer: Self-pay | Admitting: Pulmonary Disease

## 2018-04-10 VITALS — BP 126/78 | HR 78 | Ht 61.0 in | Wt 152.4 lb

## 2018-04-10 DIAGNOSIS — J449 Chronic obstructive pulmonary disease, unspecified: Secondary | ICD-10-CM | POA: Diagnosis not present

## 2018-04-10 NOTE — Progress Notes (Signed)
Tasha [name]    [id number]    [birth year]  Primary Care Physician:Copland, Gwenlyn Found, MD  Referring Physician: Pearline Cables, MD [address] [place], [place] [address]  Chief complaint:   Follow up for COPD GOLD C (CAT score 9, 2 exacerbations over the past year)  HPI: 67 year old with past medical history of asthma, hypertension, hypercholesterolemia, left DVT, COPD  She is an active smoker and smoked 1 pack per day for about 50 years. She quit drinking 30 years ago. 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 She does not note any exposures at work or at home. She was diagnosed with left DVT 4 years ago and treated with Xarelto for 6 months. Repeat ultrasound showed resolution of the DVT and she's been off antibiotic coagulation since then  She is had multiple COPD exacerbations this year.  Hospitalization in July 2019.    Interim history: Continues on trelegy inhaler  She quit smoking for a few months but unfortunately picked it up again Complains of chronic dyspnea, cough with white mucus.  Outpatient Encounter Medications as of 04/10/2018  Medication Sig  . albuterol (VENTOLIN HFA) 108 (90 Base) MCG/ACT inhaler inhale 2 puffs by mouth every 4 hours as needed for wheezing or shortness of breath  . aspirin 325 MG tablet Take 325 mg by mouth daily.  . busPIRone (BUSPAR) 15 MG tablet Take 1 tablet (15 mg total) by mouth 2 (two) times daily.  . cholecalciferol (VITAMIN D) 1000 UNITS tablet Take 2,000 Units by mouth daily.   [name] escitalopram (LEXAPRO) 10 MG tablet Take 1 tablet (10 mg total) by mouth daily.  . Fluticasone-Umeclidin-Vilant (TRELEGY ELLIPTA) 100-62.5-25 MCG/INH AEPB Inhale 1 puff into the lungs daily.  . hydrOXYzine (ATARAX/VISTARIL) 25 MG tablet Take 25 mg by mouth 3 (three) times daily as needed.  [name] ipratropium-albuterol (DUONEB) 0.5-2.5 (3) MG/3ML SOLN INHALE VIA NEBULIZER EVERY 6 HOURS AS NEEDED  .  Melatonin 3 MG CAPS Take 3 mg by mouth at bedtime.  . Multiple Vitamins-Minerals (ONE-A-DAY WOMENS 50+ ADVANTAGE) TABS Take 1 tablet by mouth daily.   No facility-administered encounter medications on file as of 04/10/2018.     Physical Exam: Blood pressure 130/76, pulse 81, height 5\' 1"  (1.549 m), weight 144 lb 6.4 oz (65.5 kg), SpO2 96 %. Gen:      No acute distress HEENT:  EOMI, sclera anicteric Neck:     No masses; no thyromegaly Lungs:    Clear to auscultation bilaterally; normal respiratory effort CV:         Regular rate and rhythm; no murmurs Abd:      + bowel sounds; soft, non-tender; no palpable masses, no distension Ext:    No edema; adequate peripheral perfusion Skin:      Warm and dry; no rash Neuro: alert and oriented x 3 Psych: normal mood and affect  Data Reviewed: PFTs 04/08/16 FVC 1.80 [69%), FEV1 1.2 [61%), F/F 68 Moderate obstructive defect.  PFTs 05/13/16 FVC 2.20 (80%), FEV1 1.40 (68%), F/F 64, TLC 110%, RV/TLC 134%, DLCO 77% Moderate-severe obstruction with significant reversibility, Air trapping Mild diffusion defect  Screening CT chest 09/10/16-mild emphysema, mild right middle lobe, left upper lobe scarring. CT angio 12/10/17-pulmonary embolism, centrilobular emphysema, bronchial wall thickening, lower lobe mucous plugging.  I have reviewed the images personally.  Labs CBC 12/10/2017-WBC 9.5, eos 0% FENO 05/12/15-  6 IgE 05/11/16- 106  Assessment:  COPD GOLD C Continues on trelegy inhaler PFTs reviewed with compressive bronchodilator response She may have a component of asthma however she has low IgE and eosinophils.  Active smoker Resume smoking again Encouraged to quit altogether.  Health maintenance /4/19-influenza 02/01/2018-Prevnar 04/29/2009-Pneumovax  Plan/Recommendations: - Continue the trelegy, albuterol as needed - Smoking cessation.  Follow-up in 6 months.  Chilton Greathouse MD [facility]  04/10/2018,  4:42 PM  CC: Copland, Gwenlyn Found, MD

## 2018-04-10 NOTE — Patient Instructions (Signed)
Continue the trelegy, albuterol as needed Please work on smoking cessation Follow-up in 6 months.

## 2018-04-24 ENCOUNTER — Other Ambulatory Visit: Payer: Self-pay

## 2018-04-24 ENCOUNTER — Telehealth: Payer: Self-pay | Admitting: Pulmonary Disease

## 2018-04-24 MED ORDER — ALBUTEROL SULFATE HFA 108 (90 BASE) MCG/ACT IN AERS: INHALATION_SPRAY | RESPIRATORY_TRACT | 5 refills | Status: DC

## 2018-04-24 NOTE — Telephone Encounter (Signed)
Sent in refill and contacted pt.  Nothing further needed.

## 2018-04-24 NOTE — Telephone Encounter (Signed)
Spoke with pt, requesting ventolin refill.  States she just spoke with Misty StanleyLisa who sent in rx.  Chart verifies this, receipt confirmation from pharmacy.  Nothing further needed.

## 2018-04-25 DIAGNOSIS — M9903 Segmental and somatic dysfunction of lumbar region: Secondary | ICD-10-CM | POA: Diagnosis not present

## 2018-04-25 DIAGNOSIS — M47816 Spondylosis without myelopathy or radiculopathy, lumbar region: Secondary | ICD-10-CM | POA: Diagnosis not present

## 2018-04-26 DIAGNOSIS — M47816 Spondylosis without myelopathy or radiculopathy, lumbar region: Secondary | ICD-10-CM | POA: Diagnosis not present

## 2018-04-26 DIAGNOSIS — M9903 Segmental and somatic dysfunction of lumbar region: Secondary | ICD-10-CM | POA: Diagnosis not present

## 2018-05-01 DIAGNOSIS — M47816 Spondylosis without myelopathy or radiculopathy, lumbar region: Secondary | ICD-10-CM | POA: Diagnosis not present

## 2018-05-01 DIAGNOSIS — M9903 Segmental and somatic dysfunction of lumbar region: Secondary | ICD-10-CM | POA: Diagnosis not present

## 2018-05-02 ENCOUNTER — Ambulatory Visit (INDEPENDENT_AMBULATORY_CARE_PROVIDER_SITE_OTHER): Payer: Medicare Other | Admitting: Family Medicine

## 2018-05-02 ENCOUNTER — Ambulatory Visit (INDEPENDENT_AMBULATORY_CARE_PROVIDER_SITE_OTHER): Payer: Medicare Other

## 2018-05-02 ENCOUNTER — Encounter: Payer: Self-pay | Admitting: Family Medicine

## 2018-05-02 ENCOUNTER — Telehealth: Payer: Self-pay

## 2018-05-02 VITALS — BP 122/74 | HR 94 | Temp 97.6°F | Resp 16 | Ht 61.0 in | Wt 143.2 lb

## 2018-05-02 DIAGNOSIS — R109 Unspecified abdominal pain: Secondary | ICD-10-CM

## 2018-05-02 DIAGNOSIS — M545 Low back pain, unspecified: Secondary | ICD-10-CM

## 2018-05-02 DIAGNOSIS — M47816 Spondylosis without myelopathy or radiculopathy, lumbar region: Secondary | ICD-10-CM | POA: Diagnosis not present

## 2018-05-02 DIAGNOSIS — M9903 Segmental and somatic dysfunction of lumbar region: Secondary | ICD-10-CM | POA: Diagnosis not present

## 2018-05-02 LAB — CBC WITH DIFFERENTIAL/PLATELET
BASOS ABS: 0.1 10*3/uL (ref 0.0–0.1)
BASOS PCT: 0.3 % (ref 0.0–3.0)
Eosinophils Absolute: 0 10*3/uL (ref 0.0–0.7)
Eosinophils Relative: 0.1 % (ref 0.0–5.0)
HCT: 41.8 % (ref 36.0–46.0)
Hemoglobin: 13.8 g/dL (ref 12.0–15.0)
Lymphocytes Relative: 5.7 % — ABNORMAL LOW (ref 12.0–46.0)
Lymphs Abs: 1 10*3/uL (ref 0.7–4.0)
MCHC: 33 g/dL (ref 30.0–36.0)
MCV: 90.9 fl (ref 78.0–100.0)
MONO ABS: 1.5 10*3/uL — AB (ref 0.1–1.0)
Monocytes Relative: 8.2 % (ref 3.0–12.0)
NEUTROS ABS: 15.2 10*3/uL — AB (ref 1.4–7.7)
NEUTROS PCT: 85.7 % — AB (ref 43.0–77.0)
PLATELETS: 445 10*3/uL — AB (ref 150.0–400.0)
RBC: 4.6 Mil/uL (ref 3.87–5.11)
RDW: 13.4 % (ref 11.5–15.5)
WBC: 17.7 10*3/uL — AB (ref 4.0–10.5)

## 2018-05-02 LAB — BASIC METABOLIC PANEL
BUN: 23 mg/dL (ref 6–23)
CHLORIDE: 101 meq/L (ref 96–112)
CO2: 29 mEq/L (ref 19–32)
Calcium: 9.7 mg/dL (ref 8.4–10.5)
Creatinine, Ser: 0.78 mg/dL (ref 0.40–1.20)
GFR: 77.69 mL/min (ref >60.00)
Glucose, Bld: 119 mg/dL — ABNORMAL HIGH (ref 70–99)
Potassium: 5 mEq/L (ref 3.5–5.1)
SODIUM: 139 meq/L (ref 135–145)

## 2018-05-02 LAB — POC URINALSYSI DIPSTICK (AUTOMATED)
Bilirubin, UA: NEGATIVE
Glucose, UA: NEGATIVE
NITRITE UA: NEGATIVE
PH UA: 6 (ref 5.0–8.0)
PROTEIN UA: POSITIVE — AB
Spec Grav, UA: 1.025 (ref 1.010–1.025)
UROBILINOGEN UA: 0.2 U/dL

## 2018-05-02 LAB — LIPASE: LIPASE: 7 U/L — AB (ref 11.0–59.0)

## 2018-05-02 MED ORDER — KETOROLAC TROMETHAMINE 60 MG/2ML IM SOLN
60.0000 mg | Freq: Once | INTRAMUSCULAR | Status: AC
  Administered 2018-05-02: 60 mg via INTRAMUSCULAR

## 2018-05-02 MED ORDER — TRAMADOL HCL 50 MG PO TABS: 50.0000 mg | ORAL_TABLET | Freq: Three times a day (TID) | ORAL | 0 refills | Status: DC | PRN

## 2018-05-02 NOTE — Telephone Encounter (Signed)
Addendum due to connectivity issue: Chartered loss adjusterAuthor spoke to pt. who was in visible pain, and pt. was agreeable to seeing another provider at another practice. Appointment made with [facility] with Dr. Betty SwazilandJordan for [phone] for pain relief. Pt. was made aware that provider would not be able to do back injection, but may be able to offer alternative pain relief, and pt. was agreeable to plan. Pt. was instructed to follow up with our practice and Dr. Patsy Lageropland on pain management, pt. verbalized understanding.

## 2018-05-02 NOTE — Progress Notes (Signed)
Acute Office Visit  Subjective:    Patient ID: Tasha [name], female    DOB: August 28, 1950, 67 y.o.   MRN: [id number]  Chief Complaint  Patient presents with  . Follow-up    follow-up on back pain that started 1 week go    Tasha [name] is here today c/o 8 days of bilateral 10/10, lower back pain,geting worse.  According to patient, she follows with chiropractor, who recommended seeing PCP for a "cortisone shot." She states that she has had lower back imaging done at her chiropractor's office, OA/degenerative changes seen, no suspicious lesion.  Pain is not radiated to her lower extremities, no numbness or tingling, no saddle anesthesia, and denies bowel/urine incontinence.movement, mildly alleviated by rest.  Exacerbated by  Denies recent trauma. Negative for fever,chills,unusual fatigue,skin rash or local edema/erythema. She has taken Tylenol, which does not seem to help.  She reports Hx of scoliosis. Hx of lumbar DDD and OA.  Diffuse abdominal pain, which she attributes to the back pain. Constant, not radiated. She has not identified exacerbating or alleviating factors.  She denies heartburn, nausea, vomiting, changes in bowel habits, or urinary symptoms. No history of nephrolithiasis.    Back Pain  This is a recurrent problem. The current episode started 1 to 4 weeks ago. The problem occurs constantly. The problem is unchanged. The pain is present in the lumbar spine. The pain is at a severity of 10/10. The pain is severe. The pain is the same all the time. The symptoms are aggravated by position, lying down, standing and sitting. Stiffness is present all day. Associated symptoms include abdominal pain. Pertinent negatives include no bladder incontinence, bowel incontinence, chest pain, dysuria, fever, headaches, leg pain, numbness, paresis, paresthesias, pelvic pain, perianal numbness, tingling, weakness or weight loss. She has tried ice, chiropractic manipulation and  analgesics for the symptoms. The treatment provided no relief.    Past Medical History:  Diagnosis Date  . Abnormal nuclear stress test 06/10/2015  . Allergy   . Anxiety   . Arthritis    DDD lumbar; B thumbs  . Asthma    age 16  . Clotting disorder (HCC)   . Depression   . DVT (deep venous thrombosis) (HCC)   . GERD (gastroesophageal reflux disease)   . Hyperlipidemia   . Hypertension   . Psoriasiform dermatitis     Past Surgical History:  Procedure Laterality Date  . NOSE SURGERY    . TONSILLECTOMY    . TUBAL LIGATION      Family History  Problem Relation Age of Onset  . Cancer Mother 47       colorectal cancer  . Stroke Mother 67       CVA  . Alcohol abuse Mother   . Diabetes Mother   . Hypertension Mother   . Heart disease Father   . Cancer Maternal Grandfather        lung  . Alcohol abuse Brother   . Heart attack Brother   . Cancer Maternal Aunt        breast  . Breast cancer Maternal Aunt 79    Social History   Socioeconomic History  . Marital status: Single    Spouse name: Not on file  . Number of children: Not on file  . Years of education: Not on file  . Highest education level: Not on file  Occupational History  . Not on file  Social Needs  . Financial resource strain: Not on file  .  Food insecurity:    Worry: Not on file    Inability: Not on file  . Transportation needs:    Medical: Not on file    Non-medical: Not on file  Tobacco Use  . Smoking status: Current Every Day Smoker    Packs/day: 0.25    Years: 46.00    Pack years: 11.50    Types: Cigarettes  . Smokeless tobacco: Never Used  Substance and Sexual Activity  . Alcohol use: No  . Drug use: No  . Sexual activity: Not on file  Lifestyle  . Physical activity:    Days per week: Not on file    Minutes per session: Not on file  . Stress: Not on file  Relationships  . Social connections:    Talks on phone: Not on file    Gets together: Not on file    Attends religious  service: Not on file    Active member of club or organization: Not on file    Attends meetings of clubs or organizations: Not on file    Relationship status: Not on file  . Intimate partner violence:    Fear of current or ex partner: Not on file    Emotionally abused: Not on file    Physically abused: Not on file    Forced sexual activity: Not on file  Other Topics Concern  . Not on file  Social History Narrative   Marital status: single; not dating and not interested      Children: none      Lives: alone with dog      Employment:  Semi-retired; works a little bit; [employer] since 1980; maintains one client      Tobacco: 1 ppd x 50 years      Alcohol: quit; [place] SINCE 09/25/1985.  Attends AA twice weekly; still sponsors others.      Drugs:  None      Exercise:       ADLs: independent with ADLs; no assistant devices.      Advanced Directives:  Yes.  desires FULL CODE.    Outpatient Medications Prior to Visit  Medication Sig Dispense Refill  . albuterol (VENTOLIN HFA) 108 (90 Base) MCG/ACT inhaler inhale 2 puffs by mouth every 4 hours as needed for wheezing or shortness of breath 18 g 5  . aspirin 325 MG tablet Take 325 mg by mouth daily.    . busPIRone (BUSPAR) 15 MG tablet Take 1 tablet (15 mg total) by mouth 2 (two) times daily. 60 tablet 3  . cholecalciferol (VITAMIN D) 1000 UNITS tablet Take 2,000 Units by mouth daily.     [name]. escitalopram (LEXAPRO) 10 MG tablet Take 1 tablet (10 mg total) by mouth daily. 90 tablet 3  . Fluticasone-Umeclidin-Vilant (TRELEGY ELLIPTA) 100-62.5-25 MCG/INH AEPB Inhale 1 puff into the lungs daily. 60 each 6  . hydrOXYzine (ATARAX/VISTARIL) 25 MG tablet Take 25 mg by mouth 3 (three) times daily as needed.    [name]. ipratropium-albuterol (DUONEB) 0.5-2.5 (3) MG/3ML SOLN INHALE 3MLS VIA NEBULIZER EVERY 6 HOURS AS NEEDED 90 mL 2  . Melatonin 3 MG CAPS Take 3 mg by mouth at bedtime.    . Multiple Vitamins-Minerals (ONE-A-DAY WOMENS 50+ ADVANTAGE)  TABS Take 1 tablet by mouth daily.     No facility-administered medications prior to visit.     Allergies  Allergen Reactions  . Acitretin Swelling and Rash  . Pollen Extract Shortness Of Breath    Leaves, pt. reports  .  Percodan [Oxycodone-Aspirin]     Pt does not remember  . Statins Other (See Comments)    Muscle aches in legs     Review of Systems  Constitutional: Negative for chills, fever, malaise/fatigue and weight loss.  Respiratory: Negative for cough, shortness of breath and wheezing.   Cardiovascular: Negative for chest pain, palpitations and leg swelling.  Gastrointestinal: Positive for abdominal pain. Negative for blood in stool, bowel incontinence, constipation, diarrhea, heartburn, melena, nausea and vomiting.  Genitourinary: Negative for bladder incontinence, dysuria, hematuria and pelvic pain.  Musculoskeletal: Positive for back pain. Negative for falls.  Skin: Negative for rash.  Neurological: Negative for tingling, focal weakness, weakness, numbness, headaches and paresthesias.  Psychiatric/Behavioral: The patient is nervous/anxious.        Objective:     Today's Vitals   05/02/18 1126  BP: 122/74  Pulse: 94  Resp: 16  Temp: 97.6 F (36.4 C)  SpO2: 96%  Weight: 143 lb 4 oz (65 kg)  Height: 5\' 1"  (1.549 m)  PainSc: 10-Worst pain ever  PainLoc: Back   Body mass index is 27.07 kg/m.  Physical Exam  Constitutional: She is oriented to person, place, and time. She appears well-developed and well-nourished. She appears distressed.  HENT:  Head: Normocephalic and atraumatic.  Mouth/Throat: Oropharynx is clear and moist and mucous membranes are normal. No oral lesions.  Eyes: Conjunctivae are normal.  Neck: Neck supple.  Cardiovascular: Normal rate and regular rhythm.  Pulses:      Dorsalis pedis pulses are 2+ on the right side, and 2+ on the left side.  Pulmonary/Chest: Effort normal and breath sounds normal. She has no wheezes. She has no rhonchi.  She has no rales. She exhibits no tenderness.  Abdominal: Soft. Normal appearance and bowel sounds are normal. She exhibits no abdominal bruit and no mass. There is no hepatomegaly. There is generalized tenderness. There is no rebound.    Musculoskeletal: She exhibits no edema.       Lumbar back: She exhibits tenderness. She exhibits no bony tenderness, no swelling and no edema.       Back:  Antalgic gait. In the room she was frequently moving, complaining of pain frequently during visit, and bending/ leaning against the examination table.   Lymphadenopathy:    She has no cervical adenopathy.  Neurological: She is alert and oriented to person, place, and time. She has normal strength. No sensory deficit. Gait abnormal.  Reflex Scores:      Patellar reflexes are 2+ on the right side and 2+ on the left side. SLR negative bilateral.   Skin: Skin is warm. No lesion and no rash noted.  Psychiatric: Her mood appears anxious.  Fairly groomed,good eye contact.  Nursing note and vitals reviewed.      Assessment & Plan:   Problem List Items Addressed This Visit      Ms. Caitlan was seen today for follow-up.  Diagnoses and all orders for this visit:  Lab Results  Component Value Date   WBC 17.7 (H) 05/02/2018   HGB 13.8 05/02/2018   HCT 41.8 05/02/2018   MCV 90.9 05/02/2018   PLT 445.0 (H) 05/02/2018   Lab Results  Component Value Date   CREATININE 0.78 05/02/2018   BUN 23 05/02/2018   NA 139 05/02/2018   K 5.0 05/02/2018   CL 101 05/02/2018   CO2 29 05/02/2018   Lab Results  Component Value Date   LIPASE 7.0 (L) 05/02/2018     Abdominal pain, unspecified  abdominal location  Possible etiologies discussed. She is not concerned about this problem,thinks it is related to back pain.? Radicular pain. Because finding on examination I recommend imaging and lab work. Clearly instructed about warning signs. F/U with PCP in 2 days.  -     CBC with Differential/Platelet -      Basic metabolic panel -     POCT Urinalysis Dipstick (Automated) -     Lipase -     DG Abd 2 Views; Future -     ketorolac (TORADOL) injection 60 mg -     DG Abd 2 Views  Bilateral low back pain, unspecified chronicity, unspecified whether sciatica present  Most likely related to DDD. I do not think she would benefit from IM steroid. After discussion of some side effects, reviewing renal function, and BP she agrees with Toradol 60 mg daily. 20 minutes after Toradol back pain was greatly improved, still complaining of abdominal pain but in general she looks more comfortable.  No antalgic gait.  She was clearly instructed about warning signs. She will follow-up with PCP in 2 to 3 days, before if needed.  -     ketorolac (TORADOL) injection 60 mg     [name], MD

## 2018-05-02 NOTE — Patient Instructions (Addendum)
  [name] I have seen you today for an acute visit.  A few things to remember from today's visit:   Abdominal pain, unspecified abdominal location - Plan: CBC with Differential/Platelet, Basic metabolic panel, POCT Urinalysis Dipstick (Automated), Lipase, DG Abd 2 Views, ketorolac (TORADOL) injection 60 mg  Bilateral low back pain, unspecified chronicity, unspecified whether sciatica present - Plan: ketorolac (TORADOL) injection 60 mg   If medications prescribed today, they will not be refill upon request, a follow up appointment with PCP will be necessary to discuss continuation of of treatment if appropriate.  Follow a bland diet for the next few days, small meals, adequate hydration.   GET HELP RIGHT AWAY IF:   The pain is does not go away within 2 hours.  Sudden severe/worsening pain.  You keep throwing up (vomiting).  The pain changes and is only in the right or left part of the belly.  Not being able to pass gas or poop.  You have bloody or tarry looking poop.   MAKE SURE YOU:   Understand these instructions.  Will watch your condition.  Will get help right away if you are not doing well or get worse.   If symptoms are persistent please arrange a follow up appointment.     In general please monitor for signs of worsening symptoms and seek immediate medical attention if any concerning.   I hope you get better soon!

## 2018-05-02 NOTE — Telephone Encounter (Signed)
Pt. Came into clinic after seeing chiropracter, requesting cortisone injection for lumbar spine pain relief. Pt. states she has received them in the past from Dr. Patsy Lageropland, but PCP not available, and no openings in schedule to be seen by provide.

## 2018-05-03 DIAGNOSIS — M47816 Spondylosis without myelopathy or radiculopathy, lumbar region: Secondary | ICD-10-CM | POA: Diagnosis not present

## 2018-05-03 DIAGNOSIS — M9903 Segmental and somatic dysfunction of lumbar region: Secondary | ICD-10-CM | POA: Diagnosis not present

## 2018-05-04 ENCOUNTER — Telehealth: Payer: Self-pay

## 2018-05-04 ENCOUNTER — Encounter: Payer: Self-pay | Admitting: Family Medicine

## 2018-05-04 ENCOUNTER — Ambulatory Visit: Payer: Medicare Other | Admitting: Family Medicine

## 2018-05-04 ENCOUNTER — Ambulatory Visit (INDEPENDENT_AMBULATORY_CARE_PROVIDER_SITE_OTHER): Payer: Medicare Other | Admitting: Family Medicine

## 2018-05-04 ENCOUNTER — Ambulatory Visit (HOSPITAL_BASED_OUTPATIENT_CLINIC_OR_DEPARTMENT_OTHER)
Admission: RE | Admit: 2018-05-04 | Discharge: 2018-05-04 | Disposition: A | Discharge status: Home / Self Care | Payer: Medicare Other | Source: Ambulatory Visit | Attending: Family Medicine | Admitting: Family Medicine

## 2018-05-04 VITALS — BP 120/67 | HR 91 | Temp 98.7°F | Resp 16 | Ht 61.0 in | Wt 143.0 lb

## 2018-05-04 DIAGNOSIS — M545 Low back pain, unspecified: Secondary | ICD-10-CM

## 2018-05-04 NOTE — Patient Instructions (Signed)
Let's get x-rays of your back today on the ground floor.  Then you can go home You might try some of the OTC lidocaine patches that you can get OTC- Salanpas- to use as needed

## 2018-05-04 NOTE — Telephone Encounter (Signed)
Pt. calling requesting to be seen today. Per Dr. Patsy Lageropland, OK to add on for 5PM slot. Author phoned pt. to notify, left detailed VM.

## 2018-05-04 NOTE — Progress Notes (Addendum)
[facility] [address] [place], [place] [address] [phone]  Date:  05/04/2018   Name:  Tasha [name]   DOB:  [birth year]   MRN:  [id number]  PCP:  Tasha Cables, MD    Chief Complaint: No chief complaint on file.   History of Present Illness:  Tasha [name] is a 67 y.o. very pleasant female patient who presents with the following:  History of COPD, HTN,  Here today with low back pain for about 10 days.  No acute injury noted She went to her chiropractor who"told me to see my primary" She brings in a photocopied picture of her x-rays from chiropractic office today.  They do appear to show significant degenerative change of lumbar spine and scoliosis. On Tuesday this week she was seen by Tasha [name] who gave her a shot of toradol  This shot did help for a while She still has pain in her lower back however  She has known history of scoliosis  The pain in her back is staying in her back, no radiation to her legs No numbness or weakness of her legs  They also gave her some tramadol but this make her feel confused so she stopped taking it  She gives me the rest of this bottle today so that I may dispose of it. Patient Active Problem List   Diagnosis Date Noted  . COPD with acute exacerbation (HCC) 12/10/2017  . Acute on chronic respiratory failure with hypoxia (HCC) 12/10/2017  . Generalized anxiety disorder 12/09/2017  . COPD, group C, by GOLD 2017 classification (HCC) 08/24/2016  . Nut allergy 09/03/2015  . Abnormal nuclear stress test 06/10/2015  . Family history of colorectal cancer 05/27/2015  . Tobacco abuse 05/27/2015  . 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) 04/28/2015  . Essential hypertension 04/24/2014  . DVT (deep venous thrombosis) (HCC) 03/10/2012  . 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 Pre-diabetes 02/10/2009  . ENDOMETRIAL POLYP 10/01/2008  . Hypercholesteremia 04/17/2008  . DEPRESSION  04/17/2008  . ASTHMA 04/17/2008  . SCOLIOSIS 04/17/2008  . COLONIC POLYPS, HX OF 04/17/2008    Past Medical History:  Diagnosis Date  . Abnormal nuclear stress test 06/10/2015  . Allergy   . Anxiety   . Arthritis    DDD lumbar; B thumbs  . Asthma    age 24  . Clotting disorder (HCC)   . Depression   . DVT (deep venous thrombosis) (HCC)   . GERD (gastroesophageal reflux disease)   . Hyperlipidemia   . Hypertension   . Psoriasiform dermatitis     Past Surgical History:  Procedure Laterality Date  . NOSE SURGERY    . TONSILLECTOMY    . TUBAL LIGATION      Social History   Tobacco Use  . Smoking status: Current Every Day Smoker    Packs/day: 0.25    Years: 46.00    Pack years: 11.50    Types: Cigarettes  . Smokeless tobacco: Never Used  Substance Use Topics  . Alcohol use: No  . Drug use: No    Family History  Problem Relation Age of Onset  . Cancer Mother 49       colorectal cancer  . Stroke Mother 38       CVA  . Alcohol abuse Mother   . Diabetes Mother   . Hypertension Mother   . Heart disease Father   . Cancer Maternal Grandfather  lung  . Alcohol abuse Brother   . Heart attack Brother   . Cancer Maternal Aunt        breast  . Breast cancer Maternal Aunt 75    Allergies  Allergen Reactions  . Acitretin Swelling and Rash  . Pollen Extract Shortness Of Breath    Leaves, pt. reports  . Percodan [Oxycodone-Aspirin]     Pt does not remember  . Statins Other (See Comments)    Muscle aches in legs     Medication list has been reviewed and updated.  Current Outpatient Medications on File Prior to Visit  Medication Sig Dispense Refill  . albuterol (VENTOLIN HFA) 108 (90 Base) MCG/ACT inhaler inhale 2 puffs by mouth every 4 hours as needed for wheezing or shortness of breath 18 g 5  . aspirin 325 MG tablet Take 325 mg by mouth daily.    . busPIRone (BUSPAR) 15 MG tablet Take 1 tablet (15 mg total) by mouth 2 (two) times daily. 60 tablet 3   . cholecalciferol (VITAMIN D) 1000 UNITS tablet Take 2,000 Units by mouth daily.     [name] escitalopram (LEXAPRO) 10 MG tablet Take 1 tablet (10 mg total) by mouth daily. 90 tablet 3  . Fluticasone-Umeclidin-Vilant (TRELEGY ELLIPTA) 100-62.5-25 MCG/INH AEPB Inhale 1 puff into the lungs daily. 60 each 6  . hydrOXYzine (ATARAX/VISTARIL) 25 MG tablet Take 25 mg by mouth 3 (three) times daily as needed.    [name] ipratropium-albuterol (DUONEB) 0.5-2.5 (3) MG/3ML SOLN INHALE VIA NEBULIZER EVERY 6 HOURS AS NEEDED 90 mL 2  . Melatonin 3 MG CAPS Take 3 mg by mouth at bedtime.    . Multiple Vitamins-Minerals (ONE-A-DAY WOMENS 50+ ADVANTAGE) TABS Take 1 tablet by mouth daily.     No current facility-administered medications on file prior to visit.     Review of Systems:  As per HPI- otherwise negative.   Physical Examination: Vitals:   05/04/18 1653  BP: 120/67  Pulse: 91  Resp: 16  Temp: 98.7 F (37.1 C)  SpO2: 94%   Vitals:   05/04/18 1653  Weight: 143 lb (64.9 kg)  Height: 5\' 1"  (1.549 m)   Body mass index is 27.02 kg/m. Ideal Body Weight: Weight in (lb) to have BMI = 25: 132  GEN: WDWN, NAD, Non-toxic, A & O x 3, looks her normal self. Tasha [name] always tends to use some pursed lip breathing to deal with her chronic COPD  HEENT: Atraumatic, Normocephalic. Neck supple. No masses, No LAD. Ears and Nose: No external deformity. CV: RRR, No M/G/R. No JVD. No thrill. No extra heart sounds. PULM: CTA B, no wheezes, crackles, rhonchi. No retractions. No resp. distress. No accessory muscle use. ABD: S, NT, ND, +BS. No rebound. No HSM.  Belly is benign today EXTR: No c/c/e NEURO Normal gait.  PSYCH: Normally interactive. Conversant. Not depressed or anxious appearing.  Calm demeanor.  Back exam: She indicates the bilateral thoracolumbar junction and lumbar area as the site of her pain.  She has restricted flexion due to pain.  Normal bilateral lower extremity strength, sensation, and DTR, and  normal straight leg raise.  Assessment and Plan: Lumbar spine pain - Plan: DG Lumbar Spine Complete  Here today with lower back pain.  Tasha [name] notes this is been a problem for her for years, but is more painful now than she has experienced in the past.  States that she is otherwise feeling well and that her breathing is normal.  Will obtain a  spine series for her, anticipation of possible MRI.  For the time being she will use over-the-counter pain medications as needed, Marylu LundJanet is in recovery and prefers not to use any addictive medications.  I disposed of her bottle of tramadol, with my CMA Windell MouldingRuth observing me.  Signed Abbe AmsterdamJessica Kemiya Batdorf, MD   Received her spine films 12/6, called her and LMOM.  Will refer her to see a spine doc for significant back disease.  Will refer to [facility].  Asked her to let me know if any questions or concerns  Dg Lumbar Spine Complete  Result Date: 05/05/2018 CLINICAL DATA:  Midline low back pain for the past 10 days with no known injury. No radicular symptoms. History of scoliosis. EXAM: LUMBAR SPINE - COMPLETE 4+ VIEW COMPARISON:  None. FINDINGS: There is moderate curvature convex toward the right centered at L3-4. The pedicles and transverse processes are grossly normal where visualized. There is moderate disc space narrowing at L1-2 and L3-4 with slightly milder narrowing at L2-3 and L4-5 and L5-S1. There is no spondylolisthesis. There is facet joint hypertrophy at L4-5 and at L5-S1. Mild subluxation of L5 to the left as compared L4 is present. IMPRESSION: Multilevel degenerative disc and facet joint changes. No compression fracture. Mild subluxation to the left of L5 with respect to L4. Electronically Signed   By: David  SwazilandJordan M.D.   On: 05/05/2018 09:29   Dg Abd 2 Views  Result Date: 05/03/2018 CLINICAL DATA:  Eight day history of abdominal pain. EXAM: ABDOMEN - 2 VIEW COMPARISON:  None. FINDINGS: The lung bases are clear. The heart is normal in size. There are a few  scattered air-filled small bowel loops with a few scattered air-fluid levels but no significant small bowel distention. There is scattered air and stool throughout the colon and down into the rectum. The soft tissue shadows of the abdomen are maintained. No worrisome calcifications. The bony structures are intact. Thoracolumbar scoliosis noted. IMPRESSION: No plain film findings for an acute abdominal process. Electronically Signed   By: Rudie MeyerP.  Gallerani M.D.   On: 05/03/2018 08:12

## 2018-05-05 NOTE — Addendum Note (Signed)
Addended by: Abbe AmsterdamOPLAND,  C on: 05/05/2018 01:11 PM   Modules accepted: Orders

## 2018-05-08 DIAGNOSIS — M9903 Segmental and somatic dysfunction of lumbar region: Secondary | ICD-10-CM | POA: Diagnosis not present

## 2018-05-08 DIAGNOSIS — M47816 Spondylosis without myelopathy or radiculopathy, lumbar region: Secondary | ICD-10-CM | POA: Diagnosis not present

## 2018-05-09 DIAGNOSIS — M47816 Spondylosis without myelopathy or radiculopathy, lumbar region: Secondary | ICD-10-CM | POA: Diagnosis not present

## 2018-05-09 DIAGNOSIS — M9903 Segmental and somatic dysfunction of lumbar region: Secondary | ICD-10-CM | POA: Diagnosis not present

## 2018-05-10 DIAGNOSIS — M9903 Segmental and somatic dysfunction of lumbar region: Secondary | ICD-10-CM | POA: Diagnosis not present

## 2018-05-10 DIAGNOSIS — M47816 Spondylosis without myelopathy or radiculopathy, lumbar region: Secondary | ICD-10-CM | POA: Diagnosis not present

## 2018-05-15 DIAGNOSIS — M47816 Spondylosis without myelopathy or radiculopathy, lumbar region: Secondary | ICD-10-CM | POA: Diagnosis not present

## 2018-05-15 DIAGNOSIS — M9903 Segmental and somatic dysfunction of lumbar region: Secondary | ICD-10-CM | POA: Diagnosis not present

## 2018-05-18 DIAGNOSIS — M9903 Segmental and somatic dysfunction of lumbar region: Secondary | ICD-10-CM | POA: Diagnosis not present

## 2018-05-18 DIAGNOSIS — M47816 Spondylosis without myelopathy or radiculopathy, lumbar region: Secondary | ICD-10-CM | POA: Diagnosis not present

## 2018-05-22 DIAGNOSIS — M9903 Segmental and somatic dysfunction of lumbar region: Secondary | ICD-10-CM | POA: Diagnosis not present

## 2018-05-22 DIAGNOSIS — M47816 Spondylosis without myelopathy or radiculopathy, lumbar region: Secondary | ICD-10-CM | POA: Diagnosis not present

## 2018-05-27 NOTE — Progress Notes (Signed)
[facility] [address], [address] [place], [place] [address] [phone] [phone]  Date:  05/29/2018   Name:  Tasha [name]   DOB:  04-02-1951   MRN:  [id number]  PCP:  Tasha [name], Tasha Brotherton C, MD    Chief Complaint: COPD (4 month follow up) and Rash (redness, worse at night, couple weeks)   History of Present Illness:  Tasha [name] is a 67 y.o. very pleasant female patient who presents with the following:  Periodic follow-up visit today. History of COPD with current smoking, anxiety, alcoholism in recovery, hypertension, prediabetes, hyperlipidemia, and psoriasiform dermatitis Long-term smoker, continues to smoke some Immunizations are up-to-date CT of lungs done in July  I actually saw her about a month ago, with lower back pain We obtained lumbar films as below, and I referred her to orthopedics  She notes that her back is doing better. She is seeing [place] orth[facility] later this week  Dg Lumbar Spine Complete  Result Date: 05/05/2018 CLINICAL DATA:  Midline low back pain for the past 10 days with no known injury. No radicular symptoms. History of scoliosis. EXAM: LUMBAR SPINE - COMPLETE 4+ VIEW COMPARISON:  None. FINDINGS: There is moderate curvature convex toward the right centered at L3-4. The pedicles and transverse processes are grossly normal where visualized. There is moderate disc space narrowing at L1-2 and L3-4 with slightly milder narrowing at L2-3 and L4-5 and L5-S1. There is no spondylolisthesis. There is facet joint hypertrophy at L4-5 and at L5-S1. Mild subluxation of L5 to the left as compared L4 is present. IMPRESSION: Multilevel degenerative disc and facet joint changes. No compression fracture. Mild subluxation to the left of L5 with respect to L4. Electronically Signed   By: David  SwazilandJordan M.D.   On: 05/05/2018 09:29   Dg Abd 2 Views  Result Date: 05/03/2018 CLINICAL DATA:  Eight day history of abdominal pain. EXAM: ABDOMEN -  2 VIEW COMPARISON:  None. FINDINGS: The lung bases are clear. The heart is normal in size. There are a few scattered air-filled small bowel loops with a few scattered air-fluid levels but no significant small bowel distention. There is scattered air and stool throughout the colon and down into the rectum. The soft tissue shadows of the abdomen are maintained. No worrisome calcifications. The bony structures are intact. Thoracolumbar scoliosis noted. IMPRESSION: No plain film findings for an acute abdominal process. Electronically Signed   By: Rudie MeyerP.  Gallerani M.D.   On: 05/03/2018 08:12   She has noted a dry, itchy rash on her chest and back for about 2 weeks.  It is itchy and irritated  She tried applying some OTC medication  She is not able to tolerate statins- ideally would like to use a statin for her  She has noted some jerking of her legs at night time that can make it hard for her to get to sleep She has some hydroxyzine for itching, which I suggested that Patient Active Problem List   Diagnosis Date Noted  . COPD with acute exacerbation (HCC) 12/10/2017  . Acute on chronic respiratory failure with hypoxia (HCC) 12/10/2017  . Generalized anxiety disorder 12/09/2017  . COPD, group [name], by GOLD 2017 classification (HCC) 08/24/2016  . Nut allergy 09/03/2015  . Abnormal nuclear stress test 06/10/2015  . Family history of colorectal cancer 05/27/2015  . Tobacco abuse 05/27/2015  . 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) 04/28/2015  . Essential hypertension 04/24/2014  . DVT (deep  venous thrombosis) (HCC) 03/10/2012  . 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 Pre-diabetes 02/10/2009  . ENDOMETRIAL POLYP 10/01/2008  . Hypercholesteremia 04/17/2008  . DEPRESSION 04/17/2008  . ASTHMA 04/17/2008  . SCOLIOSIS 04/17/2008  . COLONIC POLYPS, HX OF 04/17/2008    Past Medical History:  Diagnosis Date  . Abnormal nuclear stress test 06/10/2015  . Allergy   . Anxiety   . Arthritis    DDD lumbar; B thumbs  .  Asthma    age 67  . Clotting disorder (HCC)   . Depression   . DVT (deep venous thrombosis) (HCC)   . GERD (gastroesophageal reflux disease)   . Hyperlipidemia   . Hypertension   . Psoriasiform dermatitis     Past Surgical History:  Procedure Laterality Date  . NOSE SURGERY    . TONSILLECTOMY    . TUBAL LIGATION      Social History   Tobacco Use  . Smoking status: Current Every Day Smoker    Packs/day: 0.25    Years: 46.00    Pack years: 11.50    Types: Cigarettes  . Smokeless tobacco: Never Used  Substance Use Topics  . Alcohol use: No  . Drug use: No    Family History  Problem Relation Age of Onset  . Cancer Mother [id number]       colorectal cancer  . Stroke Mother [id number]       CVA  . Alcohol abuse Mother   . Diabetes Mother   . Hypertension Mother   . Heart disease Father   . Cancer Maternal Grandfather        lung  . Alcohol abuse Brother   . Heart attack Brother   . Cancer Maternal Aunt        breast  . Breast cancer Maternal Aunt 75    Allergies  Allergen Reactions  . Acitretin Swelling and Rash  . Pollen Extract Shortness Of Breath    Leaves, pt. reports  . Percodan [Oxycodone-Aspirin]     Pt does not remember  . Statins Other (See Comments)    Muscle aches in legs     Medication list has been reviewed and updated.  Current Outpatient Medications on File Prior to Visit  Medication Sig Dispense Refill  . albuterol (VENTOLIN HFA) 108 (90 Base) MCG/ACT inhaler inhale 2 puffs by mouth every 4 hours as needed for wheezing or shortness of breath 18 g 5  . aspirin 325 MG tablet Take 325 mg by mouth daily.    . cholecalciferol (VITAMIN D) 1000 UNITS tablet Take 2,000 Units by mouth daily.     [name]. escitalopram (LEXAPRO) 10 MG tablet Take 1 tablet (10 mg total) by mouth daily. 90 tablet 3  . hydrOXYzine (ATARAX/VISTARIL) 25 MG tablet Take 25 mg by mouth 3 (three) times daily as needed.    [name]. ipratropium-albuterol (DUONEB) 0.5-2.5 (3) MG/3ML SOLN INHALE 3MLS VIA  NEBULIZER EVERY 6 HOURS AS NEEDED 90 mL 2  . Melatonin 3 MG CAPS Take 3 mg by mouth at bedtime.    . Multiple Vitamins-Minerals (ONE-A-DAY WOMENS 50+ ADVANTAGE) TABS Take 1 tablet by mouth daily.    . busPIRone (BUSPAR) 15 MG tablet Take 1 tablet (15 mg total) by mouth 2 (two) times daily. (Patient not taking: Reported on 05/29/2018) 60 tablet 3   No current facility-administered medications on file prior to visit.     Review of Systems:  As per HPI- otherwise negative. No fever chills, no other rash, no chest pain  Physical Examination: Vitals:   05/29/18 1319  BP: 128/72  Pulse: 77  Resp: 18  Temp: 98.5 F (36.9 [name])  SpO2: 94%   Vitals:   05/29/18 1319  Weight: 141 lb (64 kg)  Height: 5\' 1"  (1.549 m)   Body mass index is 26.64 kg/m. Ideal Body Weight: Weight in (lb) to have BMI = 25: 132  GEN: WDWN, NAD, Non-toxic, A & O x 3, mild overweight, looks well  HEENT: Atraumatic, Normocephalic. Neck supple. No masses, No LAD. Ears and Nose: No external deformity. CV: RRR, No M/G/R. No JVD. No thrill. No extra heart sounds. PULM: CTA B, minimal wheezing as is baseline for this patient, no crackles, rhonchi. No retractions. No resp. distress. No accessory muscle use. EXTR: No [name]/[name]/e NEURO Normal gait.  PSYCH: Normally interactive. Conversant. Not depressed or anxious appearing.  Calm demeanor.  Trunk displays a diffuse rash consistent with eczema or psoriasiform dermatitis. Rash is not severe, but displays scattered patches over her bilateral shoulders, chest, abdomen, and her back  Assessment and Plan: Eczema, unspecified type - Plan: triamcinolone cream (KENALOG) 0.1 %  Essential hypertension - Plan: CBC  Pre-diabetes - Plan: Hemoglobin A1c  Hypnic jerks   Following up today.  Emmagrace has a rash on her trunk which is consistent with eczema or a similar spongiotic dermatitis. We will treat with triamcinolone, which I have asked her to combine with a thick moisturizer such  as Cetaphil. She will use this once or twice a day, but will let me know if not better in a few days. In any case we may need to use an oral steroid  She is somewhat bothered by hypnic jerks of her legs, which make it hard for her to get to sleep.  Suggested that she try the hydroxyzine she has on hand for itching.  She will let me know if this is not helpful Blood pressure is under good control. Check A1c today for follow-up on prediabetes Will plan further follow- up pending labs.  Signed Abbe Amsterdam, MD  The 10-year ASCVD risk score Denman George DC Montez Hageman., et al., 2013) is: 13.9%   Values used to calculate the score:     Age: 21 years     Sex: Female     Is Non-Hispanic African American: No     Diabetic: No     Tobacco smoker: Yes     Systolic Blood Pressure: 128 mmHg     Is BP treated: No     HDL Cholesterol: 75 mg/dL     Total Cholesterol: 241 mg/dL

## 2018-05-29 ENCOUNTER — Encounter: Payer: Self-pay | Admitting: Family Medicine

## 2018-05-29 ENCOUNTER — Ambulatory Visit (INDEPENDENT_AMBULATORY_CARE_PROVIDER_SITE_OTHER): Payer: Medicare Other | Admitting: Family Medicine

## 2018-05-29 VITALS — BP 128/72 | HR 77 | Temp 98.5°F | Resp 18 | Ht 61.0 in | Wt 141.0 lb

## 2018-05-29 DIAGNOSIS — L309 Dermatitis, unspecified: Secondary | ICD-10-CM

## 2018-05-29 DIAGNOSIS — F518 Other sleep disorders not due to a substance or known physiological condition: Secondary | ICD-10-CM | POA: Diagnosis not present

## 2018-05-29 DIAGNOSIS — R7303 Prediabetes: Secondary | ICD-10-CM | POA: Diagnosis not present

## 2018-05-29 DIAGNOSIS — I10 Essential (primary) hypertension: Secondary | ICD-10-CM | POA: Diagnosis not present

## 2018-05-29 DIAGNOSIS — M47816 Spondylosis without myelopathy or radiculopathy, lumbar region: Secondary | ICD-10-CM | POA: Diagnosis not present

## 2018-05-29 DIAGNOSIS — M9903 Segmental and somatic dysfunction of lumbar region: Secondary | ICD-10-CM | POA: Diagnosis not present

## 2018-05-29 MED ORDER — TRIAMCINOLONE ACETONIDE 0.1 % EX CREA: 1.0000 "application " | TOPICAL_CREAM | Freq: Two times a day (BID) | CUTANEOUS | 1 refills | Status: DC

## 2018-05-29 NOTE — Patient Instructions (Signed)
It was nice to see you today, I hope that orthopedics is helpful as far as your back pain. For your rash, I prescribed a steroid cream.  Please mix this with a moisturizer such as Cetaphil, and apply to the affected area once or twice a day. Also take short showers, and avoid baths.  Prolonged exposure to hot water may make your rash worse. If your rash is not looking better in for 5 days please let me know, in this case we might use a oral steroid for you  I will be in touch with your labs  Certainly try the hydroxyzine at bedtime, this may help with your leg jerks

## 2018-05-30 DIAGNOSIS — M9903 Segmental and somatic dysfunction of lumbar region: Secondary | ICD-10-CM | POA: Diagnosis not present

## 2018-05-30 DIAGNOSIS — M47816 Spondylosis without myelopathy or radiculopathy, lumbar region: Secondary | ICD-10-CM | POA: Diagnosis not present

## 2018-06-01 DIAGNOSIS — M5136 Other intervertebral disc degeneration, lumbar region: Secondary | ICD-10-CM | POA: Diagnosis not present

## 2018-06-01 DIAGNOSIS — M545 Low back pain: Secondary | ICD-10-CM | POA: Diagnosis not present

## 2018-06-16 ENCOUNTER — Other Ambulatory Visit: Payer: Self-pay | Admitting: Family Medicine

## 2018-06-16 DIAGNOSIS — Z1231 Encounter for screening mammogram for malignant neoplasm of breast: Secondary | ICD-10-CM

## 2018-07-31 ENCOUNTER — Ambulatory Visit
Admission: RE | Admit: 2018-07-31 | Discharge: 2018-07-31 | Disposition: A | Discharge status: Home / Self Care | Payer: Medicare Other | Source: Ambulatory Visit | Attending: Family Medicine | Admitting: Family Medicine

## 2018-07-31 DIAGNOSIS — Z1231 Encounter for screening mammogram for malignant neoplasm of breast: Secondary | ICD-10-CM | POA: Diagnosis not present

## 2018-12-21 ENCOUNTER — Other Ambulatory Visit: Payer: Self-pay | Admitting: *Deleted

## 2018-12-21 DIAGNOSIS — Z87891 Personal history of nicotine dependence: Secondary | ICD-10-CM

## 2018-12-21 DIAGNOSIS — Z122 Encounter for screening for malignant neoplasm of respiratory organs: Secondary | ICD-10-CM

## 2018-12-21 DIAGNOSIS — F17210 Nicotine dependence, cigarettes, uncomplicated: Secondary | ICD-10-CM

## 2018-12-27 ENCOUNTER — Other Ambulatory Visit: Payer: Self-pay

## 2018-12-28 ENCOUNTER — Ambulatory Visit (INDEPENDENT_AMBULATORY_CARE_PROVIDER_SITE_OTHER): Payer: Medicare Other | Admitting: Family Medicine

## 2018-12-28 ENCOUNTER — Encounter: Payer: Self-pay | Admitting: Family Medicine

## 2018-12-28 VITALS — BP 132/80 | HR 73 | Temp 98.5°F | Resp 18 | Wt 140.0 lb

## 2018-12-28 DIAGNOSIS — Z13220 Encounter for screening for lipoid disorders: Secondary | ICD-10-CM | POA: Diagnosis not present

## 2018-12-28 DIAGNOSIS — Z131 Encounter for screening for diabetes mellitus: Secondary | ICD-10-CM | POA: Diagnosis not present

## 2018-12-28 DIAGNOSIS — Z5181 Encounter for therapeutic drug level monitoring: Secondary | ICD-10-CM

## 2018-12-28 DIAGNOSIS — Z130 Encounter for screening for diseases of the blood and blood-forming organs and certain disorders involving the immune mechanism: Secondary | ICD-10-CM

## 2018-12-28 DIAGNOSIS — J449 Chronic obstructive pulmonary disease, unspecified: Secondary | ICD-10-CM | POA: Diagnosis not present

## 2018-12-28 DIAGNOSIS — F411 Generalized anxiety disorder: Secondary | ICD-10-CM | POA: Diagnosis not present

## 2018-12-28 DIAGNOSIS — R7303 Prediabetes: Secondary | ICD-10-CM | POA: Diagnosis not present

## 2018-12-28 DIAGNOSIS — Z720 Tobacco use: Secondary | ICD-10-CM

## 2018-12-28 MED ORDER — ESCITALOPRAM OXALATE 10 MG PO TABS: 10.0000 mg | ORAL_TABLET | Freq: Every day | ORAL | 3 refills | Status: DC

## 2018-12-28 NOTE — Progress Notes (Addendum)
[facility] [address], [address] [place], [place] [address] [phone] [phone]  Date:  12/28/2018   Name:  Tasha [name]   DOB:  1950/11/22   MRN:  [id number]  PCP:  Pearline Cablesopland, [name], MD    Chief Complaint: Medication Refill (follow up), Hypertension, and Anxiety   History of Present Illness:  Tasha [name] is a 68 y.o. very pleasant female patient who presents with the following:  Here today for a follow-up visit History of COPD, HTN, smoking, pre-diabetes, hyperlipidemia and psoriaform dermatitis She is still a smoker- she has cut back however, might do a 1/2 PPD  She continues to try and cut down  IUTD - can do shingrix next year if she likes  Labs done in December- would like to do today   She is generally feeling well She feels good with lexapro - needs a refill today, wishes to continue this dose  Her anxiety is really doing ok right now - she is not taking buspar right now and does not miss it  Her screening CT is scheduled for 9/14  Her breathing is doing ok  She is using OTC Primatene mist some as it is cheaper than her rx inhaler and seems to work well  She has been sober for 33 years!  Great news   Lab Results  Component Value Date   HGBA1C 6.4 11/28/2017    Patient Active Problem List   Diagnosis Date Noted  . COPD with acute exacerbation (HCC) 12/10/2017  . Acute on chronic respiratory failure with hypoxia (HCC) 12/10/2017  . Generalized anxiety disorder 12/09/2017  . COPD, group C, by GOLD 2017 classification (HCC) 08/24/2016  . Nut allergy 09/03/2015  . Abnormal nuclear stress test 06/10/2015  . Family history of colorectal cancer 05/27/2015  . Tobacco abuse 05/27/2015  . 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) 04/28/2015  . Essential hypertension 04/24/2014  . DVT (deep venous thrombosis) (HCC) 03/10/2012  . 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 Pre-diabetes 02/10/2009  . ENDOMETRIAL POLYP 10/01/2008  .  Hypercholesteremia 04/17/2008  . DEPRESSION 04/17/2008  . ASTHMA 04/17/2008  . SCOLIOSIS 04/17/2008  . COLONIC POLYPS, HX OF 04/17/2008    Past Medical History:  Diagnosis Date  . Abnormal nuclear stress test 06/10/2015  . Allergy   . Anxiety   . Arthritis    DDD lumbar; B thumbs  . Asthma    age 68  . Clotting disorder (HCC)   . Depression   . DVT (deep venous thrombosis) (HCC)   . GERD (gastroesophageal reflux disease)   . Hyperlipidemia   . Hypertension   . Psoriasiform dermatitis     Past Surgical History:  Procedure Laterality Date  . NOSE SURGERY    . TONSILLECTOMY    . TUBAL LIGATION      Social History   Tobacco Use  . Smoking status: Current Every Day Smoker    Packs/day: 0.25    Years: 46.00    Pack years: 11.50    Types: Cigarettes  . Smokeless tobacco: Never Used  Substance Use Topics  . Alcohol use: No  . Drug use: No    Family History  Problem Relation Age of Onset  . Cancer Mother 4472       colorectal cancer  . Stroke Mother 8170       CVA  . Alcohol abuse Mother   . Diabetes Mother   . Hypertension Mother   . Heart  disease Father   . Cancer Maternal Grandfather        lung  . Alcohol abuse Brother   . Heart attack Brother   . Cancer Maternal Aunt        breast  . Breast cancer Maternal Aunt 75    Allergies  Allergen Reactions  . Acitretin Swelling and Rash  . Pollen Extract Shortness Of Breath    Leaves, pt. reports  . Percodan [Oxycodone-Aspirin]     Pt does not remember  . Statins Other (See Comments)    Muscle aches in legs     Medication list has been reviewed and updated.  Current Outpatient Medications on File Prior to Visit  Medication Sig Dispense Refill  . albuterol (VENTOLIN HFA) 108 (90 Base) MCG/ACT inhaler inhale 2 puffs by mouth every 4 hours as needed for wheezing or shortness of breath 18 g 5  . aspirin 325 MG tablet Take 325 mg by mouth daily.    . diphenhydrAMINE HCl, Sleep, (SLEEP AID) 25 MG CAPS Take by  mouth.    [name] ipratropium-albuterol (DUONEB) 0.5-2.5 (3) MG/3ML SOLN INHALE 3MLS VIA NEBULIZER EVERY 6 HOURS AS NEEDED 90 mL 2  . Melatonin 3 MG CAPS Take 3 mg by mouth at bedtime.    . busPIRone (BUSPAR) 15 MG tablet Take 1 tablet (15 mg total) by mouth 2 (two) times daily. (Patient not taking: Reported on 05/29/2018) 60 tablet 3  . hydrOXYzine (ATARAX/VISTARIL) 25 MG tablet Take 25 mg by mouth 3 (three) times daily as needed.     No current facility-administered medications on file prior to visit.     Review of Systems:  As per HPI- otherwise negative. No fever or chills No CP or SOB   Physical Examination: Vitals:   12/28/18 1514  BP: 132/80  Pulse: 73  Resp: 18  Temp: 98.5 F (36.9 C)  SpO2: 94%   Vitals:   12/28/18 1514  Weight: 140 lb (63.5 kg)   Body mass index is 26.45 kg/m. Ideal Body Weight:    GEN: WDWN, NAD, Non-toxic, A & O x 3, mild overweight, looks well  HEENT: Atraumatic, Normocephalic. Neck supple. No masses, No LAD. Ears and Nose: No external deformity. CV: RRR, No M/G/R. No JVD. No thrill. No extra heart sounds. PULM: CTA B, no wheezes, crackles, rhonchi. No retractions. No resp. distress. No accessory muscle use. ABD: S, NT, ND. No rebound. No HSM. EXTR: No c/c/e NEURO Normal gait.  PSYCH: Normally interactive. Conversant. Not depressed or anxious appearing.  Calm demeanor.    Assessment and Plan:   ICD-10-CM   1. Generalized anxiety disorder  F41.1 escitalopram (LEXAPRO) 10 MG tablet  2. Screening for hyperlipidemia  Z13.220 Lipid panel  3. Screening for diabetes mellitus  Z13.1 Comprehensive metabolic panel    Hemoglobin A1c  4. Screening for deficiency anemia  Z13.0 CBC  5. COPD, group C, by GOLD 2017 classification ([place])  J44.9   6. Medication monitoring encounter  Z51.81 CBC  7. Pre-diabetes  R73.03 Hemoglobin A1c  8. Tobacco abuse  Z72.0    Following up today Not taking buspar and doing ok without it for now Her CT scan for lung  cancer screening is scheduled Await labs as above BP ok Continue to encourage DC smoking   Follow-up: No follow-ups on file.  Meds ordered this encounter  Medications  . escitalopram (LEXAPRO) 10 MG tablet    Sig: Take 1 tablet (10 mg total) by mouth daily.  Dispense:  90 tablet    Refill:  3   Orders Placed This Encounter  Procedures  . CBC  . Comprehensive metabolic panel  . Hemoglobin A1c  . Lipid panel    @SIGN @    Signed Abbe AmsterdamJessica Copland, MD  Received her labs 7/31- letter to pt  Results for orders placed or performed in visit on 12/28/18  CBC  Result Value Ref Range   WBC 8.4 4.0 - 10.5 K/uL   RBC 4.81 3.87 - 5.11 Mil/uL   Platelets 310.0 150.0 - 400.0 K/uL   Hemoglobin 14.9 12.0 - 15.0 g/dL   HCT 16.145.5 09.636.0 - 04.546.0 %   MCV 94.5 78.0 - 100.0 fl   MCHC 32.7 30.0 - 36.0 g/dL   RDW 40.914.6 81.111.5 - 91.415.5 %  Comprehensive metabolic panel  Result Value Ref Range   Sodium 142 135 - 145 mEq/L   Potassium 4.6 3.5 - 5.1 mEq/L   Chloride 108 96 - 112 mEq/L   CO2 28 19 - 32 mEq/L   Glucose, Bld 98 70 - 99 mg/dL   BUN 26 (H) 6 - 23 mg/dL   Creatinine, Ser 7.820.81 0.40 - 1.20 mg/dL   Total Bilirubin 0.5 0.2 - 1.2 mg/dL   Alkaline Phosphatase 82 39 - 117 U/L   AST 18 0 - 37 U/L   ALT 19 0 - 35 U/L   Total Protein 6.8 6.0 - 8.3 g/dL   Albumin 4.3 3.5 - 5.2 g/dL   Calcium 9.9 8.4 - 95.610.5 mg/dL   GFR 21.3069.84 >86.57>60.00 mL/min  Hemoglobin A1c  Result Value Ref Range   Hgb A1c MFr Bld 6.0 4.6 - 6.5 %  Lipid panel  Result Value Ref Range   Cholesterol 259 (H) 0 - 200 mg/dL   Triglycerides 84.672.0 0.0 - 149.0 mg/dL   HDL 96.2976.70 >52.84>39.00 mg/dL   VLDL 13.214.4 0.0 - 44.040.0 mg/dL   LDL Cholesterol 102168 (H) 0 - 99 mg/dL   Total CHOL/HDL Ratio 3    NonHDL 182.20

## 2018-12-28 NOTE — Patient Instructions (Signed)
It was good to see you today as always-  I will be in touch with your labs asap  Take care!  Let's plan to visit in 6 months

## 2018-12-29 LAB — CBC
HCT: 45.5 % (ref 36.0–46.0)
Hemoglobin: 14.9 g/dL (ref 12.0–15.0)
MCHC: 32.7 g/dL (ref 30.0–36.0)
MCV: 94.5 fl (ref 78.0–100.0)
Platelets: 310 10*3/uL (ref 150.0–400.0)
RBC: 4.81 Mil/uL (ref 3.87–5.11)
RDW: 14.6 % (ref 11.5–15.5)
WBC: 8.4 10*3/uL (ref 4.0–10.5)

## 2018-12-29 LAB — COMPREHENSIVE METABOLIC PANEL
ALT: 19 U/L (ref 0–35)
AST: 18 U/L (ref 0–37)
Albumin: 4.3 g/dL (ref 3.5–5.2)
Alkaline Phosphatase: 82 U/L (ref 39–117)
BUN: 26 mg/dL — ABNORMAL HIGH (ref 6–23)
CO2: 28 mEq/L (ref 19–32)
Calcium: 9.9 mg/dL (ref 8.4–10.5)
Chloride: 108 mEq/L (ref 96–112)
Creatinine, Ser: 0.81 mg/dL (ref 0.40–1.20)
GFR: 69.84 mL/min (ref >60.00)
Glucose, Bld: 98 mg/dL (ref 70–99)
Potassium: 4.6 mEq/L (ref 3.5–5.1)
Sodium: 142 mEq/L (ref 135–145)
Total Bilirubin: 0.5 mg/dL (ref 0.2–1.2)
Total Protein: 6.8 g/dL (ref 6.0–8.3)

## 2018-12-29 LAB — HEMOGLOBIN A1C: Hgb A1c MFr Bld: 6 % (ref 4.6–6.5)

## 2018-12-29 LAB — LIPID PANEL
Cholesterol: 259 mg/dL — ABNORMAL HIGH (ref 0–200)
HDL: 76.7 mg/dL (ref >39.00)
LDL Cholesterol: 168 mg/dL — ABNORMAL HIGH (ref 0–99)
NonHDL: 182.2
Total CHOL/HDL Ratio: 3
Triglycerides: 72 mg/dL (ref 0.0–149.0)
VLDL: 14.4 mg/dL (ref 0.0–40.0)

## 2019-01-09 ENCOUNTER — Telehealth: Payer: Self-pay | Admitting: Family Medicine

## 2019-01-09 DIAGNOSIS — E7800 Pure hypercholesterolemia, unspecified: Secondary | ICD-10-CM

## 2019-01-09 NOTE — Telephone Encounter (Signed)
Pt called and stated that she received labs in the mail and the patient is okay with starting the statin every other day. Pt would like a call back from the nurse regarding. Please advise .  Cell# [phone]

## 2019-01-10 NOTE — Telephone Encounter (Signed)
Pt says she is ok with taking simvastatin  Please send to [facility]

## 2019-01-10 NOTE — Telephone Encounter (Signed)
Pt stated she would like a Rx for simvastatin to be sent to [facility]

## 2019-01-11 MED ORDER — SIMVASTATIN 5 MG PO TABS: 10.0000 mg | ORAL_TABLET | Freq: Every day | ORAL | 3 refills | Status: DC

## 2019-01-11 NOTE — Telephone Encounter (Signed)
Ok done

## 2019-01-11 NOTE — Addendum Note (Signed)
Addended by: Lamar Blinks C on: 01/11/2019 12:18 PM   Modules accepted: Orders

## 2019-01-11 NOTE — Telephone Encounter (Signed)
Please advise 

## 2019-01-15 ENCOUNTER — Telehealth: Payer: Self-pay

## 2019-01-15 DIAGNOSIS — E7800 Pure hypercholesterolemia, unspecified: Secondary | ICD-10-CM

## 2019-01-15 MED ORDER — SIMVASTATIN 5 MG PO TABS: ORAL_TABLET | ORAL | 3 refills | Status: DC

## 2019-01-15 NOTE — Telephone Encounter (Signed)
Pt's insurance doesn't cover simvastatin 5mg  2 tabs daily- max dose is 1.5 tabs daily.

## 2019-02-07 NOTE — Progress Notes (Signed)
Virtual Visit via Video Note  I connected with patient on 02/08/19 at 11:00 AM EDT by audio enabled telemedicine application and verified that I am speaking with the correct person using two identifiers.   THIS ENCOUNTER IS A VIRTUAL VISIT DUE TO COVID-19 - PATIENT WAS NOT SEEN IN THE OFFICE. PATIENT HAS CONSENTED TO VIRTUAL VISIT / TELEMEDICINE VISIT   Location of patient: home  Location of provider: office  I discussed the limitations of evaluation and management by telemedicine and the availability of in person appointments. The patient expressed understanding and agreed to proceed.   Subjective:   Tasha [name] is a 68 y.o. female who presents for Medicare Annual (Subsequent) preventive examination.  Review of Systems:     Home Safety/Smoke Alarms: Feels safe in home. Smoke alarms in place.  Lives alone with dog in 1 story home.   Female:   Mammo- 07/31/18      Dexa scan-  ordered      CCS-03/06/14. Objective:     Vitals: Unable to assess. This visit is enabled though telemedicine due to Covid 19.   Advanced Directives 02/08/2019 12/11/2017 05/27/2015  Does Patient Have a Medical Advance Directive? No Yes Yes  Type of Advance Directive - Healthcare Power of Attorney Living will  Does patient want to make changes to medical advance directive? - No - Patient declined No - Patient declined  Copy of Healthcare Power of Attorney in Chart? - No - copy requested No - copy requested  Would patient like information on creating a medical advance directive? No - Patient declined - -    Tobacco Social History   Tobacco Use  Smoking Status Current Every Day Smoker  . Packs/day: 0.50  . Years: 46.00  . Pack years: 23.00  . Types: Cigarettes  Smokeless Tobacco Never Used     Ready to quit: No Counseling given: No   Clinical Intake: Pain : No/denies pain   Past Medical History:  Diagnosis Date  . Abnormal nuclear stress test 06/10/2015  . Allergy   . Anxiety   .  Arthritis    DDD lumbar; B thumbs  . Asthma    age 68  . Clotting disorder (HCC)   . Depression   . DVT (deep venous thrombosis) (HCC)   . GERD (gastroesophageal reflux disease)   . Hyperlipidemia   . Hypertension   . Psoriasiform dermatitis    Past Surgical History:  Procedure Laterality Date  . NOSE SURGERY    . TONSILLECTOMY    . TUBAL LIGATION     Family History  Problem Relation Age of Onset  . Cancer Mother [id number]       colorectal cancer  . Stroke Mother [id number]       CVA  . Alcohol abuse Mother   . Diabetes Mother   . Hypertension Mother   . Heart disease Father   . Cancer Maternal Grandfather        lung  . Alcohol abuse Brother   . Heart attack Brother   . Cancer Maternal Aunt        breast  . Breast cancer Maternal Aunt [id number]   Social History   Socioeconomic History  . Marital status: Single    Spouse name: Not on file  . Number of children: Not on file  . Years of education: Not on file  . Highest education level: Not on file  Occupational History  . Not on file  Social Needs  . Physicist, medicalinancial resource  strain: Not on file  . Food insecurity    Worry: Not on file    Inability: Not on file  . Transportation needs    Medical: Not on file    Non-medical: Not on file  Tobacco Use  . Smoking status: Current Every Day Smoker    Packs/day: 0.50    Years: 46.00    Pack years: 23.00    Types: Cigarettes  . Smokeless tobacco: Never Used  Substance and Sexual Activity  . Alcohol use: No  . Drug use: No  . Sexual activity: Not on file  Lifestyle  . Physical activity    Days per week: Not on file    Minutes per session: Not on file  . Stress: Not on file  Relationships  . Social Herbalist on phone: Not on file    Gets together: Not on file    Attends religious service: Not on file    Active member of club or organization: Not on file    Attends meetings of clubs or organizations: Not on file    Relationship status: Not on file  Other Topics Concern   . Not on file  Social History Narrative   Marital status: single; not dating and not interested      Children: none      Lives: alone with dog      Employment:  Semi-retired; works a little bit; Tree surgeon since 1980; maintains one client      Tobacco: 1 ppd x 50 years      Alcohol: quit; [place] SINCE 09/25/1985.  Attends AA twice weekly; still sponsors others.      Drugs:  None      Exercise:       ADLs: independent with ADLs; no assistant devices.      Advanced Directives:  Yes.  desires FULL CODE.    Outpatient Encounter Medications as of 02/08/2019  Medication Sig  . albuterol (VENTOLIN HFA) 108 (90 Base) MCG/ACT inhaler inhale 2 puffs by mouth every 4 hours as needed for wheezing or shortness of breath  . aspirin 325 MG tablet Take 325 mg by mouth daily.  . busPIRone (BUSPAR) 15 MG tablet Take 1 tablet (15 mg total) by mouth 2 (two) times daily.  [name] escitalopram (LEXAPRO) 10 MG tablet Take 1 tablet (10 mg total) by mouth daily.  . hydrOXYzine (ATARAX/VISTARIL) 25 MG tablet Take 25 mg by mouth 3 (three) times daily as needed.  [name] ipratropium-albuterol (DUONEB) 0.5-2.5 (3) MG/3ML SOLN INHALE 3MLS VIA NEBULIZER EVERY 6 HOURS AS NEEDED  . Melatonin 3 MG CAPS Take 3 mg by mouth at bedtime.  . simvastatin (ZOCOR) 5 MG tablet Take one by mouth every other day  . diphenhydrAMINE HCl, Sleep, (SLEEP AID) 25 MG CAPS Take by mouth.   No facility-administered encounter medications on file as of 02/08/2019.     Activities of Daily Living In your present state of health, do you have any difficulty performing the following activities: 02/08/2019  Hearing? N  Vision? N  Difficulty concentrating or making decisions? N  Walking or climbing stairs? N  Dressing or bathing? N  Doing errands, shopping? N  Preparing Food and eating ? N  Using the Toilet? N  In the past six months, have you accidently leaked urine? N  Do you have problems with loss of bowel control? N  Managing your  Medications? N  Managing your Finances? N  Housekeeping or managing your Housekeeping? N  Some recent data might be hidden    Patient Care Team: Copland, Gwenlyn Found, MD as PCP - General (Family Medicine) [name], MD (Family Medicine)    Assessment:   This is a routine wellness examination for Tasha [name]. Physical assessment deferred to PCP.  Exercise Activities and Dietary recommendations Current Exercise Habits: Home exercise routine, Type of exercise: walking, Time (Minutes): 15, Frequency (Times/Week): 7, Weekly Exercise (Minutes/Week): 105, Intensity: Mild, Exercise limited by: None identified   Diet (meal preparation, eat out, water intake, caffeinated beverages, dairy products, fruits and vegetables): 24 hr recall Breakfast: protein shake Lunch: chicken and strawberries Dinner:  Chicken salad  Goals   None     Fall Risk Fall Risk  02/08/2019 11/28/2017 12/05/2015 10/04/2015 04/28/2015  Falls in the past year? 0 No No No No    Depression Screen PHQ 2/9 Scores 02/08/2019 11/29/2017 11/28/2017 12/05/2015  PHQ - 2 Score 0 0 0 0     Cognitive Function Ad8 score reviewed for issues:  Issues making decisions:no  Less interest in hobbies / activities:no  Repeats questions, stories (family complaining):no  Trouble using ordinary gadgets (microwave, computer, phone):no  Forgets the month or year: no  Mismanaging finances: no  Remembering appts:no  Daily problems with thinking and/or memory:no Ad8 score is=0         Immunization History  Administered Date(s) Administered  . Influenza Split 04/10/2012  . Influenza Whole 04/17/2008, 04/29/2009  . Influenza, High Dose Seasonal PF 04/08/2016, 03/31/2017, 02/01/2018  . Influenza,inj,Quad PF,6+ Mos 05/07/2014, 04/14/2015  . Pneumococcal Conjugate-13 02/01/2018  . Pneumococcal Polysaccharide-23 04/17/2008, 04/29/2009  . Td 06/04/2002, 10/23/2014  . Zoster 05/31/2014    Screening Tests Health Maintenance  Topic Date  Due  . INFLUENZA VACCINE  12/30/2018  . PNA vac Low Risk Adult (2 of 2 - PPSV23) 02/02/2019  . MAMMOGRAM  07/30/2020  . COLONOSCOPY  03/06/2024  . TETANUS/TDAP  10/22/2024  . DEXA SCAN  Completed  . Hepatitis C Screening  Completed      Plan:    Please schedule your next medicare wellness visit with me in 1 yr.  Continue to eat heart healthy diet (full of fruits, vegetables, whole grains, lean protein, water--limit salt, fat, and sugar intake) and increase physical activity as tolerated.  Continue doing brain stimulating activities (puzzles, reading, adult coloring books, staying active) to keep memory sharp.   Bring a copy of your living will and/or healthcare power of attorney to your next office visit. I have emailed you the forms.     I have personally reviewed and noted the following in the patient's chart:   . Medical and social history . Use of alcohol, tobacco or illicit drugs  . Current medications and supplements . Functional ability and status . Nutritional status . Physical activity . Advanced directives . List of other physicians . Hospitalizations, surgeries, and ER visits in previous 12 months . Vitals . Screenings to include cognitive, depression, and falls . Referrals and appointments  In addition, I have reviewed and discussed with patient certain preventive protocols, quality metrics, and best practice recommendations. A written personalized care plan for preventive services as well as general preventive health recommendations were provided to patient.     [place]on Gully, [place]  02/08/2019

## 2019-02-08 ENCOUNTER — Ambulatory Visit (INDEPENDENT_AMBULATORY_CARE_PROVIDER_SITE_OTHER): Payer: Medicare Other | Admitting: *Deleted

## 2019-02-08 ENCOUNTER — Encounter: Payer: Self-pay | Admitting: *Deleted

## 2019-02-08 ENCOUNTER — Other Ambulatory Visit: Payer: Self-pay

## 2019-02-08 DIAGNOSIS — Z0000 Encounter for general adult medical examination without abnormal findings: Secondary | ICD-10-CM

## 2019-02-08 DIAGNOSIS — Z780 Asymptomatic menopausal state: Secondary | ICD-10-CM | POA: Diagnosis not present

## 2019-02-08 NOTE — Patient Instructions (Signed)
Please schedule your next medicare wellness visit with me in 1 yr.  Continue to eat heart healthy diet (full of fruits, vegetables, whole grains, lean protein, water--limit salt, fat, and sugar intake) and increase physical activity as tolerated.  Continue doing brain stimulating activities (puzzles, reading, adult coloring books, staying active) to keep memory sharp.   Bring a copy of your living will and/or healthcare power of attorney to your next office visit. I have emailed you the forms.    Tasha [name] , Thank you for taking time to come for your Medicare Wellness Visit. I appreciate your ongoing commitment to your health goals. Please review the following plan we discussed and let me know if I can assist you in the future.   These are the goals we discussed: Goals   None     This is a list of the screening recommended for you and due dates:  Health Maintenance  Topic Date Due  . Flu Shot  12/30/2018  . Pneumonia vaccines (2 of 2 - PPSV23) 02/02/2019  . Mammogram  07/30/2020  . Colon Cancer Screening  03/06/2024  . Tetanus Vaccine  10/22/2024  . DEXA scan (bone density measurement)  Completed  .  Hepatitis C: One time screening is recommended by Center for Disease Control  (CDC) for  adults born from [birth year] through 1967.   Completed

## 2019-02-12 ENCOUNTER — Ambulatory Visit (INDEPENDENT_AMBULATORY_CARE_PROVIDER_SITE_OTHER)
Admission: RE | Admit: 2019-02-12 | Discharge: 2019-02-12 | Disposition: A | Discharge status: Home / Self Care | Payer: Medicare Other | Source: Ambulatory Visit | Attending: Acute Care | Admitting: Acute Care

## 2019-02-12 ENCOUNTER — Telehealth: Payer: Self-pay | Admitting: Pulmonary Disease

## 2019-02-12 ENCOUNTER — Other Ambulatory Visit: Payer: Self-pay

## 2019-02-12 DIAGNOSIS — Z87891 Personal history of nicotine dependence: Secondary | ICD-10-CM | POA: Diagnosis not present

## 2019-02-12 DIAGNOSIS — F17210 Nicotine dependence, cigarettes, uncomplicated: Secondary | ICD-10-CM

## 2019-02-12 DIAGNOSIS — Z122 Encounter for screening for malignant neoplasm of respiratory organs: Secondary | ICD-10-CM

## 2019-02-12 NOTE — Telephone Encounter (Signed)
[facility] radiology called to make sure were aware of additional findings on pt's LDCT.   IMPRESSION: 1. Lung-RADS 2-S, benign appearance or behavior. Continue annual screening with low-dose chest CT without contrast in 12 months. 2. The "S" modifier above refers to potentially clinically significant non lung cancer related findings. Specifically, new mild bilateral axillary lymphadenopathy. Management options include referral to a women's imaging center for diagnostic mammographic/ultrasound evaluation, clinical follow-up and/or follow-up chest CT with IV contrast in 3-6 months. 3. Stable left adrenal nodule, presumably a benign adenoma. 4. Three-vessel coronary atherosclerosis.  Aortic Atherosclerosis (ICD10-I70.0) and Emphysema (ICD10-J43.9).  These results will be called to the ordering clinician or representative by the Radiologist Assistant, and communication documented in the PACS or zVision Dashboard.

## 2019-02-12 NOTE — Telephone Encounter (Signed)
Thanks, I will make sure to let her PCP know and we can decide  on the best follow up. Thanks

## 2019-02-13 ENCOUNTER — Telehealth: Payer: Self-pay | Admitting: Pulmonary Disease

## 2019-02-13 ENCOUNTER — Telehealth: Payer: Self-pay | Admitting: Family Medicine

## 2019-02-13 ENCOUNTER — Telehealth: Payer: Self-pay | Admitting: Acute Care

## 2019-02-13 DIAGNOSIS — R590 Localized enlarged lymph nodes: Secondary | ICD-10-CM

## 2019-02-13 NOTE — Telephone Encounter (Signed)
Dr. Lorelei Pont,  Eric Form, NP called to notify you of pt CT result findings. Pt has been made aware of results. Sarah informed her that Dr. Lorelei Pont would be notified of results & that PCP office would contact pt about referral for MM and [place].   "Mild bilateral axillary lymphadenopathy measuring up to 1.5 cm on the right (series 2/image 9) and 1.9 cm on the left (series 2/image 9), increased since 09/12/2017 chest CT. No pathologically enlarged mediastinal or discrete hilar nodes.  Radiology recommends mammographic/ultrasound evaluation"

## 2019-02-13 NOTE — Telephone Encounter (Signed)
Patient calling back to go over CT results. Will route to SG to go over results with patient. Patient states she will await Sarah's phone call with results and recommendations

## 2019-02-13 NOTE — Telephone Encounter (Signed)
Lung-RADS 2-S, benign appearance or behavior. Continue annual screening with low-dose chest CT without contrast in 12 months. The "S" modifier above refers to potentially clinically significant non lung cancer related findings. Specifically, new mild bilateral axillary lymphadenopathy. Management options include referral to a women's imaging center for diagnostic mammographic/ultrasound evaluation, clinical follow-up and/or follow-up chest CT with IV contrast in 3-6 months. Stable left adrenal nodule, presumably a benign adenoma. Three-vessel coronary atherosclerosis.  Mild bilateral axillary lymphadenopathy measuring up to 1.5 cm on the right (series 2/image 9) and 1.9 cm on the left (series 2/image 9), increased since 09/12/2017 chest CT. No pathologically enlarged mediastinal or discrete hilar nodes.  Radiology recommends mammographic/ultrasound evaluation  I have called the results to the patient. She verbalized understanding. I have called the  patient's PCP ( Jessica Copland ) regarding lymphadenopathy and need for follow up.They will order the follow up imaging.Pt. is aware.

## 2019-02-13 NOTE — Telephone Encounter (Signed)
I have called both the home and mobile numbers for Tasha [name]. There was no answer at either. I have requested the patient call the office to allow me to review the results with her. This is my second attempt to call this patient with her CT results.She has the office contact information. If we do not hear from the patient we will re-attempt.

## 2019-02-14 NOTE — Telephone Encounter (Signed)
Called patient and left message on her machine.  I will set her up for diagnostic mammogram and ultrasound to address axillary lymphadenopathy. Placed orders for same

## 2019-02-14 NOTE — Telephone Encounter (Signed)
Another encounter open for this patient. Regarding the results. Patient is to call back to talk to Judson Roch

## 2019-02-15 NOTE — Telephone Encounter (Signed)
See related telephone note dated 02/13/19 from Eric Form. Nothing further needed at this time.

## 2019-02-16 NOTE — Telephone Encounter (Signed)
See other telephone note dated 02/13/19 regarding lung cancer screening CT results. Will close this message.

## 2019-02-22 ENCOUNTER — Ambulatory Visit
Admission: RE | Admit: 2019-02-22 | Discharge: 2019-02-22 | Disposition: A | Discharge status: Home / Self Care | Payer: Medicare Other | Source: Ambulatory Visit | Attending: Family Medicine | Admitting: Family Medicine

## 2019-02-22 ENCOUNTER — Other Ambulatory Visit: Payer: Self-pay | Admitting: Family Medicine

## 2019-02-22 ENCOUNTER — Other Ambulatory Visit: Payer: Self-pay

## 2019-02-22 DIAGNOSIS — R590 Localized enlarged lymph nodes: Secondary | ICD-10-CM

## 2019-02-22 DIAGNOSIS — R922 Inconclusive mammogram: Secondary | ICD-10-CM | POA: Diagnosis not present

## 2019-02-22 DIAGNOSIS — N6489 Other specified disorders of breast: Secondary | ICD-10-CM | POA: Diagnosis not present

## 2019-03-10 DIAGNOSIS — Z23 Encounter for immunization: Secondary | ICD-10-CM | POA: Diagnosis not present

## 2019-05-08 ENCOUNTER — Ambulatory Visit
Admission: RE | Admit: 2019-05-08 | Discharge: 2019-05-08 | Disposition: A | Discharge status: Home / Self Care | Payer: Medicare Other | Source: Ambulatory Visit | Attending: Family Medicine | Admitting: Family Medicine

## 2019-05-08 ENCOUNTER — Other Ambulatory Visit: Payer: Self-pay

## 2019-05-08 DIAGNOSIS — M85852 Other specified disorders of bone density and structure, left thigh: Secondary | ICD-10-CM | POA: Diagnosis not present

## 2019-05-08 DIAGNOSIS — Z780 Asymptomatic menopausal state: Secondary | ICD-10-CM

## 2019-05-09 ENCOUNTER — Encounter: Payer: Self-pay | Admitting: Family Medicine

## 2019-05-14 ENCOUNTER — Telehealth: Payer: Self-pay | Admitting: Family Medicine

## 2019-05-14 DIAGNOSIS — M810 Age-related osteoporosis without current pathological fracture: Secondary | ICD-10-CM

## 2019-05-14 DIAGNOSIS — Z780 Asymptomatic menopausal state: Secondary | ICD-10-CM

## 2019-05-14 MED ORDER — RISEDRONATE SODIUM 35 MG PO TABS: 35.0000 mg | ORAL_TABLET | ORAL | 3 refills | Status: DC

## 2019-05-14 NOTE — Telephone Encounter (Signed)
Please advise 

## 2019-05-14 NOTE — Telephone Encounter (Signed)
Done- used actonel instead for cost savings

## 2019-05-14 NOTE — Telephone Encounter (Signed)
Patient states she received a letter in the mail stating PCP would like to prescribe "fosamax" and patient states she's in agreement and would like Rx sent to    [facility] [address], [place]

## 2019-07-26 ENCOUNTER — Ambulatory Visit: Payer: Medicare Other | Attending: Internal Medicine

## 2019-07-26 DIAGNOSIS — Z23 Encounter for immunization: Secondary | ICD-10-CM

## 2019-07-26 NOTE — Progress Notes (Signed)
   Covid-19 Vaccination Clinic  Name:  Tasha [name]    MRN: [id number] DOB: 10/21/1950  07/26/2019  [name] was observed post Covid-19 immunization for 15 minutes without incidence. She was provided with Vaccine Information Sheet and instruction to access the V-Safe system.   [name] was instructed to call 911 with any severe reactions post vaccine: [name] Difficulty breathing  . Swelling of your face and throat  . A fast heartbeat  . A bad rash all over your body  . Dizziness and weakness    Immunizations Administered    Name Date Dose VIS Date Route   Pfizer COVID-19 Vaccine 07/26/2019  1:22 PM 0.3 mL 05/11/2019 Intramuscular   Manufacturer: [employer], [employer]   Lot: [id number]   NDC: [address]-4

## 2019-07-29 NOTE — Progress Notes (Addendum)
[facility] [address] [place], [place] [address] [phone]  Date:  07/30/2019   Name:  Tasha [name]   DOB:  07/27/1950   MRN:  [id number]  PCP:  Pearline Cables, MD    Chief Complaint: Poor Circulation (follow up)   History of Present Illness:  Tasha [name] is a 69 y.o. 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  She has no symptoms of claudication I asked her to please clarify what exactly her chiropractor meant by poor circulation at her next visit.  If he has a particular concern, such as carotid artery stenosis, we can certainly follow-up  She is using over-the-counter Primatene Mist inhaler, it is much cheaper than albuterol.  She does have a nebulizer machine, I will refill her duo nebs to use as needed  Patient Active Problem List   Diagnosis Date Noted  . COPD with acute exacerbation (HCC) 12/10/2017  . Acute on chronic  respiratory failure with hypoxia (HCC) 12/10/2017  . Generalized anxiety disorder 12/09/2017  . COPD, group C, by GOLD 2017 classification (HCC) 08/24/2016  . Nut allergy 09/03/2015  . Abnormal nuclear stress test 06/10/2015  . Family history of colorectal cancer 05/27/2015  . Tobacco abuse 05/27/2015  . 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) 04/28/2015  . Essential hypertension 04/24/2014  . DVT (deep venous thrombosis) (HCC) 03/10/2012  . 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 Pre-diabetes 02/10/2009  . ENDOMETRIAL POLYP 10/01/2008  . Hypercholesteremia 04/17/2008  . DEPRESSION 04/17/2008  . ASTHMA 04/17/2008  . SCOLIOSIS 04/17/2008  . COLONIC POLYPS, HX OF 04/17/2008    Past Medical History:  Diagnosis Date  . Abnormal nuclear stress test 06/10/2015  . Allergy   . Anxiety   . Arthritis    DDD lumbar; B thumbs  . Asthma    age 77  . Clotting disorder (HCC)   . Depression   . DVT (deep venous thrombosis) (HCC)   . GERD (gastroesophageal reflux disease)   . Hyperlipidemia   . Hypertension   . Psoriasiform dermatitis     Past Surgical History:  Procedure Laterality Date  . NOSE SURGERY    . TONSILLECTOMY    . TUBAL LIGATION      Social History   Tobacco Use  . Smoking status: Current Every Day Smoker    Packs/day: 0.50    Years: 46.00    Pack years: 23.00    Types: Cigarettes  . Smokeless tobacco: Never Used  Substance Use Topics  . Alcohol use:  No  . Drug use: No    Family History  Problem Relation Age of Onset  . Cancer Mother 6       colorectal cancer  . Stroke Mother 43       CVA  . Alcohol abuse Mother   . Diabetes Mother   . Hypertension Mother   . Heart disease Father   . Cancer Maternal Grandfather        lung  . Alcohol abuse Brother   . Heart attack Brother   . Cancer Maternal Aunt        breast  . Breast cancer Maternal Aunt 75    Allergies  Allergen Reactions  . Acitretin Swelling and Rash  . Pollen Extract Shortness Of Breath     Leaves, pt. reports  . Percodan [Oxycodone-Aspirin]     Pt does not remember  . Statins Other (See Comments)    Muscle aches in legs     Medication list has been reviewed and updated.  Current Outpatient Medications on File Prior to Visit  Medication Sig Dispense Refill  . albuterol (VENTOLIN HFA) 108 (90 Base) MCG/ACT inhaler inhale 2 puffs by mouth every 4 hours as needed for wheezing or shortness of breath 18 g 5  . aspirin 325 MG tablet Take 325 mg by mouth daily.    . busPIRone (BUSPAR) 15 MG tablet Take 1 tablet (15 mg total) by mouth 2 (two) times daily. 60 tablet 3  . diphenhydrAMINE HCl, Sleep, (SLEEP AID) 25 MG CAPS Take by mouth.    . escitalopram (LEXAPRO) 10 MG tablet Take 1 tablet (10 mg total) by mouth daily. 90 tablet 3  . hydrOXYzine (ATARAX/VISTARIL) 25 MG tablet Take 25 mg by mouth 3 (three) times daily as needed.    [name] ipratropium-albuterol (DUONEB) 0.5-2.5 (3) MG/3ML SOLN INHALE VIA NEBULIZER EVERY 6 HOURS AS NEEDED 90 mL 2  . Melatonin 3 MG CAPS Take 3 mg by mouth at bedtime.    . risedronate (ACTONEL) 35 MG tablet Take 1 tablet (35 mg total) by mouth every 7 (seven) days. with water on empty stomach, nothing by mouth or lie down for next 30 minutes. 12 tablet 3  . simvastatin (ZOCOR) 5 MG tablet Take one by mouth every other day 45 tablet 3   No current facility-administered medications on file prior to visit.    Review of Systems:  As per HPI- otherwise negative.   Physical Examination: Vitals:   07/30/19 1344  BP: 122/80  Pulse: 89  Resp: 16  Temp: (!) 97.1 F (36.2 C)  SpO2: 95%   Vitals:   07/30/19 1344  Weight: 140 lb (63.5 kg)  Height: 5\' 1"  (1.549 m)   Body mass index is 26.45 kg/m. Ideal Body Weight: Weight in (lb) to have BMI = 25: 132  GEN: no acute distress.  Minimal overweight, looks well.  Her normal self HEENT: Atraumatic, Normocephalic.  Ears and Nose: No external deformity. CV: RRR, No M/G/R. No JVD. No thrill. No extra  heart sounds. PULM: CTA B, no wheezes, crackles, rhonchi. No retractions. No resp. distress. No accessory muscle use. ABD: S, NT, ND, +BS. No rebound. No HSM. EXTR: No c/c/e PSYCH: Normally interactive. Conversant.    Assessment and Plan: Generalized anxiety disorder  Hypercholesteremia - Plan: Lipid panel, simvastatin (ZOCOR) 10 MG tablet  Prediabetes - Plan: Comprehensive metabolic panel, Hemoglobin A1c  Screening for deficiency anemia - Plan: CBC  Screening for thyroid disorder - Plan: TSH  Leukocytosis,  unspecified type - Plan: CBC  Chronic obstructive pulmonary disease with acute exacerbation (HCC) - Plan: ipratropium-albuterol (DUONEB) 0.5-2.5 (3) MG/3ML SOLN  Here today for routine follow-up visit.  Patient notes that her anxiety is under okay control Refilled her simvastatin, she is tolerating 10 mg okay Routine labs pending as above Refilled DuoNeb's, she will continue to see her pulmonologist Her lung cancer screening CT is up-to-date Moderate medical decision making today This visit occurred during the SARS-CoV-2 public health emergency.  Safety protocols were in place, including screening questions prior to the visit, additional usage of staff PPE, and extensive cleaning of exam room while observing appropriate contact time as indicated for disinfecting solutions.   Moderate medical decision making today Signed Lamar Blinks, MD  3/2, received her labs as below Letter to patient as she does not have my chart set up A1c is stable  Results for orders placed or performed in visit on 07/30/19  CBC  Result Value Ref Range   WBC 7.9 4.0 - 10.5 K/uL   RBC 4.95 3.87 - 5.11 Mil/uL   Platelets 289.0 150.0 - 400.0 K/uL   Hemoglobin 15.4 (H) 12.0 - 15.0 g/dL   HCT 46.0 36.0 - 46.0 %   MCV 93.0 78.0 - 100.0 fl   MCHC 33.4 30.0 - 36.0 g/dL   RDW 14.2 11.5 - 15.5 %  Comprehensive metabolic panel  Result Value Ref Range   Sodium 140 135 - 145 mEq/L   Potassium 4.6  3.5 - 5.1 mEq/L   Chloride 106 96 - 112 mEq/L   CO2 27 19 - 32 mEq/L   Glucose, Bld 99 70 - 99 mg/dL   BUN 23 6 - 23 mg/dL   Creatinine, Ser 0.76 0.40 - 1.20 mg/dL   Total Bilirubin 0.4 0.2 - 1.2 mg/dL   Alkaline Phosphatase 61 39 - 117 U/L   AST 19 0 - 37 U/L   ALT 16 0 - 35 U/L   Total Protein 6.8 6.0 - 8.3 g/dL   Albumin 4.1 3.5 - 5.2 g/dL   GFR 75.05 >60.00 mL/min   Calcium 9.8 8.4 - 10.5 mg/dL  Hemoglobin A1c  Result Value Ref Range   Hgb A1c MFr Bld 6.0 4.6 - 6.5 %  Lipid panel  Result Value Ref Range   Cholesterol 186 0 - 200 mg/dL   Triglycerides 57.0 0.0 - 149.0 mg/dL   HDL 58.30 >39.00 mg/dL   VLDL 11.4 0.0 - 40.0 mg/dL   LDL Cholesterol 117 (H) 0 - 99 mg/dL   Total CHOL/HDL Ratio 3    NonHDL 127.93   TSH  Result Value Ref Range   TSH 2.07 0.35 - 4.50 uIU/mL

## 2019-07-29 NOTE — Patient Instructions (Addendum)
It was great to see you again, I will be in touch with your labs as soon as possible We refilled your cholesterol medication and your nebulizer fluid today- you can use neb treatments as needed for shortness of breath  We can give you 1 more dose of Pneumovax-pneumonia vaccine-at some point in the future  Please see me in about 6 months

## 2019-07-30 ENCOUNTER — Encounter: Payer: Self-pay | Admitting: Family Medicine

## 2019-07-30 ENCOUNTER — Ambulatory Visit (INDEPENDENT_AMBULATORY_CARE_PROVIDER_SITE_OTHER): Payer: Medicare Other | Admitting: Family Medicine

## 2019-07-30 ENCOUNTER — Other Ambulatory Visit: Payer: Self-pay

## 2019-07-30 VITALS — BP 122/80 | HR 89 | Temp 97.1°F | Resp 16 | Ht 61.0 in | Wt 140.0 lb

## 2019-07-30 DIAGNOSIS — F411 Generalized anxiety disorder: Secondary | ICD-10-CM | POA: Diagnosis not present

## 2019-07-30 DIAGNOSIS — J441 Chronic obstructive pulmonary disease with (acute) exacerbation: Secondary | ICD-10-CM

## 2019-07-30 DIAGNOSIS — D72829 Elevated white blood cell count, unspecified: Secondary | ICD-10-CM | POA: Diagnosis not present

## 2019-07-30 DIAGNOSIS — E7800 Pure hypercholesterolemia, unspecified: Secondary | ICD-10-CM

## 2019-07-30 DIAGNOSIS — R7303 Prediabetes: Secondary | ICD-10-CM

## 2019-07-30 DIAGNOSIS — Z1329 Encounter for screening for other suspected endocrine disorder: Secondary | ICD-10-CM | POA: Diagnosis not present

## 2019-07-30 DIAGNOSIS — Z130 Encounter for screening for diseases of the blood and blood-forming organs and certain disorders involving the immune mechanism: Secondary | ICD-10-CM

## 2019-07-30 LAB — COMPREHENSIVE METABOLIC PANEL
ALT: 16 U/L (ref 0–35)
AST: 19 U/L (ref 0–37)
Albumin: 4.1 g/dL (ref 3.5–5.2)
Alkaline Phosphatase: 61 U/L (ref 39–117)
BUN: 23 mg/dL (ref 6–23)
CO2: 27 mEq/L (ref 19–32)
Calcium: 9.8 mg/dL (ref 8.4–10.5)
Chloride: 106 mEq/L (ref 96–112)
Creatinine, Ser: 0.76 mg/dL (ref 0.40–1.20)
GFR: 75.05 mL/min (ref >60.00)
Glucose, Bld: 99 mg/dL (ref 70–99)
Potassium: 4.6 mEq/L (ref 3.5–5.1)
Sodium: 140 mEq/L (ref 135–145)
Total Bilirubin: 0.4 mg/dL (ref 0.2–1.2)
Total Protein: 6.8 g/dL (ref 6.0–8.3)

## 2019-07-30 LAB — LIPID PANEL
Cholesterol: 186 mg/dL (ref 0–200)
HDL: 58.3 mg/dL (ref >39.00)
LDL Cholesterol: 117 mg/dL — ABNORMAL HIGH (ref 0–99)
NonHDL: 127.93
Total CHOL/HDL Ratio: 3
Triglycerides: 57 mg/dL (ref 0.0–149.0)
VLDL: 11.4 mg/dL (ref 0.0–40.0)

## 2019-07-30 LAB — CBC
HCT: 46 % (ref 36.0–46.0)
Hemoglobin: 15.4 g/dL — ABNORMAL HIGH (ref 12.0–15.0)
MCHC: 33.4 g/dL (ref 30.0–36.0)
MCV: 93 fl (ref 78.0–100.0)
Platelets: 289 10*3/uL (ref 150.0–400.0)
RBC: 4.95 Mil/uL (ref 3.87–5.11)
RDW: 14.2 % (ref 11.5–15.5)
WBC: 7.9 10*3/uL (ref 4.0–10.5)

## 2019-07-30 LAB — TSH: TSH: 2.07 u[IU]/mL (ref 0.35–4.50)

## 2019-07-30 LAB — HEMOGLOBIN A1C: Hgb A1c MFr Bld: 6 % (ref 4.6–6.5)

## 2019-07-30 MED ORDER — IPRATROPIUM-ALBUTEROL 0.5-2.5 (3) MG/3ML IN SOLN: RESPIRATORY_TRACT | 2 refills | Status: DC

## 2019-07-30 MED ORDER — SIMVASTATIN 10 MG PO TABS: 10.0000 mg | ORAL_TABLET | Freq: Every day | ORAL | 3 refills | Status: DC

## 2019-08-21 ENCOUNTER — Ambulatory Visit: Payer: Medicare Other | Attending: Internal Medicine

## 2019-08-21 DIAGNOSIS — Z23 Encounter for immunization: Secondary | ICD-10-CM

## 2019-08-21 NOTE — Progress Notes (Signed)
   Covid-19 Vaccination Clinic  Name:  Tasha [name]    MRN: [id number] DOB: 04-10-1951  08/21/2019  [name] was observed post Covid-19 immunization for 15 minutes without incident. She was provided with Vaccine Information Sheet and instruction to access the V-Safe system.   [name] was instructed to call 911 with any severe reactions post vaccine: [name] Difficulty breathing  . Swelling of face and throat  . A fast heartbeat  . A bad rash all over body  . Dizziness and weakness   Immunizations Administered    Name Date Dose VIS Date Route   Pfizer COVID-19 Vaccine 08/21/2019  2:07 PM 0.3 mL 05/11/2019 Intramuscular   Manufacturer: [employer], [employer]   Lot: [id number]   NDC: [address]-9

## 2019-10-31 ENCOUNTER — Other Ambulatory Visit: Payer: Self-pay | Admitting: Family Medicine

## 2019-10-31 DIAGNOSIS — F411 Generalized anxiety disorder: Secondary | ICD-10-CM

## 2019-11-02 ENCOUNTER — Telehealth: Payer: Self-pay

## 2019-11-02 DIAGNOSIS — F411 Generalized anxiety disorder: Secondary | ICD-10-CM

## 2019-11-02 NOTE — Telephone Encounter (Signed)
Please advise, not on patients current medication list.

## 2019-11-02 NOTE — Telephone Encounter (Signed)
Patient called in to get a prescription refill for  Buspirone Hydrochloride 15mg  1 tablet twice a day quantity of 60 .  Please send it to [facility] [address] - [address]  [address] [place]  Phone:  [phone] Fax:  [phone]  DEA #:  --

## 2019-11-04 MED ORDER — BUSPIRONE HCL 15 MG PO TABS: 15.0000 mg | ORAL_TABLET | Freq: Two times a day (BID) | ORAL | 3 refills | Status: DC

## 2019-11-07 ENCOUNTER — Encounter: Payer: Self-pay | Admitting: Family

## 2019-11-07 ENCOUNTER — Ambulatory Visit (INDEPENDENT_AMBULATORY_CARE_PROVIDER_SITE_OTHER): Payer: Medicare Other | Admitting: Family

## 2019-11-07 ENCOUNTER — Other Ambulatory Visit: Payer: Self-pay

## 2019-11-07 VITALS — BP 131/76 | HR 73 | Temp 97.4°F | Resp 16 | Ht 61.0 in | Wt 137.0 lb

## 2019-11-07 DIAGNOSIS — R21 Rash and other nonspecific skin eruption: Secondary | ICD-10-CM

## 2019-11-07 MED ORDER — PREDNISONE 10 MG PO TABS: ORAL_TABLET | ORAL | 0 refills | Status: DC

## 2019-11-07 MED ORDER — BETAMETHASONE DIPROPIONATE AUG 0.05 % EX OINT: TOPICAL_OINTMENT | Freq: Two times a day (BID) | CUTANEOUS | 0 refills | Status: DC

## 2019-11-07 NOTE — Progress Notes (Signed)
Subjective:    Patient ID: Tasha [name], female    DOB: Feb 27, 1951, 68 y.o.   MRN: [id number]  HPI   Patient is a 69 yr old female who presents today with chief complaint of skin rash.  She reports that she has had history of skin rash on both elbows as well as the palms of her hands for a long time.  She has previously seen Dr. Venancio Poisson for these rashes.  However 1 week ago she developed a rash on her back.  She reports that the rash is not significantly pruritic.  Reports that rash has been present for 3 days.    Review of Systems    see HPI  Past Medical History:  Diagnosis Date  . Abnormal nuclear stress test 06/10/2015  . Allergy   . Anxiety   . Arthritis    DDD lumbar; B thumbs  . Asthma    age 71  . Clotting disorder (HCC)   . Depression   . DVT (deep venous thrombosis) (HCC)   . GERD (gastroesophageal reflux disease)   . Hyperlipidemia   . Hypertension   . Psoriasiform dermatitis      Social History   Socioeconomic History  . Marital status: Single    Spouse name: Not on file  . Number of children: Not on file  . Years of education: Not on file  . Highest education level: Not on file  Occupational History  . Not on file  Tobacco Use  . Smoking status: Current Every Day Smoker    Packs/day: 0.50    Years: 46.00    Pack years: 23.00    Types: Cigarettes  . Smokeless tobacco: Never Used  Substance and Sexual Activity  . Alcohol use: No  . Drug use: No  . Sexual activity: Not on file  Other Topics Concern  . Not on file  Social History Narrative   Marital status: single; not dating and not interested      Children: none      Lives: alone with dog      Employment:  Semi-retired; works a little bit; Air cabin crew since 1980; maintains one client      Tobacco: 1 ppd x 50 years      Alcohol: quit; IN [place] SINCE 09/25/1985.  Attends AA twice weekly; still sponsors others.      Drugs:  None      Exercise:       ADLs: independent with  ADLs; no assistant devices.      Advanced Directives:  Yes.  desires FULL CODE.   Social Determinants of Health   Financial Resource Strain:   . Difficulty of Paying Living Expenses:   Food Insecurity:   . Worried About Programme researcher, broadcasting/film/video in the Last Year:   . Barista in the Last Year:   Transportation Needs:   . Freight forwarder (Medical):   [name] Lack of Transportation (Non-Medical):   Physical Activity:   . Days of Exercise per Week:   . Minutes of Exercise per Session:   Stress:   . Feeling of Stress :   Social Connections:   . Frequency of Communication with Friends and Family:   . Frequency of Social Gatherings with Friends and Family:   . Attends Religious Services:   . Active Member of Clubs or Organizations:   . Attends Banker Meetings:   [name] Marital Status:   Intimate Partner Violence:   .  Fear of Current or Ex-Partner:   . Emotionally Abused:   [name] Physically Abused:   . Sexually Abused:     Past Surgical History:  Procedure Laterality Date  . NOSE SURGERY    . TONSILLECTOMY    . TUBAL LIGATION      Family History  Problem Relation Age of Onset  . Cancer Mother 22       colorectal cancer  . Stroke Mother 70       CVA  . Alcohol abuse Mother   . Diabetes Mother   . Hypertension Mother   . Heart disease Father   . Cancer Maternal Grandfather        lung  . Alcohol abuse Brother   . Heart attack Brother   . Cancer Maternal Aunt        breast  . Breast cancer Maternal Aunt 75    Allergies  Allergen Reactions  . Acitretin Swelling and Rash  . Pollen Extract Shortness Of Breath    Leaves, pt. reports  . Percodan [Oxycodone-Aspirin]     Pt does not remember  . Statins Other (See Comments)    Muscle aches in legs     Current Outpatient Medications on File Prior to Visit  Medication Sig Dispense Refill  . aspirin 325 MG tablet Take 325 mg by mouth daily.    . busPIRone (BUSPAR) 15 MG tablet Take 1 tablet (15 mg total) by  mouth 2 (two) times daily. 180 tablet 3  . Cholecalciferol (VITAMIN D3) 50 MCG (2000 UT) TABS Take by mouth.    . Coenzyme Q10 (COQ10 PO) Take by mouth.    . diphenhydrAMINE HCl, Sleep, (SLEEP AID) 25 MG CAPS Take by mouth.    . escitalopram (LEXAPRO) 10 MG tablet Take 1 tablet (10 mg total) by mouth daily. 90 tablet 3  . ipratropium-albuterol (DUONEB) 0.5-2.5 (3) MG/3ML SOLN INHALE VIA NEBULIZER EVERY 6 HOURS AS NEEDED 90 mL 2  . Melatonin 3 MG CAPS Take 3 mg by mouth at bedtime.    . Omega-3 Fatty Acids (SALMON OIL PO) Take by mouth.    . risedronate (ACTONEL) 35 MG tablet Take 1 tablet (35 mg total) by mouth every 7 (seven) days. with water on empty stomach, nothing by mouth or lie down for next 30 minutes. 12 tablet 3  . simvastatin (ZOCOR) 10 MG tablet Take 1 tablet (10 mg total) by mouth daily. 90 tablet 3  . vitamin E 1000 UNIT capsule Take 1,000 Units by mouth daily.     No current facility-administered medications on file prior to visit.    BP 131/76 (BP Location: Right Arm, Patient Position: Sitting, Cuff Size: Small)   Pulse 73   Temp (!) 97.4 F (36.3 C) (Temporal)   Resp 16   Ht 5\' 1"  (1.549 m)   Wt 137 lb (62.1 kg)   SpO2 93%   BMI 25.89 kg/m    Objective:   Physical Exam Constitutional:      Appearance: Normal appearance.  Pulmonary:     Effort: Pulmonary effort is normal.  Skin:    Comments: Eczematous rash noted on bilateral elbows  Dry peeling eczematous rash noted on bilateral palmar surfaces.  Erythematous fine macular rash noted on back.  Neurological:     Mental Status: She is alert.              Assessment & Plan:  Skin rash-the eczematous rash on her palmar surfaces and elbows is chronic.  For this rash I recommended the following:  Apply a good emollient such as Eucerin or CeraVe once daily to affected areas.  She may use betamethasone ointment twice daily as needed.  In addition I will arrange follow-up with her dermatologist.  She  is advised to call if increased redness erythema or drainage occurs.  For the rash on her back I have provided her with a short course of prednisone taper.  This may also help some with the eczema rash.  This visit occurred during the SARS-CoV-2 public health emergency.  Safety protocols were in place, including screening questions prior to the visit, additional usage of staff PPE, and extensive cleaning of exam room while observing appropriate contact time as indicated for disinfecting solutions.

## 2019-11-07 NOTE — Patient Instructions (Signed)
Please begin prednisone taper for skin rash. After you complete prednisone taper.  Use a good ointment twice daily such as Eucerin, Cereve, Aveeno- unscented.   You should be contacted about your appointment with dermatology.  Call if symptoms worsen or if symptoms fail to improve.

## 2019-12-07 ENCOUNTER — Telehealth: Payer: Self-pay

## 2019-12-07 NOTE — Telephone Encounter (Signed)
error 

## 2020-01-02 ENCOUNTER — Other Ambulatory Visit: Payer: Self-pay | Admitting: Family Medicine

## 2020-01-02 DIAGNOSIS — F411 Generalized anxiety disorder: Secondary | ICD-10-CM

## 2020-02-11 ENCOUNTER — Ambulatory Visit: Payer: Self-pay | Admitting: *Deleted

## 2020-05-08 DIAGNOSIS — Z23 Encounter for immunization: Secondary | ICD-10-CM | POA: Diagnosis not present

## 2020-06-25 IMAGING — DX DG LUMBAR SPINE COMPLETE 4+V
5 series · 5 of 5 positions shown · non-contrast
Comparison: None.

CLINICAL DATA: Midline low back pain for the past 10 days with no
known injury. No radicular symptoms. History of scoliosis.

EXAM:
LUMBAR SPINE - COMPLETE 4+ VIEW

[l-spine ap]
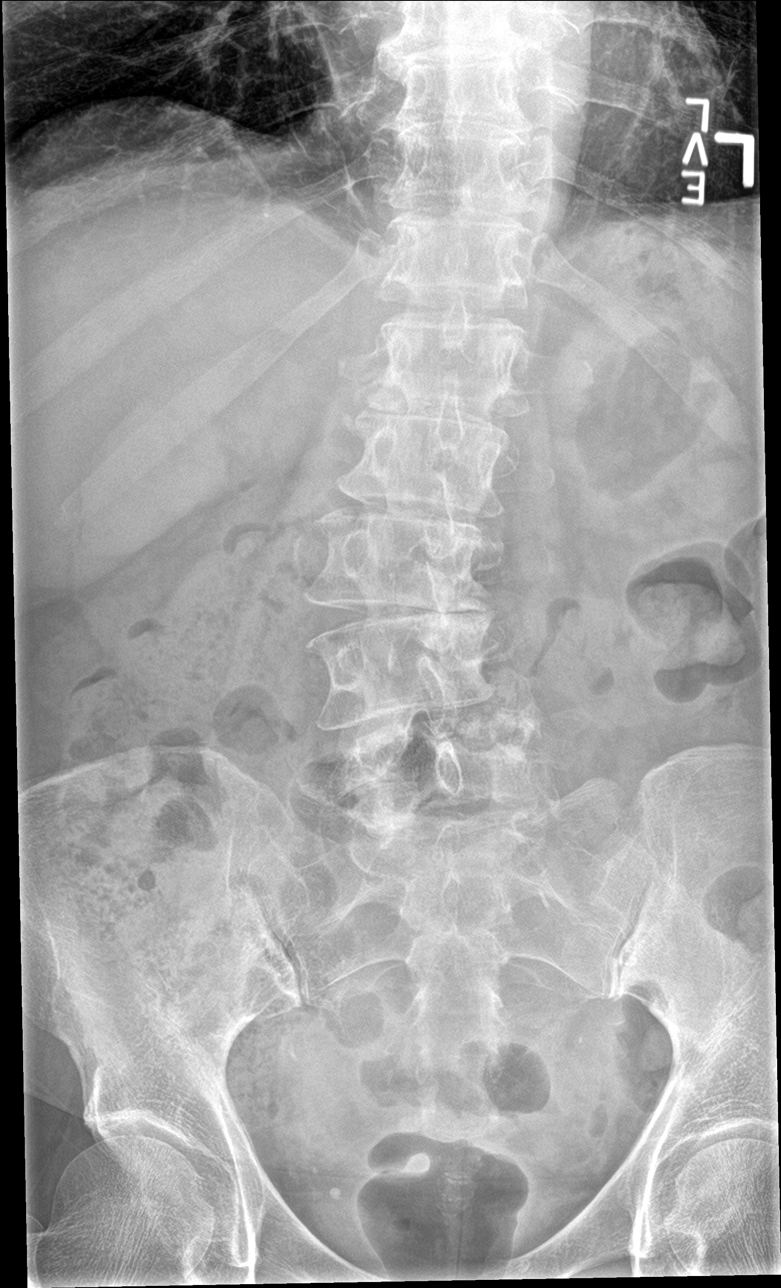

[l-spine obl (1 of 2)]
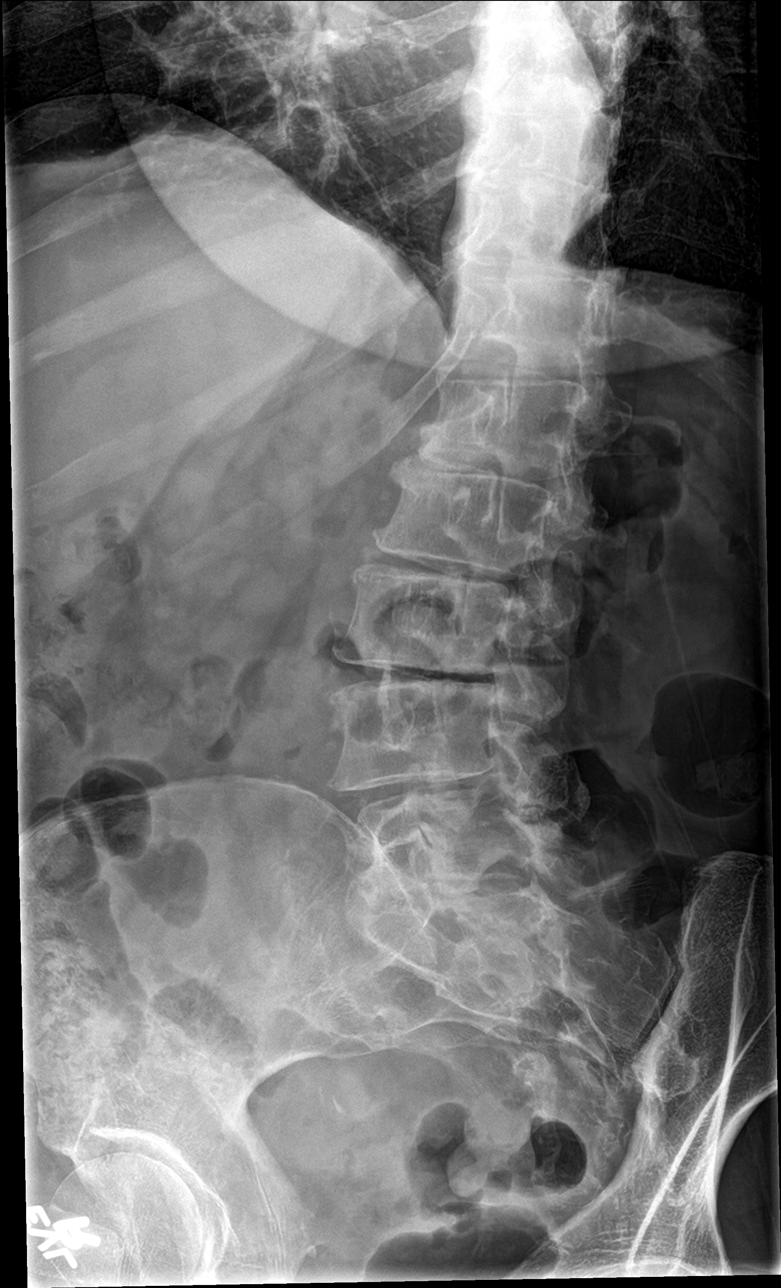

[l-spine obl (2 of 2)]
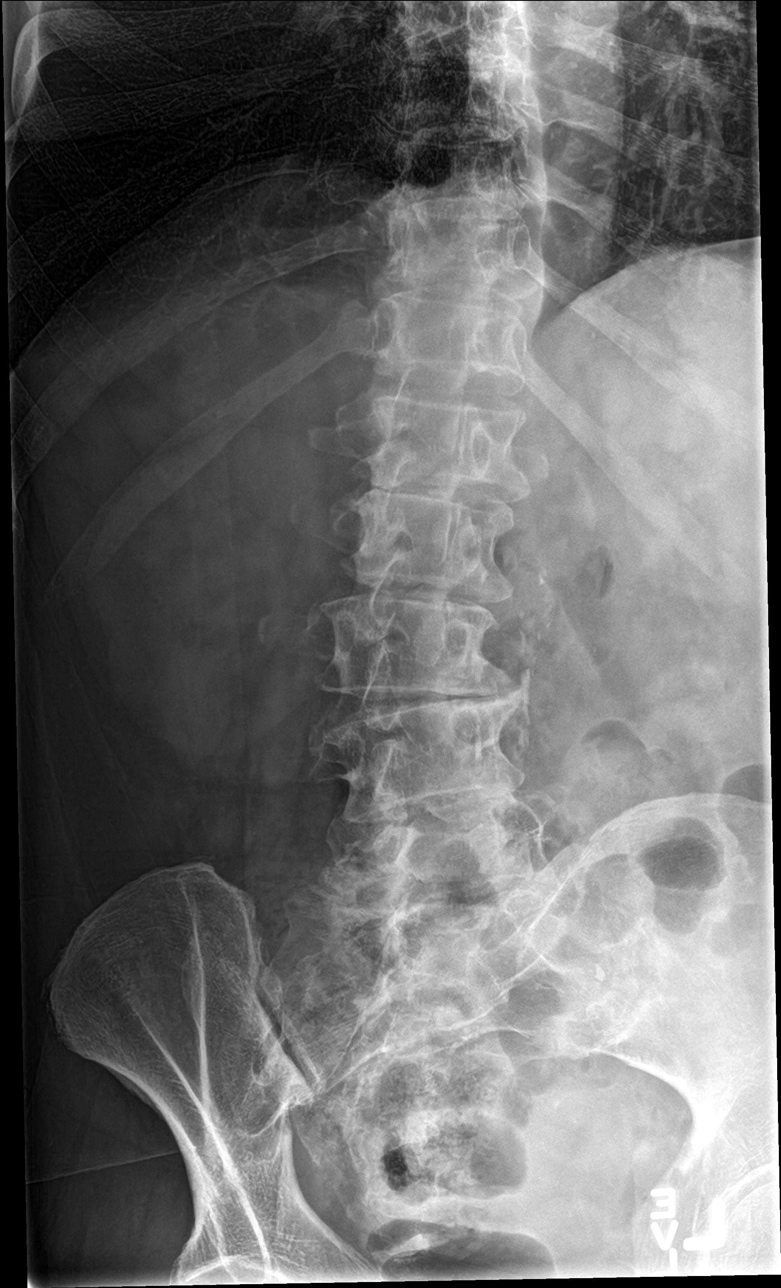

[l-spine lat]
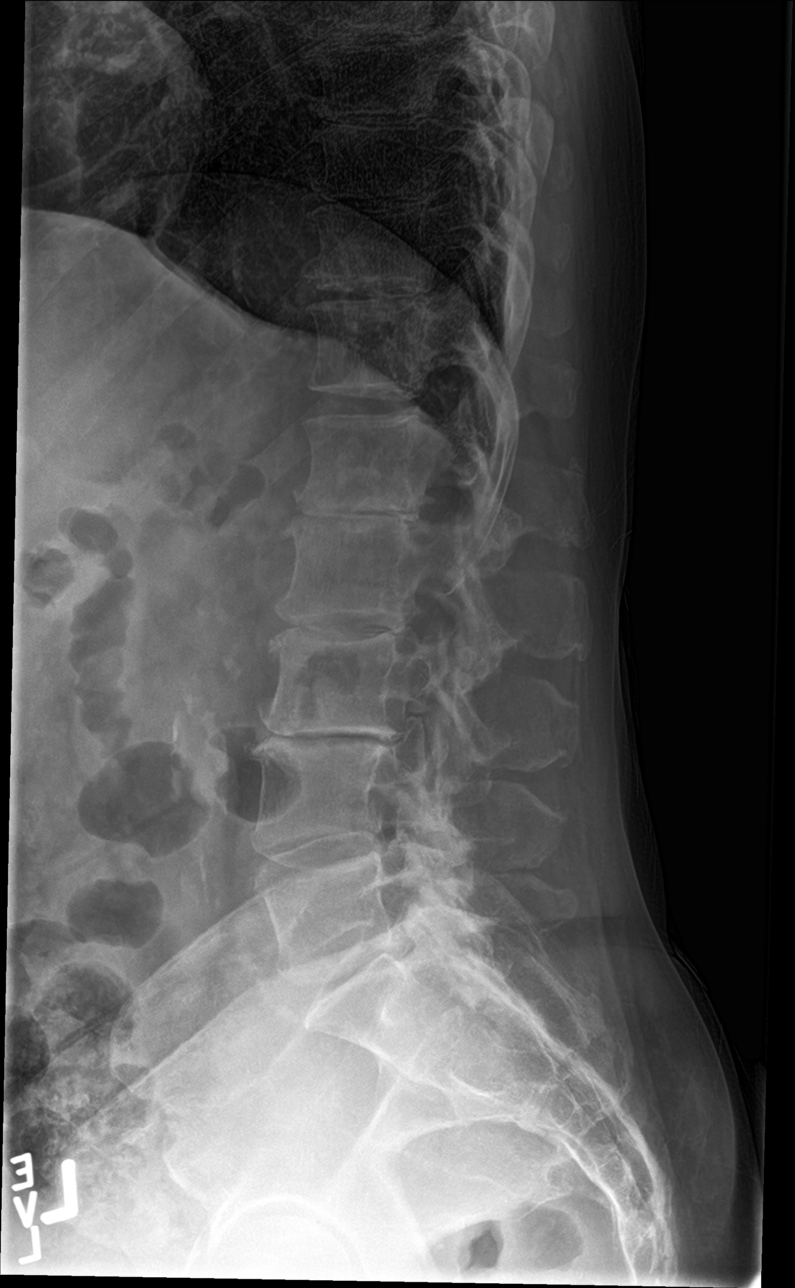

[l-spine spot]
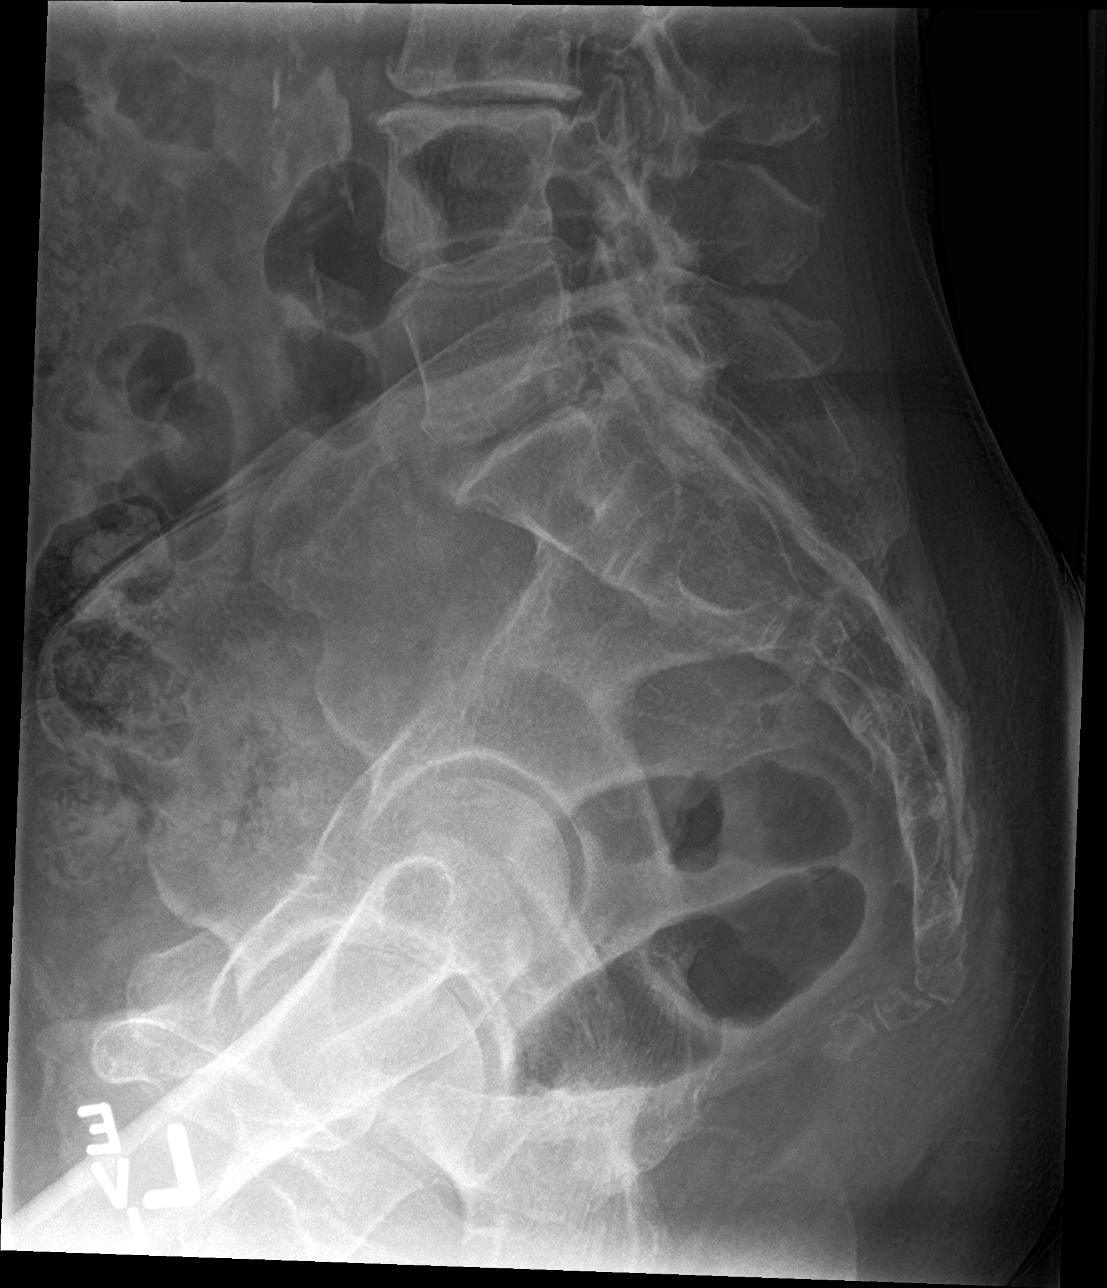

[5 of 5 positions shown; findings below may reference images not displayed]

FINDINGS: There is moderate curvature convex toward the right centered at
L3-4. The pedicles and transverse processes are grossly normal where
visualized. There is moderate disc space narrowing at L1-2 and L3-4
with slightly milder narrowing at L2-3 and L4-5 and L5-S1. There is
no spondylolisthesis. There is facet joint hypertrophy at L4-5 and
at L5-S1. Mild subluxation of L5 to the left as compared L4 is
present.
IMPRESSION: Multilevel degenerative disc and facet joint changes. No compression
fracture. Mild subluxation to the left of L5 with respect to L4.

## 2020-06-27 DIAGNOSIS — L918 Other hypertrophic disorders of the skin: Secondary | ICD-10-CM | POA: Diagnosis not present

## 2020-06-27 DIAGNOSIS — L308 Other specified dermatitis: Secondary | ICD-10-CM | POA: Diagnosis not present

## 2020-06-27 DIAGNOSIS — L2089 Other atopic dermatitis: Secondary | ICD-10-CM | POA: Diagnosis not present

## 2020-07-21 ENCOUNTER — Other Ambulatory Visit: Payer: Self-pay | Admitting: Family Medicine

## 2020-07-21 DIAGNOSIS — M810 Age-related osteoporosis without current pathological fracture: Secondary | ICD-10-CM

## 2020-08-30 ENCOUNTER — Other Ambulatory Visit: Payer: Self-pay | Admitting: Family Medicine

## 2020-08-30 DIAGNOSIS — E7800 Pure hypercholesterolemia, unspecified: Secondary | ICD-10-CM

## 2020-10-08 ENCOUNTER — Encounter: Payer: Self-pay | Admitting: Family Medicine

## 2020-10-08 ENCOUNTER — Ambulatory Visit (INDEPENDENT_AMBULATORY_CARE_PROVIDER_SITE_OTHER): Payer: Medicare Other | Admitting: Family Medicine

## 2020-10-08 ENCOUNTER — Other Ambulatory Visit: Payer: Self-pay

## 2020-10-08 VITALS — BP 124/70 | HR 97 | Temp 98.1°F | Resp 22 | Ht 61.0 in | Wt 133.0 lb

## 2020-10-08 DIAGNOSIS — M810 Age-related osteoporosis without current pathological fracture: Secondary | ICD-10-CM | POA: Diagnosis not present

## 2020-10-08 DIAGNOSIS — Z5181 Encounter for therapeutic drug level monitoring: Secondary | ICD-10-CM | POA: Diagnosis not present

## 2020-10-08 DIAGNOSIS — Z130 Encounter for screening for diseases of the blood and blood-forming organs and certain disorders involving the immune mechanism: Secondary | ICD-10-CM

## 2020-10-08 DIAGNOSIS — R7303 Prediabetes: Secondary | ICD-10-CM | POA: Diagnosis not present

## 2020-10-08 DIAGNOSIS — L2082 Flexural eczema: Secondary | ICD-10-CM | POA: Diagnosis not present

## 2020-10-08 DIAGNOSIS — F411 Generalized anxiety disorder: Secondary | ICD-10-CM | POA: Diagnosis not present

## 2020-10-08 DIAGNOSIS — Z720 Tobacco use: Secondary | ICD-10-CM | POA: Diagnosis not present

## 2020-10-08 DIAGNOSIS — Z122 Encounter for screening for malignant neoplasm of respiratory organs: Secondary | ICD-10-CM | POA: Diagnosis not present

## 2020-10-08 DIAGNOSIS — R5383 Other fatigue: Secondary | ICD-10-CM

## 2020-10-08 DIAGNOSIS — Z1231 Encounter for screening mammogram for malignant neoplasm of breast: Secondary | ICD-10-CM | POA: Diagnosis not present

## 2020-10-08 DIAGNOSIS — E7800 Pure hypercholesterolemia, unspecified: Secondary | ICD-10-CM

## 2020-10-08 MED ORDER — RISEDRONATE SODIUM 35 MG PO TABS: ORAL_TABLET | ORAL | 12 refills | Status: DC

## 2020-10-08 MED ORDER — SIMVASTATIN 10 MG PO TABS: 10.0000 mg | ORAL_TABLET | Freq: Every day | ORAL | 12 refills | Status: DC

## 2020-10-08 MED ORDER — TRIAMCINOLONE ACETONIDE 0.1 % EX CREA: 1.0000 "application " | TOPICAL_CREAM | Freq: Two times a day (BID) | CUTANEOUS | 1 refills | Status: DC

## 2020-10-08 MED ORDER — BUSPIRONE HCL 15 MG PO TABS: 15.0000 mg | ORAL_TABLET | Freq: Two times a day (BID) | ORAL | 3 refills | Status: DC

## 2020-10-08 MED ORDER — ESCITALOPRAM OXALATE 10 MG PO TABS: 10.0000 mg | ORAL_TABLET | Freq: Every day | ORAL | 11 refills | Status: AC

## 2020-10-08 NOTE — Progress Notes (Addendum)
[facility] [address], [address] [place], [place] [address] [phone]  Date:  10/08/2020   Name:  Tasha [name]   DOB:  02/05/1951   MRN:  [id number]  PCP:  Pearline Cables, MD    Chief Complaint: Medication Refill   History of Present Illness:  Tasha [name] is a 70 y.o. very pleasant female patient who presents with the following:  Here today for a follow-up visit and labs, Patient with history of COPD, hypertension, hyperlipidemia, smoking, anxiety, psoriasiform dermatitis, prediabetes Last seen by myself 3/21  She had 3 doses of covid - we discussed and I encouraged the 4th dose  Offered a lung cancer screening- she would like me to order this test for her  She is 35 years sober and we celebrated her sobriety today   Will do labs today for routine eval   She has some eczema on her arms and is using moisturizer as needed   Patient Active Problem List   Diagnosis Date Noted  . COPD with acute exacerbation (HCC) 12/10/2017  . Acute on chronic respiratory failure with hypoxia (HCC) 12/10/2017  . Generalized anxiety disorder 12/09/2017  . COPD, group C, by GOLD 2017 classification (HCC) 08/24/2016  . Nut allergy 09/03/2015  . Abnormal nuclear stress test 06/10/2015  . Family history of colorectal cancer 05/27/2015  . Tobacco abuse 05/27/2015  . 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) 04/28/2015  . Essential hypertension 04/24/2014  . DVT (deep venous thrombosis) (HCC) 03/10/2012  . 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 Pre-diabetes 02/10/2009  . ENDOMETRIAL POLYP 10/01/2008  . Hypercholesteremia 04/17/2008  . DEPRESSION 04/17/2008  . ASTHMA 04/17/2008  . SCOLIOSIS 04/17/2008  . COLONIC POLYPS, HX OF 04/17/2008    Past Medical History:  Diagnosis Date  . Abnormal nuclear stress test 06/10/2015  . Allergy   . Anxiety   . Arthritis    DDD lumbar; B thumbs  . Asthma    age 26  . Clotting disorder  (HCC)   . Depression   . DVT (deep venous thrombosis) (HCC)   . GERD (gastroesophageal reflux disease)   . Hyperlipidemia   . Hypertension   . Psoriasiform dermatitis     Past Surgical History:  Procedure Laterality Date  . NOSE SURGERY    . TONSILLECTOMY    . TUBAL LIGATION      Social History   Tobacco Use  . Smoking status: Current Every Day Smoker    Packs/day: 0.50    Years: 46.00    Pack years: 23.00    Types: Cigarettes  . Smokeless tobacco: Never Used  Vaping Use  . Vaping Use: Never used  Substance Use Topics  . Alcohol use: No  . Drug use: No    Family History  Problem Relation Age of Onset  . Cancer Mother 61       colorectal cancer  . Stroke Mother 57       CVA  . Alcohol abuse Mother   . Diabetes Mother   . Hypertension Mother   . Heart disease Father   . Cancer Maternal Grandfather        lung  . Alcohol abuse Brother   . Heart attack Brother   . Cancer Maternal Aunt        breast  . Breast cancer Maternal Aunt 75    Allergies  Allergen Reactions  . Acitretin Swelling and Rash  . Pollen Extract Shortness Of  Breath    Leaves, pt. reports  . Percodan [Oxycodone-Aspirin]     Pt does not remember  . Statins Other (See Comments)    Muscle aches in legs     Medication list has been reviewed and updated.  Current Outpatient Medications on File Prior to Visit  Medication Sig Dispense Refill  . Cholecalciferol (VITAMIN D3) 50 MCG (2000 UT) TABS Take by mouth.    . Coenzyme Q10 (COQ10 PO) Take by mouth.    . diphenhydrAMINE HCl, Sleep, (SLEEP AID) 25 MG CAPS Take by mouth.    [name] ipratropium-albuterol (DUONEB) 0.5-2.5 (3) MG/3ML SOLN INHALE VIA NEBULIZER EVERY 6 HOURS AS NEEDED 90 mL 2  . Melatonin 3 MG CAPS Take 3 mg by mouth at bedtime.    . Omega-3 Fatty Acids (SALMON OIL PO) Take by mouth.    . vitamin E 1000 UNIT capsule Take 1,000 Units by mouth daily.     No current facility-administered medications on file prior to visit.     Review of Systems:  As per HPI- otherwise negative.   Physical Examination: Vitals:   10/08/20 1534  BP: 124/70  Pulse: 97  Resp: (!) 22  Temp: 98.1 F (36.7 C)  SpO2: 93%   Vitals:   10/08/20 1534  Weight: 133 lb (60.3 kg)  Height: 5\' 1"  (1.549 m)   Body mass index is 25.13 kg/m. Ideal Body Weight: Weight in (lb) to have BMI = 25: 132  GEN: no acute distress. Normal weight, looks well  HEENT: Atraumatic, Normocephalic.  Ears and Nose: No external deformity. CV: RRR, No M/G/R. No JVD. No thrill. No extra heart sounds. PULM: CTA B, no wheezes, crackles, rhonchi. No retractions. No resp. distress. No accessory muscle use. ABD: S, NT, ND, +BS. No rebound. No HSM. EXTR: No c/c/e PSYCH: Normally interactive. Conversant.  Eczema on forearms   Assessment and Plan: Encounter for screening mammogram for malignant neoplasm of breast - Plan: Comprehensive metabolic panel, CANCELED: MM 3D SCREEN BREAST BILATERAL  Generalized anxiety disorder - Plan: escitalopram (LEXAPRO) 10 MG tablet, TSH, DISCONTINUED: busPIRone (BUSPAR) 15 MG tablet  Hypercholesteremia - Plan: simvastatin (ZOCOR) 10 MG tablet, Lipid panel  Age-related osteoporosis without current pathological fracture - Plan: risedronate (ACTONEL) 35 MG tablet  Prediabetes - Plan: Hemoglobin A1c  Screening for deficiency anemia - Plan: CBC  Flexural eczema - Plan: triamcinolone cream (KENALOG) 0.1 %  Fatigue, unspecified type - Plan: CBC, TSH  Medication monitoring encounter - Plan: CBC, Comprehensive metabolic panel  Tobacco abuse  Screening for lung cancer  Following up today Refilled medications BP under good control- refilled meds Doing well on current dose of lexapro Tried to order CT lung cancer screening - however cannot sign order due to medicare non- coverage.  I have reached out to referrals to troubleshoot Will plan further follow- up pending labs.  This visit occurred during the SARS-CoV-2  public health emergency.  Safety protocols were in place, including screening questions prior to the visit, additional usage of staff PPE, and extensive cleaning of exam room while observing appropriate contact time as indicated for disinfecting solutions.      Signed , MD   Received message from referrals, medicare does not cover CT lung. Will advise pt with her labs- she still might want to do this out of pocket   addnd 5/12- received labs as below, letter to pt  Results for orders placed or performed in visit on 10/08/20  CBC  Result Value Ref  Range   WBC 8.8 4.0 - 10.5 K/uL   RBC 5.00 3.87 - 5.11 Mil/uL   Platelets 289.0 150.0 - 400.0 K/uL   Hemoglobin 15.6 (H) 12.0 - 15.0 g/dL   HCT 16.1 (H) 09.6 - 04.5 %   MCV 93.7 78.0 - 100.0 fl   MCHC 33.4 30.0 - 36.0 g/dL   RDW 40.9 81.1 - 91.4 %  Comprehensive metabolic panel  Result Value Ref Range   Sodium 142 135 - 145 mEq/L   Potassium 3.9 3.5 - 5.1 mEq/L   Chloride 108 96 - 112 mEq/L   CO2 26 19 - 32 mEq/L   Glucose, Bld 99 70 - 99 mg/dL   BUN 30 (H) 6 - 23 mg/dL   Creatinine, Ser 7.82 0.40 - 1.20 mg/dL   Total Bilirubin 0.4 0.2 - 1.2 mg/dL   Alkaline Phosphatase 79 39 - 117 U/L   AST 20 0 - 37 U/L   ALT 25 0 - 35 U/L   Total Protein 7.0 6.0 - 8.3 g/dL   Albumin 4.4 3.5 - 5.2 g/dL   GFR 95.62 >13.08 mL/min   Calcium 9.7 8.4 - 10.5 mg/dL  Hemoglobin M5H  Result Value Ref Range   Hgb A1c MFr Bld 6.1 4.6 - 6.5 %  Lipid panel  Result Value Ref Range   Cholesterol 190 0 - 200 mg/dL   Triglycerides 84.6 0.0 - 149.0 mg/dL   HDL 96.29 >52.84 mg/dL   VLDL 13.2 0.0 - 44.0 mg/dL   LDL Cholesterol 102 (H) 0 - 99 mg/dL   Total CHOL/HDL Ratio 3    NonHDL 117.05   TSH  Result Value Ref Range   TSH 1.62 0.35 - 4.50 uIU/mL    The 10-year ASCVD risk score Denman George DC Jr., et al., 2013) is: 16.2%   Values used to calculate the score:     Age: 62 years     Sex: Female     Is Non-Hispanic African American: No      Diabetic: No     Tobacco smoker: Yes     Systolic Blood Pressure: 124 mmHg     Is BP treated: No     HDL Cholesterol: 73 mg/dL     Total Cholesterol: 190 mg/dL

## 2020-10-08 NOTE — Patient Instructions (Addendum)
It was great to see you again today- take care and I will be in touch with your results asap  We will also set you up for a mammogram and lung cancer screening CT  You can get your 4th dose of covid 19 vaccine at your convenience  You might also consider getting the shingles vaccine - shingrix- at your pharmacy at your convenience

## 2020-10-09 LAB — COMPREHENSIVE METABOLIC PANEL
ALT: 25 U/L (ref 0–35)
AST: 20 U/L (ref 0–37)
Albumin: 4.4 g/dL (ref 3.5–5.2)
Alkaline Phosphatase: 79 U/L (ref 39–117)
BUN: 30 mg/dL — ABNORMAL HIGH (ref 6–23)
CO2: 26 mEq/L (ref 19–32)
Calcium: 9.7 mg/dL (ref 8.4–10.5)
Chloride: 108 mEq/L (ref 96–112)
Creatinine, Ser: 0.79 mg/dL (ref 0.40–1.20)
GFR: 74.91 mL/min (ref >60.00)
Glucose, Bld: 99 mg/dL (ref 70–99)
Potassium: 3.9 mEq/L (ref 3.5–5.1)
Sodium: 142 mEq/L (ref 135–145)
Total Bilirubin: 0.4 mg/dL (ref 0.2–1.2)
Total Protein: 7 g/dL (ref 6.0–8.3)

## 2020-10-09 LAB — CBC
HCT: 46.9 % — ABNORMAL HIGH (ref 36.0–46.0)
Hemoglobin: 15.6 g/dL — ABNORMAL HIGH (ref 12.0–15.0)
MCHC: 33.4 g/dL (ref 30.0–36.0)
MCV: 93.7 fl (ref 78.0–100.0)
Platelets: 289 10*3/uL (ref 150.0–400.0)
RBC: 5 Mil/uL (ref 3.87–5.11)
RDW: 13.6 % (ref 11.5–15.5)
WBC: 8.8 10*3/uL (ref 4.0–10.5)

## 2020-10-09 LAB — LIPID PANEL
Cholesterol: 190 mg/dL (ref 0–200)
HDL: 73 mg/dL (ref >39.00)
LDL Cholesterol: 100 mg/dL — ABNORMAL HIGH (ref 0–99)
NonHDL: 117.05
Total CHOL/HDL Ratio: 3
Triglycerides: 84 mg/dL (ref 0.0–149.0)
VLDL: 16.8 mg/dL (ref 0.0–40.0)

## 2020-10-09 LAB — HEMOGLOBIN A1C: Hgb A1c MFr Bld: 6.1 % (ref 4.6–6.5)

## 2020-10-09 LAB — TSH: TSH: 1.62 u[IU]/mL (ref 0.35–4.50)

## 2020-12-05 ENCOUNTER — Telehealth: Payer: Self-pay | Admitting: Family Medicine

## 2020-12-05 NOTE — Telephone Encounter (Signed)
Copied from CRM [id number]. Topic: Medicare AWV >> Dec 05, 2020 11:24 AM Harris-Coley, Avon Gully wrote: Reason for CRM: Left message for patient to schedule Annual Wellness Visit.  Please schedule with Nurse Health Advisor Lanier Ensign, RN at [facility].

## 2020-12-09 NOTE — Progress Notes (Signed)
Subjective:   Tasha CornfieldJanet Paisley [name] is a 70 y.o. female who presents for Medicare Annual (Subsequent) preventive examination.  Review of Systems     Cardiac Risk Factors include: advanced age (>2255men, 57>65 women);dyslipidemia;hypertension;smoking/ tobacco exposure;sedentary lifestyle     Objective:    Today's Vitals   12/10/20 1127  BP: 130/90  Pulse: 85  Resp: 18  Temp: (!) 97.2 F (36.2 C)  TempSrc: Temporal  SpO2: 95%  Weight: 129 lb (58.5 kg)  Height: 5\' 1"  (1.549 m)   Body mass index is 24.37 kg/m.  Advanced Directives 12/10/2020 02/08/2019 12/11/2017 05/27/2015  Does Patient Have a Medical Advance Directive? Yes No Yes Yes  Type of Estate agentAdvance Directive Healthcare Power of [place];Living will - Healthcare Power of Attorney Living will  Does patient want to make changes to medical advance directive? - - No - Patient declined No - Patient declined  Copy of Healthcare Power of Attorney in Chart? No - copy requested - No - copy requested No - copy requested  Would patient like information on creating a medical advance directive? - No - Patient declined - -    Current Medications (verified) Outpatient Encounter Medications as of 12/10/2020  Medication Sig   Cholecalciferol (VITAMIN D3) 50 MCG (2000 UT) TABS Take by mouth.   Coenzyme Q10 (COQ10 PO) Take by mouth.   diphenhydrAMINE HCl, Sleep, (SLEEP AID) 25 MG CAPS Take by mouth.   escitalopram (LEXAPRO) 10 MG tablet Take 1 tablet (10 mg total) by mouth daily.   ipratropium-albuterol (DUONEB) 0.5-2.5 (3) MG/3ML SOLN INHALE 3MLS VIA NEBULIZER EVERY 6 HOURS AS NEEDED   Melatonin 3 MG CAPS Take 3 mg by mouth at bedtime.   Omega-3 Fatty Acids (SALMON OIL PO) Take by mouth.   risedronate (ACTONEL) 35 MG tablet TAKE ONE TABLET BY MOUTH WITH WATER OM AN EMPTY STOMACH EVERY 7 DAYS. NOTHING BY MOUTH OR LIE DOWN FOR NEXT 30 MINS   simvastatin (ZOCOR) 10 MG tablet Take 1 tablet (10 mg total) by mouth daily.   triamcinolone cream  (KENALOG) 0.1 % Apply 1 application topically 2 (two) times daily.   vitamin E 1000 UNIT capsule Take 1,000 Units by mouth daily.   No facility-administered encounter medications on file as of 12/10/2020.    Allergies (verified) Acitretin, Pollen extract, Percodan [oxycodone-aspirin], and Statins   History: Past Medical History:  Diagnosis Date   Abnormal nuclear stress test 06/10/2015   Allergy    Anxiety    Arthritis    DDD lumbar; B thumbs   Asthma    age 70   Clotting disorder (HCC)    Depression    DVT (deep venous thrombosis) (HCC)    GERD (gastroesophageal reflux disease)    Hyperlipidemia    Hypertension    Psoriasiform dermatitis    Past Surgical History:  Procedure Laterality Date   NOSE SURGERY     TONSILLECTOMY     TUBAL LIGATION     Family History  Problem Relation Age of Onset   Cancer Mother [id number]       colorectal cancer   Stroke Mother [id number]       CVA   Alcohol abuse Mother    Diabetes Mother    Hypertension Mother    Heart disease Father    Cancer Maternal Grandfather        lung   Alcohol abuse Brother    Heart attack Brother    Cancer Maternal Aunt        breast  Breast cancer Maternal Aunt 81   Social History   Socioeconomic History   Marital status: Single    Spouse name: Not on file   Number of children: Not on file   Years of education: Not on file   Highest education level: Not on file  Occupational History   Not on file  Tobacco Use   Smoking status: Every Day    Packs/day: 0.50    Years: 46.00    Pack years: 23.00    Types: Cigarettes   Smokeless tobacco: Never  Vaping Use   Vaping Use: Never used  Substance and Sexual Activity   Alcohol use: No   Drug use: No   Sexual activity: Not on file  Other Topics Concern   Not on file  Social History Narrative   Marital status: single; not dating and not interested      Children: none      Lives: alone with dog      Employment:  Semi-retired; works a little bit; Armed forces operational officer since 1980; maintains one client      Tobacco: 1 ppd x 50 years      Alcohol: quit; IN [place] SINCE 09/25/1985.  Attends AA twice weekly; still sponsors others.      Drugs:  None      Exercise:       ADLs: independent with ADLs; no assistant devices.      Advanced Directives:  Yes.  desires FULL CODE.   Social Determinants of Health   Financial Resource Strain: Low Risk    Difficulty of Paying Living Expenses: Not hard at all  Food Insecurity: No Food Insecurity   Worried About Programme researcher, broadcasting/film/video in the Last Year: Never true   Ran Out of Food in the Last Year: Never true  Transportation Needs: No Transportation Needs   Lack of Transportation (Medical): No   Lack of Transportation (Non-Medical): No  Physical Activity: Inactive   Days of Exercise per Week: 0 days   Minutes of Exercise per Session: 0 min  Stress: No Stress Concern Present   Feeling of Stress : Not at all  Social Connections: Moderately Isolated   Frequency of Communication with Friends and Family: More than three times a week   Frequency of Social Gatherings with Friends and Family: Once a week   Attends Religious Services: Never   Database administrator or Organizations: Yes   Attends Engineer, structural: More than 4 times per year   Marital Status: Never married    Tobacco Counseling Ready to quit: Not Answered Counseling given: Not Answered   Clinical Intake:  Pre-visit preparation completed: Yes  Pain : No/denies pain     Nutritional Status: BMI of 19-24  Normal Nutritional Risks: None Diabetes: No  How often do you need to have someone help you when you read instructions, pamphlets, or other written materials from your doctor or pharmacy?: 1 - Never  Diabetic?No  Interpreter Needed?: No  Information entered by :: Thomasenia Sales LPN   Activities of Daily Living In your present state of health, do you have any difficulty performing the following activities: 12/10/2020   Hearing? N  Vision? N  Difficulty concentrating or making decisions? N  Walking or climbing stairs? N  Dressing or bathing? N  Doing errands, shopping? N  Preparing Food and eating ? N  Using the Toilet? N  In the past six months, have you accidently leaked urine? N  Do you have  problems with loss of bowel control? N  Managing your Medications? N  Managing your Finances? N  Housekeeping or managing your Housekeeping? N  Some recent data might be hidden    Patient Care Team: Copland, Gwenlyn Found, MD as PCP - General (Family Medicine) [name], MD (Family Medicine)  Indicate any recent Medical Services you may have received from other than [facility] providers in the past year (date may be approximate).     Assessment:   This is a routine wellness examination for Tasha [name].  Hearing/Vision screen Hearing Screening - Comments:: No issues Vision Screening - Comments:: Wars glasses Last eye exam-several years ago  Dietary issues and exercise activities discussed: Current Exercise Habits: The patient does not participate in regular exercise at present, Exercise limited by: None identified   Goals Addressed             This Visit's Progress    Patient Stated       Maintain current health        Depression Screen PHQ 2/9 Scores 12/10/2020 10/08/2020 02/08/2019 11/29/2017 11/28/2017 12/05/2015 10/04/2015  PHQ - 2 Score 0 0 0 0 0 0 0    Fall Risk Fall Risk  12/10/2020 10/08/2020 02/08/2019 12/27/2018 11/28/2017  Falls in the past year? 0 0 0 (No Data) No  Comment - - - Emmi Telephone Survey: data to providers prior to load -  Number falls in past yr: 0 - - (No Data) -  Comment - - - Emmi Telephone Survey Actual Response =  -  Injury with Fall? 0 - - - -  Follow up Falls prevention discussed - - - -    FALL RISK PREVENTION PERTAINING TO THE HOME:  Any stairs in or around the home? Yes  If so, are there any without handrails? No  Home free of loose throw rugs in walkways, pet beds,  electrical cords, etc? Yes  Adequate lighting in your home to reduce risk of falls? Yes   ASSISTIVE DEVICES UTILIZED TO PREVENT FALLS:  Life alert? No  Use of a cane, walker or w/c? No  Grab bars in the bathroom? Yes  Shower chair or bench in shower? No  Elevated toilet seat or a handicapped toilet? No   TIMED UP AND GO:  Was the test performed? Yes .  Length of time to ambulate 10 feet: 10 sec.   Gait steady and fast without use of assistive device  Cognitive Function:Normal cognitive status assessed by direct observation by this Nurse Health Advisor. No abnormalities found.       6CIT Screen 12/10/2020  What Year? 0 points  What month? 0 points  What time? 0 points  Count back from 20 0 points  Months in reverse 0 points  Repeat phrase 0 points  Total Score 0    Immunizations Immunization History  Administered Date(s) Administered   Fluad Quad(high Dose 65+) 03/10/2019   Influenza Split 04/10/2012   Influenza Whole 04/17/2008, 04/29/2009   Influenza, High Dose Seasonal PF 04/08/2016, 03/31/2017, 02/01/2018   Influenza,inj,Quad PF,6+ Mos 05/07/2014, 04/14/2015   PFIZER(Purple Top)SARS-COV-2 Vaccination 07/26/2019, 08/21/2019, 03/31/2020   Pneumococcal Conjugate-13 02/01/2018   Pneumococcal Polysaccharide-23 04/17/2008, 04/29/2009   Td 06/04/2002, 10/23/2014   Zoster, Live 05/31/2014    TDAP status: Up to date  Flu Vaccine status: Up to date  Pneumococcal vaccine status: Up to date  Covid-19 vaccine status: Information provided on how to obtain vaccines. Booster due  Qualifies for Shingles Vaccine? Yes   Zostavax completed  Yes   Shingrix Completed?: No.    Education has been provided regarding the importance of this vaccine. Patient has been advised to call insurance company to determine out of pocket expense if they have not yet received this vaccine. Advised may also receive vaccine at local pharmacy or Health Dept. Verbalized acceptance and  understanding.  Screening Tests Health Maintenance  Topic Date Due   Zoster Vaccines- Shingrix (1 of 2) Never done   PNA vac Low Risk Adult (2 of 2 - PPSV23) 02/02/2019   COVID-19 Vaccine (4 - Booster for Pfizer series) 07/29/2020   INFLUENZA VACCINE  12/29/2020   MAMMOGRAM  02/21/2021   COLONOSCOPY (Pts 45-16yrs Insurance coverage will need to be confirmed)  03/06/2024   TETANUS/TDAP  10/22/2024   DEXA SCAN  Completed   Hepatitis C Screening  Completed   HPV VACCINES  Aged Out    Health Maintenance  Health Maintenance Due  Topic Date Due   Zoster Vaccines- Shingrix (1 of 2) Never done   PNA vac Low Risk Adult (2 of 2 - PPSV23) 02/02/2019   COVID-19 Vaccine (4 - Booster for Pfizer series) 07/29/2020    Colorectal cancer screening: Type of screening: Colonoscopy. Completed 03/06/2014. Repeat every 10 years  Mammogram status: Due-Declined  Bone Density status: Completed 05/08/2019. Results reflect: Bone density results: OSTEOPENIA. Repeat every 2 years.  Lung Cancer Screening: (Low Dose CT Chest recommended if Age 58-80 years, 30 pack-year currently smoking OR have quit w/in 15years.) does qualify.   Lung Cancer Screening Referral: Patient declined  Additional Screening:  Hepatitis C Screening: Completed 04/28/2015  Vision Screening: Recommended annual ophthalmology exams for early detection of glaucoma and other disorders of the eye. Is the patient up to date with their annual eye exam?  No  Who is the provider or what is the name of the office in which the patient attends annual eye exams? unsure Patient advsise to make an appt  Dental Screening: Recommended annual dental exams for proper oral hygiene  Community Resource Referral / Chronic Care Management: CRR required this visit?  No   CCM required this visit?  No      Plan:     I have personally reviewed and noted the following in the patient's chart:   Medical and social history Use of alcohol, tobacco or  illicit drugs  Current medications and supplements including opioid prescriptions.  Functional ability and status Nutritional status Physical activity Advanced directives List of other physicians Hospitalizations, surgeries, and ER visits in previous 12 months Vitals Screenings to include cognitive, depression, and falls Referrals and appointments  In addition, I have reviewed and discussed with patient certain preventive protocols, quality metrics, and best practice recommendations. A written personalized care plan for preventive services as well as general preventive health recommendations were provided to patient.     Roanna Raider, LPN   [id number]  Nurse Health Advisor  Nurse Notes: None

## 2020-12-10 ENCOUNTER — Other Ambulatory Visit: Payer: Self-pay

## 2020-12-10 ENCOUNTER — Ambulatory Visit (INDEPENDENT_AMBULATORY_CARE_PROVIDER_SITE_OTHER): Payer: Medicare Other

## 2020-12-10 VITALS — BP 130/90 | HR 85 | Temp 97.2°F | Resp 18 | Ht 61.0 in | Wt 129.0 lb

## 2020-12-10 DIAGNOSIS — Z0000 Encounter for general adult medical examination without abnormal findings: Secondary | ICD-10-CM | POA: Diagnosis not present

## 2020-12-10 NOTE — Patient Instructions (Signed)
Ms. Tasha [name] , Thank you for taking time to come for your Medicare Wellness Visit. I appreciate your ongoing commitment to your health goals. Please review the following plan we discussed and let me know if I can assist you in the future.   Screening recommendations/referrals: Colonoscopy: Completed 03/06/2014-Due 03/06/2024 Mammogram: Due- Please call the office when you are ready to schedule. Bone Density: Completed 05/08/2019-Due 05/07/2021 Recommended yearly ophthalmology/optometry visit for glaucoma screening and checkup Recommended yearly dental visit for hygiene and checkup  Vaccinations: Influenza vaccine: Due 01/2021 Pneumococcal vaccine: Up to date Tdap vaccine: Up to date-Due 10/22/2024 Shingles vaccine: Discuss with pharmacy   Covid-19:2nd booster due-May obtain vaccine at your local pharmacy  Advanced directives: Please bring a copy for your chart  Conditions/risks identified: See problem list  Next appointment: Follow up in one year for your annual wellness visit    Preventive Care 65 Years and Older, Female Preventive care refers to lifestyle choices and visits with your health care provider that can promote health and wellness. What does preventive care include? A yearly physical exam. This is also called an annual well check. Dental exams once or twice a year. Routine eye exams. Ask your health care provider how often you should have your eyes checked. Personal lifestyle choices, including: Daily care of your teeth and gums. Regular physical activity. Eating a healthy diet. Avoiding tobacco and drug use. Limiting alcohol use. Practicing safe sex. Taking low-dose aspirin every day. Taking vitamin and mineral supplements as recommended by your health care provider. What happens during an annual well check? The services and screenings done by your health care provider during your annual well check will depend on your age, overall health, lifestyle risk factors, and  family history of disease. Counseling  Your health care provider may ask you questions about your: Alcohol use. Tobacco use. Drug use. Emotional well-being. Home and relationship well-being. Sexual activity. Eating habits. History of falls. Memory and ability to understand (cognition). Work and work Astronomer. Reproductive health. Screening  You may have the following tests or measurements: Height, weight, and BMI. Blood pressure. Lipid and cholesterol levels. These may be checked every 5 years, or more frequently if you are over 21 years old. Skin check. Lung cancer screening. You may have this screening every year starting at age 91 if you have a 30-pack-year history of smoking and currently smoke or have quit within the past 15 years. Fecal occult blood test (FOBT) of the stool. You may have this test every year starting at age 38. Flexible sigmoidoscopy or colonoscopy. You may have a sigmoidoscopy every 5 years or a colonoscopy every 10 years starting at age 14. Hepatitis C blood test. Hepatitis B blood test. Sexually transmitted disease (STD) testing. Diabetes screening. This is done by checking your blood sugar (glucose) after you have not eaten for a while (fasting). You may have this done every 1-3 years. Bone density scan. This is done to screen for osteoporosis. You may have this done starting at age 7. Mammogram. This may be done every 1-2 years. Talk to your health care provider about how often you should have regular mammograms. Talk with your health care provider about your test results, treatment options, and if necessary, the need for more tests. Vaccines  Your health care provider may recommend certain vaccines, such as: Influenza vaccine. This is recommended every year. Tetanus, diphtheria, and acellular pertussis (Tdap, Td) vaccine. You may need a Td booster every 10 years. Zoster vaccine. You may need this  after age 1. Pneumococcal 13-valent conjugate  (PCV13) vaccine. One dose is recommended after age 65. Pneumococcal polysaccharide (PPSV23) vaccine. One dose is recommended after age 38. Talk to your health care provider about which screenings and vaccines you need and how often you need them. This information is not intended to replace advice given to you by your health care provider. Make sure you discuss any questions you have with your health care provider. Document Released: 06/13/2015 Document Revised: 02/04/2016 Document Reviewed: 03/18/2015 Elsevier Interactive Patient Education  2017 [place] Prevention in the Home Falls can cause injuries. They can happen to people of all ages. There are many things you can do to make your home safe and to help prevent falls. What can I do on the outside of my home? Regularly fix the edges of walkways and driveways and fix any cracks. Remove anything that might make you trip as you walk through a door, such as a raised step or threshold. Trim any bushes or trees on the path to your home. Use bright outdoor lighting. Clear any walking paths of anything that might make someone trip, such as rocks or tools. Regularly check to see if handrails are loose or broken. Make sure that both sides of any steps have handrails. Any raised decks and porches should have guardrails on the edges. Have any leaves, snow, or ice cleared regularly. Use sand or salt on walking paths during winter. Clean up any spills in your garage right away. This includes oil or grease spills. What can I do in the bathroom? Use night lights. Install grab bars by the toilet and in the tub and shower. Do not use towel bars as grab bars. Use non-skid mats or decals in the tub or shower. If you need to sit down in the shower, use a plastic, non-slip stool. Keep the floor dry. Clean up any water that spills on the floor as soon as it happens. Remove soap buildup in the tub or shower regularly. Attach bath mats securely with  double-sided non-slip rug tape. Do not have throw rugs and other things on the floor that can make you trip. What can I do in the bedroom? Use night lights. Make sure that you have a light by your bed that is easy to reach. Do not use any sheets or blankets that are too big for your bed. They should not hang down onto the floor. Have a firm chair that has side arms. You can use this for support while you get dressed. Do not have throw rugs and other things on the floor that can make you trip. What can I do in the kitchen? Clean up any spills right away. Avoid walking on wet floors. Keep items that you use a lot in easy-to-reach places. If you need to reach something above you, use a strong step stool that has a grab bar. Keep electrical cords out of the way. Do not use floor polish or wax that makes floors slippery. If you must use wax, use non-skid floor wax. Do not have throw rugs and other things on the floor that can make you trip. What can I do with my stairs? Do not leave any items on the stairs. Make sure that there are handrails on both sides of the stairs and use them. Fix handrails that are broken or loose. Make sure that handrails are as long as the stairways. Check any carpeting to make sure that it is firmly attached to the  stairs. Fix any carpet that is loose or worn. Avoid having throw rugs at the top or bottom of the stairs. If you do have throw rugs, attach them to the floor with carpet tape. Make sure that you have a light switch at the top of the stairs and the bottom of the stairs. If you do not have them, ask someone to add them for you. What else can I do to help prevent falls? Wear shoes that: Do not have high heels. Have rubber bottoms. Are comfortable and fit you well. Are closed at the toe. Do not wear sandals. If you use a stepladder: Make sure that it is fully opened. Do not climb a closed stepladder. Make sure that both sides of the stepladder are locked  into place. Ask someone to hold it for you, if possible. Clearly mark and make sure that you can see: Any grab bars or handrails. First and last steps. Where the edge of each step is. Use tools that help you move around (mobility aids) if they are needed. These include: Canes. Walkers. Scooters. Crutches. Turn on the lights when you go into a dark area. Replace any light bulbs as soon as they burn out. Set up your furniture so you have a clear path. Avoid moving your furniture around. If any of your floors are uneven, fix them. If there are any pets around you, be aware of where they are. Review your medicines with your doctor. Some medicines can make you feel dizzy. This can increase your chance of falling. Ask your doctor what other things that you can do to help prevent falls. This information is not intended to replace advice given to you by your health care provider. Make sure you discuss any questions you have with your health care provider. Document Released: 03/13/2009 Document Revised: 10/23/2015 Document Reviewed: 06/21/2014 Elsevier Interactive Patient Education  2017 [employer].

## 2021-03-23 DIAGNOSIS — Z23 Encounter for immunization: Secondary | ICD-10-CM | POA: Diagnosis not present

## 2021-04-16 DIAGNOSIS — Z23 Encounter for immunization: Secondary | ICD-10-CM | POA: Diagnosis not present

## 2021-04-19 ENCOUNTER — Other Ambulatory Visit: Payer: Self-pay | Admitting: Family Medicine

## 2021-04-19 DIAGNOSIS — F411 Generalized anxiety disorder: Secondary | ICD-10-CM

## 2021-04-21 ENCOUNTER — Other Ambulatory Visit: Payer: Self-pay | Admitting: Family Medicine

## 2021-04-21 DIAGNOSIS — F411 Generalized anxiety disorder: Secondary | ICD-10-CM

## 2021-04-30 ENCOUNTER — Other Ambulatory Visit: Payer: Self-pay | Admitting: Family Medicine

## 2021-04-30 DIAGNOSIS — F411 Generalized anxiety disorder: Secondary | ICD-10-CM

## 2021-05-04 ENCOUNTER — Other Ambulatory Visit: Payer: Self-pay | Admitting: Family Medicine

## 2021-05-04 DIAGNOSIS — F411 Generalized anxiety disorder: Secondary | ICD-10-CM

## 2021-05-05 ENCOUNTER — Telehealth: Payer: Self-pay

## 2021-05-05 NOTE — Telephone Encounter (Signed)
Nurse Assessment Nurse: Lesly Rubenstein, RN, Crystal Date/Time (Eastern Time): 05/04/2021 7:25:33 PM Confirm and document reason for call. If symptomatic, describe symptoms. ---Caller states she needs a medication refill of her BuSpar. She only has a couple pills left. She would like this called into [facility]. Caller denies any symptoms. States she has enough to last for 24 hours. Advised would send in for office to review and may need authorization. Advised to call back if there are any symptoms or questions. v/u. Does the patient have any new or worsening symptoms? ---No Disp. Time Lamount Cohen Time) Disposition Final User 05/04/2021 6:01:44 PM Send To Nurse Laurence Slate, RN, Rhonda 05/04/2021 7:24:22 PM Attempt made - message left Parrott, RN, Crystal 05/04/2021 7:27:24 PM Clinical Call Yes Lesly Rubenstein, RN, Arville Go

## 2021-05-05 NOTE — Telephone Encounter (Signed)
Okay to refill? Looks like this was discontinued at last OV.

## 2021-05-06 NOTE — Telephone Encounter (Signed)
Have called twice and left 2 messages.  Please clarify if she is still taking buspar and if so what dose.  Per my understanding she had stopped it

## 2021-05-06 NOTE — Telephone Encounter (Signed)
Pt called and said she takes buspar 15mg  twice a day.

## 2021-05-08 MED ORDER — BUSPIRONE HCL 15 MG PO TABS: 15.0000 mg | ORAL_TABLET | Freq: Two times a day (BID) | ORAL | 3 refills | Status: DC

## 2021-05-08 NOTE — Telephone Encounter (Signed)
Okay to send with this script?

## 2021-05-08 NOTE — Addendum Note (Signed)
Addended by: Abbe Amsterdam C on: 05/08/2021 11:38 AM   Modules accepted: Orders

## 2021-06-16 DIAGNOSIS — M8580 Other specified disorders of bone density and structure, unspecified site: Secondary | ICD-10-CM | POA: Diagnosis not present

## 2021-06-16 DIAGNOSIS — Z1159 Encounter for screening for other viral diseases: Secondary | ICD-10-CM | POA: Diagnosis not present

## 2021-06-16 DIAGNOSIS — J449 Chronic obstructive pulmonary disease, unspecified: Secondary | ICD-10-CM | POA: Diagnosis not present

## 2021-06-16 DIAGNOSIS — Z0000 Encounter for general adult medical examination without abnormal findings: Secondary | ICD-10-CM | POA: Diagnosis not present

## 2021-06-16 DIAGNOSIS — F17210 Nicotine dependence, cigarettes, uncomplicated: Secondary | ICD-10-CM | POA: Diagnosis not present

## 2021-06-16 DIAGNOSIS — Z79899 Other long term (current) drug therapy: Secondary | ICD-10-CM | POA: Diagnosis not present

## 2021-06-16 DIAGNOSIS — F411 Generalized anxiety disorder: Secondary | ICD-10-CM | POA: Diagnosis not present

## 2021-07-14 ENCOUNTER — Other Ambulatory Visit: Payer: Self-pay

## 2021-07-14 ENCOUNTER — Inpatient Hospital Stay (HOSPITAL_COMMUNITY)
Admission: EM | Admit: 2021-07-14 | Discharge: 2021-07-27 | DRG: 190 | Disposition: A | Discharge status: Skilled Nursing Facility | Payer: Medicare Other | Attending: Family Medicine | Admitting: Family Medicine

## 2021-07-14 ENCOUNTER — Encounter (HOSPITAL_COMMUNITY): Payer: Self-pay

## 2021-07-14 ENCOUNTER — Emergency Department (HOSPITAL_COMMUNITY): Payer: Medicare Other

## 2021-07-14 DIAGNOSIS — J9622 Acute and chronic respiratory failure with hypercapnia: Secondary | ICD-10-CM | POA: Diagnosis not present

## 2021-07-14 DIAGNOSIS — R0602 Shortness of breath: Secondary | ICD-10-CM | POA: Diagnosis not present

## 2021-07-14 DIAGNOSIS — Z7401 Bed confinement status: Secondary | ICD-10-CM | POA: Diagnosis not present

## 2021-07-14 DIAGNOSIS — J449 Chronic obstructive pulmonary disease, unspecified: Secondary | ICD-10-CM | POA: Diagnosis not present

## 2021-07-14 DIAGNOSIS — T380X5A Adverse effect of glucocorticoids and synthetic analogues, initial encounter: Secondary | ICD-10-CM | POA: Diagnosis present

## 2021-07-14 DIAGNOSIS — Z79899 Other long term (current) drug therapy: Secondary | ICD-10-CM

## 2021-07-14 DIAGNOSIS — J9601 Acute respiratory failure with hypoxia: Secondary | ICD-10-CM | POA: Diagnosis not present

## 2021-07-14 DIAGNOSIS — M419 Scoliosis, unspecified: Secondary | ICD-10-CM | POA: Diagnosis present

## 2021-07-14 DIAGNOSIS — Z91048 Other nonmedicinal substance allergy status: Secondary | ICD-10-CM

## 2021-07-14 DIAGNOSIS — F17210 Nicotine dependence, cigarettes, uncomplicated: Secondary | ICD-10-CM | POA: Diagnosis present

## 2021-07-14 DIAGNOSIS — R0689 Other abnormalities of breathing: Secondary | ICD-10-CM | POA: Diagnosis not present

## 2021-07-14 DIAGNOSIS — F32A Depression, unspecified: Secondary | ICD-10-CM | POA: Diagnosis present

## 2021-07-14 DIAGNOSIS — Z8249 Family history of ischemic heart disease and other diseases of the circulatory system: Secondary | ICD-10-CM

## 2021-07-14 DIAGNOSIS — F419 Anxiety disorder, unspecified: Secondary | ICD-10-CM | POA: Diagnosis present

## 2021-07-14 DIAGNOSIS — Z86718 Personal history of other venous thrombosis and embolism: Secondary | ICD-10-CM | POA: Diagnosis not present

## 2021-07-14 DIAGNOSIS — E7800 Pure hypercholesterolemia, unspecified: Secondary | ICD-10-CM | POA: Diagnosis present

## 2021-07-14 DIAGNOSIS — R627 Adult failure to thrive: Secondary | ICD-10-CM | POA: Diagnosis present

## 2021-07-14 DIAGNOSIS — R410 Disorientation, unspecified: Secondary | ICD-10-CM | POA: Diagnosis not present

## 2021-07-14 DIAGNOSIS — J441 Chronic obstructive pulmonary disease with (acute) exacerbation: Principal | ICD-10-CM | POA: Diagnosis present

## 2021-07-14 DIAGNOSIS — D72829 Elevated white blood cell count, unspecified: Secondary | ICD-10-CM | POA: Diagnosis present

## 2021-07-14 DIAGNOSIS — I10 Essential (primary) hypertension: Secondary | ICD-10-CM | POA: Diagnosis present

## 2021-07-14 DIAGNOSIS — E278 Other specified disorders of adrenal gland: Secondary | ICD-10-CM | POA: Diagnosis present

## 2021-07-14 DIAGNOSIS — K219 Gastro-esophageal reflux disease without esophagitis: Secondary | ICD-10-CM | POA: Diagnosis present

## 2021-07-14 DIAGNOSIS — Z20822 Contact with and (suspected) exposure to covid-19: Secondary | ICD-10-CM | POA: Diagnosis present

## 2021-07-14 DIAGNOSIS — R0609 Other forms of dyspnea: Secondary | ICD-10-CM | POA: Diagnosis not present

## 2021-07-14 DIAGNOSIS — R7303 Prediabetes: Secondary | ICD-10-CM | POA: Diagnosis present

## 2021-07-14 DIAGNOSIS — Z0001 Encounter for general adult medical examination with abnormal findings: Secondary | ICD-10-CM | POA: Diagnosis not present

## 2021-07-14 DIAGNOSIS — Z6820 Body mass index (BMI) 20.0-20.9, adult: Secondary | ICD-10-CM

## 2021-07-14 DIAGNOSIS — J9621 Acute and chronic respiratory failure with hypoxia: Secondary | ICD-10-CM | POA: Diagnosis not present

## 2021-07-14 DIAGNOSIS — Z885 Allergy status to narcotic agent status: Secondary | ICD-10-CM

## 2021-07-14 DIAGNOSIS — I700 Atherosclerosis of aorta: Secondary | ICD-10-CM | POA: Diagnosis not present

## 2021-07-14 DIAGNOSIS — Z888 Allergy status to other drugs, medicaments and biological substances status: Secondary | ICD-10-CM

## 2021-07-14 DIAGNOSIS — Z833 Family history of diabetes mellitus: Secondary | ICD-10-CM | POA: Diagnosis not present

## 2021-07-14 DIAGNOSIS — R64 Cachexia: Secondary | ICD-10-CM | POA: Diagnosis present

## 2021-07-14 DIAGNOSIS — M8580 Other specified disorders of bone density and structure, unspecified site: Secondary | ICD-10-CM | POA: Diagnosis not present

## 2021-07-14 DIAGNOSIS — R739 Hyperglycemia, unspecified: Secondary | ICD-10-CM

## 2021-07-14 DIAGNOSIS — F29 Unspecified psychosis not due to a substance or known physiological condition: Secondary | ICD-10-CM | POA: Diagnosis not present

## 2021-07-14 LAB — CBC WITH DIFFERENTIAL/PLATELET
Abs Immature Granulocytes: 0.02 10*3/uL (ref 0.00–0.07)
Basophils Absolute: 0 10*3/uL (ref 0.0–0.1)
Basophils Relative: 0 %
Eosinophils Absolute: 0.2 10*3/uL (ref 0.0–0.5)
Eosinophils Relative: 2 %
HCT: 47.1 % — ABNORMAL HIGH (ref 36.0–46.0)
Hemoglobin: 15.3 g/dL — ABNORMAL HIGH (ref 12.0–15.0)
Immature Granulocytes: 0 %
Lymphocytes Relative: 20 %
Lymphs Abs: 1.9 10*3/uL (ref 0.7–4.0)
MCH: 30.8 pg (ref 26.0–34.0)
MCHC: 32.5 g/dL (ref 30.0–36.0)
MCV: 94.8 fL (ref 80.0–100.0)
Monocytes Absolute: 0.8 10*3/uL (ref 0.1–1.0)
Monocytes Relative: 8 %
Neutro Abs: 6.7 10*3/uL (ref 1.7–7.7)
Neutrophils Relative %: 70 %
Platelets: 296 10*3/uL (ref 150–400)
RBC: 4.97 MIL/uL (ref 3.87–5.11)
RDW: 14.1 % (ref 11.5–15.5)
WBC: 9.6 10*3/uL (ref 4.0–10.5)
nRBC: 0 % (ref 0.0–0.2)

## 2021-07-14 LAB — BLOOD GAS, VENOUS
Acid-Base Excess: 1.9 mmol/L (ref 0.0–2.0)
Bicarbonate: 27.8 mmol/L (ref 20.0–28.0)
O2 Saturation: 79.8 %
Patient temperature: 37
pCO2, Ven: 46 mmHg (ref 44–60)
pH, Ven: 7.39 (ref 7.25–7.43)
pO2, Ven: 49 mmHg — ABNORMAL HIGH (ref 32–45)

## 2021-07-14 LAB — BASIC METABOLIC PANEL
Anion gap: 7 (ref 5–15)
BUN: 26 mg/dL — ABNORMAL HIGH (ref 8–23)
CO2: 29 mmol/L (ref 22–32)
Calcium: 10 mg/dL (ref 8.9–10.3)
Chloride: 103 mmol/L (ref 98–111)
Creatinine, Ser: 0.63 mg/dL (ref 0.44–1.00)
GFR, Estimated: >60 mL/min (ref >60)
Glucose, Bld: 124 mg/dL — ABNORMAL HIGH (ref 70–99)
Potassium: 3.7 mmol/L (ref 3.5–5.1)
Sodium: 139 mmol/L (ref 135–145)

## 2021-07-14 LAB — RESP PANEL BY RT-PCR (FLU A&B, COVID) ARPGX2
Influenza A by PCR: NEGATIVE
Influenza B by PCR: NEGATIVE
SARS Coronavirus 2 by RT PCR: NEGATIVE

## 2021-07-14 LAB — TROPONIN I (HIGH SENSITIVITY)
Troponin I (High Sensitivity): 6 ng/L (ref <18)
Troponin I (High Sensitivity): 5 ng/L (ref <18)

## 2021-07-14 MED ORDER — ALBUTEROL SULFATE (2.5 MG/3ML) 0.083% IN NEBU: 2.5000 mg | INHALATION_SOLUTION | RESPIRATORY_TRACT | Status: DC | PRN

## 2021-07-14 MED ORDER — ACETAMINOPHEN 325 MG PO TABS: 650.0000 mg | ORAL_TABLET | Freq: Four times a day (QID) | ORAL | Status: DC | PRN

## 2021-07-14 MED ORDER — ALBUTEROL SULFATE (2.5 MG/3ML) 0.083% IN NEBU
7.5000 mg | INHALATION_SOLUTION | Freq: Once | RESPIRATORY_TRACT | Status: AC
  Administered 2021-07-14: 7.5 mg via RESPIRATORY_TRACT
  Filled 2021-07-14: qty 9

## 2021-07-14 MED ORDER — IPRATROPIUM-ALBUTEROL 0.5-2.5 (3) MG/3ML IN SOLN: 3.0000 mL | Freq: Once | RESPIRATORY_TRACT | Status: DC

## 2021-07-14 MED ORDER — IPRATROPIUM-ALBUTEROL 0.5-2.5 (3) MG/3ML IN SOLN
RESPIRATORY_TRACT | Status: AC
  Filled 2021-07-14: qty 3

## 2021-07-14 MED ORDER — IPRATROPIUM-ALBUTEROL 0.5-2.5 (3) MG/3ML IN SOLN
3.0000 mL | Freq: Four times a day (QID) | RESPIRATORY_TRACT | Status: DC
  Administered 2021-07-14: 3 mL via RESPIRATORY_TRACT
  Filled 2021-07-14: qty 3

## 2021-07-14 MED ORDER — IPRATROPIUM-ALBUTEROL 0.5-2.5 (3) MG/3ML IN SOLN
9.0000 mL | Freq: Once | RESPIRATORY_TRACT | Status: AC
  Administered 2021-07-14: 9 mL via RESPIRATORY_TRACT
  Filled 2021-07-14: qty 3

## 2021-07-14 MED ORDER — MAGNESIUM SULFATE 2 GM/50ML IV SOLN
2.0000 g | Freq: Once | INTRAVENOUS | Status: AC
  Administered 2021-07-14: 2 g via INTRAVENOUS
  Filled 2021-07-14: qty 50

## 2021-07-14 MED ORDER — ESCITALOPRAM OXALATE 10 MG PO TABS
10.0000 mg | ORAL_TABLET | Freq: Every day | ORAL | Status: DC
  Administered 2021-07-15 – 2021-07-27 (×13): 10 mg via ORAL
  Filled 2021-07-14 (×13): qty 1

## 2021-07-14 MED ORDER — ENOXAPARIN SODIUM 40 MG/0.4ML IJ SOSY
40.0000 mg | PREFILLED_SYRINGE | INTRAMUSCULAR | Status: DC
  Administered 2021-07-14 – 2021-07-26 (×13): 40 mg via SUBCUTANEOUS
  Filled 2021-07-14 (×13): qty 0.4

## 2021-07-14 MED ORDER — IPRATROPIUM-ALBUTEROL 0.5-2.5 (3) MG/3ML IN SOLN
3.0000 mL | Freq: Four times a day (QID) | RESPIRATORY_TRACT | Status: DC
  Administered 2021-07-15 (×4): 3 mL via RESPIRATORY_TRACT
  Filled 2021-07-14 (×4): qty 3

## 2021-07-14 MED ORDER — SODIUM CHLORIDE 0.9% FLUSH
3.0000 mL | Freq: Two times a day (BID) | INTRAVENOUS | Status: DC
  Administered 2021-07-14 – 2021-07-27 (×21): 3 mL via INTRAVENOUS

## 2021-07-14 MED ORDER — PREDNISONE 20 MG PO TABS
40.0000 mg | ORAL_TABLET | Freq: Every day | ORAL | Status: DC
  Administered 2021-07-15: 40 mg via ORAL
  Filled 2021-07-14: qty 2

## 2021-07-14 MED ORDER — ALBUTEROL (5 MG/ML) CONTINUOUS INHALATION SOLN: 7.5000 mg/h | INHALATION_SOLUTION | Freq: Once | RESPIRATORY_TRACT | Status: DC

## 2021-07-14 MED ORDER — POLYETHYLENE GLYCOL 3350 17 G PO PACK
17.0000 g | PACK | Freq: Every day | ORAL | Status: DC | PRN
Start: 2021-07-14 — End: 2021-07-27

## 2021-07-14 MED ORDER — SIMVASTATIN 10 MG PO TABS
10.0000 mg | ORAL_TABLET | Freq: Every day | ORAL | Status: DC
  Administered 2021-07-15 – 2021-07-27 (×13): 10 mg via ORAL
  Filled 2021-07-14 (×13): qty 1

## 2021-07-14 MED ORDER — BUSPIRONE HCL 5 MG PO TABS
15.0000 mg | ORAL_TABLET | Freq: Two times a day (BID) | ORAL | Status: DC
  Administered 2021-07-14 – 2021-07-27 (×26): 15 mg via ORAL
  Filled 2021-07-14 (×26): qty 3

## 2021-07-14 MED ORDER — ACETAMINOPHEN 650 MG RE SUPP: 650.0000 mg | Freq: Four times a day (QID) | RECTAL | Status: DC | PRN

## 2021-07-14 MED ORDER — MELATONIN 3 MG PO TABS
3.0000 mg | ORAL_TABLET | Freq: Every evening | ORAL | Status: DC | PRN
  Administered 2021-07-18 – 2021-07-25 (×7): 3 mg via ORAL
  Filled 2021-07-14 (×7): qty 1

## 2021-07-14 MED ORDER — METHYLPREDNISOLONE SODIUM SUCC 125 MG IJ SOLR
125.0000 mg | Freq: Once | INTRAMUSCULAR | Status: AC
  Administered 2021-07-14: 125 mg via INTRAVENOUS
  Filled 2021-07-14: qty 2

## 2021-07-14 NOTE — ED Notes (Signed)
Graham crackers and drink provided.

## 2021-07-14 NOTE — H&P (Signed)
History and Physical   Tasha [name] GYI:[id number] DOB: 06-Jun-1950 DOA: 07/14/2021  PCP: Pearline Cables, MD   Patient coming from: Home  Chief Complaint: Shortness of breath  HPI: Tasha [name] is a 71 y.o. female with medical history significant of scoliosis, hyperlipidemia, anxiety, depression, not allergy, hypertension, DVT, COPD, asthma, alcohol use, GERD presenting with shortness of breath.  Patient reports that she has had shortness of breath ongoing for several weeks and has been gradually worsening.  In the past 2 days however it has worsened further and prompted ED evaluation.  Currently does not have any albuterol at home and is using Primatene mist.  She states that someone had recommended to her starting Symbicort and albuterol instead.  Denies fevers, chills, abdominal pain, constipation, diarrhea, nausea, vomiting.  ED Course: Vital signs in the ED significant for respiration 20s to 30s, heart rate in the 90s to 110s, blood pressure in the 130s to 150s systolic, initially hypoxic around 88% requiring 2 L to maintain saturations.  Lab work-up showed BMP with BUN 26, glucose 124.  CBC with hemoglobin 15.3 which is stable.  Troponin normal with repeat pending.  Rester panel for flu and COVID-negative.  Chest x-ray showed no acute abnormality.  VBG showed normal pH and PCO2.  Patient received Solu-Medrol, magnesium, DuoNeb, albuterol in the ED.  Continued accessory muscle use not fully recovered despite these measures.  Patient somewhat hesitant to be admitted but ultimately agrees would be better to make sure this is taken care of.  Review of Systems: As per HPI otherwise all other systems reviewed and are negative.  Past Medical History:  Diagnosis Date   Abnormal nuclear stress test 06/10/2015   Allergy    Anxiety    Arthritis    DDD lumbar; B thumbs   Asthma    age 24   Clotting disorder (HCC)    Depression    DVT (deep venous thrombosis) (HCC)    GERD  (gastroesophageal reflux disease)    Hyperlipidemia    Hypertension    Psoriasiform dermatitis     Past Surgical History:  Procedure Laterality Date   NOSE SURGERY     TONSILLECTOMY     TUBAL LIGATION      Social History  reports that she has been smoking cigarettes. She has a 23.00 pack-year smoking history. She has never used smokeless tobacco. She reports that she does not drink alcohol and does not use drugs.  Allergies  Allergen Reactions   Acitretin Swelling and Rash   Pollen Extract Shortness Of Breath    Leaves, pt. reports   Percodan [Oxycodone-Aspirin]     Pt does not remember   Statins Other (See Comments)    Muscle aches in legs     Family History  Problem Relation Age of Onset   Cancer Mother 2       colorectal cancer   Stroke Mother 77       CVA   Alcohol abuse Mother    Diabetes Mother    Hypertension Mother    Heart disease Father    Cancer Maternal Grandfather        lung   Alcohol abuse Brother    Heart attack Brother    Cancer Maternal Aunt        breast   Breast cancer Maternal Aunt 72  Reviewed on admission  Prior to Admission medications   Medication Sig Start Date End Date Taking? Authorizing Provider  busPIRone (BUSPAR) 15 MG  tablet Take 1 tablet (15 mg total) by mouth 2 (two) times daily. 05/08/21   Copland, Gwenlyn Found, MD  Cholecalciferol (VITAMIN D3) 50 MCG (2000 UT) TABS Take by mouth.    [provider]  Coenzyme Q10 (COQ10 PO) Take by mouth.    [provider]  diphenhydrAMINE HCl, Sleep, (SLEEP AID) 25 MG CAPS Take by mouth.    [provider]  escitalopram (LEXAPRO) 10 MG tablet Take 1 tablet (10 mg total) by mouth daily. 10/08/20   Copland, Gwenlyn Found, MD  ipratropium-albuterol (DUONEB) 0.5-2.5 (3) MG/3ML SOLN INHALE VIA NEBULIZER EVERY 6 HOURS AS NEEDED 07/30/19   Copland, Gwenlyn Found, MD  Melatonin 3 MG CAPS Take 3 mg by mouth at bedtime.    [provider]  Omega-3 Fatty Acids (SALMON OIL PO)  Take by mouth.    [provider]  risedronate (ACTONEL) 35 MG tablet TAKE ONE TABLET BY MOUTH WITH WATER OM AN EMPTY STOMACH EVERY 7 DAYS. NOTHING BY MOUTH OR LIE DOWN FOR NEXT 30 MINS 10/08/20   Copland, Gwenlyn Found, MD  simvastatin (ZOCOR) 10 MG tablet Take 1 tablet (10 mg total) by mouth daily. 10/08/20   Copland, Gwenlyn Found, MD  triamcinolone cream (KENALOG) 0.1 % Apply 1 application topically 2 (two) times daily. 10/08/20   Copland, Gwenlyn Found, MD  vitamin E 1000 UNIT capsule Take 1,000 Units by mouth daily.    [provider]    Physical Exam: Vitals:   07/14/21 1630 07/14/21 1655 07/14/21 1730 07/14/21 1818  BP: 134/86  (!) 141/81 (!) 147/92  Pulse: 100  (!) 109 (!) 114  Resp: (!) 31  (!) 25 (!) 33  Temp:      TempSrc:      SpO2: 90% (!) 88% 92% (!) 88%  Weight:      Height:        Physical Exam Constitutional:      General: She is not in acute distress.    Appearance: Normal appearance.  HENT:     Head: Normocephalic and atraumatic.     Mouth/Throat:     Mouth: Mucous membranes are moist.     Pharynx: Oropharynx is clear.  Eyes:     Extraocular Movements: Extraocular movements intact.     Pupils: Pupils are equal, round, and reactive to light.  Cardiovascular:     Rate and Rhythm: Regular rhythm. Tachycardia present.     Pulses: Normal pulses.     Heart sounds: Normal heart sounds.  Pulmonary:     Effort: Accessory muscle usage present. No respiratory distress.     Breath sounds: Wheezing present.  Abdominal:     General: Bowel sounds are normal. There is no distension.     Palpations: Abdomen is soft.     Tenderness: There is no abdominal tenderness.  Musculoskeletal:        General: No swelling or deformity.  Skin:    General: Skin is warm and dry.  Neurological:     General: No focal deficit present.     Mental Status: Mental status is at baseline.   Labs on Admission: I have personally reviewed following labs and imaging  studies  CBC: Recent Labs  Lab 07/14/21 1540  WBC 9.6  NEUTROABS 6.7  HGB 15.3*  HCT 47.1*  MCV 94.8  PLT 296    Basic Metabolic Panel: Recent Labs  Lab 07/14/21 1433  NA 139  K 3.7  CL 103  CO2 29  GLUCOSE 124*  BUN 26*  CREATININE 0.63  CALCIUM 10.0    GFR: Estimated Creatinine Clearance: 50.3 mL/min (by C-G formula based on SCr of 0.63 mg/dL).  Liver Function Tests: No results for input(s): AST, ALT, ALKPHOS, BILITOT, PROT, ALBUMIN in the last 168 hours.  Urine analysis:    Component Value Date/Time   COLORURINE YELLOW 11/24/2013 0222   APPEARANCEUR CLEAR 11/24/2013 0222   LABSPEC 1.013 11/24/2013 0222   PHURINE 7.0 11/24/2013 0222   GLUCOSEU NEGATIVE 11/24/2013 0222   HGBUR NEGATIVE 11/24/2013 0222   HGBUR negative 10/01/2008 1054   BILIRUBINUR neg 05/02/2018 1510   KETONESUR negative 04/28/2015 0949   KETONESUR NEGATIVE 11/24/2013 0222   PROTEINUR Positive (A) 05/02/2018 1510   PROTEINUR NEGATIVE 11/24/2013 0222   UROBILINOGEN 0.2 05/02/2018 1510   UROBILINOGEN 0.2 11/24/2013 0222   NITRITE neg 05/02/2018 1510   NITRITE NEGATIVE 11/24/2013 0222   LEUKOCYTESUR Large (3+) (A) 05/02/2018 1510    Radiological Exams on Admission: DG Chest Port 1 View  Result Date: 07/14/2021 CLINICAL DATA:  COPD EXAM: PORTABLE CHEST 1 VIEW COMPARISON:  None. FINDINGS: Normal mediastinum and cardiac silhouette. Normal pulmonary vasculature. No evidence of effusion, infiltrate, or pneumothorax. No acute bony abnormality. IMPRESSION: No acute cardiopulmonary process. Electronically Signed   By: Genevive Bi M.D.   On: 07/14/2021 15:24    EKG: Independently reviewed.  Sinus rhythm at 96 bpm.  Low voltage aVL.  Assessment/Plan Principal Problem:   COPD exacerbation (HCC)   COPD exacerbation > Patient presenting with initially gradually increasing and now rapidly increasing shortness of breath. > History of COPD currently out of albuterol.  Saturating as low as 88%  on room air, improved on 2 L. > Continued accessory muscle use despite interventions in the ED which included Solu-Medrol, magnesium, DuoNebs, albuterol. - Monitor on telemetry - Continue prednisone daily - Scheduled DuoNebs - As needed albuterol - Check respiratory viral panel for etiology.  Anxiety Depression - Continue home BuSpar and Lexapro  Hyperlipidemia - Continue home statin  DVT prophylaxis: Lovenox Code Status:   Full Family Communication:  Discussed at bedside. Disposition Plan:   Patient is from:  Home  Anticipated DC to:  Home  Anticipated DC date:  1 to 2 days  Anticipated DC barriers: None  Consults called:  None Admission status:  Observation, telemetry  Severity of Illness: The appropriate patient status for this patient is OBSERVATION. Observation status is judged to be reasonable and necessary in order to provide the required intensity of service to ensure the patient's safety. The patient's presenting symptoms, physical exam findings, and initial radiographic and laboratory data in the context of their medical condition is felt to place them at decreased risk for further clinical deterioration. Furthermore, it is anticipated that the patient will be medically stable for discharge from the hospital within 2 midnights of admission.    Synetta Fail MD [facility]  How to contact the [facility] Attending or Consulting provider 7A - 7P or covering provider during after hours 7P -7A, for this patient?   Check the care team in [facility] and look for a) attending/consulting [facility] provider listed and b) the [facility] team listed Log into [email or web address] and use [facility]'s universal password to access. If you do not have the password, please contact the hospital operator. Locate the [facility] provider you are looking for under [facility] and page to a number that you can be directly reached. If you still have difficulty reaching the provider, please page the [facility] (Director  on Call) for the Hospitalists listed on amion for assistance.  07/14/2021, 8:53 PM

## 2021-07-14 NOTE — ED Provider Triage Note (Addendum)
Emergency Medicine Provider Triage Evaluation Note  Tasha [name] , a 71 y.o. female  was evaluated in triage.  Pt sent from PCP office for COPD exacerbation.  Patient reports worsening shortness of breath over the past few days not improving with over-the-counter Primatene Mist rescue inhaler, she reports she does not have any other medication she uses chronically for her COPD.  No chronic oxygen requirement but currently on 2 L with increased work of breathing.  She denies associated cough or fever.  No chest pain.  Review of Systems  Positive: Shortness of breath Negative: Fever, cough, chest pain  Physical Exam  BP (!) 150/83 (BP Location: Left Arm)    Pulse 95    Temp 97.9 F (36.6 C) (Oral)    Resp (!) 28    Ht 5\' 2"  (1.575 m)    Wt 50.3 kg    SpO2 94%    BMI 20.30 kg/m  Gen:   Awake, no distress   Resp:  Tachypneic with increased respiratory effort, only able to speak in short sentences, on 2 L nasal cannula, on auscultation patient has little to no air movement bilaterally, no audible wheezing MSK:   Moves extremities without difficulty  Other:    Medical Decision Making  Medically screening exam initiated at 2:28 PM.  Appropriate orders placed.  Tasha [name] was informed that the remainder of the evaluation will be completed by another provider, this initial triage assessment does not replace that evaluation, and the importance of remaining in the ED until their evaluation is complete.  Patient with respiratory distress likely in the setting of COPD exacerbation, very tight on exam with very little air movement.  Discussed with charge nurse and triage nurse that patient will need acute bed for work more immediate treatment and evaluation       Fabian November, Dartha Lodge 07/14/21 1438

## 2021-07-14 NOTE — ED Provider Notes (Signed)
[facility]-EMERGENCY DEPT Provider Note  CSN: [id number] Arrival date & time: 07/14/21 1416  Chief Complaint(s) No chief complaint on file.  HPI Tasha [name] is a 71 y.o. female with PMH COPD, DVT not on anticoagulation who presents the emergency department for evaluation of shortness of breath.  Patient states that her shortness of breath has been getting gradually worse over the last several weeks but worsened over the last 48 hours significantly more.  Denies fever, chest pain, abdominal pain, nausea, vomiting, cough or other systemic symptoms.  Of note, patient currently states that she does not have a albuterol inhaler but is using Primatene Mist for her COPD control.  HPI  Past Medical History Past Medical History:  Diagnosis Date   Abnormal nuclear stress test 06/10/2015   Allergy    Anxiety    Arthritis    DDD lumbar; B thumbs   Asthma    age 70   Clotting disorder (HCC)    Depression    DVT (deep venous thrombosis) (HCC)    GERD (gastroesophageal reflux disease)    Hyperlipidemia    Hypertension    Psoriasiform dermatitis    Patient Active Problem List   Diagnosis Date Noted   COPD with acute exacerbation (HCC) 12/10/2017   Acute on chronic respiratory failure with hypoxia (HCC) 12/10/2017   Generalized anxiety disorder 12/09/2017   COPD, group C, by GOLD 2017 classification (HCC) 08/24/2016   Nut allergy 09/03/2015   Abnormal nuclear stress test 06/10/2015   Family history of colorectal cancer 05/27/2015   Tobacco abuse 05/27/2015   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 Authorizing Provider  busPIRone (BUSPAR) 15 MG tablet Take 1 tablet (15 mg total) by mouth 2 (two) times daily. 05/08/21   Copland, Gwenlyn Found, MD  Cholecalciferol (VITAMIN D3) 50 MCG (2000 UT) TABS Take by mouth.    [provider]  Coenzyme Q10 (COQ10 PO) Take by mouth.    [provider]  diphenhydrAMINE HCl, Sleep, (SLEEP AID) 25 MG CAPS Take by mouth.    [provider]  escitalopram (LEXAPRO) 10 MG tablet Take 1 tablet (10 mg total) by mouth daily. 10/08/20   Copland, Gwenlyn Found, MD  ipratropium-albuterol (DUONEB) 0.5-2.5 (3) MG/3ML SOLN INHALE VIA NEBULIZER EVERY 6 HOURS AS NEEDED 07/30/19   Copland, Gwenlyn Found, MD  Melatonin 3 MG CAPS Take 3 mg by mouth at bedtime.    [provider]  Omega-3 Fatty Acids (SALMON OIL PO) Take by mouth.    [provider]  risedronate (ACTONEL) 35 MG tablet TAKE ONE TABLET BY MOUTH WITH WATER OM AN EMPTY STOMACH EVERY 7 DAYS. NOTHING BY MOUTH OR LIE DOWN FOR NEXT 30 MINS 10/08/20   Copland, Gwenlyn Found, MD  simvastatin (ZOCOR) 10 MG tablet Take 1 tablet (10 mg total) by mouth daily. 10/08/20   Copland, Gwenlyn Found, MD  triamcinolone cream (KENALOG) 0.1 % Apply 1 application topically 2 (two) times daily. 10/08/20   Copland, Gwenlyn Found, MD  vitamin E 1000 UNIT capsule Take 1,000 Units by mouth daily.    [provider]  Past Surgical History Past Surgical History:  Procedure Laterality Date   NOSE SURGERY     TONSILLECTOMY     TUBAL LIGATION     Family History Family History  Problem Relation Age of Onset   Cancer Mother 5972       colorectal cancer   Stroke Mother 6070       CVA   Alcohol abuse Mother    Diabetes Mother    Hypertension Mother    Heart disease Father    Cancer Maternal Grandfather        lung   Alcohol  abuse Brother    Heart attack Brother    Cancer Maternal Aunt        breast   Breast cancer Maternal Aunt 875    Social History Social History   Tobacco Use   Smoking status: Every Day    Packs/day: 0.50    Years: 46.00    Pack years: 23.00    Types: Cigarettes   Smokeless tobacco: Never  Vaping Use   Vaping Use: Never used  Substance Use Topics   Alcohol use: No   Drug use: No   Allergies Acitretin, Pollen extract, Percodan [oxycodone-aspirin], and Statins  Review of Systems Review of Systems  Respiratory:  Positive for shortness of breath.    Physical Exam Vital Signs  I have reviewed the triage vital signs BP 122/88    Pulse 99    Temp 97.9 F (36.6 C) (Oral)    Resp (!) 24    Ht 5\' 2"  (1.575 m)    Wt 50.3 kg    SpO2 97%    BMI 20.30 kg/m   Physical Exam Vitals and nursing note reviewed.  Constitutional:      General: She is not in acute distress.    Appearance: She is well-developed.  HENT:     Head: Normocephalic and atraumatic.  Eyes:     Conjunctiva/sclera: Conjunctivae normal.  Cardiovascular:     Rate and Rhythm: Normal rate and regular rhythm.     Heart sounds: No murmur heard. Pulmonary:     Effort: Respiratory distress present.     Breath sounds: Wheezing present.  Abdominal:     Palpations: Abdomen is soft.     Tenderness: There is no abdominal tenderness.  Musculoskeletal:        General: No swelling.     Cervical back: Neck supple.  Skin:    General: Skin is warm and dry.     Capillary Refill: Capillary refill takes less than 2 seconds.  Neurological:     Mental Status: She is alert.  Psychiatric:        Mood and Affect: Mood normal.    ED Results and Treatments Labs (all labs ordered are listed, but only abnormal results are displayed) Labs Reviewed  BASIC METABOLIC PANEL - Abnormal; Notable for the following components:      Result Value   Glucose, Bld 124 (*)    BUN 26 (*)    All other components within normal limits  RESP  PANEL BY RT-PCR (FLU A&B, COVID) ARPGX2  CBC WITH DIFFERENTIAL/PLATELET  CBC WITH DIFFERENTIAL/PLATELET  TROPONIN I (HIGH SENSITIVITY)  Radiology DG Chest Port 1 View  Result Date: 07/14/2021 CLINICAL DATA:  COPD EXAM: PORTABLE CHEST 1 VIEW COMPARISON:  None. FINDINGS: Normal mediastinum and cardiac silhouette. Normal pulmonary vasculature. No evidence of effusion, infiltrate, or pneumothorax. No acute bony abnormality. IMPRESSION: No acute cardiopulmonary process. Electronically Signed   By: Genevive Bi M.D.   On: 07/14/2021 15:24    Pertinent labs & imaging results that were available during my care of the patient were reviewed by me and considered in my medical decision making (see MDM for details).  Medications Ordered in ED Medications  magnesium sulfate IVPB 2 g 50 mL (2 g Intravenous New Bag/Given 07/14/21 1537)  ipratropium-albuterol (DUONEB) 0.5-2.5 (3) MG/3ML nebulizer solution (has no administration in time range)  ipratropium-albuterol (DUONEB) 0.5-2.5 (3) MG/3ML nebulizer solution (has no administration in time range)  methylPREDNISolone sodium succinate (SOLU-MEDROL) 125 mg/2 mL injection 125 mg (125 mg Intravenous Given 07/14/21 1532)  ipratropium-albuterol (DUONEB) 0.5-2.5 (3) MG/3ML nebulizer solution 9 mL (9 mLs Nebulization Given 07/14/21 1535)                                                                                                                                     Procedures .Critical Care Performed by: Glendora Score, MD Authorized by: Glendora Score, MD   Critical care provider statement:    Critical care time (minutes):  30   Critical care was necessary to treat or prevent imminent or life-threatening deterioration of the following conditions:  Respiratory failure   Critical care was time spent personally by me on the following  activities:  Development of treatment plan with patient or surrogate, discussions with consultants, evaluation of patient's response to treatment, examination of patient, ordering and review of laboratory studies, ordering and review of radiographic studies, ordering and performing treatments and interventions, pulse oximetry, re-evaluation of patient's condition and review of old charts  (including critical care time)  Medical Decision Making / ED Course   This patient presents to the ED for concern of shortness of breath, this involves an extensive number of treatment options, and is a complaint that carries with it a high risk of complications and morbidity.  The differential diagnosis includes COPD exacerbation, pneumonia, PTX, PE, ACS  MDM: Patient seen Emergency Department for evaluation of shortness of breath.  Physical exam reveals patient with tachypnea, expiratory wheezing but is otherwise unremarkable.  No significant lower extremity edema.  Laboratory evaluation currently pending.  Chest x-ray with no pneumonia.  Patient given 3 DuoNebs back-to-back to back and methylprednisolone as well as magnesium.  Patient arrives on 2 L nasal cannula but she is satting 97% on room air.  Patient then signed out to oncoming provider.  Please see provider sign out for continuation of work-up.   Additional history obtained:  -External records from outside source obtained and reviewed including: Chart review including previous notes, labs, imaging, consultation notes   Lab Tests: -I  ordered, reviewed, and interpreted labs.   The pertinent results include:   Labs Reviewed  BASIC METABOLIC PANEL - Abnormal; Notable for the following components:      Result Value   Glucose, Bld 124 (*)    BUN 26 (*)    All other components within normal limits  RESP PANEL BY RT-PCR (FLU A&B, COVID) ARPGX2  CBC WITH DIFFERENTIAL/PLATELET  CBC WITH DIFFERENTIAL/PLATELET  TROPONIN I (HIGH SENSITIVITY)      EKG    EKG Interpretation  Date/Time:  Tuesday July 14 2021 15:18:06 EST Ventricular Rate:  96 PR Interval:  116 QRS Duration: 86 QT Interval:  348 QTC Calculation: 440 R Axis:   58 Text Interpretation: Sinus rhythm Confirmed by Itzel Mckibbin ([id number]) on 07/14/2021 3:41:52 PM         Imaging Studies ordered: I ordered imaging studies including CXR I independently visualized and interpreted imaging. I agree with the radiologist interpretation   Medicines ordered and prescription drug management: Meds ordered this encounter  Medications   DISCONTD: ipratropium-albuterol (DUONEB) 0.5-2.5 (3) MG/3ML nebulizer solution 3 mL   methylPREDNISolone sodium succinate (SOLU-MEDROL) 125 mg/2 mL injection 125 mg   magnesium sulfate IVPB 2 g 50 mL   ipratropium-albuterol (DUONEB) 0.5-2.5 (3) MG/3ML nebulizer solution 9 mL   ipratropium-albuterol (DUONEB) 0.5-2.5 (3) MG/3ML nebulizer solution    Lowell Guitar, Robin F: cabinet override   ipratropium-albuterol (DUONEB) 0.5-2.5 (3) MG/3ML nebulizer solution    [place]ter[place] Big F: cabinet override    -I have reviewed the patients home medicines and have made adjustments as needed  Critical interventions Multiple duoneb   Cardiac Monitoring: The patient was maintained on a cardiac monitor.  I personally viewed and interpreted the cardiac monitored which showed an underlying rhythm of: NSR  Social Determinants of Health:  Factors impacting patients care include: none   Reevaluation: After the interventions noted above, I reevaluated the patient and found that they have :improved  Co morbidities that complicate the patient evaluation  Past Medical History:  Diagnosis Date   Abnormal nuclear stress test 06/10/2015   Allergy    Anxiety    Arthritis    DDD lumbar; B thumbs   Asthma    age 3   Clotting disorder (HCC)    Depression    DVT (deep venous thrombosis) (HCC)    GERD (gastroesophageal reflux disease)    Hyperlipidemia     Hypertension    Psoriasiform dermatitis       Dispostion: I considered admission for this patient, but work-up is currently pending.  Please see providers note for continuation of work-up.  Anticipate admission.     Final Clinical Impression(s) / ED Diagnoses Final diagnoses:  None     @PCDICTATION @    , MD 07/14/21 1600

## 2021-07-14 NOTE — ED Triage Notes (Signed)
Per EMS-coming from PCP's office, [facility], SOB getting worse over the past several weeks-history of COPD-rescue inhaler not helping-RR in 50's which patient states is normal for her

## 2021-07-14 NOTE — ED Provider Notes (Signed)
Signout from Dr.Kommor.  71 year old female COPD here with increased shortness of breath.  Patient is tachypneic.  Chest x-ray does not show any acute infiltrates.  Awaiting labs.  Probable admission. Physical Exam  BP 134/86    Pulse 100    Temp 97.9 F (36.6 C) (Oral)    Resp (!) 31    Ht 5\' 2"  (1.575 m)    Wt 50.3 kg    SpO2 90%    BMI 20.30 kg/m   Physical Exam  Procedures  Procedures  ED Course / MDM    Medical Decision Making Amount and/or Complexity of Data Reviewed Labs: ordered.  Risk Prescription drug management.   Discussed with [facility] hospitalist Dr. who will evaluate patient for admission.       Alinda Money, MD 07/15/21 [phone]

## 2021-07-15 ENCOUNTER — Observation Stay (HOSPITAL_COMMUNITY): Payer: Medicare Other

## 2021-07-15 DIAGNOSIS — R0609 Other forms of dyspnea: Secondary | ICD-10-CM

## 2021-07-15 DIAGNOSIS — Z7401 Bed confinement status: Secondary | ICD-10-CM | POA: Diagnosis not present

## 2021-07-15 DIAGNOSIS — F32A Depression, unspecified: Secondary | ICD-10-CM | POA: Diagnosis present

## 2021-07-15 DIAGNOSIS — Z885 Allergy status to narcotic agent status: Secondary | ICD-10-CM | POA: Diagnosis not present

## 2021-07-15 DIAGNOSIS — Z888 Allergy status to other drugs, medicaments and biological substances status: Secondary | ICD-10-CM | POA: Diagnosis not present

## 2021-07-15 DIAGNOSIS — J9601 Acute respiratory failure with hypoxia: Secondary | ICD-10-CM | POA: Diagnosis not present

## 2021-07-15 DIAGNOSIS — J9621 Acute and chronic respiratory failure with hypoxia: Secondary | ICD-10-CM | POA: Diagnosis present

## 2021-07-15 DIAGNOSIS — K219 Gastro-esophageal reflux disease without esophagitis: Secondary | ICD-10-CM | POA: Diagnosis present

## 2021-07-15 DIAGNOSIS — T380X5A Adverse effect of glucocorticoids and synthetic analogues, initial encounter: Secondary | ICD-10-CM | POA: Diagnosis present

## 2021-07-15 DIAGNOSIS — E278 Other specified disorders of adrenal gland: Secondary | ICD-10-CM | POA: Diagnosis present

## 2021-07-15 DIAGNOSIS — Z833 Family history of diabetes mellitus: Secondary | ICD-10-CM | POA: Diagnosis not present

## 2021-07-15 DIAGNOSIS — Z8249 Family history of ischemic heart disease and other diseases of the circulatory system: Secondary | ICD-10-CM | POA: Diagnosis not present

## 2021-07-15 DIAGNOSIS — Z79899 Other long term (current) drug therapy: Secondary | ICD-10-CM | POA: Diagnosis not present

## 2021-07-15 DIAGNOSIS — Z20822 Contact with and (suspected) exposure to covid-19: Secondary | ICD-10-CM | POA: Diagnosis present

## 2021-07-15 DIAGNOSIS — J9622 Acute and chronic respiratory failure with hypercapnia: Secondary | ICD-10-CM | POA: Diagnosis present

## 2021-07-15 DIAGNOSIS — M419 Scoliosis, unspecified: Secondary | ICD-10-CM | POA: Diagnosis present

## 2021-07-15 DIAGNOSIS — E7800 Pure hypercholesterolemia, unspecified: Secondary | ICD-10-CM | POA: Diagnosis present

## 2021-07-15 DIAGNOSIS — R410 Disorientation, unspecified: Secondary | ICD-10-CM | POA: Diagnosis not present

## 2021-07-15 DIAGNOSIS — F419 Anxiety disorder, unspecified: Secondary | ICD-10-CM | POA: Diagnosis present

## 2021-07-15 DIAGNOSIS — D72829 Elevated white blood cell count, unspecified: Secondary | ICD-10-CM | POA: Diagnosis present

## 2021-07-15 DIAGNOSIS — R64 Cachexia: Secondary | ICD-10-CM | POA: Diagnosis present

## 2021-07-15 DIAGNOSIS — F17210 Nicotine dependence, cigarettes, uncomplicated: Secondary | ICD-10-CM | POA: Diagnosis present

## 2021-07-15 DIAGNOSIS — R7303 Prediabetes: Secondary | ICD-10-CM | POA: Diagnosis present

## 2021-07-15 DIAGNOSIS — I10 Essential (primary) hypertension: Secondary | ICD-10-CM | POA: Diagnosis present

## 2021-07-15 DIAGNOSIS — Z86718 Personal history of other venous thrombosis and embolism: Secondary | ICD-10-CM | POA: Diagnosis not present

## 2021-07-15 DIAGNOSIS — R627 Adult failure to thrive: Secondary | ICD-10-CM | POA: Diagnosis present

## 2021-07-15 DIAGNOSIS — F29 Unspecified psychosis not due to a substance or known physiological condition: Secondary | ICD-10-CM | POA: Diagnosis not present

## 2021-07-15 DIAGNOSIS — J441 Chronic obstructive pulmonary disease with (acute) exacerbation: Secondary | ICD-10-CM | POA: Diagnosis present

## 2021-07-15 DIAGNOSIS — Z91048 Other nonmedicinal substance allergy status: Secondary | ICD-10-CM | POA: Diagnosis not present

## 2021-07-15 LAB — CBC
HCT: 42.1 % (ref 36.0–46.0)
Hemoglobin: 13.6 g/dL (ref 12.0–15.0)
MCH: 30.3 pg (ref 26.0–34.0)
MCHC: 32.3 g/dL (ref 30.0–36.0)
MCV: 93.8 fL (ref 80.0–100.0)
Platelets: 287 10*3/uL (ref 150–400)
RBC: 4.49 MIL/uL (ref 3.87–5.11)
RDW: 14.3 % (ref 11.5–15.5)
WBC: 11 10*3/uL — ABNORMAL HIGH (ref 4.0–10.5)
nRBC: 0 % (ref 0.0–0.2)

## 2021-07-15 LAB — RESPIRATORY PANEL BY PCR

## 2021-07-15 LAB — ECHOCARDIOGRAM COMPLETE
AR max vel: 2.27 cm2
AV Peak grad: 6.8 mmHg
Ao pk vel: 1.3 m/s
Area-P 1/2: 4.57 cm2
Calc EF: 61.4 %
Height: 62 in
S' Lateral: 3 cm
Single Plane A2C EF: 62.8 %
Single Plane A4C EF: 62.2 %
Weight: 1798.95 oz

## 2021-07-15 LAB — BASIC METABOLIC PANEL
Anion gap: 6 (ref 5–15)
BUN: 23 mg/dL (ref 8–23)
CO2: 28 mmol/L (ref 22–32)
Calcium: 9.2 mg/dL (ref 8.9–10.3)
Chloride: 104 mmol/L (ref 98–111)
Creatinine, Ser: 0.72 mg/dL (ref 0.44–1.00)
GFR, Estimated: >60 mL/min (ref >60)
Glucose, Bld: 144 mg/dL — ABNORMAL HIGH (ref 70–99)
Potassium: 3.7 mmol/L (ref 3.5–5.1)
Sodium: 138 mmol/L (ref 135–145)

## 2021-07-15 MED ORDER — GUAIFENESIN ER 600 MG PO TB12
600.0000 mg | ORAL_TABLET | Freq: Two times a day (BID) | ORAL | Status: DC
  Administered 2021-07-15 – 2021-07-20 (×11): 600 mg via ORAL
  Filled 2021-07-15 (×11): qty 1

## 2021-07-15 MED ORDER — FLUTICASONE FUROATE-VILANTEROL 200-25 MCG/ACT IN AEPB
1.0000 | INHALATION_SPRAY | Freq: Every day | RESPIRATORY_TRACT | Status: DC
  Administered 2021-07-15 – 2021-07-20 (×6): 1 via RESPIRATORY_TRACT
  Filled 2021-07-15: qty 28

## 2021-07-15 MED ORDER — METHYLPREDNISOLONE SODIUM SUCC 125 MG IJ SOLR
60.0000 mg | Freq: Two times a day (BID) | INTRAMUSCULAR | Status: DC
  Administered 2021-07-15 – 2021-07-18 (×7): 60 mg via INTRAVENOUS
  Filled 2021-07-15 (×7): qty 2

## 2021-07-15 NOTE — Progress Notes (Signed)
SATURATION QUALIFICATIONS: (This note is used to comply with regulatory documentation for home oxygen)  Patient Saturations on Room Air at Rest = 90%  Patient Saturations on Room Air while Ambulating = 85%  Patient Saturations on 5 Liters of oxygen while Ambulating = 89%  Please briefly explain why patient needs home oxygen:  Patient required 5L [place] to maintain saturations about 88%.

## 2021-07-15 NOTE — Progress Notes (Signed)
PROGRESS NOTE  Tasha [name]  DOB: [birth year]  PCP: Pearline Cables, MD FOY:[id number]  DOA: 07/14/2021  LOS: 0 days  Hospital Day: 2  Brief narrative: Tasha [name] is a 71 y.o. female with PMH significant for COPD, chronic smoking, HTN, HLD, anxiety, depression, arthritis, GERD, DVT, scoliosis who lives at home alone. Patient was brought to the ED on 07/14/2021 for several weeks of progressively worsening shortness of breath, cough.  In the ED, patient was afebrile, hypoxic at 88% requiring 2 L oxygen nasal cannula.  Heart rate in 90s, respiratory rate in 20s, WBC count 15.3 Rest virus panel negative for flu and COVID PCR Chest x-ray did not show any abnormality VBG with normal pH and PCO2 Patient was given IV Solu-Medrol, DuoNeb, magnesium despite which she continued to be in respiratory distress and hence kept in observation to hospitalist service.  Subjective: Patient was seen and examined this morning.  Pleasant elderly Caucasian female.  Lying on bed.  On 3 L oxygen by nasal at rest.  Patient's sister and friend at bedside. Early in the morning, she walked on the hallway with RN and required 5 L oxygen by nasal collar to maintain saturation more than 90%.  Principal Problem:   COPD exacerbation (HCC)   Assessment and Plan: Acute exacerbation of COPD  Acute respiratory failure with hypoxia -History of COPD, emphysema, not on bronchodilator nebs at home.  No pulmonary follow-up.  Continues to smoke.   -Presented with progressively worsening shortness of breath and cough for several weeks.   -Currently requiring 2 L oxygen at rest and 5 L on ambulation. -On examination, patient has diminished air entry in both bases, scattered wheezing, using accessory muscles of respiration. -Currently on oral prednisone, bronchodilators. -With her significant respite distress, I switched her to IV Solu-Medrol 60 mg twice daily this morning. -Start on [employer], DuoNeb,  Mucinex, incentive spirometry -Encourage ambulation -May require home oxygen  Anxiety/Depression - Continue home BuSpar and Lexapro  Hyperlipidemia -Supposed to but not taking statin  Mobility: Encourage ambulation Goals of care   Code Status: Full Code    Nutritional status:  Body mass index is 20.56 kg/m.      Diet:  Diet Order             Diet Heart Room service appropriate? Yes; Fluid consistency: Thin  Diet effective now                   DVT prophylaxis:  enoxaparin (LOVENOX) injection 40 mg Start: 07/14/21 2200   Antimicrobials: None Fluid: None Consultants: None Family Communication: Sister and friend at bedside  Status is: Observation  Continue in-hospital care because: Needs IV steroids, further monitoring Level of care: Telemetry   Dispo: The patient is from: Home              Anticipated d/c is to: Hopefully home in 2 to 3 days              Patient currently is not medically stable to d/c.   Difficult to place patient No     Infusions:    Scheduled Meds:  busPIRone  15 mg Oral BID   enoxaparin (LOVENOX) injection  40 mg Subcutaneous Q24H   escitalopram  10 mg Oral Daily   fluticasone furoate-vilanterol  1 puff Inhalation Daily   guaiFENesin  600 mg Oral BID   ipratropium-albuterol  3 mL Nebulization QID   methylPREDNISolone (SOLU-MEDROL) injection  60 mg Intravenous Q12H  simvastatin  10 mg Oral Daily   sodium chloride flush  3 mL Intravenous Q12H    PRN meds: acetaminophen **OR** acetaminophen, albuterol, melatonin, polyethylene glycol   Antimicrobials: Anti-infectives (From admission, onward)    None       Objective: Vitals:   07/15/21 1235 07/15/21 1257  BP:  (!) 154/79  Pulse:  98  Resp:  20  Temp:  98 F (36.7 C)  SpO2: 95% 93%    Intake/Output Summary (Last 24 hours) at 07/15/2021 1311 Last data filed at 07/14/2021 1642 Gross per 24 hour  Intake 44.24 ml  Output --  Net 44.24 ml   Filed Weights    07/14/21 1436 07/14/21 2302  Weight: 50.3 kg 51 kg   Weight change:  Body mass index is 20.56 kg/m.   Physical Exam: General exam: Pleasant, elderly Caucasian female.  Lying down in bed.  Mild respiratory distress Skin: No rashes, lesions or ulcers. HEENT: Atraumatic, normocephalic, no obvious bleeding Lungs: Diminished air entry in both bases.  Mild scattered wheezing CVS: Regular rate and rhythm, no murmur GI/Abd soft, nontender, nondistended, bowel sound present CNS: Alert, awake, oriented x3 Psychiatry: Mood appropriate Extremities: No pedal edema, no calf tenderness  Data Review: I have personally reviewed the laboratory data and studies available.  F/u labs ordered Unresulted Labs (From admission, onward)     Start     Ordered   07/21/21 0500  Creatinine, serum  (enoxaparin (LOVENOX)    CrCl >/= 30 ml/min)  Weekly,   R     Comments: while on enoxaparin therapy    07/14/21 1846   07/16/21 0500  Basic metabolic panel  Tomorrow morning,   R        07/15/21 1311   07/14/21 1433  CBC with Differential  Once,   STAT        07/14/21 1432            Signed, Lorin Glass, MD [facility] 07/15/2021

## 2021-07-16 DIAGNOSIS — J441 Chronic obstructive pulmonary disease with (acute) exacerbation: Secondary | ICD-10-CM | POA: Diagnosis not present

## 2021-07-16 LAB — BASIC METABOLIC PANEL
Anion gap: 6 (ref 5–15)
BUN: 16 mg/dL (ref 8–23)
CO2: 26 mmol/L (ref 22–32)
Calcium: 9.2 mg/dL (ref 8.9–10.3)
Chloride: 104 mmol/L (ref 98–111)
Creatinine, Ser: 0.66 mg/dL (ref 0.44–1.00)
GFR, Estimated: >60 mL/min (ref >60)
Glucose, Bld: 135 mg/dL — ABNORMAL HIGH (ref 70–99)
Potassium: 4.1 mmol/L (ref 3.5–5.1)
Sodium: 136 mmol/L (ref 135–145)

## 2021-07-16 MED ORDER — ALBUTEROL SULFATE (2.5 MG/3ML) 0.083% IN NEBU
2.5000 mg | INHALATION_SOLUTION | Freq: Four times a day (QID) | RESPIRATORY_TRACT | Status: DC | PRN
  Administered 2021-07-17 – 2021-07-20 (×5): 2.5 mg via RESPIRATORY_TRACT
  Filled 2021-07-16 (×5): qty 3

## 2021-07-16 MED ORDER — IPRATROPIUM-ALBUTEROL 0.5-2.5 (3) MG/3ML IN SOLN
3.0000 mL | Freq: Three times a day (TID) | RESPIRATORY_TRACT | Status: DC
  Administered 2021-07-16 – 2021-07-18 (×7): 3 mL via RESPIRATORY_TRACT
  Filled 2021-07-16 (×7): qty 3

## 2021-07-16 MED ORDER — HYDROCHLOROTHIAZIDE 12.5 MG PO TABS
12.5000 mg | ORAL_TABLET | Freq: Every day | ORAL | Status: DC
  Administered 2021-07-16 – 2021-07-20 (×5): 12.5 mg via ORAL
  Filled 2021-07-16 (×5): qty 1

## 2021-07-16 NOTE — Progress Notes (Signed)
PROGRESS NOTE  Tasha [name]  DOB: 05-Jan-1951  PCP: Pearline Cables, MD AXE:[id number]  DOA: 07/14/2021  LOS: 1 day  Hospital Day: 3  Brief narrative: Tasha [name] is a 71 y.o. female with PMH significant for COPD, chronic smoking, HTN, HLD, anxiety, depression, arthritis, GERD, DVT, scoliosis who lives at home alone. Patient was brought to the ED on 07/14/2021 for several weeks of progressively worsening shortness of breath, cough.  In the ED, patient was afebrile, hypoxic at 88% requiring 2 L oxygen nasal cannula.  Heart rate in 90s, respiratory rate in 20s, WBC count 15.3 Rest virus panel negative for flu and COVID PCR Chest x-ray did not show any abnormality VBG with normal pH and PCO2 Patient was given IV Solu-Medrol, DuoNeb, magnesium despite which she continued to be in respiratory distress and hence kept in observation to hospitalist service.  Subjective: Patient was seen and examined this morning.  Propped up in bed.  On 3 L oxygen at rest.  Still not able to complete a sentence.  Required 5 L on ambulation to keep saturation over 90%.  Family not at bedside today.   Patient was hoping that she will be able to feel better and go home today.  However still is dyspneic, unable to complete a sentence and would like to stay another night.    Principal Problem:   COPD exacerbation (HCC)   Assessment and Plan: Acute exacerbation of COPD  Acute respiratory failure with hypoxia -History of COPD, emphysema, not on bronchodilator nebs at home.  No pulmonary follow-up.  Continues to smoke.   -Presented with progressively worsening shortness of breath and cough for several weeks.   -Currently on IV Solu-Medrol 60 mg twice daily.  Still dyspneic, I would not taper steroids today. -Breo Ellipta, DuoNeb, Mucinex, incentive spirometry -Encourage ambulation -Echo with EF 65 to 70%, G1 DD  Anxiety/Depression - Continue home BuSpar and  Lexapro  Hyperlipidemia -Supposed to but not taking statin  Mobility: Encourage ambulation Goals of care   Code Status: Full Code    Nutritional status:  Body mass index is 20.56 kg/m.      Diet:  Diet Order             Diet Heart Room service appropriate? Yes; Fluid consistency: Thin  Diet effective now                   DVT prophylaxis:  enoxaparin (LOVENOX) injection 40 mg Start: 07/14/21 2200   Antimicrobials: None Fluid: None Consultants: None Family Communication: Family not at bedside today  Status is: Observation  Continue in-hospital care because: Continues to require IV steroids, needs further monitoring Level of care: Telemetry   Dispo: The patient is from: Home              Anticipated d/c is to: Hopefully home in 1 to 2 days              Patient currently is not medically stable to d/c.   Difficult to place patient No     Infusions:    Scheduled Meds:  busPIRone  15 mg Oral BID   enoxaparin (LOVENOX) injection  40 mg Subcutaneous Q24H   escitalopram  10 mg Oral Daily   fluticasone furoate-vilanterol  1 puff Inhalation Daily   guaiFENesin  600 mg Oral BID   hydrochlorothiazide  12.5 mg Oral Daily   ipratropium-albuterol  3 mL Nebulization TID   methylPREDNISolone (SOLU-MEDROL) injection  60 mg Intravenous  Q12H   simvastatin  10 mg Oral Daily   sodium chloride flush  3 mL Intravenous Q12H    PRN meds: acetaminophen **OR** acetaminophen, albuterol, melatonin, polyethylene glycol   Antimicrobials: Anti-infectives (From admission, onward)    None       Objective: Vitals:   07/16/21 0435 07/16/21 0857  BP: (!) 168/94   Pulse: (!) 103   Resp: 20   Temp: 98.2 F (36.8 C)   SpO2: 93% 95%   No intake or output data in the 24 hours ending 07/16/21 1152  Filed Weights   07/14/21 1436 07/14/21 2302  Weight: 50.3 kg 51 kg   Weight change:  Body mass index is 20.56 kg/m.   Physical Exam: General exam: Pleasant, elderly  Caucasian female.  Lying down in bed.  Mild respiratory distress Skin: No rashes, lesions or ulcers. HEENT: Atraumatic, normocephalic, no obvious bleeding Lungs: Diminished air entry in both bases.  Continues to have mild scattered wheezing.  Coughs on deep breathing CVS: Regular rate and rhythm, no murmur GI/Abd soft, nontender, nondistended, bowel sound present CNS: Alert, awake, oriented x3 Psychiatry: Mood appropriate Extremities: No pedal edema, no calf tenderness  Data Review: I have personally reviewed the laboratory data and studies available.  F/u labs ordered Unresulted Labs (From admission, onward)     Start     Ordered   07/21/21 0500  Creatinine, serum  (enoxaparin (LOVENOX)    CrCl >/= 30 ml/min)  Weekly,   R     Comments: while on enoxaparin therapy    07/14/21 1846   07/14/21 1433  CBC with Differential  Once,   STAT        07/14/21 1432            Signed, Lorin Glass, MD [facility] 07/16/2021

## 2021-07-16 NOTE — Progress Notes (Signed)
SATURATION QUALIFICATIONS: (This note is used to comply with regulatory documentation for home oxygen)  Patient Saturations on Room Air at Rest = 88%  Patient Saturations on Room Air while Ambulating = 84%  Patient Saturations on 5 Liters of oxygen while Ambulating = 90%  Please briefly explain why patient needs home oxygen:

## 2021-07-17 DIAGNOSIS — J441 Chronic obstructive pulmonary disease with (acute) exacerbation: Secondary | ICD-10-CM | POA: Diagnosis not present

## 2021-07-17 NOTE — Progress Notes (Signed)
PROGRESS NOTE    Tasha [name]  [id number] DOB: [birth year] DOA: 07/14/2021 PCP: Darreld Mclean, MD   Brief Narrative:  Tasha [name] is a 71 y.o. female with PMH significant for COPD, chronic smoking, HTN, HLD, anxiety, depression, arthritis, GERD, DVT, scoliosis who lives at home alone. Patient was brought to the ED on 07/14/2021 for several weeks of progressively worsening shortness of breath, cough.   In the ED, patient was afebrile, hypoxic at 88% requiring 2 L oxygen nasal cannula.  Heart rate in 90s, respiratory rate in 20s, WBC count 15.3 Rest virus panel negative for flu and COVID PCR Chest x-ray did not show any abnormality VBG with normal pH and PCO2 Patient was given IV Solu-Medrol, DuoNeb, magnesium despite which she continued to be in respiratory distress and hence kept in observation to hospitalist service.  Assessment & Plan:  Acute exacerbation of COPD  Acute respiratory failure with hypoxia -History of COPD, emphysema, not on bronchodilator nebs at home.  No pulmonary follow-up.  Continues to smoke.   -Presented with progressively worsening shortness of breath and cough for several weeks.  -CXR: negative, resp panel: negative  -Still dyspneic and has diffuse wheezing on exam. -Continue IV steroids, Breo Ellipta, DuoNeb, Mucinex, incentive spirometry -Encourage ambulation -Echo with EF 65 to 70%, G1 DD -Requiring 3 L of oxygen via nasal cannula  Hypertension: Blood pressure is stable.  Continue hydrochlorothiazide 12.5 mg once daily  Hyperlipidemia: Continue statin   Anxiety/Depression - Continue home BuSpar and Lexapro  Leukocytosis: WBC 11.0.  Likely due to steroids. -Repeat CBC tomorrow a.m.   DVT prophylaxis: Lovenox Code Status: Full code Family Communication:  None present at bedside.  Plan of care discussed with patient in length and she verbalized understanding and agreed with it. Disposition Plan: Likely home in 1 to 2  days  Consultants:  None  Procedures:  None  Antimicrobials:  None  Status is: Inpatient     Subjective: Patient seen and examined.  Reports that breathing is better but still not back to baseline.  Continues to have wheezing.  No fever, chills.  No acute events overnight.  Objective: Vitals:   07/16/21 1415 07/16/21 2013 07/17/21 0419 07/17/21 0752  BP:  (!) 123/96 124/63   Pulse:  (!) 108 97   Resp:  18 18   Temp:  98.3 F (36.8 C) 98.6 F (37 C)   TempSrc:  Oral Oral   SpO2: 94% 92% 90% (!) 89%  Weight:      Height:        Intake/Output Summary (Last 24 hours) at 07/17/2021 1207 Last data filed at 07/16/2021 1330 Gross per 24 hour  Intake 220 ml  Output --  Net 220 ml   Filed Weights   07/14/21 1436 07/14/21 2302  Weight: 50.3 kg 51 kg    Examination:  General exam: Appears calm and comfortable, on r nasal cannula, respiratory system: Diffuse expiratory wheezing.  No rhonchi or crackles.  Using accessory muscles Cardiovascular system: S1 & S2 heard, RRR. No JVD, murmurs, rubs, gallops or clicks. No pedal edema. Gastrointestinal system: Abdomen is nondistended, soft and nontender. No organomegaly or masses felt. Normal bowel sounds heard. Central nervous system: Alert and oriented. No focal neurological deficits. Extremities: Symmetric 5 x 5 power. Skin: No rashes, lesions or ulcers Psychiatry: Judgement and insight appear normal. Mood & affect appropriate.    Data Reviewed: I have personally reviewed following labs and imaging studies  CBC: Recent Labs  Lab  07/14/21 1540 07/15/21 0611  WBC 9.6 11.0*  NEUTROABS 6.7  --   HGB 15.3* 13.6  HCT 47.1* 42.1  MCV 94.8 93.8  PLT 296 [id number]   Basic Metabolic Panel: Recent Labs  Lab 07/14/21 1433 07/15/21 0611 07/16/21 0508  NA 139 138 136  K 3.7 3.7 4.1  CL 103 104 104  CO2 29 28 26   GLUCOSE 124* 144* 135*  BUN 26* 23 16  CREATININE 0.63 0.72 0.66  CALCIUM 10.0 9.2 9.2   GFR: Estimated  Creatinine Clearance: 50.3 mL/min (by C-G formula based on SCr of 0.66 mg/dL). Liver Function Tests: No results for input(s): AST, ALT, ALKPHOS, BILITOT, PROT, ALBUMIN in the last 168 hours. No results for input(s): LIPASE, AMYLASE in the last 168 hours. No results for input(s): AMMONIA in the last 168 hours. Coagulation Profile: No results for input(s): INR, PROTIME in the last 168 hours. Cardiac Enzymes: No results for input(s): CKTOTAL, CKMB, CKMBINDEX, TROPONINI in the last 168 hours. BNP (last 3 results) No results for input(s): PROBNP in the last 8760 hours. HbA1C: No results for input(s): HGBA1C in the last 72 hours. CBG: No results for input(s): GLUCAP in the last 168 hours. Lipid Profile: No results for input(s): CHOL, HDL, LDLCALC, TRIG, CHOLHDL, LDLDIRECT in the last 72 hours. Thyroid Function Tests: No results for input(s): TSH, T4TOTAL, FREET4, T3FREE, THYROIDAB in the last 72 hours. Anemia Panel: No results for input(s): VITAMINB12, FOLATE, FERRITIN, TIBC, IRON, RETICCTPCT in the last 72 hours. Sepsis Labs: No results for input(s): PROCALCITON, LATICACIDVEN in the last 168 hours.  Recent Results (from the past 240 hour(s))  Resp Panel by RT-PCR (Flu A&B, Covid) Nasopharyngeal Swab     Status: None   Collection Time: 07/14/21  2:33 PM   Specimen: Nasopharyngeal Swab; Nasopharyngeal(NP) swabs in vial transport medium  Result Value Ref Range Status   SARS Coronavirus 2 by RT PCR NEGATIVE NEGATIVE Final    Comment: (NOTE) SARS-CoV-2 target nucleic acids are NOT DETECTED.  The SARS-CoV-2 RNA is generally detectable in upper respiratory specimens during the acute phase of infection. The lowest concentration of SARS-CoV-2 viral copies this assay can detect is 138 copies/mL. A negative result does not preclude SARS-Cov-2 infection and should not be used as the sole basis for treatment or other patient management decisions. A negative result may occur with  improper  specimen collection/handling, submission of specimen other than nasopharyngeal swab, presence of viral mutation(s) within the areas targeted by this assay, and inadequate number of viral copies(<138 copies/mL). A negative result must be combined with clinical observations, patient history, and epidemiological information. The expected result is Negative.  Fact Sheet for Patients:  EntrepreneurPulse.com.au  Fact Sheet for Healthcare Providers:  IncredibleEmployment.be  This test is no t yet approved or cleared by the [place] FDA and  has been authorized for detection and/or diagnosis of SARS-CoV-2 by FDA under an Emergency Use Authorization (EUA). This EUA will remain  in effect (meaning this test can be used) for the duration of the COVID-19 declaration under Section 564(b)(1) of the Act, 21 U.S.C.section 360bbb-3(b)(1), unless the authorization is terminated  or revoked sooner.       Influenza A by PCR NEGATIVE NEGATIVE Final   Influenza B by PCR NEGATIVE NEGATIVE Final    Comment: (NOTE) The Xpert Xpress SARS-CoV-2/FLU/RSV plus assay is intended as an aid in the diagnosis of influenza from Nasopharyngeal swab specimens and should not be used as a sole basis for treatment. Nasal washings and  aspirates are unacceptable for Xpert Xpress SARS-CoV-2/FLU/RSV testing.  Fact Sheet for Patients: EntrepreneurPulse.com.au  Fact Sheet for Healthcare Providers: IncredibleEmployment.be  This test is not yet approved or cleared by the [place] FDA and has been authorized for detection and/or diagnosis of SARS-CoV-2 by FDA under an Emergency Use Authorization (EUA). This EUA will remain in effect (meaning this test can be used) for the duration of the COVID-19 declaration under Section 564(b)(1) of the Act, 21 U.S.C. section 360bbb-3(b)(1), unless the authorization is terminated or revoked.  Performed at  [facility], [place] [address], [place], [place] [address]   Respiratory (~20 pathogens) panel by PCR     Status: None   Collection Time: 07/14/21  6:47 PM   Specimen: Nasopharyngeal Swab; Respiratory  Result Value Ref Range Status   Adenovirus NOT DETECTED NOT DETECTED Final   Coronavirus 229E NOT DETECTED NOT DETECTED Final    Comment: (NOTE) The Coronavirus on the Respiratory Panel, DOES NOT test for the novel  Coronavirus (2019 nCoV)    Coronavirus HKU1 NOT DETECTED NOT DETECTED Final   Coronavirus NL63 NOT DETECTED NOT DETECTED Final   Coronavirus OC43 NOT DETECTED NOT DETECTED Final   Metapneumovirus NOT DETECTED NOT DETECTED Final   Rhinovirus / Enterovirus NOT DETECTED NOT DETECTED Final   Influenza A NOT DETECTED NOT DETECTED Final   Influenza B NOT DETECTED NOT DETECTED Final   Parainfluenza Virus 1 NOT DETECTED NOT DETECTED Final   Parainfluenza Virus 2 NOT DETECTED NOT DETECTED Final   Parainfluenza Virus 3 NOT DETECTED NOT DETECTED Final   Parainfluenza Virus 4 NOT DETECTED NOT DETECTED Final   Respiratory Syncytial Virus NOT DETECTED NOT DETECTED Final   Bordetella pertussis NOT DETECTED NOT DETECTED Final   Bordetella Parapertussis NOT DETECTED NOT DETECTED Final   Chlamydophila pneumoniae NOT DETECTED NOT DETECTED Final   Mycoplasma pneumoniae NOT DETECTED NOT DETECTED Final    Comment: Performed at [facility], [place]. [address], [place],  [address]      Radiology Studies: ECHOCARDIOGRAM COMPLETE  Result Date: 07/15/2021    ECHOCARDIOGRAM REPORT   Patient Name:   ROSLIN SHIA [facility] Date of Exam: 07/15/2021 Medical Rec #:  [id number]            Height:       62.0 in Accession #:    [id number]           Weight:       112.4 lb Date of Birth:  [birth year]            BSA:          1.497 m Patient Age:    30 years             BP:           154/79 mmHg Patient Gender: F                    HR:           100 bpm. Exam Location:  Inpatient  Procedure: 2D Echo, Cardiac Doppler and Color Doppler Indications:    Dyspnea  History:        Patient has no prior history of Echocardiogram examinations.                 COPD; Risk Factors:Hypertension.  Sonographer:    Jyl Heinz Referring Phys: [id number] Ider  1. Left ventricular ejection fraction, by estimation, is 65 to 70%. The left  ventricle has normal function. The left ventricle has no regional wall motion abnormalities. Left ventricular diastolic parameters are consistent with Grade I diastolic dysfunction (impaired relaxation).  2. Right ventricular systolic function is normal. The right ventricular size is normal.  3. The mitral valve is normal in structure. Trivial mitral valve regurgitation. No evidence of mitral stenosis.  4. The aortic valve is tricuspid. There is mild calcification of the aortic valve. Aortic valve regurgitation is not visualized. Aortic valve sclerosis/calcification is present, without any evidence of aortic stenosis.  5. The inferior vena cava is normal in size with greater than 50% respiratory variability, suggesting right atrial pressure of 3 mmHg.  6. 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 Cannot exclude a small PFO. FINDINGS  Left Ventricle: Left ventricular ejection fraction, by estimation, is 65 to 70%. The left ventricle has normal function. The left ventricle has no regional wall motion abnormalities. The left ventricular internal cavity size was normal in size. There is  no left ventricular hypertrophy. Left ventricular diastolic parameters are consistent with Grade I diastolic dysfunction (impaired relaxation). Right Ventricle: The right ventricular size is normal. No increase in right ventricular wall thickness. Right ventricular systolic function is normal. Left Atrium: Left atrial size was normal in size. Right Atrium: Right atrial size was normal in size. Pericardium: There is no evidence of pericardial effusion. Mitral Valve: The mitral valve is  normal in structure. Trivial mitral valve regurgitation. No evidence of mitral valve stenosis. Tricuspid Valve: The tricuspid valve is normal in structure. Tricuspid valve regurgitation is trivial. No evidence of tricuspid stenosis. Aortic Valve: The aortic valve is tricuspid. There is mild calcification of the aortic valve. Aortic valve regurgitation is not visualized. Aortic valve sclerosis/calcification is present, without any evidence of aortic stenosis. Aortic valve peak gradient measures 6.8 mmHg. Pulmonic Valve: The pulmonic valve was grossly normal. Pulmonic valve regurgitation is mild. No evidence of pulmonic stenosis. Aorta: The aortic root is normal in size and structure. Venous: The inferior vena cava is normal in size with greater than 50% respiratory variability, suggesting right atrial pressure of 3 mmHg. IAS/Shunts: The interatrial septum is aneurysmal. Cannot exclude a small PFO.  LEFT VENTRICLE PLAX 2D LVIDd:         4.40 cm     Diastology LVIDs:         3.00 cm     LV e' medial:    8.16 cm/s LV PW:         1.00 cm     LV E/e' medial:  7.8 LV IVS:        1.10 cm     LV e' lateral:   10.40 cm/s LVOT diam:     1.80 cm     LV E/e' lateral: 6.1 LV SV:         56 LV SV Index:   37 LVOT Area:     2.54 cm  LV Volumes (MOD) LV vol d, MOD A2C: 81.1 ml LV vol d, MOD A4C: 80.4 ml LV vol s, MOD A2C: 30.2 ml LV vol s, MOD A4C: 30.4 ml LV SV MOD A2C:     50.9 ml LV SV MOD A4C:     80.4 ml LV SV MOD BP:      49.5 ml RIGHT VENTRICLE             IVC RV Basal diam:  3.10 cm     IVC diam: 1.80 cm RV Mid diam:    3.00 cm RV  S prime:     17.60 cm/s TAPSE (M-mode): 2.0 cm LEFT ATRIUM             Index        RIGHT ATRIUM           Index LA diam:        3.30 cm 2.20 cm/m   RA Area:     10.60 cm LA Vol (A2C):   23.9 ml 15.97 ml/m  RA Volume:   21.70 ml  14.50 ml/m LA Vol (A4C):   28.4 ml 18.98 ml/m LA Biplane Vol: 27.1 ml 18.11 ml/m  AORTIC VALVE AV Area (Vmax): 2.27 cm AV Vmax:        130.00 cm/s AV Peak Grad:    6.8 mmHg LVOT Vmax:      116.00 cm/s LVOT Vmean:     81.400 cm/s LVOT VTI:       0.219 m  AORTA Ao Root diam: 2.90 cm Ao Asc diam:  2.80 cm MITRAL VALVE               TRICUSPID VALVE MV Area (PHT): 4.57 cm    TR Peak grad:   23.4 mmHg MV Decel Time: 166 msec    TR Vmax:        242.00 cm/s MV E velocity: 63.40 cm/s MV A velocity: 86.10 cm/s  SHUNTS MV E/A ratio:  0.74        Systemic VTI:  0.22 m                            Systemic Diam: 1.80 cm Glori Bickers MD Electronically signed by Glori Bickers MD Signature Date/Time: 07/15/2021/6:28:58 PM    Final     Scheduled Meds:  busPIRone  15 mg Oral BID   enoxaparin (LOVENOX) injection  40 mg Subcutaneous Q24H   escitalopram  10 mg Oral Daily   fluticasone furoate-vilanterol  1 puff Inhalation Daily   guaiFENesin  600 mg Oral BID   hydrochlorothiazide  12.5 mg Oral Daily   ipratropium-albuterol  3 mL Nebulization TID   methylPREDNISolone (SOLU-MEDROL) injection  60 mg Intravenous Q12H   simvastatin  10 mg Oral Daily   sodium chloride flush  3 mL Intravenous Q12H   Continuous Infusions:   LOS: 2 days   Time spent: 35 minutes   Kalley Nicholl Loann Quill, MD [facility]  If 7PM-7AM, please contact night-coverage [email or web address] 07/17/2021, 12:07 PM

## 2021-07-17 NOTE — TOC Initial Note (Signed)
Transition of Care [facility]) - Initial/Assessment Note    Patient Details  Name: Tasha [name] MRN: [id number] Date of Birth: 1950-08-17  Transition of [facility]) CM/SW Contact:    Bartholome Bill, RN Phone Number: 07/17/2021, 1:50 PM  Clinical Narrative:                  TOC following for home 02 needs.        Activities of Daily Living Home Assistive Devices/Equipment: None ADL Screening (condition at time of admission) Patient's cognitive ability adequate to safely complete daily activities?: Yes Is the patient deaf or have difficulty hearing?: No Does the patient have difficulty seeing, even when wearing glasses/contacts?: No Does the patient have difficulty concentrating, remembering, or making decisions?: No Patient able to express need for assistance with ADLs?: Yes Does the patient have difficulty dressing or bathing?: No Independently performs ADLs?: Yes (appropriate for developmental age) Does the patient have difficulty walking or climbing stairs?: No Weakness of Legs: None Weakness of Arms/Hands: None       Admission diagnosis:  COPD exacerbation (HCC) [J44.1] Patient Active Problem List   Diagnosis Date Noted   COPD exacerbation (HCC) 07/14/2021   COPD with acute exacerbation (HCC) 12/10/2017   Acute on chronic respiratory failure with hypoxia (HCC) 12/10/2017   Generalized anxiety disorder 12/09/2017   COPD, group C, by GOLD 2017 classification (HCC) 08/24/2016   Nut allergy 09/03/2015   Abnormal nuclear stress test 06/10/2015   Family history of colorectal cancer 05/27/2015   Tobacco abuse 05/27/2015   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 [place] [address] Phone: [phone] Fax: [phone]  [id number] - [place], [place] - [address] [place] [address] Phone: [phone] Fax: [phone]  [id number] - [place], [place] - [address] AT [facility] [address] [place] [address] Phone: [phone] Fax: [phone]     Social Determinants of Health (SDOH) Interventions    Readmission Risk Interventions No flowsheet data found.

## 2021-07-18 DIAGNOSIS — J441 Chronic obstructive pulmonary disease with (acute) exacerbation: Secondary | ICD-10-CM | POA: Diagnosis not present

## 2021-07-18 LAB — CBC
HCT: 46.6 % — ABNORMAL HIGH (ref 36.0–46.0)
Hemoglobin: 15.1 g/dL — ABNORMAL HIGH (ref 12.0–15.0)
MCH: 30.9 pg (ref 26.0–34.0)
MCHC: 32.4 g/dL (ref 30.0–36.0)
MCV: 95.3 fL (ref 80.0–100.0)
Platelets: 289 10*3/uL (ref 150–400)
RBC: 4.89 MIL/uL (ref 3.87–5.11)
RDW: 14.4 % (ref 11.5–15.5)
WBC: 9.1 10*3/uL (ref 4.0–10.5)
nRBC: 0 % (ref 0.0–0.2)

## 2021-07-18 MED ORDER — IPRATROPIUM-ALBUTEROL 0.5-2.5 (3) MG/3ML IN SOLN
3.0000 mL | Freq: Two times a day (BID) | RESPIRATORY_TRACT | Status: DC
  Administered 2021-07-18 – 2021-07-20 (×4): 3 mL via RESPIRATORY_TRACT
  Filled 2021-07-18 (×4): qty 3

## 2021-07-18 MED ORDER — PREDNISONE 20 MG PO TABS
40.0000 mg | ORAL_TABLET | Freq: Every day | ORAL | Status: DC
  Administered 2021-07-19 – 2021-07-21 (×3): 40 mg via ORAL
  Filled 2021-07-18 (×3): qty 2

## 2021-07-18 NOTE — Progress Notes (Addendum)
PROGRESS NOTE    Tasha [name]  [id number]:[id number] DOB: January 06, 1951 DOA: 07/14/2021 PCP: Pearline Cables, MD   Brief Narrative:  Tasha [name] is a 71 y.o. female with PMH significant for COPD, chronic smoking, HTN, HLD, anxiety, depression, arthritis, GERD, DVT, scoliosis who lives at home alone. Patient was brought to the ED on 07/14/2021 for several weeks of progressively worsening shortness of breath, cough.   In the ED, patient was afebrile, hypoxic at 88% requiring 2 L oxygen nasal cannula.  Heart rate in 90s, respiratory rate in 20s, WBC count 15.3 Rest virus panel negative for flu and COVID PCR Chest x-ray did not show any abnormality VBG with normal pH and PCO2 Patient was given IV Solu-Medrol, DuoNeb, magnesium despite which she continued to be in respiratory distress and hence kept in observation to hospitalist service.  Assessment & Plan:  Acute exacerbation of COPD  Acute respiratory failure with hypoxia -History of COPD, emphysema, not on bronchodilator nebs at home.  No pulmonary follow-up.  Continues to smoke.   -Presented with progressively worsening shortness of breath and cough for several weeks.  -CXR: negative, resp panel: negative  -Still dyspneic and has diffuse wheezing on exam. -Change IV steroids to prednisone today.  Continue Breo Ellipta, DuoNeb, Mucinex, incentive spirometry -Encourage ambulation -Echo with EF 65 to 70%, G1 DD -Requiring 3 L of oxygen via nasal cannula -She needs home oxygen at the time of discharge-TOC consulted  Hypertension: Blood pressure is stable.  Continue hydrochlorothiazide 12.5 mg once daily  Hyperlipidemia: Continue statin   Anxiety/Depression - Continue home BuSpar and Lexapro  Leukocytosis: WBC 11.0.  Likely due to steroids. -Resolved   DVT prophylaxis: Lovenox Code Status: Full code Family Communication:  None present at bedside.  Plan of care discussed with patient in length and she verbalized  understanding and agreed with it. Disposition Plan: Likely home tomorrow  Consultants:  None  Procedures:  None  Antimicrobials:  None  Status is: Inpatient     Subjective: Patient seen and examined.  Sitting comfortably on the bed.  Reports some improvement in breathing.  No chest pain, wheezing.  No fever overnight.  Objective: Vitals:   07/17/21 1405 07/17/21 2055 07/18/21 0503 07/18/21 0754  BP:  127/85 (!) 153/90   Pulse:  96 (!) 102   Resp:  18 18   Temp:  99 F (37.2 C) 97.8 F (36.6 C)   TempSrc:  Oral Oral   SpO2: 93% 92% (!) 88% 93%  Weight:      Height:        Intake/Output Summary (Last 24 hours) at 07/18/2021 1455 Last data filed at 07/17/2021 2154 Gross per 24 hour  Intake 123 ml  Output --  Net 123 ml    Filed Weights   07/14/21 1436 07/14/21 2302  Weight: 50.3 kg 51 kg    Examination:  General exam: Appears calm and comfortable, on r nasal cannula, respiratory system: scant wheezing noted bilaterally.   Cardiovascular system: S1 & S2 heard, RRR. No JVD, murmurs, rubs, gallops or clicks. No pedal edema. Gastrointestinal system: Abdomen is nondistended, soft and nontender. No organomegaly or masses felt. Normal bowel sounds heard. Central nervous system: Alert and oriented. No focal neurological deficits. Extremities: Symmetric 5 x 5 power. Skin: No rashes, lesions or ulcers Psychiatry: Judgement and insight appear normal. Mood & affect appropriate.    Data Reviewed: I have personally reviewed following labs and imaging studies  CBC: Recent Labs  Lab 07/14/21 1540  07/15/21 0611 07/18/21 0742  WBC 9.6 11.0* 9.1  NEUTROABS 6.7  --   --   HGB 15.3* 13.6 15.1*  HCT 47.1* 42.1 46.6*  MCV 94.8 93.8 95.3  PLT 296 287 289    Basic Metabolic Panel: Recent Labs  Lab 07/14/21 1433 07/15/21 0611 07/16/21 0508  NA 139 138 136  K 3.7 3.7 4.1  CL 103 104 104  CO2 29 28 26   GLUCOSE 124* 144* 135*  BUN 26* 23 16  CREATININE 0.63 0.72  0.66  CALCIUM 10.0 9.2 9.2    GFR: Estimated Creatinine Clearance: 50.3 mL/min (by C-G formula based on SCr of 0.66 mg/dL). Liver Function Tests: No results for input(s): AST, ALT, ALKPHOS, BILITOT, PROT, ALBUMIN in the last 168 hours. No results for input(s): LIPASE, AMYLASE in the last 168 hours. No results for input(s): AMMONIA in the last 168 hours. Coagulation Profile: No results for input(s): INR, PROTIME in the last 168 hours. Cardiac Enzymes: No results for input(s): CKTOTAL, CKMB, CKMBINDEX, TROPONINI in the last 168 hours. BNP (last 3 results) No results for input(s): PROBNP in the last 8760 hours. HbA1C: No results for input(s): HGBA1C in the last 72 hours. CBG: No results for input(s): GLUCAP in the last 168 hours. Lipid Profile: No results for input(s): CHOL, HDL, LDLCALC, TRIG, CHOLHDL, LDLDIRECT in the last 72 hours. Thyroid Function Tests: No results for input(s): TSH, T4TOTAL, FREET4, T3FREE, THYROIDAB in the last 72 hours. Anemia Panel: No results for input(s): VITAMINB12, FOLATE, FERRITIN, TIBC, IRON, RETICCTPCT in the last 72 hours. Sepsis Labs: No results for input(s): PROCALCITON, LATICACIDVEN in the last 168 hours.  Recent Results (from the past 240 hour(s))  Resp Panel by RT-PCR (Flu A&B, Covid) Nasopharyngeal Swab     Status: None   Collection Time: 07/14/21  2:33 PM   Specimen: Nasopharyngeal Swab; Nasopharyngeal(NP) swabs in vial transport medium  Result Value Ref Range Status   SARS Coronavirus 2 by RT PCR NEGATIVE NEGATIVE Final    Comment: (NOTE) SARS-CoV-2 target nucleic acids are NOT DETECTED.  The SARS-CoV-2 RNA is generally detectable in upper respiratory specimens during the acute phase of infection. The lowest concentration of SARS-CoV-2 viral copies this assay can detect is 138 copies/mL. A negative result does not preclude SARS-Cov-2 infection and should not be used as the sole basis for treatment or other patient management decisions.  A negative result may occur with  improper specimen collection/handling, submission of specimen other than nasopharyngeal swab, presence of viral mutation(s) within the areas targeted by this assay, and inadequate number of viral copies(<138 copies/mL). A negative result must be combined with clinical observations, patient history, and epidemiological information. The expected result is Negative.  Fact Sheet for Patients:  BloggerCourse.com  Fact Sheet for Healthcare Providers:  SeriousBroker.it  This test is no t yet approved or cleared by the [place] FDA and  has been authorized for detection and/or diagnosis of SARS-CoV-2 by FDA under an Emergency Use Authorization (EUA). This EUA will remain  in effect (meaning this test can be used) for the duration of the COVID-19 declaration under Section 564(b)(1) of the Act, 21 U.S.C.section 360bbb-3(b)(1), unless the authorization is terminated  or revoked sooner.       Influenza A by PCR NEGATIVE NEGATIVE Final   Influenza B by PCR NEGATIVE NEGATIVE Final    Comment: (NOTE) The Xpert Xpress SARS-CoV-2/FLU/RSV plus assay is intended as an aid in the diagnosis of influenza from Nasopharyngeal swab specimens and should not be  used as a sole basis for treatment. Nasal washings and aspirates are unacceptable for Xpert Xpress SARS-CoV-2/FLU/RSV testing.  Fact Sheet for Patients: BloggerCourse.com  Fact Sheet for Healthcare Providers: SeriousBroker.it  This test is not yet approved or cleared by the [place] FDA and has been authorized for detection and/or diagnosis of SARS-CoV-2 by FDA under an Emergency Use Authorization (EUA). This EUA will remain in effect (meaning this test can be used) for the duration of the COVID-19 declaration under Section 564(b)(1) of the Act, 21 U.S.C. section 360bbb-3(b)(1), unless the authorization  is terminated or revoked.  Performed at [facility], [address], [place], [place] [address]   Respiratory (~20 pathogens) panel by PCR     Status: None   Collection Time: 07/14/21  6:47 PM   Specimen: Nasopharyngeal Swab; Respiratory  Result Value Ref Range Status   Adenovirus NOT DETECTED NOT DETECTED Final   Coronavirus 229E NOT DETECTED NOT DETECTED Final    Comment: (NOTE) The Coronavirus on the Respiratory Panel, DOES NOT test for the novel  Coronavirus (2019 nCoV)    Coronavirus HKU1 NOT DETECTED NOT DETECTED Final   Coronavirus NL63 NOT DETECTED NOT DETECTED Final   Coronavirus OC43 NOT DETECTED NOT DETECTED Final   Metapneumovirus NOT DETECTED NOT DETECTED Final   Rhinovirus / Enterovirus NOT DETECTED NOT DETECTED Final   Influenza A NOT DETECTED NOT DETECTED Final   Influenza B NOT DETECTED NOT DETECTED Final   Parainfluenza Virus 1 NOT DETECTED NOT DETECTED Final   Parainfluenza Virus 2 NOT DETECTED NOT DETECTED Final   Parainfluenza Virus 3 NOT DETECTED NOT DETECTED Final   Parainfluenza Virus 4 NOT DETECTED NOT DETECTED Final   Respiratory Syncytial Virus NOT DETECTED NOT DETECTED Final   Bordetella pertussis NOT DETECTED NOT DETECTED Final   Bordetella Parapertussis NOT DETECTED NOT DETECTED Final   Chlamydophila pneumoniae NOT DETECTED NOT DETECTED Final   Mycoplasma pneumoniae NOT DETECTED NOT DETECTED Final    Comment: Performed at [facility], [address] [place], [place] [address]       Radiology Studies: No results found.  Scheduled Meds:  busPIRone  15 mg Oral BID   enoxaparin (LOVENOX) injection  40 mg Subcutaneous Q24H   escitalopram  10 mg Oral Daily   fluticasone furoate-vilanterol  1 puff Inhalation Daily   guaiFENesin  600 mg Oral BID   hydrochlorothiazide  12.5 mg Oral Daily   ipratropium-albuterol  3 mL Nebulization BID   methylPREDNISolone (SOLU-MEDROL) injection  60 mg Intravenous Q12H   simvastatin  10  mg Oral Daily   sodium chloride flush  3 mL Intravenous Q12H   Continuous Infusions:   LOS: 3 days   Time spent: 35 minutes   Indi Willhite Estill Cotta, MD [facility]  If 7PM-7AM, please contact night-coverage [email or web address] 07/18/2021, 2:55 PM

## 2021-07-19 DIAGNOSIS — J441 Chronic obstructive pulmonary disease with (acute) exacerbation: Secondary | ICD-10-CM | POA: Diagnosis not present

## 2021-07-19 MED ORDER — GUAIFENESIN ER 600 MG PO TB12: 600.0000 mg | ORAL_TABLET | Freq: Two times a day (BID) | ORAL | 0 refills | Status: AC

## 2021-07-19 MED ORDER — FLUTICASONE FUROATE-VILANTEROL 200-25 MCG/ACT IN AEPB: 1.0000 | INHALATION_SPRAY | Freq: Every day | RESPIRATORY_TRACT | 0 refills | Status: DC

## 2021-07-19 MED ORDER — ALBUTEROL SULFATE HFA 108 (90 BASE) MCG/ACT IN AERS: 2.0000 | INHALATION_SPRAY | RESPIRATORY_TRACT | 0 refills | Status: AC | PRN

## 2021-07-19 MED ORDER — HYDROCHLOROTHIAZIDE 12.5 MG PO TABS: 12.5000 mg | ORAL_TABLET | Freq: Every day | ORAL | 0 refills | Status: DC

## 2021-07-19 MED ORDER — PREDNISONE 20 MG PO TABS: 20.0000 mg | ORAL_TABLET | Freq: Every day | ORAL | 0 refills | Status: DC

## 2021-07-19 NOTE — Plan of Care (Signed)
°  Problem: Education: Goal: Knowledge of disease or condition will improve Outcome: Progressing Goal: Knowledge of the prescribed therapeutic regimen will improve Outcome: Progressing Goal: Individualized Educational Video(s) Outcome: Progressing   Problem: Activity: Goal: Ability to tolerate increased activity will improve Outcome: Progressing Goal: Will verbalize the importance of balancing activity with adequate rest periods Outcome: Progressing   Problem: Respiratory: Goal: Ability to maintain a clear airway will improve Outcome: Progressing Goal: Levels of oxygenation will improve Outcome: Progressing Goal: Ability to maintain adequate ventilation will improve Outcome: Progressing   Problem: Education: Goal: Knowledge of disease or condition will improve Outcome: Progressing Goal: Knowledge of the prescribed therapeutic regimen will improve Outcome: Progressing Goal: Individualized Educational Video(s) Outcome: Progressing   Problem: Activity: Goal: Ability to tolerate increased activity will improve Outcome: Progressing Goal: Will verbalize the importance of balancing activity with adequate rest periods Outcome: Progressing   Problem: Education: Goal: Knowledge of General Education information will improve Description: Including pain rating scale, medication(s)/side effects and non-pharmacologic comfort measures Outcome: Progressing   Problem: Health Behavior/Discharge Planning: Goal: Ability to manage health-related needs will improve Outcome: Progressing   Problem: Activity: Goal: Risk for activity intolerance will decrease Outcome: Progressing   Problem: Nutrition: Goal: Adequate nutrition will be maintained Outcome: Progressing   Problem: Elimination: Goal: Will not experience complications related to bowel motility Outcome: Progressing Goal: Will not experience complications related to urinary retention Outcome: Progressing   Problem: Pain  Managment: Goal: General experience of comfort will improve Outcome: Progressing   Problem: Safety: Goal: Ability to remain free from injury will improve Outcome: Progressing   Problem: Skin Integrity: Goal: Risk for impaired skin integrity will decrease Outcome: Progressing

## 2021-07-19 NOTE — TOC Transition Note (Signed)
Transition of Care [facility]) - CM/SW Discharge Note   Patient Details  Name: Januita Nish MRN: [id number] Date of Birth: 07/29/1950  Transition of Care [facility]) CM/SW Contact:  Serenity Fortner, Marta Lamas, LCSW Phone Number: 07/19/2021, 10:54 AM   Clinical Narrative:     Patient will return home with home oxygen.  Oxygen saturation level at rest: 92%; with exertion: 87%; and on 4 liters while ambulating: 85%.  Home oxygen has been arranged through Laney Pastor with [employer] (# [phone]).  Portable oxygen tank will be delivered to patient's hospital room prior to discharge.  Final next level of care: Home/Self Care Barriers to Discharge: No Barriers Identified   Patient Goals and CMS Choice Patient states their goals for this hospitalization and ongoing recovery are: Obtain Home Oxygen CMS Medicare.gov Compare Post Acute Care list provided to: Patient Choice offered to/list presented to: NA  Discharge Placement   N/A  Discharge Plan and Services   DME Arranged: Oxygen DME Agency: [employer] Date DME Agency Contacted: 07/19/21 Time DME Agency Contacted: [phone] Representative spoke with at DME Agency: Elzie Rings - # [phone] [place] Arranged: NA  Social Determinants of Health ([place]) Interventions   N/A  Readmission Risk Interventions No flowsheet data found.

## 2021-07-19 NOTE — Discharge Summary (Signed)
Physician Discharge Summary  Tasha [name] [id number] DOB: [birth year] DOA: 07/14/2021  PCP: Darreld Mclean, MD  Admit date: 07/14/2021 Discharge date: 07/19/2021  Admitted From: Home Disposition: Home  Recommendations for Outpatient Follow-up:  Follow up with PCP in 1-2 weeks Please obtain BMP/CBC in one week Follow-up with pulmonology outpatient Take prednisone 20 mg once daily for 3 more days Inhaler as prescribed  Home Health: None Equipment/Devices: Oxygen Discharge Condition: Stable CODE STATUS: Full code Diet recommendation: Heart healthy diet  Brief/Interim Summary: Tasha [name] is a 71 y.o. female with PMH significant for COPD, chronic smoking, HTN, HLD, anxiety, depression, arthritis, GERD, DVT, scoliosis who lives at home alone. Patient was brought to the ED on 07/14/2021 for several weeks of progressively worsening shortness of breath, cough.In the ED, patient was afebrile, hypoxic at 88% requiring 2 L oxygen nasal cannula.  Heart rate in 90s, respiratory rate in 20s, WBC count 15.3. Resp. virus panel negative for flu and COVID PCR. Chest x-ray did not show any abnormality VBG with normal pH and PCO2. Patient was given IV Solu-Medrol, DuoNeb, magnesium despite which she continued to be in respiratory distress.  Hospitalist services consulted for admission for further management.  Acute exacerbation of COPD  Acute respiratory failure with hypoxia -CXR: negative, resp panel: negative  -Patient started on IV steroids, Breo Ellipta, DuoNeb, Mucinex, incentive spirometry -Echo with EF 65 to 70%, G1 DD -IV Solu-Medrol switched to p.o. prednisone on 2/18. -Her symptoms improved significantly.  Will discharge home on prednisone 20 mg for 3 more days, albuterol inhaler and Breo Ellipta.  Follow-up with PCP and pulmonology outpatient -She qualified for home oxygen-Home oxygen arranged by TOC at the time of discharge.   Hypertension: Blood pressure remained  stable.  Continued hydrochlorothiazide 12.5 mg once daily   Hyperlipidemia: Continued statin   Anxiety/Depression - Continued home BuSpar and Lexapro   Leukocytosis: WBC 11.0.  Likely due to steroids. -Resolved  Tobacco abuse: Counseled about cessation   Discharge Diagnoses:   Acute hypoxemic respiratory failure in the setting of acute exacerbation of COPD Hypertension Hyperlipidemia Anxiety and depression  leukocytosis Tobacco abuse  Discharge Instructions  Discharge Instructions     Ambulatory referral to Pulmonology   Complete by: As directed    Reason for referral: Asthma/COPD   Diet - low sodium heart healthy   Complete by: As directed    Increase activity slowly   Complete by: As directed       Allergies as of 07/19/2021       Reactions   Acitretin Swelling, Rash   Pollen Extract Shortness Of Breath   Leaves, pt. reports   Percodan [oxycodone-aspirin]    Pt does not remember   Statins Other (See Comments)   Muscle aches in legs        Medication List     STOP taking these medications    risedronate 35 MG tablet Commonly known as: ACTONEL   triamcinolone cream 0.1 % Commonly known as: KENALOG       TAKE these medications    albuterol 108 (90 Base) MCG/ACT inhaler Commonly known as: VENTOLIN HFA Inhale 2 puffs into the lungs every 4 (four) hours as needed for wheezing or shortness of breath.   aspirin EC 81 MG tablet Take 81 mg by mouth daily. Swallow whole.   busPIRone 15 MG tablet Commonly known as: BUSPAR Take 1 tablet (15 mg total) by mouth 2 (two) times daily. What changed:  when to take this reasons  to take this   escitalopram 10 MG tablet Commonly known as: LEXAPRO Take 1 tablet (10 mg total) by mouth daily.   fluticasone furoate-vilanterol 200-25 MCG/ACT Aepb Commonly known as: BREO ELLIPTA Inhale 1 puff into the lungs daily. Start taking on: July 20, 2021   guaiFENesin 600 MG 12 hr tablet Commonly known as:  MUCINEX Take 1 tablet (600 mg total) by mouth 2 (two) times daily for 7 days.   ipratropium-albuterol 0.5-2.5 (3) MG/3ML Soln Commonly known as: DUONEB INHALE VIA NEBULIZER EVERY 6 HOURS AS NEEDED   magnesium 30 MG tablet Take 30 mg by mouth daily.   Multivitamin Adults 50+ Tabs Take 1 tablet by mouth 3 (three) times a week.   predniSONE 20 MG tablet Commonly known as: DELTASONE Take 1 tablet (20 mg total) by mouth daily with breakfast for 3 days. Start taking on: July 20, 2021   PRIMATENE MIST IN Inhale 1 puff into the lungs 3 (three) times daily as needed (sob/wheezing).   simvastatin 10 MG tablet Commonly known as: ZOCOR Take 1 tablet (10 mg total) by mouth daily.               Durable Medical Equipment  (From admission, onward)           Start     Ordered   07/19/21 0945  For home use only DME oxygen  Once       Question Answer Comment  Length of Need Lifetime   Oxygen delivery system Gas      07/19/21 0944            Allergies  Allergen Reactions   Acitretin Swelling and Rash   Pollen Extract Shortness Of Breath    Leaves, pt. reports   Percodan [Oxycodone-Aspirin]     Pt does not remember   Statins Other (See Comments)    Muscle aches in legs     Consultations: None   Procedures/Studies: DG Chest Port 1 View  Result Date: 07/14/2021 CLINICAL DATA:  COPD EXAM: PORTABLE CHEST 1 VIEW COMPARISON:  None. FINDINGS: Normal mediastinum and cardiac silhouette. Normal pulmonary vasculature. No evidence of effusion, infiltrate, or pneumothorax. No acute bony abnormality. IMPRESSION: No acute cardiopulmonary process. Electronically Signed   By: Genevive Bi M.D.   On: 07/14/2021 15:24   ECHOCARDIOGRAM COMPLETE  Result Date: 07/15/2021    ECHOCARDIOGRAM REPORT   Patient Name:   Tasha [name] [facility] Date of Exam: 07/15/2021 Medical Rec #:  [id number]            Height:       62.0 in Accession #:    [id number]           Weight:        112.4 lb Date of Birth:  Oct 26, 1950            BSA:          1.497 m Patient Age:    70 years             BP:           154/79 mmHg Patient Gender: F                    HR:           100 bpm. Exam Location:  Inpatient Procedure: 2D Echo, Cardiac Doppler and Color Doppler Indications:    Dyspnea  History:        Patient has no prior history of Echocardiogram  examinations.                 COPD; Risk Factors:Hypertension.  Sonographer:    Jyl Heinz Referring Phys: [id number] [place]  1. Left ventricular ejection fraction, by estimation, is 65 to 70%. The left ventricle has normal function. The left ventricle has no regional wall motion abnormalities. Left ventricular diastolic parameters are consistent with Grade I diastolic dysfunction (impaired relaxation).  2. Right ventricular systolic function is normal. The right ventricular size is normal.  3. The mitral valve is normal in structure. Trivial mitral valve regurgitation. No evidence of mitral stenosis.  4. The aortic valve is tricuspid. There is mild calcification of the aortic valve. Aortic valve regurgitation is not visualized. Aortic valve sclerosis/calcification is present, without any evidence of aortic stenosis.  5. The inferior vena cava is normal in size with greater than 50% respiratory variability, suggesting right atrial pressure of 3 mmHg.  6. 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 Cannot exclude a small PFO. FINDINGS  Left Ventricle: Left ventricular ejection fraction, by estimation, is 65 to 70%. The left ventricle has normal function. The left ventricle has no regional wall motion abnormalities. The left ventricular internal cavity size was normal in size. There is  no left ventricular hypertrophy. Left ventricular diastolic parameters are consistent with Grade I diastolic dysfunction (impaired relaxation). Right Ventricle: The right ventricular size is normal. No increase in right ventricular wall thickness. Right ventricular systolic  function is normal. Left Atrium: Left atrial size was normal in size. Right Atrium: Right atrial size was normal in size. Pericardium: There is no evidence of pericardial effusion. Mitral Valve: The mitral valve is normal in structure. Trivial mitral valve regurgitation. No evidence of mitral valve stenosis. Tricuspid Valve: The tricuspid valve is normal in structure. Tricuspid valve regurgitation is trivial. No evidence of tricuspid stenosis. Aortic Valve: The aortic valve is tricuspid. There is mild calcification of the aortic valve. Aortic valve regurgitation is not visualized. Aortic valve sclerosis/calcification is present, without any evidence of aortic stenosis. Aortic valve peak gradient measures 6.8 mmHg. Pulmonic Valve: The pulmonic valve was grossly normal. Pulmonic valve regurgitation is mild. No evidence of pulmonic stenosis. Aorta: The aortic root is normal in size and structure. Venous: The inferior vena cava is normal in size with greater than 50% respiratory variability, suggesting right atrial pressure of 3 mmHg. IAS/Shunts: The interatrial septum is aneurysmal. Cannot exclude a small PFO.  LEFT VENTRICLE PLAX 2D LVIDd:         4.40 cm     Diastology LVIDs:         3.00 cm     LV e' medial:    8.16 cm/s LV PW:         1.00 cm     LV E/e' medial:  7.8 LV IVS:        1.10 cm     LV e' lateral:   10.40 cm/s LVOT diam:     1.80 cm     LV E/e' lateral: 6.1 LV SV:         56 LV SV Index:   37 LVOT Area:     2.54 cm  LV Volumes (MOD) LV vol d, MOD A2C: 81.1 ml LV vol d, MOD A4C: 80.4 ml LV vol s, MOD A2C: 30.2 ml LV vol s, MOD A4C: 30.4 ml LV SV MOD A2C:     50.9 ml LV SV MOD A4C:     80.4 ml LV SV  MOD BP:      49.5 ml RIGHT VENTRICLE             IVC RV Basal diam:  3.10 cm     IVC diam: 1.80 cm RV Mid diam:    3.00 cm RV S prime:     17.60 cm/s TAPSE (M-mode): 2.0 cm LEFT ATRIUM             Index        RIGHT ATRIUM           Index LA diam:        3.30 cm 2.20 cm/m   RA Area:     10.60 cm LA Vol  (A2C):   23.9 ml 15.97 ml/m  RA Volume:   21.70 ml  14.50 ml/m LA Vol (A4C):   28.4 ml 18.98 ml/m LA Biplane Vol: 27.1 ml 18.11 ml/m  AORTIC VALVE AV Area (Vmax): 2.27 cm AV Vmax:        130.00 cm/s AV Peak Grad:   6.8 mmHg LVOT Vmax:      116.00 cm/s LVOT Vmean:     81.400 cm/s LVOT VTI:       0.219 m  AORTA Ao Root diam: 2.90 cm Ao Asc diam:  2.80 cm MITRAL VALVE               TRICUSPID VALVE MV Area (PHT): 4.57 cm    TR Peak grad:   23.4 mmHg MV Decel Time: 166 msec    TR Vmax:        242.00 cm/s MV E velocity: 63.40 cm/s MV A velocity: 86.10 cm/s  SHUNTS MV E/A ratio:  0.74        Systemic VTI:  0.22 m                            Systemic Diam: 1.80 cm Glori Bickers MD Electronically signed by Glori Bickers MD Signature Date/Time: 07/15/2021/6:28:58 PM    Final       Subjective: Patient seen and examined.  Sitting comfortably on the bed.  Reports that she is feeling better.  Denies cough, congestion, shortness of breath or wheezing.  Remained afebrile.  No acute events overnight.  Comfortable going home today.  Discharge Exam: Vitals:   07/19/21 1008 07/19/21 1018  BP:    Pulse: (!) 108 92  Resp:    Temp:    SpO2: (!) 85% 92%   Vitals:   07/19/21 0758 07/19/21 1001 07/19/21 1008 07/19/21 1018  BP:      Pulse:  (!) 108 (!) 108 92  Resp:      Temp:      TempSrc:      SpO2: 93% (!) 87% (!) 85% 92%  Weight:      Height:        General: Pt is alert, awake, not in acute distress, on 4 L of oxygen via nasal cannula cardiovascular: RRR, S1/S2 +, no rubs, no gallops Respiratory: CTA bilaterally, no wheezing, no rhonchi Abdominal: Soft, NT, ND, bowel sounds + Extremities: no edema, no cyanosis    The results of significant diagnostics from this hospitalization (including imaging, microbiology, ancillary and laboratory) are listed below for reference.     Microbiology: Recent Results (from the past 240 hour(s))  Resp Panel by RT-PCR (Flu A&B, Covid) Nasopharyngeal Swab      Status: None   Collection Time: 07/14/21  2:33 PM   Specimen:  Nasopharyngeal Swab; Nasopharyngeal(NP) swabs in vial transport medium  Result Value Ref Range Status   SARS Coronavirus 2 by RT PCR NEGATIVE NEGATIVE Final    Comment: (NOTE) SARS-CoV-2 target nucleic acids are NOT DETECTED.  The SARS-CoV-2 RNA is generally detectable in upper respiratory specimens during the acute phase of infection. The lowest concentration of SARS-CoV-2 viral copies this assay can detect is 138 copies/mL. A negative result does not preclude SARS-Cov-2 infection and should not be used as the sole basis for treatment or other patient management decisions. A negative result may occur with  improper specimen collection/handling, submission of specimen other than nasopharyngeal swab, presence of viral mutation(s) within the areas targeted by this assay, and inadequate number of viral copies(<138 copies/mL). A negative result must be combined with clinical observations, patient history, and epidemiological information. The expected result is Negative.  Fact Sheet for Patients:  EntrepreneurPulse.com.au  Fact Sheet for Healthcare Providers:  IncredibleEmployment.be  This test is no t yet approved or cleared by the [place] FDA and  has been authorized for detection and/or diagnosis of SARS-CoV-2 by FDA under an Emergency Use Authorization (EUA). This EUA will remain  in effect (meaning this test can be used) for the duration of the COVID-19 declaration under Section 564(b)(1) of the Act, 21 U.S.C.section 360bbb-3(b)(1), unless the authorization is terminated  or revoked sooner.       Influenza A by PCR NEGATIVE NEGATIVE Final   Influenza B by PCR NEGATIVE NEGATIVE Final    Comment: (NOTE) The Xpert Xpress SARS-CoV-2/FLU/RSV plus assay is intended as an aid in the diagnosis of influenza from Nasopharyngeal swab specimens and should not be used as a sole basis  for treatment. Nasal washings and aspirates are unacceptable for Xpert Xpress SARS-CoV-2/FLU/RSV testing.  Fact Sheet for Patients: EntrepreneurPulse.com.au  Fact Sheet for Healthcare Providers: IncredibleEmployment.be  This test is not yet approved or cleared by the [place] FDA and has been authorized for detection and/or diagnosis of SARS-CoV-2 by FDA under an Emergency Use Authorization (EUA). This EUA will remain in effect (meaning this test can be used) for the duration of the COVID-19 declaration under Section 564(b)(1) of the Act, 21 U.S.C. section 360bbb-3(b)(1), unless the authorization is terminated or revoked.  Performed at [facility], [place] [address], [place],  [address]   Respiratory (~20 pathogens) panel by PCR     Status: None   Collection Time: 07/14/21  6:47 PM   Specimen: Nasopharyngeal Swab; Respiratory  Result Value Ref Range Status   Adenovirus NOT DETECTED NOT DETECTED Final   Coronavirus 229E NOT DETECTED NOT DETECTED Final    Comment: (NOTE) The Coronavirus on the Respiratory Panel, DOES NOT test for the novel  Coronavirus (2019 nCoV)    Coronavirus HKU1 NOT DETECTED NOT DETECTED Final   Coronavirus NL63 NOT DETECTED NOT DETECTED Final   Coronavirus OC43 NOT DETECTED NOT DETECTED Final   Metapneumovirus NOT DETECTED NOT DETECTED Final   Rhinovirus / Enterovirus NOT DETECTED NOT DETECTED Final   Influenza A NOT DETECTED NOT DETECTED Final   Influenza B NOT DETECTED NOT DETECTED Final   Parainfluenza Virus 1 NOT DETECTED NOT DETECTED Final   Parainfluenza Virus 2 NOT DETECTED NOT DETECTED Final   Parainfluenza Virus 3 NOT DETECTED NOT DETECTED Final   Parainfluenza Virus 4 NOT DETECTED NOT DETECTED Final   Respiratory Syncytial Virus NOT DETECTED NOT DETECTED Final   Bordetella pertussis NOT DETECTED NOT DETECTED Final   Bordetella Parapertussis NOT DETECTED NOT  DETECTED Final    Chlamydophila pneumoniae NOT DETECTED NOT DETECTED Final   Mycoplasma pneumoniae NOT DETECTED NOT DETECTED Final    Comment: Performed at [facility], [place] [address], [place], [place] [address]     Labs: BNP (last 3 results) No results for input(s): BNP in the last 8760 hours. Basic Metabolic Panel: Recent Labs  Lab 07/14/21 1433 07/15/21 0611 07/16/21 0508  NA 139 138 136  K 3.7 3.7 4.1  CL 103 104 104  CO2 29 28 26   GLUCOSE 124* 144* 135*  BUN 26* 23 16  CREATININE 0.63 0.72 0.66  CALCIUM 10.0 9.2 9.2   Liver Function Tests: No results for input(s): AST, ALT, ALKPHOS, BILITOT, PROT, ALBUMIN in the last 168 hours. No results for input(s): LIPASE, AMYLASE in the last 168 hours. No results for input(s): AMMONIA in the last 168 hours. CBC: Recent Labs  Lab 07/14/21 1540 07/15/21 0611 07/18/21 0742  WBC 9.6 11.0* 9.1  NEUTROABS 6.7  --   --   HGB 15.3* 13.6 15.1*  HCT 47.1* 42.1 46.6*  MCV 94.8 93.8 95.3  PLT 296 287 289   Cardiac Enzymes: No results for input(s): CKTOTAL, CKMB, CKMBINDEX, TROPONINI in the last 168 hours. BNP: Invalid input(s): POCBNP CBG: No results for input(s): GLUCAP in the last 168 hours. D-Dimer No results for input(s): DDIMER in the last 72 hours. Hgb A1c No results for input(s): HGBA1C in the last 72 hours. Lipid Profile No results for input(s): CHOL, HDL, LDLCALC, TRIG, CHOLHDL, LDLDIRECT in the last 72 hours. Thyroid function studies No results for input(s): TSH, T4TOTAL, T3FREE, THYROIDAB in the last 72 hours.  Invalid input(s): FREET3 Anemia work up No results for input(s): VITAMINB12, FOLATE, FERRITIN, TIBC, IRON, RETICCTPCT in the last 72 hours. Urinalysis    Component Value Date/Time   COLORURINE YELLOW 11/24/2013 0222   APPEARANCEUR CLEAR 11/24/2013 0222   LABSPEC 1.013 11/24/2013 0222   PHURINE 7.0 11/24/2013 0222   GLUCOSEU NEGATIVE 11/24/2013 0222   HGBUR NEGATIVE 11/24/2013 0222   HGBUR negative 10/01/2008  1054   BILIRUBINUR neg 05/02/2018 1510   KETONESUR negative 04/28/2015 0949   KETONESUR NEGATIVE 11/24/2013 0222   PROTEINUR Positive (A) 05/02/2018 1510   PROTEINUR NEGATIVE 11/24/2013 0222   UROBILINOGEN 0.2 05/02/2018 1510   UROBILINOGEN 0.2 11/24/2013 0222   NITRITE neg 05/02/2018 1510   NITRITE NEGATIVE 11/24/2013 0222   LEUKOCYTESUR Large (3+) (A) 05/02/2018 1510   Sepsis Labs Invalid input(s): PROCALCITONIN,  WBC,  LACTICIDVEN Microbiology Recent Results (from the past 240 hour(s))  Resp Panel by RT-PCR (Flu A&B, Covid) Nasopharyngeal Swab     Status: None   Collection Time: 07/14/21  2:33 PM   Specimen: Nasopharyngeal Swab; Nasopharyngeal(NP) swabs in vial transport medium  Result Value Ref Range Status   SARS Coronavirus 2 by RT PCR NEGATIVE NEGATIVE Final    Comment: (NOTE) SARS-CoV-2 target nucleic acids are NOT DETECTED.  The SARS-CoV-2 RNA is generally detectable in upper respiratory specimens during the acute phase of infection. The lowest concentration of SARS-CoV-2 viral copies this assay can detect is 138 copies/mL. A negative result does not preclude SARS-Cov-2 infection and should not be used as the sole basis for treatment or other patient management decisions. A negative result may occur with  improper specimen collection/handling, submission of specimen other than nasopharyngeal swab, presence of viral mutation(s) within the areas targeted by this assay, and inadequate number of viral copies(<138 copies/mL). A negative result must be combined with clinical  observations, patient history, and epidemiological information. The expected result is Negative.  Fact Sheet for Patients:  EntrepreneurPulse.com.au  Fact Sheet for Healthcare Providers:  IncredibleEmployment.be  This test is no t yet approved or cleared by the [place] FDA and  has been authorized for detection and/or diagnosis of SARS-CoV-2 by FDA under an  Emergency Use Authorization (EUA). This EUA will remain  in effect (meaning this test can be used) for the duration of the COVID-19 declaration under Section 564(b)(1) of the Act, 21 U.S.C.section 360bbb-3(b)(1), unless the authorization is terminated  or revoked sooner.       Influenza A by PCR NEGATIVE NEGATIVE Final   Influenza B by PCR NEGATIVE NEGATIVE Final    Comment: (NOTE) The Xpert Xpress SARS-CoV-2/FLU/RSV plus assay is intended as an aid in the diagnosis of influenza from Nasopharyngeal swab specimens and should not be used as a sole basis for treatment. Nasal washings and aspirates are unacceptable for Xpert Xpress SARS-CoV-2/FLU/RSV testing.  Fact Sheet for Patients: EntrepreneurPulse.com.au  Fact Sheet for Healthcare Providers: IncredibleEmployment.be  This test is not yet approved or cleared by the [place] FDA and has been authorized for detection and/or diagnosis of SARS-CoV-2 by FDA under an Emergency Use Authorization (EUA). This EUA will remain in effect (meaning this test can be used) for the duration of the COVID-19 declaration under Section 564(b)(1) of the Act, 21 U.S.C. section 360bbb-3(b)(1), unless the authorization is terminated or revoked.  Performed at [facility], [place] [address], [place], [place] [address]   Respiratory (~20 pathogens) panel by PCR     Status: None   Collection Time: 07/14/21  6:47 PM   Specimen: Nasopharyngeal Swab; Respiratory  Result Value Ref Range Status   Adenovirus NOT DETECTED NOT DETECTED Final   Coronavirus 229E NOT DETECTED NOT DETECTED Final    Comment: (NOTE) The Coronavirus on the Respiratory Panel, DOES NOT test for the novel  Coronavirus (2019 nCoV)    Coronavirus HKU1 NOT DETECTED NOT DETECTED Final   Coronavirus NL63 NOT DETECTED NOT DETECTED Final   Coronavirus OC43 NOT DETECTED NOT DETECTED Final   Metapneumovirus NOT DETECTED NOT DETECTED  Final   Rhinovirus / Enterovirus NOT DETECTED NOT DETECTED Final   Influenza A NOT DETECTED NOT DETECTED Final   Influenza B NOT DETECTED NOT DETECTED Final   Parainfluenza Virus 1 NOT DETECTED NOT DETECTED Final   Parainfluenza Virus 2 NOT DETECTED NOT DETECTED Final   Parainfluenza Virus 3 NOT DETECTED NOT DETECTED Final   Parainfluenza Virus 4 NOT DETECTED NOT DETECTED Final   Respiratory Syncytial Virus NOT DETECTED NOT DETECTED Final   Bordetella pertussis NOT DETECTED NOT DETECTED Final   Bordetella Parapertussis NOT DETECTED NOT DETECTED Final   Chlamydophila pneumoniae NOT DETECTED NOT DETECTED Final   Mycoplasma pneumoniae NOT DETECTED NOT DETECTED Final    Comment: Performed at [facility], [place]. [address], [place], [place] [address]     Time coordinating discharge: Over 30 minutes  SIGNED:   Mckinley Jewel, MD  [facility] 07/19/2021, 10:54 AM Pager   If 7PM-7AM, please contact night-coverage [email or web address]

## 2021-07-19 NOTE — Progress Notes (Signed)
Tasha [name] wanted to wait until her friend Tasha [name] arrived so she could be there when I explained the discharge instructions. Tasha [name] is her POA. When Tasha [name] arrived I started to explain the medication changes and Tasha [name] was so anxious she couldn't retain anything I was telling her. She kept repeating the same questions over again. I explained that the discharge papers were going home with her and she could refer to them. I asked her to read her medication list where I had typed in when her next medications were due. She started reading in the middle of the list and didn't start at the top where it began. She read the medication name then ask how she should take it. I explained to her she had to read the directions on the paper, she couldn't follow those instructions. Tasha [name] said Tasha [name] has always been independent, but she and others had notice a decline in her. Tasha [name] had run out of medication in 2020-2021 and never told anybody. She has an older brother with dementia and her sister is in her 32's in [place]. She has no other family to help her. Tasha [name] is a friend of the family and lives near Tasha [name], Tasha [name] also works. I called Dr. Jacqulyn Bath and informed her that I didn't think it would be safe for  Tasha [name] to be discharged home alone without any support. The discharged was canceled and PT and OT will evaluate the patient 07-20-21. She has 2 Oxygen tanks that were delivered to her room today. Tasha [name] [name] cell phone number is [phone]. She stated to call her if there were any questions.

## 2021-07-19 NOTE — Progress Notes (Signed)
SATURATION QUALIFICATIONS: (This note is used to comply with regulatory documentation for home oxygen)  Patient Saturations on Room Air at Rest = 92%  Patient Saturations on Room Air while Ambulating = 87%  Patient Saturations on 4 Liters of oxygen while Ambulating = 85%  Please briefly explain why patient needs home oxygen: Tasha [name] is desating while on 4L O2 via [place] while she ambulates.

## 2021-07-19 NOTE — Progress Notes (Signed)
The patient is injury-free, afebrile, alert, and oriented X 3. Vital signs were within the baseline during this shift. Pt denies chest pain, SOB, nausea, vomiting, dizziness, signs or symptoms of bleeding, or acute changes during this shift. We will continue to monitor and work toward achieving the care plan goals °

## 2021-07-20 ENCOUNTER — Inpatient Hospital Stay (HOSPITAL_COMMUNITY): Payer: Medicare Other

## 2021-07-20 DIAGNOSIS — J441 Chronic obstructive pulmonary disease with (acute) exacerbation: Secondary | ICD-10-CM | POA: Diagnosis not present

## 2021-07-20 MED ORDER — IOHEXOL 300 MG/ML  SOLN
75.0000 mL | Freq: Once | INTRAMUSCULAR | Status: AC | PRN
  Administered 2021-07-20: 75 mL via INTRAVENOUS

## 2021-07-20 MED ORDER — BUDESONIDE 0.25 MG/2ML IN SUSP
0.2500 mg | Freq: Two times a day (BID) | RESPIRATORY_TRACT | Status: DC
Start: 2021-07-20 — End: 2021-07-27
  Administered 2021-07-20 – 2021-07-27 (×15): 0.25 mg via RESPIRATORY_TRACT
  Filled 2021-07-20 (×16): qty 2

## 2021-07-20 MED ORDER — IPRATROPIUM-ALBUTEROL 0.5-2.5 (3) MG/3ML IN SOLN
3.0000 mL | Freq: Four times a day (QID) | RESPIRATORY_TRACT | Status: DC
  Administered 2021-07-20 – 2021-07-23 (×13): 3 mL via RESPIRATORY_TRACT
  Filled 2021-07-20 (×13): qty 3

## 2021-07-20 MED ORDER — GUAIFENESIN ER 600 MG PO TB12
1200.0000 mg | ORAL_TABLET | Freq: Two times a day (BID) | ORAL | Status: DC
  Administered 2021-07-20 – 2021-07-27 (×14): 1200 mg via ORAL
  Filled 2021-07-20 (×14): qty 2

## 2021-07-20 MED ORDER — HYDROXYZINE HCL 25 MG PO TABS
25.0000 mg | ORAL_TABLET | Freq: Three times a day (TID) | ORAL | Status: DC | PRN
  Administered 2021-07-20: 25 mg via ORAL
  Filled 2021-07-20: qty 1

## 2021-07-20 NOTE — Progress Notes (Signed)
Patient was not tolerating the 4L nasal cannula, she was changed to an 8L [place] with saturations =95%. RT will continue to monitor

## 2021-07-20 NOTE — Care Management Important Message (Signed)
Important Message  Patient Details IM Letter placed in Patients room. Name: Tasha [name] MRN: [id number] Date of Birth: 1951-05-15   Medicare Important Message Given:  Yes     [name] 07/20/2021, 11:40 AM

## 2021-07-20 NOTE — Evaluation (Addendum)
Occupational Therapy Evaluation Patient Details Name: Tasha [name] MRN: [id number] DOB: 05-Oct-1950 Today's Date: 07/20/2021   History of Present Illness Patient is 71 year old female who presented with increased shortness of breath. patient was admitted with COPD exacerbation and acute respiratory failure with hypoxia. PMH: COPD, asthma, depression, tobacco abuse, HTN, HLD, anxiety.   Clinical Impression   Patient is a 71 year old female who was noted to be admitted for above. Patient was living at home alone prior level. Currently, patient is on HFNC to maintain O2 during activity. Patient was noted to score 24/30 on Mini Mental State Exam which is cut off score for cognitive impairment. Patient was unable to participate in pill box test with patient filing pill box with two bottles of pills with the same 3x a day frequency when one was for 1x a day in morning. Patient needed max cues for problem solving this issue. Patient was noted to have increased SOB with O2 maintaining 92% or higher during session sitting on edge of bed. Patient declined to further participate after education on medication management was provided at this time.patient will need someone to assist with medication management in next level of care daily for accuracy with medication management. Patient would continue to benefit from skilled OT services at this time while admitted and after d/c to address noted deficits in order to improve overall safety and independence in ADLs.       Recommendations for follow up therapy are one component of a multi-disciplinary discharge planning process, led by the attending physician.  Recommendations may be updated based on patient status, additional functional criteria and insurance authorization.   Follow Up Recommendations  Home health OT (only if 24/7 care is availabe otherwise SNF)   Assistance Recommended at Discharge Frequent or constant Supervision/Assistance  Patient can  return home with the following Assistance with cooking/housework;Direct supervision/assist for financial management;Assist for transportation;Direct supervision/assist for medications management    Functional Status Assessment  Patient has had a recent decline in their functional status and demonstrates the ability to make significant improvements in function in a reasonable and predictable amount of time.  Equipment Recommendations  None recommended by OT    Recommendations for Other Services       Precautions / Restrictions Precautions Precaution Comments: monitor O2, anxiety Restrictions Weight Bearing Restrictions: No      Mobility Bed Mobility Overal bed mobility: Modified Independent             General bed mobility comments: with increased time    Transfers                          Balance Overall balance assessment: Needs assistance Sitting-balance support: No upper extremity supported, Feet supported Sitting balance-Leahy Scale: Good         Standing balance comment: unable to assess with patients increased SOB/anxiousness                           ADL either performed or assessed with clinical judgement   ADL Overall ADL's : Needs assistance/impaired Eating/Feeding: Set up;Sitting   Grooming: Wash/dry face;Oral care;Set up;Sitting   Upper Body Bathing: Set up;Sitting;Min guard   Lower Body Bathing: Min guard;Sit to/from stand;Sitting/lateral leans Lower Body Bathing Details (indicate cue type and reason): unable to assess with patients level of anxiousness at this time. Upper Body Dressing : Set up;Sitting   Lower Body Dressing:  Min guard;Sit to/from stand;Sitting/lateral leans     Toilet Transfer Details (indicate cue type and reason): unable to assess with patient getting back in bed with increased SOB with O2 maintaining in 90s                 Vision Baseline Vision/History: 1 Wears glasses Ability to See in  Adequate Light: 1 Impaired       Perception     Praxis      Pertinent Vitals/Pain Pain Assessment Pain Assessment: No/denies pain     Hand Dominance Right   Extremity/Trunk Assessment Upper Extremity Assessment Upper Extremity Assessment: Overall WFL for tasks assessed   Lower Extremity Assessment Lower Extremity Assessment: Defer to PT evaluation   Cervical / Trunk Assessment Cervical / Trunk Assessment: Other exceptions Cervical / Trunk Exceptions: chart indicated scoliosis   Communication Communication Communication: No difficulties   Cognition Arousal/Alertness: Awake/alert Behavior During Therapy: Anxious Overall Cognitive Status: Difficult to assess                                 General Comments: patient was anxious with all questions and eduaction when provided. patient had a hard time describing feelings during session. patient scored 24/30 on Mini-Mental State Examination with score being borderline for mild cog imparment and no cog imparment. patient completed portion of pill box test with patient reading the 1st prescription and filling box 3x daily for full week with no errors. the second bottle indicated " 1x a day in the morning" patient was noted to fill box 3x a day with this bottle as well. when patient was asked if she saw any errors in pill box, patient needed max cues to find the errors in box and problem solve how to fix errors. patient after this correction was able to take next pill box which was " 1x a week at bedtime" and fill box for week with no errors. patient and friend were educated on importance of medication management and risks of rehospitalization. patients friend verbalized understanding. when asked to read the perscription label for dosage, patient read allowed the whole label and then asked to clarify which line it was.     General Comments       Exercises     Shoulder Instructions      Home Living Family/patient expects  to be discharged to:: Private residence Living Arrangements: Alone   Type of Home: House       Home Layout: One level               Home Equipment: None          Prior Functioning/Environment Prior Level of Function : Independent/Modified Independent                        OT Problem List: Decreased activity tolerance;Impaired balance (sitting and/or standing);Decreased safety awareness;Cardiopulmonary status limiting activity;Decreased knowledge of use of DME or AE;Decreased knowledge of precautions;Decreased cognition      OT Treatment/Interventions: Self-care/ADL training;Therapeutic exercise;Neuromuscular education;Energy conservation;DME and/or AE instruction;Therapeutic activities;Balance training;Patient/family education    OT Goals(Current goals can be found in the care plan section) Acute Rehab OT Goals Patient Stated Goal: to get better OT Goal Formulation: With patient Time For Goal Achievement: 08/03/21 Potential to Achieve Goals: Good ADL Goals Pt Will Transfer to Toilet: with modified independence;ambulating Pt Will Perform Toileting - Clothing Manipulation and hygiene: with modified  independence;sit to/from stand;sitting/lateral leans Additional ADL Goal #1: patient to participate in pill box management tasks with no errors to return to PLOF  OT Frequency: Min 2X/week    Co-evaluation              AM-PAC OT "6 Clicks" Daily Activity     Outcome Measure Help from another person eating meals?: None Help from another person taking care of personal grooming?: None Help from another person toileting, which includes using toliet, bedpan, or urinal?: A Little Help from another person bathing (including washing, rinsing, drying)?: A Little Help from another person to put on and taking off regular upper body clothing?: A Little Help from another person to put on and taking off regular lower body clothing?: A Little 6 Click Score: 20   End of  Session Equipment Utilized During Treatment: Oxygen Nurse Communication: Other (comment) (nurse cleared patient to participate)  Activity Tolerance: Patient tolerated treatment well Patient left: in bed;with call bell/phone within reach;with family/visitor present  OT Visit Diagnosis: Other symptoms and signs involving cognitive function;Unsteadiness on feet (R26.81)                Time: 0762-2633 OT Time Calculation (min): 34 min Charges:  OT General Charges $OT Visit: 1 Visit OT Evaluation $OT Eval Moderate Complexity: 1 Mod OT Treatments $Self Care/Home Management : 8-22 mins  Sharyn Blitz OTR/L, MS Acute Rehabilitation Department Office# [phone] Pager# [phone]   Ardyth Harps 07/20/2021, 10:19 AM

## 2021-07-20 NOTE — Evaluation (Signed)
Physical Therapy Evaluation Patient Details Name: Tasha [name] MRN: [id number] DOB: 11/13/1950 Today's Date: 07/20/2021  History of Present Illness  Patient is 71 year old female who presented with increased shortness of breath. patient was admitted with COPD exacerbation and acute respiratory failure with hypoxia. PMH: COPD, asthma, depression, tobacco abuse, HTN, HLD, anxiety.  Clinical Impression  Patient resting in bed, RR rapid, patient reports recently ambulated to BR on RA, Now on 8 L HFNMC, Spo2 91%. Patient and friend state that the patient had to prop self up against wall due to SOB. Instructed and Encouraged patient to   perform pursed lip breaths. Patient  did teach back and demonstrate.  Patient declined to ambulate at this time, even with oxygen used.  Plan to ambulate tomorrow ion supplemental oxygen and  continue instruction in PLB.  Pt admitted with above diagnosis.  Pt currently with functional limitations due to the deficits listed below (see PT Problem List). Pt will benefit from skilled PT to increase their independence and safety with mobility to allow discharge to the venue listed below.        Recommendations for follow up therapy are one component of a multi-disciplinary discharge planning process, led by the attending physician.  Recommendations may be updated based on patient status, additional functional criteria and insurance authorization.  Follow Up Recommendations Home health PT    Assistance Recommended at Discharge Intermittent Supervision/Assistance  Patient can return home with the following  A little help with walking and/or transfers;Help with stairs or ramp for entrance;Assistance with cooking/housework;Assist for transportation    Equipment Recommendations  (TBD)  Recommendations for Other Services       Functional Status Assessment Patient has had a recent decline in their functional status and demonstrates the ability to make significant  improvements in function in a reasonable and predictable amount of time.     Precautions / Restrictions Precautions Precaution Comments: monitor O2, anxiety      Mobility  Bed Mobility               General bed mobility comments: patient declined, lunch tray delivered    Transfers                        Ambulation/Gait               General Gait Details: TBA  Stairs            Wheelchair Mobility    Modified Rankin (Stroke Patients Only)       Balance       Sitting balance - Comments: sits upright in bed,                                     Pertinent Vitals/Pain Pain Assessment Pain Assessment: No/denies pain    Home Living Family/patient expects to be discharged to:: Private residence Living Arrangements: Alone Available Help at Discharge: Friend(s);Available PRN/intermittently Type of Home: House         Home Layout: One level Home Equipment: None      Prior Function Prior Level of Function : Independent/Modified Independent                     Hand Dominance   Dominant Hand: Right    Extremity/Trunk Assessment   Upper Extremity Assessment Upper Extremity Assessment: Overall WFL for tasks assessed  Lower Extremity Assessment Lower Extremity Assessment: Generalized weakness       Communication   Communication: No difficulties  Cognition Arousal/Alertness: Awake/alert Behavior During Therapy: WFL for tasks assessed/performed Overall Cognitive Status: Within Functional Limits for tasks assessed                                 General Comments: patient  currently resting in bed, reports that she ambulated to BR on RA and had to lean against wall due to being SOB, friend in room  affirmed. Patient able to practice  pursed lip breaths while resting. encourage d to practice and perform when mobilizing.        General Comments      Exercises Other Exercises Other  Exercises: pursed lip breaths   Assessment/Plan    PT Assessment Patient needs continued PT services  PT Problem List Decreased mobility;Decreased safety awareness;Decreased knowledge of use of DME;Decreased activity tolerance;Cardiopulmonary status limiting activity       PT Treatment Interventions Therapeutic activities;Gait training;Therapeutic exercise;Patient/family education;Functional mobility training    PT Goals (Current goals can be found in the Care Plan section)  Acute Rehab PT Goals Patient Stated Goal: to walk ,go home PT Goal Formulation: With patient/family Time For Goal Achievement: 08/03/21 Potential to Achieve Goals: Good    Frequency Min 3X/week     Co-evaluation               AM-PAC PT "6 Clicks" Mobility  Outcome Measure Help needed turning from your back to your side while in a flat bed without using bedrails?: None Help needed moving from lying on your back to sitting on the side of a flat bed without using bedrails?: None Help needed moving to and from a bed to a chair (including a wheelchair)?: A Little Help needed standing up from a chair using your arms (e.g., wheelchair or bedside chair)?: A Little Help needed to walk in hospital room?: A Lot Help needed climbing 3-5 steps with a railing? : A Lot 6 Click Score: 18    End of Session   Activity Tolerance: Treatment limited secondary to medical complications (Comment) Patient left: in bed;with call bell/phone within reach;with family/visitor present Nurse Communication: Mobility status PT Visit Diagnosis: Difficulty in walking, not elsewhere classified (R26.2)    Time: 7654-6503 PT Time Calculation (min) (ACUTE ONLY): 10 min   Charges:   PT Evaluation $PT Eval Low Complexity: 1 Low          Blanchard Kelch PT Acute Rehabilitation Services Pager [phone] Office [phone]   Rada Hay 07/20/2021, 3:28 PM

## 2021-07-20 NOTE — Progress Notes (Signed)
PROGRESS NOTE    Tasha [name]  ZCH:[id number] DOB: 27-Jul-1950 DOA: 07/14/2021 PCP: Pearline Cables, MD   Brief Narrative:  Tasha [name] is a 71 y.o. female with PMH significant for COPD, chronic smoking, HTN, HLD, anxiety, depression, arthritis, GERD, DVT, scoliosis who lives at home alone. Patient was brought to the ED on 07/14/2021 for several weeks of progressively worsening shortness of breath, cough.  Has not improved despite steroids, breathing treatments.  Continues to have rapid breathing and needs for 4+L of O2   Assessment & Plan: Acute exacerbation of COPD  Acute respiratory failure with hypoxia -History of COPD, emphysema, not on bronchodilator nebs at home. - Continued to smoke up to this admission -Presented with progressively worsening shortness of breath and cough for several weeks.  -CXR: negative, resp panel: negative  -continue steroids - Continue Breo Ellipta, DuoNeb, Mucinex, incentive spirometry -Echo with EF 65 to 70%, G1 DD -Requiring 4 L of oxygen via nasal cannula -not improving, unclear reason- -due for CT scan of lungs-- will order  Hypertension:  -Blood pressure is stable.   Hyperlipidemia:  -Continue statin   Anxiety/Depression - Continue home BuSpar and Lexapro  Leukocytosis: WBC 11.0.  Likely due to steroids. -Resolved  ? Cognitive impairment -24/30 on the MMSE  DVT prophylaxis: Lovenox Code Status: Full code Family Communication:  MPOA at bedside Disposition Plan: CT scan of lungs pending    Status is: Inpatient     Subjective: MPOA at bedside says patient had confusion yesterday not being able to understand her d/c paperwork-- concerned as she lives alone.  Also still breathing rapidly  Objective: Vitals:   07/20/21 0445 07/20/21 0834 07/20/21 0851 07/20/21 1017  BP: (!) 147/98     Pulse: 84     Resp: 14     Temp: 97.6 F (36.4 C)     TempSrc: Oral     SpO2: 95% 90% 95% 91%  Weight:      Height:         Intake/Output Summary (Last 24 hours) at 07/20/2021 1053 Last data filed at 07/19/2021 2103 Gross per 24 hour  Intake 240 ml  Output --  Net 240 ml   Filed Weights   07/14/21 1436 07/14/21 2302  Weight: 50.3 kg 51 kg    Examination:   General: Appearance:    Well developed, well nourished female in respiratory distress     Lungs:     Diminished/tight, no wheezing heard, respirations labored  Heart:    Normal heart rate.    MS:   All extremities are intact.    Neurologic:   Awake, alert, pleasant but anxious appearing      Data Reviewed: I have personally reviewed following labs and imaging studies  CBC: Recent Labs  Lab 07/14/21 1540 07/15/21 0611 07/18/21 0742  WBC 9.6 11.0* 9.1  NEUTROABS 6.7  --   --   HGB 15.3* 13.6 15.1*  HCT 47.1* 42.1 46.6*  MCV 94.8 93.8 95.3  PLT 296 287 289   Basic Metabolic Panel: Recent Labs  Lab 07/14/21 1433 07/15/21 0611 07/16/21 0508  NA 139 138 136  K 3.7 3.7 4.1  CL 103 104 104  CO2 29 28 26   GLUCOSE 124* 144* 135*  BUN 26* 23 16  CREATININE 0.63 0.72 0.66  CALCIUM 10.0 9.2 9.2   GFR: Estimated Creatinine Clearance: 50.3 mL/min (by C-G formula based on SCr of 0.66 mg/dL). Liver Function Tests: No results for input(s): AST, ALT, ALKPHOS,  BILITOT, PROT, ALBUMIN in the last 168 hours. No results for input(s): LIPASE, AMYLASE in the last 168 hours. No results for input(s): AMMONIA in the last 168 hours. Coagulation Profile: No results for input(s): INR, PROTIME in the last 168 hours. Cardiac Enzymes: No results for input(s): CKTOTAL, CKMB, CKMBINDEX, TROPONINI in the last 168 hours. BNP (last 3 results) No results for input(s): PROBNP in the last 8760 hours. HbA1C: No results for input(s): HGBA1C in the last 72 hours. CBG: No results for input(s): GLUCAP in the last 168 hours. Lipid Profile: No results for input(s): CHOL, HDL, LDLCALC, TRIG, CHOLHDL, LDLDIRECT in the last 72 hours. Thyroid Function Tests: No  results for input(s): TSH, T4TOTAL, FREET4, T3FREE, THYROIDAB in the last 72 hours. Anemia Panel: No results for input(s): VITAMINB12, FOLATE, FERRITIN, TIBC, IRON, RETICCTPCT in the last 72 hours. Sepsis Labs: No results for input(s): PROCALCITON, LATICACIDVEN in the last 168 hours.  Recent Results (from the past 240 hour(s))  Resp Panel by RT-PCR (Flu A&B, Covid) Nasopharyngeal Swab     Status: None   Collection Time: 07/14/21  2:33 PM   Specimen: Nasopharyngeal Swab; Nasopharyngeal(NP) swabs in vial transport medium  Result Value Ref Range Status   SARS Coronavirus 2 by RT PCR NEGATIVE NEGATIVE Final    Comment: (NOTE) SARS-CoV-2 target nucleic acids are NOT DETECTED.  The SARS-CoV-2 RNA is generally detectable in upper respiratory specimens during the acute phase of infection. The lowest concentration of SARS-CoV-2 viral copies this assay can detect is 138 copies/mL. A negative result does not preclude SARS-Cov-2 infection and should not be used as the sole basis for treatment or other patient management decisions. A negative result may occur with  improper specimen collection/handling, submission of specimen other than nasopharyngeal swab, presence of viral mutation(s) within the areas targeted by this assay, and inadequate number of viral copies(<138 copies/mL). A negative result must be combined with clinical observations, patient history, and epidemiological information. The expected result is Negative.  Fact Sheet for Patients:  BloggerCourse.com  Fact Sheet for Healthcare Providers:  SeriousBroker.it  This test is no t yet approved or cleared by the [place] FDA and  has been authorized for detection and/or diagnosis of SARS-CoV-2 by FDA under an Emergency Use Authorization (EUA). This EUA will remain  in effect (meaning this test can be used) for the duration of the COVID-19 declaration under Section 564(b)(1) of  the Act, 21 U.S.C.section 360bbb-3(b)(1), unless the authorization is terminated  or revoked sooner.       Influenza A by PCR NEGATIVE NEGATIVE Final   Influenza B by PCR NEGATIVE NEGATIVE Final    Comment: (NOTE) The Xpert Xpress SARS-CoV-2/FLU/RSV plus assay is intended as an aid in the diagnosis of influenza from Nasopharyngeal swab specimens and should not be used as a sole basis for treatment. Nasal washings and aspirates are unacceptable for Xpert Xpress SARS-CoV-2/FLU/RSV testing.  Fact Sheet for Patients: BloggerCourse.com  Fact Sheet for Healthcare Providers: SeriousBroker.it  This test is not yet approved or cleared by the [place] FDA and has been authorized for detection and/or diagnosis of SARS-CoV-2 by FDA under an Emergency Use Authorization (EUA). This EUA will remain in effect (meaning this test can be used) for the duration of the COVID-19 declaration under Section 564(b)(1) of the Act, 21 U.S.C. section 360bbb-3(b)(1), unless the authorization is terminated or revoked.  Performed at [facility], [address] [place], [place] [address]   Respiratory (~20 pathogens) panel by PCR  Status: None   Collection Time: 07/14/21  6:47 PM   Specimen: Nasopharyngeal Swab; Respiratory  Result Value Ref Range Status   Adenovirus NOT DETECTED NOT DETECTED Final   Coronavirus 229E NOT DETECTED NOT DETECTED Final    Comment: (NOTE) The Coronavirus on the Respiratory Panel, DOES NOT test for the novel  Coronavirus (2019 nCoV)    Coronavirus HKU1 NOT DETECTED NOT DETECTED Final   Coronavirus NL63 NOT DETECTED NOT DETECTED Final   Coronavirus OC43 NOT DETECTED NOT DETECTED Final   Metapneumovirus NOT DETECTED NOT DETECTED Final   Rhinovirus / Enterovirus NOT DETECTED NOT DETECTED Final   Influenza A NOT DETECTED NOT DETECTED Final   Influenza B NOT DETECTED NOT DETECTED Final   Parainfluenza  Virus 1 NOT DETECTED NOT DETECTED Final   Parainfluenza Virus 2 NOT DETECTED NOT DETECTED Final   Parainfluenza Virus 3 NOT DETECTED NOT DETECTED Final   Parainfluenza Virus 4 NOT DETECTED NOT DETECTED Final   Respiratory Syncytial Virus NOT DETECTED NOT DETECTED Final   Bordetella pertussis NOT DETECTED NOT DETECTED Final   Bordetella Parapertussis NOT DETECTED NOT DETECTED Final   Chlamydophila pneumoniae NOT DETECTED NOT DETECTED Final   Mycoplasma pneumoniae NOT DETECTED NOT DETECTED Final    Comment: Performed at [facility], [address] [place], [place] [address]      Radiology Studies: No results found.  Scheduled Meds:  busPIRone  15 mg Oral BID   enoxaparin (LOVENOX) injection  40 mg Subcutaneous Q24H   escitalopram  10 mg Oral Daily   fluticasone furoate-vilanterol  1 puff Inhalation Daily   guaiFENesin  600 mg Oral BID   hydrochlorothiazide  12.5 mg Oral Daily   ipratropium-albuterol  3 mL Nebulization BID   predniSONE  40 mg Oral Q breakfast   simvastatin  10 mg Oral Daily   sodium chloride flush  3 mL Intravenous Q12H   Continuous Infusions:   LOS: 5 days   Time spent: 35 minutes   Joseph Art, DO [facility]  If 7PM-7AM, please contact night-coverage [email or web address] 07/20/2021, 10:53 AM

## 2021-07-21 DIAGNOSIS — J441 Chronic obstructive pulmonary disease with (acute) exacerbation: Secondary | ICD-10-CM | POA: Diagnosis not present

## 2021-07-21 DIAGNOSIS — J9621 Acute and chronic respiratory failure with hypoxia: Secondary | ICD-10-CM

## 2021-07-21 LAB — BLOOD GAS, ARTERIAL
Acid-Base Excess: 12.4 mmol/L — ABNORMAL HIGH (ref 0.0–2.0)
Bicarbonate: 38 mmol/L — ABNORMAL HIGH (ref 20.0–28.0)
Drawn by: 270211
FIO2: 48 %
O2 Content: 7 L/min
O2 Saturation: 96.7 %
Patient temperature: 37
pCO2 arterial: 51 mmHg — ABNORMAL HIGH (ref 32–48)
pH, Arterial: 7.48 — ABNORMAL HIGH (ref 7.35–7.45)
pO2, Arterial: 73 mmHg — ABNORMAL LOW (ref 83–108)

## 2021-07-21 LAB — TROPONIN I (HIGH SENSITIVITY): Troponin I (High Sensitivity): 4 ng/L (ref <18)

## 2021-07-21 LAB — CREATININE, SERUM
Creatinine, Ser: 0.76 mg/dL (ref 0.44–1.00)
GFR, Estimated: >60 mL/min (ref >60)

## 2021-07-21 LAB — BRAIN NATRIURETIC PEPTIDE: B Natriuretic Peptide: 28.5 pg/mL (ref 0.0–100.0)

## 2021-07-21 LAB — LACTIC ACID, PLASMA: Lactic Acid, Venous: 3.1 mmol/L (ref 0.5–1.9)

## 2021-07-21 MED ORDER — LEVALBUTEROL HCL 0.63 MG/3ML IN NEBU: 0.6300 mg | INHALATION_SOLUTION | Freq: Four times a day (QID) | RESPIRATORY_TRACT | Status: DC | PRN

## 2021-07-21 MED ORDER — METHYLPREDNISOLONE SODIUM SUCC 40 MG IJ SOLR
40.0000 mg | Freq: Two times a day (BID) | INTRAMUSCULAR | Status: DC
  Administered 2021-07-22 – 2021-07-23 (×4): 40 mg via INTRAVENOUS
  Filled 2021-07-21 (×4): qty 1

## 2021-07-21 MED ORDER — ALPRAZOLAM 0.5 MG PO TABS
0.5000 mg | ORAL_TABLET | Freq: Three times a day (TID) | ORAL | Status: DC
  Administered 2021-07-21 – 2021-07-22 (×5): 0.5 mg via ORAL
  Filled 2021-07-21 (×5): qty 1

## 2021-07-21 MED ORDER — FUROSEMIDE 10 MG/ML IJ SOLN
40.0000 mg | Freq: Once | INTRAMUSCULAR | Status: AC
  Administered 2021-07-21: 40 mg via INTRAVENOUS
  Filled 2021-07-21: qty 4

## 2021-07-21 MED ORDER — FUROSEMIDE 20 MG PO TABS
20.0000 mg | ORAL_TABLET | Freq: Every day | ORAL | Status: DC
Start: 2021-07-22 — End: 2021-07-27
  Administered 2021-07-22 – 2021-07-27 (×6): 20 mg via ORAL
  Filled 2021-07-21 (×6): qty 1

## 2021-07-21 NOTE — Consult Note (Signed)
NAME:  Tasha [name], MRN:  [id number], DOB:  1951-05-29, LOS: 6 ADMISSION DATE:  07/14/2021, CONSULTATION DATE:  07/21/21 REFERRING MD:  Dr Pietro Cassis, CHIEF COMPLAINT:  Acute on chronic resp failure   Brief History:  See below  History of Present Illness:   71 year old female with long history of smoking but quit 2 weeks prior to admission.  In addition lives alone.  [place] and sister living in [place] quite functional and drives but sedentary.  For the last few years she has dyspnea on exertion for half a flight of stairs and has to stop.  In addition appears to have failure to thrive with weight loss and cachexia.  May 2022 weight was 60.3 kg with a BMI of 25.1 [currently 51 kg with a BMI of 20].  Although she herself is reporting only weight loss only since admission.  Remote EtOH intake sober for 35 years.  Other medical issues include scoliosis, hyperlipidemia, anxiety, depression, previous history of DVT, asthma not otherwise specified, acid reflux.  However no prior oxygen use  Admitted 07/14/2021 with worsening shortness of breath insidious onset over a few to several days.  At arrival was hypoxemic at 88% requiring 2 L nasal cannula.  Noted to use accessory muscles and wheezing at admission diagnosis COPD exacerbation made and started on steroids and bronchodilators.  Course since admission -07/16/2021: 3 L oxygen at rest and 5 L with ambulation.  Unable to complete sentence.  Still on IV steroids echo with ejection fraction 65 to 70% and grade 1 diastolic dysfunction but?  PFO -07/18/2021: Still requiring 3 L nasal cannula and still with wheezing.  IV steroids changed to p.o. prednisone -07/19/2021: Pulse ox room air at rest 92% desaturated to 87% with ambulation requiring 4 L to correct with ambulation.  Discharge home held because of social status -07/20/2021: Not requiring 8 L oxygen with increased shortness of breath.  CT chest with contrast: No comment  on PE but nothing new.  Afebrile throughout since admission.  -07/21/2021: Due to high oxygen needs IV steroids restarted from p.o. prednisone.  Hydrochlorothiazide changed to IV Lasix [has been getting Lovenox throughout admission].  CCM consulted   RVP 07/14/21 - negative  Past Medical History:    has a past medical history of Abnormal nuclear stress test (06/10/2015), Allergy, Anxiety, Arthritis, Asthma, Clotting disorder ([place]), Depression, DVT (deep venous thrombosis) ([place]), GERD (gastroesophageal reflux disease), Hyperlipidemia, Hypertension, and Psoriasiform dermatitis.   reports that she has been smoking cigarettes. She has a 23.00 pack-year smoking history. She has never used smokeless tobacco.  Past Surgical History:  Procedure Laterality Date   NOSE SURGERY     TONSILLECTOMY     TUBAL LIGATION      Allergies  Allergen Reactions   Acitretin Swelling and Rash   Pollen Extract Shortness Of Breath    Leaves, pt. reports   Percodan [Oxycodone-Aspirin]     Pt does not remember   Statins Other (See Comments)    Muscle aches in legs     Immunization History  Administered Date(s) Administered   Fluad Quad(high Dose 65+) 03/10/2019   Influenza Split 04/10/2012   Influenza Whole 04/17/2008, 04/29/2009   Influenza, High Dose Seasonal PF 04/08/2016, 03/31/2017, 02/01/2018   Influenza,inj,Quad PF,6+ Mos 05/07/2014, 04/14/2015   PFIZER(Purple Top)SARS-COV-2 Vaccination 07/26/2019, 08/21/2019, 03/31/2020   Pneumococcal Conjugate-13 02/01/2018   Pneumococcal Polysaccharide-23 04/17/2008, 04/29/2009   Td 06/04/2002, 10/23/2014   Zoster, Live 05/31/2014    Family History  Problem Relation Age of Onset   Cancer Mother 30       colorectal cancer   Stroke Mother 23       CVA   Alcohol abuse Mother    Diabetes Mother    Hypertension Mother    Heart disease Father    Cancer Maternal Grandfather        lung   Alcohol abuse Brother    Heart attack Brother    Cancer Maternal  Aunt        breast   Breast cancer Maternal Aunt 75     Current Facility-Administered Medications:    acetaminophen (TYLENOL) tablet 650 mg, 650 mg, Oral, Q6H PRN **OR** acetaminophen (TYLENOL) suppository 650 mg, 650 mg, Rectal, Q6H PRN, Marcelyn Bruins, MD   ALPRAZolam Duanne Moron) tablet 0.5 mg, 0.5 mg, Oral, TID, Dahal, Binaya, MD, 0.5 mg at 07/21/21 1211   budesonide (PULMICORT) nebulizer solution 0.25 mg, 0.25 mg, Nebulization, BID, Vann, Jessica U, DO, 0.25 mg at 07/21/21 0746   busPIRone (BUSPAR) tablet 15 mg, 15 mg, Oral, BID, Marcelyn Bruins, MD, 15 mg at 07/21/21 1009   enoxaparin (LOVENOX) injection 40 mg, 40 mg, Subcutaneous, Q24H, Marcelyn Bruins, MD, 40 mg at 07/20/21 2016   escitalopram (LEXAPRO) tablet 10 mg, 10 mg, Oral, Daily, Marcelyn Bruins, MD, 10 mg at 07/21/21 1009   [START ON 07/22/2021] furosemide (LASIX) tablet 20 mg, 20 mg, Oral, Daily, Dahal, Binaya, MD   guaiFENesin (MUCINEX) 12 hr tablet 1,200 mg, 1,200 mg, Oral, BID, Vann, Jessica U, DO, 1,200 mg at 07/21/21 1009   hydrOXYzine (ATARAX) tablet 25 mg, 25 mg, Oral, TID PRN, Vann, Jessica U, DO, 25 mg at 07/20/21 2013   ipratropium-albuterol (DUONEB) 0.5-2.5 (3) MG/3ML nebulizer solution 3 mL, 3 mL, Nebulization, Q6H, Vann, Jessica U, DO, 3 mL at 07/21/21 1445   levalbuterol (XOPENEX) nebulizer solution 0.63 mg, 0.63 mg, Inhalation, Q6H PRN, Dahal, Marlowe Aschoff, MD   melatonin tablet 3 mg, 3 mg, Oral, QHS PRN, Marcelyn Bruins, MD, 3 mg at 07/20/21 2014   methylPREDNISolone sodium succinate (SOLU-MEDROL) 40 mg/mL injection 40 mg, 40 mg, Intravenous, Q12H, Dahal, Binaya, MD   polyethylene glycol (MIRALAX / GLYCOLAX) packet 17 g, 17 g, Oral, Daily PRN, Marcelyn Bruins, MD   simvastatin (ZOCOR) tablet 10 mg, 10 mg, Oral, Daily, Marcelyn Bruins, MD, 10 mg at 07/21/21 1010   sodium chloride flush (NS) 0.9 % injection 3 mL, 3 mL, Intravenous, Q12H, Marcelyn Bruins, MD, 3 mL at 07/21/21 1015    Latest  Reference Range & Units 05/11/16 13:20  IgE (Immunoglobulin E), Serum <115 kU/L 106  Allergen, D pternoyssinus,d7 kU/L 10.30 (H)  Cat Dander kU/L <0.10  Dog Dander kU/L 0.46 (H)  [place] Grass kU/L <0.10  [place] Grass kU/L <0.10  Timothy Grass kU/L <0.10  Cockroach kU/L <0.10  Aspergillus fumigatus, m3 kU/L <0.10  Allergen, Comm Silver Wendee Copp, t9 kU/L <0.10  Allergen, [place]wood, t14 kU/L <0.10  Elm IgE kU/L 0.17 (H)  Allergen, Mulberry, t76 kU/L <0.10  Allergen, Oak,t7 kU/L <0.10  Pecan/Hickory Tree IgE kU/L <0.10  Common Ragweed kU/L <0.10  Sheep Sorrel IgE kU/L <0.10  Allergen, Mouse Urine Protein, e78 kU/L <0.10  D. farinae kU/L 3.80 (H)  Allergen, Cedar tree, t12 kU/L <0.10  Box Elder IgE kU/L <0.10  Rough Pigweed  IgE kU/L <0.10  (H): Data is abnormally high  Significant Hospital Events:  07/14/2021 - admit Course since admission -07/16/2021: 3 L oxygen at  rest and 5 L with ambulation.  Unable to complete sentence.  Still on IV steroids echo with ejection fraction 65 to 70% and grade 1 diastolic dysfunction but?  PFO -07/18/2021: Still requiring 3 L nasal cannula and still with wheezing.  IV steroids changed to p.o. prednisone -07/19/2021: Pulse ox room air at rest 92% desaturated to 87% with ambulation requiring 4 L to correct with ambulation.  Discharge home held because of social status -07/20/2021: Not requiring 8 L oxygen with increased shortness of breath.  CT chest with contrast: No comment on PE but nothing new.  Afebrile throughout since admission.  -07/21/2021: Due to high oxygen needs IV steroids restarted from p.o. prednisone.  Hydrochlorothiazide changed to IV Lasix [has been getting Lovenox throughout admission].  CCM consulte   Interim History / Subjective:   07/21/2021 - seen in bed 1608 at [facility]  Objective   Blood pressure 122/79, pulse 76, temperature 98 F (36.7 C), temperature source Oral, resp. rate 18, height 5\' 2"  (1.575 m), weight 51 kg,  SpO2 93 %. Estimated body mass index is 20.56 kg/m as calculated from the following:   Height as of this encounter: 5\' 2"  (1.575 m).   Weight as of this encounter: 51 kg.         Intake/Output Summary (Last 24 hours) at 07/21/2021 1453 Last data filed at 07/21/2021 1040 Gross per 24 hour  Intake 480 ml  Output --  Net 480 ml   Filed Weights   07/14/21 1436 07/14/21 2302  Weight: 50.3 kg 51 kg    Examination: General: Thin frail female.  Cachectic.  Watching TV looks comfortable HENT: Oxygen nasal cannula present.  No neck nodes no elevated JVP Lungs: Mildly barrel chested.  No wheezing Cardiovascular: Clear to auscultation Abdomen: Soft nontender no organomegaly Extremities: No sinus no clubbing no edema Neuro: Alert and oriented x3 GU: Not examined  Resolved Hospital Problem list   x  Assessment & Plan:  ASSESSMENT / PLAN:  PULMONARY  A:  History of smoking History of COPD not otherwise specified History of failure to thrive and weight loss and cachexia -1 year prior to admission  -Typical of advanced COPD  Admitted for COPD exacerbation with acute on chronic hypoxemic respiratory failure with initial improvement but subsequent decline requiring significantly more oxygen 8 L nasal cannula  -No clear etiology identified   -Respiratory virus panel negative.  -Low odds for PE [no comment on CT chest with contrast yesterday and has been on Lovenox DVT prophylaxis all along]  -Differential diagnosis includes PFO [echo suggested that] and also diastolic dysfunction or worsening airway disease after switching to p.o. prednisone  07/21/2021 -> 8 L nasal cannula.   P:   Get ABG Echo bubble study Agree with IV steroids and aggressive bronchodilatation Agree with Lasix Check duplex lower extremity Check BNP/trop Pulse ox goal greater than 88%.   Best practice (daily eval):  Per triad      SIGNATURE    Dr. Brand Males, M.D., F.C.C.P,  Pulmonary and  Critical Care Medicine Staff Physician, [place] Director - Interstitial Lung Disease  Program  [facility] [place], [place], [address]  NPI Number:  NPI [id number]  Pager: [phone], If no answer  -> Check AMION or Try [place] Telephone (clinical office): [phone] Telephone (research): [phone]  2:53 PM 07/21/2021   07/21/2021 2:53 PM    LABS  ECHO 07/14/21  IMPRESSIONS     1. Left ventricular ejection fraction, by estimation, is 65 to 70%. The  left ventricle has normal function. The left ventricle has no regional  wall motion abnormalities. Left ventricular diastolic parameters are  consistent with Grade I diastolic  dysfunction (impaired relaxation).   2. Right ventricular systolic function is normal. The right ventricular  size is normal.   3. The mitral valve is normal in structure. Trivial mitral valve  regurgitation. No evidence of mitral stenosis.   4. The aortic valve is tricuspid. There is mild calcification of the  aortic valve. Aortic valve regurgitation is not visualized. Aortic valve  sclerosis/calcification is present, without any evidence of aortic  stenosis.   5. The inferior vena cava is normal in size with greater than 50%  respiratory variability, suggesting right atrial pressure of 3 mmHg.   6. 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 Cannot exclude a small PFO.    Latest Reference Range & Units 07/14/21 16:35  pH, Ven 7.25 - 7.43  7.39  pCO2, Ven 44 - 60 mmHg 46  pO2, Ven 32 - 45 mmHg 49.0 (H)  (H): Data is abnormally high  Latest Reference Range & Units 12/10/17 23:22  pH, Arterial 7.350 - 7.450  7.462 (H)  pCO2 arterial 32.0 - 48.0 mmHg 30.6 (L)  pO2, Arterial 83.0 - 108.0 mmHg 72.0 (L)  (H): Data is abnormally high (L): Data is abnormally low PULMONARY Recent Labs  Lab 07/14/21 1635  HCO3 27.8  O2SAT 79.8    CBC Recent Labs  Lab 07/14/21 1540  07/15/21 0611 07/18/21 0742  HGB 15.3* 13.6 15.1*  HCT 47.1* 42.1 46.6*  WBC 9.6 11.0* 9.1  PLT 296 287 289    COAGULATION No results for input(s): INR in the last 168 hours.  CARDIAC  No results for input(s): TROPONINI in the last 168 hours. No results for input(s): PROBNP in the last 168 hours.  CHEMISTRY Recent Labs  Lab 07/15/21 0611 07/16/21 0508 07/21/21 0538  NA 138 136  --   K 3.7 4.1  --   CL 104 104  --   CO2 28 26  --   GLUCOSE 144* 135*  --   BUN 23 16  --   CREATININE 0.72 0.66 0.76  CALCIUM 9.2 9.2  --    Estimated Creatinine Clearance: 50.3 mL/min (by C-G formula based on SCr of 0.76 mg/dL).   LIVER No results for input(s): AST, ALT, ALKPHOS, BILITOT, PROT, ALBUMIN, INR in the last 168 hours.   INFECTIOUS No results for input(s): LATICACIDVEN, PROCALCITON in the last 168 hours.   ENDOCRINE CBG (last 3)  No results for input(s): GLUCAP in the last 72 hours.       IMAGING x48h  - image(s) personally visualized  -   highlighted in bold CT CHEST W CONTRAST  Result Date: 07/20/2021 CLINICAL DATA:  Dyspnea, chronic, unclear etiology EXAM: CT CHEST WITH CONTRAST TECHNIQUE: Multidetector CT imaging of the chest was performed during intravenous contrast administration. RADIATION DOSE REDUCTION: This exam was performed according to the departmental dose-optimization program which includes automated exposure control, adjustment of the mA and/or kV according to patient size and/or use of iterative reconstruction technique. CONTRAST:  22mL OMNIPAQUE IOHEXOL 300 MG/ML  SOLN COMPARISON:  CT 02/12/2019 FINDINGS: Cardiovascular: Normal cardiac size.No pericardial disease.Normal size main and branch pulmonary arteries.Mild atherosclerotic calcifications of the aortic arch and descending aorta. Mediastinum/Nodes: No lymphadenopathy.The thyroid is unremarkable.Esophagus is unremarkable. Lungs/Pleura: The trachea is  unremarkable. There is mild diffuse bronchial wall  thickening. Scattered mucous plugging.There is apical predominant centrilobular emphysema. There is scarring and subsegmental atelectasis along the medial aspects of the upper lobes bilaterally.No suspicious pulmonary nodules or masses.No pleural effusion. No pneumothorax. Upper Abdomen: No acute abnormality. Small left hepatic cysts. Small bilateral renal cysts. There is a left adrenal nodule measuring up to 1.6 cm, similar in appearance to prior CT in September 2020, unchanged in size (series 2, image 137). Musculoskeletal: No acute osseous abnormality.No suspicious lytic or blastic lesions. Multilevel degenerative changes of the spine. IMPRESSION: Centrilobular emphysema with mild diffuse bronchial wall thickening and scattered mucous plugging consistent with smoking related lung disease. Medial upper lobe subsegmental atelectasis and scarring bilaterally. No suspicious pulmonary nodules or masses. Aortic Atherosclerosis (ICD10-I70.0) and Emphysema (ICD10-J43.9). Unchanged left adrenal nodule in comparison prior CT in September 2020. Electronically Signed   By: Maurine Simmering M.D.   On: 07/20/2021 11:31

## 2021-07-21 NOTE — Progress Notes (Signed)
Physical Therapy Treatment Patient Details Name: Tasha [name] MRN: [id number] DOB: 09/30/1950 Today's Date: 07/21/2021   History of Present Illness Patient is 71 year old female who presented with increased shortness of breath. patient was admitted with COPD exacerbation and acute respiratory failure with hypoxia on 07/14/21. PMH: COPD, asthma, depression, tobacco abuse, HTN, HLD, anxiety.    PT Comments    Pt making good progress but does need mod cues for safety, O2 use , and pursed lip breathing.  Ambulated 26' with min A for balance.  Could potentially benefit from use of AD for balance.  Also, received message from MD that family/friends concerned about pt's ability to manage at home alone.  Pt remains on 7 L o2 rest and 8 L O2 activity; OT performed cognitive screen that indicated deficits.  Updated recommendations to SNF.     Recommendations for follow up therapy are one component of a multi-disciplinary discharge planning process, led by the attending physician.  Recommendations may be updated based on patient status, additional functional criteria and insurance authorization.  Follow Up Recommendations  Skilled nursing-short term rehab (<3 hours/day)     Assistance Recommended at Discharge Frequent or constant Supervision/Assistance  Patient can return home with the following A little help with walking and/or transfers;Help with stairs or ramp for entrance;Assistance with cooking/housework;Assist for transportation;Direct supervision/assist for financial management;Direct supervision/assist for medications management   Equipment Recommendations  Rolling walker (2 wheels)    Recommendations for Other Services       Precautions / Restrictions Precautions Precautions: Fall Precaution Comments: monitor O2, anxiety     Mobility  Bed Mobility Overal bed mobility: Needs Assistance Bed Mobility: Supine to Sit, Sit to Supine     Supine to sit: Supervision Sit to supine:  Supervision   General bed mobility comments: Cues to keep O2 on    Transfers Overall transfer level: Needs assistance Equipment used: Rolling walker (2 wheels) Transfers: Sit to/from Stand Sit to Stand: Min assist           General transfer comment: Min A to steady; cues to maintain O2    Ambulation/Gait Ambulation/Gait assistance: Min assist Gait Distance (Feet): 70 Feet Assistive device: Rolling walker (2 wheels) Gait Pattern/deviations: Step-to pattern, Decreased stride length, Drifts right/left Gait velocity: decreased     General Gait Details: Requiring min A for balance and mod cues for pursed lip breathing and safety   Stairs             Wheelchair Mobility    Modified Rankin (Stroke Patients Only)       Balance Overall balance assessment: Needs assistance Sitting-balance support: No upper extremity supported, Feet supported Sitting balance-Leahy Scale: Good     Standing balance support: No upper extremity supported Standing balance-Leahy Scale: Fair Standing balance comment: Did not need AD but with waivering gait pattern                            Cognition Arousal/Alertness: Awake/alert Behavior During Therapy: WFL for tasks assessed/performed Overall Cognitive Status: No family/caregiver present to determine baseline cognitive functioning                                 General Comments: Unsure of baseline, does require increased cues for safety, O2 use, and pursed lip breathing.  Noting OT cognitive screen showing deficits.  Exercises      General Comments General comments (skin integrity, edema, etc.): Pt on 7 L O2 rest with sats94%; 8L to ambulate with sats91%; on HFNC; mod cues for pursed lip breathing.  Received message from MD that family/friends concerned for pt's ability to manage at home alone      Pertinent Vitals/Pain      Home Living                          Prior Function             PT Goals (current goals can now be found in the care plan section) Progress towards PT goals: Progressing toward goals    Frequency    Min 3X/week      PT Plan Discharge plan needs to be updated    Co-evaluation              AM-PAC PT "6 Clicks" Mobility   Outcome Measure  Help needed turning from your back to your side while in a flat bed without using bedrails?: None Help needed moving from lying on your back to sitting on the side of a flat bed without using bedrails?: A Little Help needed moving to and from a bed to a chair (including a wheelchair)?: A Lot (mod cues) Help needed standing up from a chair using your arms (e.g., wheelchair or bedside chair)?: A Lot Help needed to walk in hospital room?: A Lot Help needed climbing 3-5 steps with a railing? : A Lot 6 Click Score: 15    End of Session Equipment Utilized During Treatment: Gait belt Activity Tolerance: Patient tolerated treatment well;Other (comment) (pt received phone call that further limited) Patient left: in bed;with call bell/phone within reach;with bed alarm set Nurse Communication: Mobility status PT Visit Diagnosis: Difficulty in walking, not elsewhere classified (R26.2);Unsteadiness on feet (R26.81)     Time: 3710-6269 PT Time Calculation (min) (ACUTE ONLY): 12 min  Charges:  $Gait Training: 8-22 mins                     Anise Salvo, PT Acute Rehab Services Pager [phone] [facility] [phone]    Tasha [name] 07/21/2021, 5:06 PM

## 2021-07-21 NOTE — Progress Notes (Signed)
PROGRESS NOTE  Tasha [name]  DOB: [birth year]  PCP: Darreld Mclean, MD KWI:[id number]  DOA: 07/14/2021  LOS: 6 days  Hospital Day: 8  Brief narrative: Tasha [name] is a 71 y.o. female with PMH significant for COPD, chronic smoking, HTN, HLD, anxiety, depression, arthritis, GERD, DVT, scoliosis who lives at home alone. Patient was brought to the ED on 07/14/2021 for several weeks of progressively worsening shortness of breath, cough.  In the ED, patient was afebrile, hypoxic at 88% requiring 2 L oxygen nasal cannula.  Heart rate in 90s, respiratory rate in 20s, WBC count 15.3 Rest virus panel negative for flu and COVID PCR Chest x-ray did not show any abnormality VBG with normal pH and PCO2 Patient was given IV Solu-Medrol, DuoNeb, magnesium despite which she continued to be in respiratory distress and hence kept in observation to hospitalist service. See below for details  Subjective: Patient was seen and examined this morning.  I have seen her last week on 2/16.  Patient continues to require more than 6 L of oxygen even at rest. This morning I met with her friend/next of kin at bedside.  Patient still requires high flow oxygen and gets hypoxic on minimal ambulation requiring high flow. Patient and her friend believe that her anxiety is not controlled which I agree with.  Patient reports being anxious all her life.  It is amplifying her COPD symptoms as well.    Principal Problem:   COPD exacerbation ([place])   Assessment and Plan: Acute exacerbation of COPD  Acute respiratory failure with hypoxia -History of COPD, emphysema, not on bronchodilator nebs at home.  No pulmonologist as an outpatient.  Was smoking till the day of admission. -Presented with progressively worsening shortness of breath and cough for several weeks.   -For the last 1 week, patient has received IV Solu-Medrol later switched to oral prednisone..  She is also on bronchodilators.  Despite all  this, she continues to require more than 6 L oxygen even at rest. -2/20, CT chest was obtained which showed centrilobular emphysema with mild diffuse bronchial wall thickening and scattered mucus plugging consistent with smoking-related lung disease.  No evidence of pulm nodules, masses.   -This morning, I switched her back from prednisone to Solu-Medrol IV 40 mg twice daily. -Continue Pulmicort, Mucinex, incentive spirometry -I switched her from HCTZ to Lasix today hoping diuresis will help oxygenation.  IV Lasix today and oral from tomorrow. -Start bedside monitoring -I will also get pulmonary consultation. -Encourage ambulation.  Anxiety disorder -Patient reports anxiety all her life.  Currently continued on BuSpar and Lexapro from home.  -I believe her anxiety is amplifying her COPD symptoms.  I started her on Xanax 0.5 mg twice daily this morning.    Hyperlipidemia -Supposed to be but not taking statin  Left lateral nodule -2/21, CT scan showed no change in size compared to CT from 04/2019  Mobility: Encourage ambulation Goals of care   Code Status: Full Code    Nutritional status:  Body mass index is 20.56 kg/m.      Diet:  Diet Order             Diet - low sodium heart healthy           Diet Heart Room service appropriate? Yes; Fluid consistency: Thin  Diet effective now                   DVT prophylaxis:  enoxaparin (LOVENOX) injection 40  mg Start: 07/14/21 2200   Antimicrobials: None Fluid: None Consultants: Pulmonology called Family Communication: Friend/next of kin at bedside today  Status is: Inpatient  Continue in-hospital care because: Continues to remain significantly hypoxic. Level of care: Telemetry   Dispo: The patient is from: Home              Anticipated d/c is to: PT recommended home health PT.  However patient's friend/next of kin is concerned about her ability to manage herself at home as she lives alone.  She was wondering about rehab  option.  Discussed with case management.              Patient currently is not medically stable to d/c.   Difficult to place patient No     Infusions:    Scheduled Meds:  ALPRAZolam  0.5 mg Oral TID   budesonide (PULMICORT) nebulizer solution  0.25 mg Nebulization BID   busPIRone  15 mg Oral BID   enoxaparin (LOVENOX) injection  40 mg Subcutaneous Q24H   escitalopram  10 mg Oral Daily   [START ON 07/22/2021] furosemide  20 mg Oral Daily   guaiFENesin  1,200 mg Oral BID   ipratropium-albuterol  3 mL Nebulization Q6H   methylPREDNISolone (SOLU-MEDROL) injection  40 mg Intravenous Q12H   simvastatin  10 mg Oral Daily   sodium chloride flush  3 mL Intravenous Q12H    PRN meds: acetaminophen **OR** acetaminophen, hydrOXYzine, levalbuterol, melatonin, polyethylene glycol   Antimicrobials: Anti-infectives (From admission, onward)    None       Objective: Vitals:   07/21/21 0504 07/21/21 0750  BP: 122/79   Pulse: 76   Resp: 18   Temp: 98 F (36.7 C)   SpO2: 100% 99%    Intake/Output Summary (Last 24 hours) at 07/21/2021 1107 Last data filed at 07/20/2021 2019 Gross per 24 hour  Intake 240 ml  Output --  Net 240 ml   Filed Weights   07/14/21 1436 07/14/21 2302  Weight: 50.3 kg 51 kg   Weight change:  Body mass index is 20.56 kg/m.   Physical Exam: General exam: Pleasant, elderly Caucasian female.  Mild to moderate respiratory distress Skin: No rashes, lesions or ulcers. HEENT: Atraumatic, normocephalic, no obvious bleeding Lungs: Diminished air entry in both bases.  Continues to have diffuse scattered wheezing.  Coughs on deep breathing CVS: Regular rate and rhythm, no murmur GI/Abd soft, nontender, nondistended, bowel sound present CNS: Alert, awake, oriented x3 Psychiatry: anxious Extremities: No pedal edema, no calf tenderness  Data Review: I have personally reviewed the laboratory data and studies available.  F/u labs ordered Unresulted Labs (From  admission, onward)     Start     Ordered   07/21/21 0500  Creatinine, serum  (enoxaparin (LOVENOX)    CrCl >/= 30 ml/min)  Weekly,   R     Comments: while on enoxaparin therapy    07/14/21 1846   07/14/21 1433  CBC with Differential  Once,   STAT        07/14/21 1432            Signed, Terrilee Croak, MD [facility] 07/21/2021

## 2021-07-22 ENCOUNTER — Inpatient Hospital Stay (HOSPITAL_COMMUNITY): Payer: Medicare Other

## 2021-07-22 DIAGNOSIS — J441 Chronic obstructive pulmonary disease with (acute) exacerbation: Secondary | ICD-10-CM | POA: Diagnosis not present

## 2021-07-22 DIAGNOSIS — J9622 Acute and chronic respiratory failure with hypercapnia: Secondary | ICD-10-CM | POA: Diagnosis not present

## 2021-07-22 DIAGNOSIS — E278 Other specified disorders of adrenal gland: Secondary | ICD-10-CM

## 2021-07-22 DIAGNOSIS — J9621 Acute and chronic respiratory failure with hypoxia: Secondary | ICD-10-CM | POA: Diagnosis not present

## 2021-07-22 DIAGNOSIS — J9601 Acute respiratory failure with hypoxia: Secondary | ICD-10-CM

## 2021-07-22 LAB — BASIC METABOLIC PANEL
Anion gap: 7 (ref 5–15)
BUN: 37 mg/dL — ABNORMAL HIGH (ref 8–23)
CO2: 34 mmol/L — ABNORMAL HIGH (ref 22–32)
Calcium: 9.9 mg/dL (ref 8.9–10.3)
Chloride: 95 mmol/L — ABNORMAL LOW (ref 98–111)
Creatinine, Ser: 0.82 mg/dL (ref 0.44–1.00)
GFR, Estimated: >60 mL/min (ref >60)
Glucose, Bld: 158 mg/dL — ABNORMAL HIGH (ref 70–99)
Potassium: 4.2 mmol/L (ref 3.5–5.1)
Sodium: 136 mmol/L (ref 135–145)

## 2021-07-22 LAB — ECHOCARDIOGRAM COMPLETE BUBBLE STUDY
AR max vel: 3.04 cm2
AV Peak grad: 4 mmHg
Ao pk vel: 1 m/s
Area-P 1/2: 3.31 cm2
S' Lateral: 1.8 cm

## 2021-07-22 LAB — CBC
HCT: 48.7 % — ABNORMAL HIGH (ref 36.0–46.0)
Hemoglobin: 16.1 g/dL — ABNORMAL HIGH (ref 12.0–15.0)
MCH: 31.1 pg (ref 26.0–34.0)
MCHC: 33.1 g/dL (ref 30.0–36.0)
MCV: 94.2 fL (ref 80.0–100.0)
Platelets: 332 10*3/uL (ref 150–400)
RBC: 5.17 MIL/uL — ABNORMAL HIGH (ref 3.87–5.11)
RDW: 13.9 % (ref 11.5–15.5)
WBC: 14.4 10*3/uL — ABNORMAL HIGH (ref 4.0–10.5)
nRBC: 0 % (ref 0.0–0.2)

## 2021-07-22 LAB — TROPONIN I (HIGH SENSITIVITY): Troponin I (High Sensitivity): 5 ng/L (ref <18)

## 2021-07-22 LAB — STREP PNEUMONIAE URINARY ANTIGEN: Strep Pneumo Urinary Antigen: NEGATIVE

## 2021-07-22 MED ORDER — ALPRAZOLAM 0.5 MG PO TABS
0.5000 mg | ORAL_TABLET | Freq: Two times a day (BID) | ORAL | Status: DC | PRN
  Administered 2021-07-22 – 2021-07-27 (×5): 0.5 mg via ORAL
  Filled 2021-07-22 (×5): qty 1

## 2021-07-22 NOTE — Progress Notes (Signed)
NAME:  Tasha [name], MRN:  [id number], DOB:  1951/04/27, LOS: 7 ADMISSION DATE:  07/14/2021, CONSULTATION DATE:  07/21/21 REFERRING MD:  Dr Pietro Cassis, CHIEF COMPLAINT:  Acute on chronic resp failure   Brief History:  Admitted 07/14/2021 with worsening shortness of breath insidious onset over a few to several days.  At arrival was hypoxemic at 88% requiring 2 L nasal cannula.  Noted to use accessory muscles and wheezing at admission diagnosis COPD exacerbation and started on steroids and bronchodilators.  Past Medical History:  HLD HTN GERD Prior DVT Depression Allergies Arthritis   Significant Hospital Events:  07/14/2021 - admit 07/16/2021: 3 L oxygen at rest and 5 L with ambulation.  Unable to complete sentence.  Still on IV steroids echo with ejection fraction 65 to 70% and grade 1 diastolic dysfunction but?  PFO 07/18/2021: Still requiring 3 L nasal cannula and still with wheezing.  IV steroids changed to p.o. prednisone 07/19/2021: Pulse ox room air at rest 92% desaturated to 87% with ambulation requiring 4 L to correct with ambulation.  Discharge home held because of social status 07/20/2021: Now requiring 8 L oxygen with increased shortness of breath.  CT chest with contrast with no comment on PE but nothing new. Afebrile throughout since admission. 07/21/2021: Due to high oxygen needs IV steroids restarted from p.o. prednisone.  Hydrochlorothiazide changed to IV Lasix [has been getting Lovenox throughout admission].  CCM consulted 2/22 Oxygen wean back down to 4L [name],  Interim History / Subjective:  States she feels well with no acute complaints   Objective   Blood pressure 120/74, pulse 76, temperature 98.3 F (36.8 C), temperature source Oral, resp. rate 18, height 5\' 2"  (1.575 m), weight 51 kg, SpO2 (!) 88 %. Estimated body mass index is 20.56 kg/m as calculated from the following:   Height as of this encounter: 5\' 2"  (1.575 m).   Weight as of this encounter: 51 kg.          Intake/Output Summary (Last 24 hours) at 07/22/2021 [id number] Last data filed at 07/21/2021 2107 Gross per 24 hour  Intake 1000 ml  Output --  Net 1000 ml    Filed Weights   07/14/21 1436 07/14/21 2302  Weight: 50.3 kg 51 kg    Examination: General: Acute on chronically ill appearing elderly female lying in bed in NAD HEENT: [place]/AT, MM pink/moist, PERRL,  Neuro: Alert and oriented x3, questions some underlying confusion  CV: s1s2 regular rate and rhythm, no murmur, rubs, or gallops,  PULM:  Clear to ascultation  GI: soft, bowel sounds active in all 4 quadrants, non-tender, non-distended, tolerating oral diet Extremities: warm/dry, no edema  Skin: no rashes or lesions  Resolved Hospital Problem list   x  Assessment & Plan:   COPD exacerbation with acute on chronic hypoxemic respiratory failure  -Initially saw partial improvement but subsequent decline requiring significantly more oxygen 8 L nasal cannula -RVP panel negative  -Low odds for PE [no comment on CT chest with contrast yesterday and has been on Lovenox DVT prophylaxis all along -Differential diagnosis includes PFO [echo suggested that] and also diastolic dysfunction or worsening airway disease after switching to p.o. prednisone History of smoking History of COPD not otherwise specified History of failure to thrive and weight loss and cachexia -1 year prior to admission P:   ECHO bubble study pending  Continue IV steroids, patient reports improvement when restarted  Aggressive BDs  Diurese as able  Lower extremity doppler pending  Continue  to wean supplemental oxygen for sats greater than 88  Best practice (daily eval):  Per [facility]  SIGNATURE  [name] D. Kenton Kingfisher, NP-C [facility] Personal contact information can be found on Amion  07/22/2021, 10:09 AM

## 2021-07-22 NOTE — NC FL2 (Signed)
[place] MEDICAID FL2 LEVEL OF CARE SCREENING TOOL     IDENTIFICATION  Patient Name: Tasha [name] Birthdate: 1950/07/01 Sex: female Admission Date (Current Location): 07/14/2021  [facility] and [place] Number:  Producer, television/film/video and Address:  [facility],  [address] [place], [place] [address]      Provider Number: [id number]  Attending Physician Name and Address:  Zigmund Daniel., *  Relative Name and Phone Number:       Current Level of Care: Hospital Recommended Level of Care: Skilled Nursing Facility Prior Approval Number:    Date Approved/Denied:   PASRR Number: Pending  Discharge Plan: SNF    Current Diagnoses: Patient Active Problem List   Diagnosis Date Noted   COPD exacerbation (HCC) 07/14/2021   COPD with acute exacerbation (HCC) 12/10/2017   Acute on chronic respiratory failure with hypoxia (HCC) 12/10/2017   Generalized anxiety disorder 12/09/2017   COPD, group C, by GOLD 2017 classification (HCC) 08/24/2016   Nut allergy 09/03/2015   Abnormal nuclear stress test 06/10/2015   Family history of colorectal cancer 05/27/2015   Tobacco abuse 05/27/2015   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) 04/28/2015   Essential hypertension 04/24/2014   DVT (deep venous thrombosis) (HCC) 03/10/2012   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   Pre-diabetes 02/10/2009   ENDOMETRIAL POLYP 10/01/2008   Hypercholesteremia 04/17/2008   DEPRESSION 04/17/2008   ASTHMA 04/17/2008   SCOLIOSIS 04/17/2008   COLONIC POLYPS, HX OF 04/17/2008    Orientation RESPIRATION BLADDER Height & Weight     Self, Time, Situation, Place  O2 Continent Weight: 51 kg Height:  5\' 2"  (157.5 cm)  BEHAVIORAL SYMPTOMS/MOOD NEUROLOGICAL BOWEL NUTRITION STATUS      Continent Diet (Heart Healthy)  AMBULATORY STATUS COMMUNICATION OF NEEDS Skin   Limited Assist Verbally Normal                       Personal Care Assistance Level of Assistance  Bathing, Feeding, Dressing Bathing  Assistance: Limited assistance Feeding assistance: Independent Dressing Assistance: Limited assistance     Functional Limitations Info  Sight, Hearing, Speech Sight Info: Impaired Hearing Info: Adequate Speech Info: Adequate    SPECIAL CARE FACTORS FREQUENCY  PT (By licensed PT), OT (By licensed OT)     PT Frequency: 5 x weekly OT Frequency: 5 x weekly            Contractures Contractures Info: Not present    Additional Factors Info  Code Status, Allergies Code Status Info: Full Allergies Info: Acitretin, Pollen Extract, Percodan (Oxycodone-aspirin), Statins           Current Medications (07/22/2021):  This is the current hospital active medication list Current Facility-Administered Medications  Medication Dose Route Frequency Provider Last Rate Last Admin   acetaminophen (TYLENOL) tablet 650 mg  650 mg Oral Q6H PRN 07/24/2021, MD       Or   acetaminophen (TYLENOL) suppository 650 mg  650 mg Rectal Q6H PRN Synetta Fail, MD       ALPRAZolam Synetta Fail) tablet 0.5 mg  0.5 mg Oral TID Prudy Feeler, MD   0.5 mg at 07/22/21 1036   budesonide (PULMICORT) nebulizer solution 0.25 mg  0.25 mg Nebulization BID Vann, Jessica U, DO   0.25 mg at 07/22/21 0737   busPIRone (BUSPAR) tablet 15 mg  15 mg Oral BID 07/24/21, MD   15 mg at 07/22/21 1036   enoxaparin (LOVENOX) injection 40 mg  40 mg  Subcutaneous Q24H Synetta Fail, MD   40 mg at 07/21/21 2106   escitalopram (LEXAPRO) tablet 10 mg  10 mg Oral Daily Synetta Fail, MD   10 mg at 07/22/21 1036   furosemide (LASIX) tablet 20 mg  20 mg Oral Daily Dahal, Melina Schools, MD   20 mg at 07/22/21 1036   guaiFENesin (MUCINEX) 12 hr tablet 1,200 mg  1,200 mg Oral BID Vann, Jessica U, DO   1,200 mg at 07/22/21 1036   hydrOXYzine (ATARAX) tablet 25 mg  25 mg Oral TID PRN Marlin Canary U, DO   25 mg at 07/20/21 2013   ipratropium-albuterol (DUONEB) 0.5-2.5 (3) MG/3ML nebulizer solution 3 mL  3 mL Nebulization Q6H  Vann, Jessica U, DO   3 mL at 07/22/21 1453   levalbuterol (XOPENEX) nebulizer solution 0.63 mg  0.63 mg Inhalation Q6H PRN Dahal, Melina Schools, MD       melatonin tablet 3 mg  3 mg Oral QHS PRN Synetta Fail, MD   3 mg at 07/21/21 2106   methylPREDNISolone sodium succinate (SOLU-MEDROL) 40 mg/mL injection 40 mg  40 mg Intravenous Q12H Dahal, Melina Schools, MD   40 mg at 07/22/21 1300   polyethylene glycol (MIRALAX / GLYCOLAX) packet 17 g  17 g Oral Daily PRN Synetta Fail, MD       simvastatin (ZOCOR) tablet 10 mg  10 mg Oral Daily Synetta Fail, MD   10 mg at 07/22/21 1036   sodium chloride flush (NS) 0.9 % injection 3 mL  3 mL Intravenous Q12H Synetta Fail, MD   3 mL at 07/22/21 1038     Discharge Medications: Please see discharge summary for a list of discharge medications.  Relevant Imaging Results:  Relevant Lab Results:   Additional Information SS# [id number]  Pf[employer] covid vacines x3  Jenyfer Trawick, Meriam Sprague, RN

## 2021-07-22 NOTE — Progress Notes (Signed)
PROGRESS NOTE    Tasha [name]  [id number] DOB: August 09, 1950 DOA: 07/14/2021 PCP: Darreld Mclean, MD  No chief complaint on file.   Brief Narrative:  Tasha [name] is Tasha [name] 71 y.o. female with PMH significant for COPD, chronic smoking, HTN, HLD, anxiety, depression, arthritis, GERD, DVT, scoliosis who lives at home alone. Patient was brought to the ED on 07/14/2021 for several weeks of progressively worsening shortness of breath, cough.   In the ED, patient was afebrile, hypoxic at 88% requiring 2 L oxygen nasal cannula.  Heart rate in 90s, respiratory rate in 20s, WBC count 15.3 Rest virus panel negative for flu and COVID PCR Chest x-ray did not show any abnormality VBG with normal pH and PCO2 Patient was given IV Solu-Medrol, DuoNeb, magnesium despite which she continued to be in respiratory distress and hence kept in observation to hospitalist service. See below for details    Assessment & Plan:   Principal Problem:   COPD exacerbation ([place]) Active Problems:   Acute on chronic respiratory failure with hypoxia and hypercapnia (HCC)   Anxiety and depression   Hypercholesteremia   Adrenal nodule (HCC)   Assessment and Plan: Acute on chronic respiratory failure with hypoxia and hypercapnia (HCC) Hx COPD, emphysema, was not on nebs at home and no pulmonologist Smoking up until admission Currently requiring 6 L [place] (required as much as 8 L during this hospitalization) CT 2/20 with centrilobular emphysema with mild diffuse bronchial wall thickening and scattered mucous plugging c/w smoking related lung disease Currently on IV solumedrol, scheduled and prn nebs Will continue diuresis as tolerated, doesn't appear overtly overloaded pulm consult, appreciate assistance Lactate pending, nocturnal NIPPV Echo with EF Q000111Q, grade 1 diastolic dysfunction, negative bubble study Negative LE [place]        Anxiety and depression- (present on admission) Buspar, lexapro,  atarax  Hypercholesteremia- (present on admission) simvastatin  Adrenal nodule ([place]) Noted on CT scan on L Follow with PCP outpatient   DVT prophylaxis: lovenox Code Status: full Family Communication: none Disposition:   Status is: Inpatient Remains inpatient appropriate because: need for weaning of o2, treating and w/u of acute hypoxic resp failure   Consultants:  Pulm  Procedures:  Echo IMPRESSIONS     1. Left ventricular ejection fraction, by estimation, is 65 to 70%. The  left ventricle has normal function. The left ventricle has no regional  wall motion abnormalities. Left ventricular diastolic parameters are  consistent with Grade I diastolic  dysfunction (impaired relaxation).   2. Right ventricular systolic function is normal. The right ventricular  size is normal.   3. The mitral valve is normal in structure. No evidence of mitral valve  regurgitation. No evidence of mitral stenosis.   4. The aortic valve is tricuspid. There is mild calcification of the  aortic valve. Aortic valve regurgitation is not visualized. Aortic valve  sclerosis is present, with no evidence of aortic valve stenosis.   5. The inferior vena cava is normal in size with greater than 50%  respiratory variability, suggesting right atrial pressure of 3 mmHg.   6. Agitated saline contrast bubble study was negative, with no evidence  of any interatrial shunt.   Comparison(s): No significant change from prior study. Prior images  reviewed side by side.   LE [place]  Summary:  BILATERAL:  - No evidence of deep vein thrombosis seen in the lower extremities,  bilaterally.  - No evidence of superficial venous thrombosis in the lower extremities,  bilaterally.  -  No evidence of popliteal cyst, bilaterally.   Antimicrobials:  Anti-infectives (From admission, onward)    None       Subjective: No new complaints  Objective: Vitals:   07/22/21 0440 07/22/21 0737 07/22/21 0740 07/22/21 1309   BP: 120/74   126/76  Pulse: 76   100  Resp: 18   18  Temp: 98.3 F (36.8 C)   98 F (36.7 C)  TempSrc: Oral   Oral  SpO2: 99% (!) 88% (!) 88% 93%  Weight:      Height:        Intake/Output Summary (Last 24 hours) at 07/22/2021 1708 Last data filed at 07/21/2021 2107 Gross per 24 hour  Intake 520 ml  Output --  Net 520 ml   Filed Weights   07/14/21 1436 07/14/21 2302  Weight: 50.3 kg 51 kg    Examination:  General exam: Appears calm and comfortable  Respiratory system: Clear to auscultation. Respiratory effort normal. Cardiovascular system: RRR Gastrointestinal system: Abdomen is nondistended, soft and nontender.  Central nervous system: Alert and oriented. No focal neurological deficits. Extremities: no LEE Skin: No rashes, lesions or ulcers Psychiatry: Judgement and insight appear normal. Mood & affect appropriate.     Data Reviewed: I have personally reviewed following labs and imaging studies  CBC: Recent Labs  Lab 07/18/21 0742 07/22/21 1313  WBC 9.1 14.4*  HGB 15.1* 16.1*  HCT 46.6* 48.7*  MCV 95.3 94.2  PLT 289 332    Basic Metabolic Panel: Recent Labs  Lab 07/16/21 0508 07/21/21 0538 07/22/21 1313  NA 136  --  136  K 4.1  --  4.2  CL 104  --  95*  CO2 26  --  34*  GLUCOSE 135*  --  158*  BUN 16  --  37*  CREATININE 0.66 0.76 0.82  CALCIUM 9.2  --  9.9    GFR: Estimated Creatinine Clearance: 49 mL/min (by C-G formula based on SCr of 0.82 mg/dL).  Liver Function Tests: No results for input(s): AST, ALT, ALKPHOS, BILITOT, PROT, ALBUMIN in the last 168 hours.  CBG: No results for input(s): GLUCAP in the last 168 hours.   Recent Results (from the past 240 hour(s))  Resp Panel by RT-PCR (Flu Roxana Lai&B, Covid) Nasopharyngeal Swab     Status: None   Collection Time: 07/14/21  2:33 PM   Specimen: Nasopharyngeal Swab; Nasopharyngeal(NP) swabs in vial transport medium  Result Value Ref Range Status   SARS Coronavirus 2 by RT PCR NEGATIVE  NEGATIVE Final    Comment: (NOTE) SARS-CoV-2 target nucleic acids are NOT DETECTED.  The SARS-CoV-2 RNA is generally detectable in upper respiratory specimens during the acute phase of infection. The lowest concentration of SARS-CoV-2 viral copies this assay can detect is 138 copies/mL. Tasha [name] negative result does not preclude SARS-Cov-2 infection and should not be used as the sole basis for treatment or other patient management decisions. Tasha [name] negative result may occur with  improper specimen collection/handling, submission of specimen other than nasopharyngeal swab, presence of viral mutation(s) within the areas targeted by this assay, and inadequate number of viral copies(<138 copies/mL). Tasha [name] negative result must be combined with clinical observations, patient history, and epidemiological information. The expected result is Negative.  Fact Sheet for Patients:  BloggerCourse.com  Fact Sheet for Healthcare Providers:  SeriousBroker.it  This test is no t yet approved or cleared by the [place] FDA and  has been authorized for detection and/or diagnosis of SARS-CoV-2 by FDA under an Emergency  Use Authorization (EUA). This EUA will remain  in effect (meaning this test can be used) for the duration of the COVID-19 declaration under Section 564(b)(1) of the Act, 21 U.S.C.section 360bbb-3(b)(1), unless the authorization is terminated  or revoked sooner.       Influenza Tasha [name] by PCR NEGATIVE NEGATIVE Final   Influenza B by PCR NEGATIVE NEGATIVE Final    Comment: (NOTE) The Xpert Xpress SARS-CoV-2/FLU/RSV plus assay is intended as an aid in the diagnosis of influenza from Nasopharyngeal swab specimens and should not be used as Carollee Nussbaumer sole basis for treatment. Nasal washings and aspirates are unacceptable for Xpert Xpress SARS-CoV-2/FLU/RSV testing.  Fact Sheet for Patients: EntrepreneurPulse.com.au  Fact Sheet for Healthcare  Providers: IncredibleEmployment.be  This test is not yet approved or cleared by the [place] FDA and has been authorized for detection and/or diagnosis of SARS-CoV-2 by FDA under an Emergency Use Authorization (EUA). This EUA will remain in effect (meaning this test can be used) for the duration of the COVID-19 declaration under Section 564(b)(1) of the Act, 21 U.S.C. section 360bbb-3(b)(1), unless the authorization is terminated or revoked.  Performed at [facility], [place] [address] [place], [place] [address]   Respiratory (~20 pathogens) panel by PCR     Status: None   Collection Time: 07/14/21  6:47 PM   Specimen: Nasopharyngeal Swab; Respiratory  Result Value Ref Range Status   Adenovirus NOT DETECTED NOT DETECTED Final   Coronavirus 229E NOT DETECTED NOT DETECTED Final    Comment: (NOTE) The Coronavirus on the Respiratory Panel, DOES NOT test for the novel  Coronavirus (2019 nCoV)    Coronavirus HKU1 NOT DETECTED NOT DETECTED Final   Coronavirus NL63 NOT DETECTED NOT DETECTED Final   Coronavirus OC43 NOT DETECTED NOT DETECTED Final   Metapneumovirus NOT DETECTED NOT DETECTED Final   Rhinovirus / Enterovirus NOT DETECTED NOT DETECTED Final   Influenza Dequincy Born NOT DETECTED NOT DETECTED Final   Influenza B NOT DETECTED NOT DETECTED Final   Parainfluenza Virus 1 NOT DETECTED NOT DETECTED Final   Parainfluenza Virus 2 NOT DETECTED NOT DETECTED Final   Parainfluenza Virus 3 NOT DETECTED NOT DETECTED Final   Parainfluenza Virus 4 NOT DETECTED NOT DETECTED Final   Respiratory Syncytial Virus NOT DETECTED NOT DETECTED Final   Bordetella pertussis NOT DETECTED NOT DETECTED Final   Bordetella Parapertussis NOT DETECTED NOT DETECTED Final   Chlamydophila pneumoniae NOT DETECTED NOT DETECTED Final   Mycoplasma pneumoniae NOT DETECTED NOT DETECTED Final    Comment: Performed at [facility], [place]. [address] [place],  [address]          Radiology Studies: ECHOCARDIOGRAM COMPLETE BUBBLE STUDY  Result Date: 07/22/2021    ECHOCARDIOGRAM REPORT   Patient Name:   MERAV BURCHILL [facility] Date of Exam: 07/22/2021 Medical Rec #:  [id number]            Height:       62.0 in Accession #:    [id number]           Weight:       112.4 lb Date of Birth:  1951/03/07            BSA:          1.497 m Patient Age:    62 years             BP:           00/00 mmHg Patient Gender: F  HR:           93 bpm. Exam Location:  Inpatient Procedure: 2D Echo Indications:    PFO  History:        Patient has prior history of Echocardiogram examinations, most                 recent 07/15/2021. COPD.  Sonographer:    Jefferey Pica Referring Phys: [phone] MURALI RAMASWAMY  Sonographer Comments: Image acquisition challenging due to respiratory motion. IMPRESSIONS  1. Left ventricular ejection fraction, by estimation, is 65 to 70%. The left ventricle has normal function. The left ventricle has no regional wall motion abnormalities. Left ventricular diastolic parameters are consistent with Grade I diastolic dysfunction (impaired relaxation).  2. Right ventricular systolic function is normal. The right ventricular size is normal.  3. The mitral valve is normal in structure. No evidence of mitral valve regurgitation. No evidence of mitral stenosis.  4. The aortic valve is tricuspid. There is mild calcification of the aortic valve. Aortic valve regurgitation is not visualized. Aortic valve sclerosis is present, with no evidence of aortic valve stenosis.  5. The inferior vena cava is normal in size with greater than 50% respiratory variability, suggesting right atrial pressure of 3 mmHg.  6. Agitated saline contrast bubble study was negative, with no evidence of any interatrial shunt. Comparison(s): No significant change from prior study. Prior images reviewed side by side. FINDINGS  Left Ventricle: Left ventricular ejection fraction, by estimation, is 65 to 70%.  The left ventricle has normal function. The left ventricle has no regional wall motion abnormalities. The left ventricular internal cavity size was normal in size. There is  no left ventricular hypertrophy. Left ventricular diastolic parameters are consistent with Grade I diastolic dysfunction (impaired relaxation). Right Ventricle: The right ventricular size is normal. No increase in right ventricular wall thickness. Right ventricular systolic function is normal. Left Atrium: Left atrial size was normal in size. Right Atrium: Right atrial size was normal in size. Pericardium: There is no evidence of pericardial effusion. Mitral Valve: The mitral valve is normal in structure. No evidence of mitral valve regurgitation. No evidence of mitral valve stenosis. Tricuspid Valve: The tricuspid valve is normal in structure. Tricuspid valve regurgitation is not demonstrated. No evidence of tricuspid stenosis. Aortic Valve: The aortic valve is tricuspid. There is mild calcification of the aortic valve. Aortic valve regurgitation is not visualized. Aortic valve sclerosis is present, with no evidence of aortic valve stenosis. Aortic valve peak gradient measures 4.0 mmHg. Pulmonic Valve: The pulmonic valve was normal in structure. Pulmonic valve regurgitation is trivial. No evidence of pulmonic stenosis. Aorta: The aortic root is normal in size and structure. Venous: The inferior vena cava is normal in size with greater than 50% respiratory variability, suggesting right atrial pressure of 3 mmHg. IAS/Shunts: No atrial level shunt detected by color flow Doppler. Agitated saline contrast was given intravenously to evaluate for intracardiac shunting. Agitated saline contrast bubble study was negative, with no evidence of any interatrial shunt. There  is no evidence of Slayton Lubitz patent foramen ovale.  LEFT VENTRICLE PLAX 2D LVIDd:         3.60 cm   Diastology LVIDs:         1.80 cm   LV e' medial:    6.16 cm/s LV PW:         1.10 cm   LV  E/e' medial:  6.8 LV IVS:        1.00 cm  LV e' lateral:   8.92 cm/s LVOT diam:     1.90 cm   LV E/e' lateral: 4.7 LV SV:         46 LV SV Index:   31 LVOT Area:     2.84 cm  RIGHT VENTRICLE             IVC RV Basal diam:  2.40 cm     IVC diam: 0.80 cm RV S prime:     13.90 cm/s TAPSE (M-mode): 1.9 cm LEFT ATRIUM             Index        RIGHT ATRIUM          Index LA diam:        2.60 cm 1.74 cm/m   RA Area:     8.82 cm LA Vol (A2C):   30.0 ml 20.05 ml/m  RA Volume:   15.50 ml 10.36 ml/m LA Vol (A4C):   23.5 ml 15.70 ml/m LA Biplane Vol: 27.0 ml 18.04 ml/m  AORTIC VALVE                 PULMONIC VALVE AV Area (Vmax): 3.04 cm     PV Vmax:       0.97 m/s AV Vmax:        99.70 cm/s   PV Peak grad:  3.8 mmHg AV Peak Grad:   4.0 mmHg LVOT Vmax:      107.00 cm/s LVOT Vmean:     65.500 cm/s LVOT VTI:       0.164 m  AORTA Ao Root diam: 2.90 cm Ao Asc diam:  2.90 cm MITRAL VALVE MV Area (PHT): 3.31 cm    SHUNTS MV Decel Time: 229 msec    Systemic VTI:  0.16 m MV E velocity: 41.60 cm/s  Systemic Diam: 1.90 cm MV Sairah Knobloch velocity: 90.40 cm/s MV E/Banesa Tristan ratio:  0.46 Candee Furbish MD Electronically signed by Candee Furbish MD Signature Date/Time: 07/22/2021/1:16:58 PM    Final    VAS [place] LOWER EXTREMITY VENOUS (DVT)  Result Date: 07/22/2021  Lower Venous DVT Study Patient Name:  GINAMARIE ALLBEE [facility]  Date of Exam:   07/22/2021 Medical Rec #: [id number]             Accession #:    [id number] Date of Birth: 04/22/1951             Patient Gender: F Patient Age:   36 years Exam Location[facility]  [facility] Procedure:      VAS [place] LOWER EXTREMITY VENOUS (DVT) Referring Phys: MURALI RAMASWAMY --------------------------------------------------------------------------------  Indications: Hypoxia.  Risk Factors: HX of RLE DVT (2013). Comparison Study: Previous exam on 5.9.2014 was negative for DVT Performing Technologist: Rogelia Rohrer RVT, RDMS  Examination Guidelines: Keith Felten complete evaluation includes B-mode imaging, spectral Doppler, color  Doppler, and power Doppler as needed of all accessible portions of each vessel. Bilateral testing is considered an integral part of Jahlen Bollman complete examination. Limited examinations for reoccurring indications may be performed as noted. The reflux portion of the exam is performed with the patient in reverse Trendelenburg.  +---------+---------------+---------+-----------+----------+--------------+  RIGHT     Compressibility Phasicity Spontaneity Properties Thrombus Aging  +---------+---------------+---------+-----------+----------+--------------+  CFV       Full            Yes       Yes                                    +---------+---------------+---------+-----------+----------+--------------+  SFJ       Full                                                             +---------+---------------+---------+-----------+----------+--------------+  FV Prox   Full            Yes       Yes                                    +---------+---------------+---------+-----------+----------+--------------+  FV Mid    Full            Yes       Yes                                    +---------+---------------+---------+-----------+----------+--------------+  FV Distal Full            Yes       Yes                                    +---------+---------------+---------+-----------+----------+--------------+  PFV       Full                                                             +---------+---------------+---------+-----------+----------+--------------+  POP       Full            Yes       Yes                                    +---------+---------------+---------+-----------+----------+--------------+  PTV       Full                                                             +---------+---------------+---------+-----------+----------+--------------+  PERO      Full                                                             +---------+---------------+---------+-----------+----------+--------------+    +---------+---------------+---------+-----------+----------+--------------+  LEFT      Compressibility Phasicity Spontaneity Properties Thrombus Aging  +---------+---------------+---------+-----------+----------+--------------+  CFV       Full            Yes       Yes                                    +---------+---------------+---------+-----------+----------+--------------+  SFJ       Full                                                             +---------+---------------+---------+-----------+----------+--------------+  FV Prox   Full            Yes       Yes                                    +---------+---------------+---------+-----------+----------+--------------+  FV Mid    Full            Yes       Yes                                    +---------+---------------+---------+-----------+----------+--------------+  FV Distal Full            Yes       Yes                                    +---------+---------------+---------+-----------+----------+--------------+  PFV       Full                                                             +---------+---------------+---------+-----------+----------+--------------+  POP       Full            Yes       Yes                                    +---------+---------------+---------+-----------+----------+--------------+  PTV       Full                                                             +---------+---------------+---------+-----------+----------+--------------+  PERO      Full                                                             +---------+---------------+---------+-----------+----------+--------------+ Venous dilation in area of popliteal/ssv junction.    Summary: BILATERAL: - No evidence of deep vein thrombosis seen in the lower extremities, bilaterally. - No evidence of superficial venous thrombosis in the lower extremities, bilaterally. -No evidence of popliteal cyst, bilaterally.   *See table(s) above for measurements and observations.     Preliminary         Scheduled Meds:  budesonide (PULMICORT) nebulizer solution  0.25 mg Nebulization  BID   busPIRone  15 mg Oral BID   enoxaparin (LOVENOX) injection  40 mg Subcutaneous Q24H   escitalopram  10 mg Oral Daily   furosemide  20 mg Oral Daily   guaiFENesin  1,200 mg Oral BID   ipratropium-albuterol  3 mL Nebulization Q6H   methylPREDNISolone (SOLU-MEDROL) injection  40 mg Intravenous Q12H   simvastatin  10 mg Oral Daily   sodium chloride flush  3 mL Intravenous Q12H   Continuous Infusions:   LOS: 7 days    Time spent: over 30 min    Fayrene Helper, MD [facility]   To contact the attending provider between 7A-7P or the covering provider during after hours 7P-7A, please log into the web site [email or web address] and access using universal [place] password for that web site. If you do not have the password, please call the hospital operator.  07/22/2021, 5:08 PM

## 2021-07-22 NOTE — Assessment & Plan Note (Signed)
Buspar, lexapro, atarax

## 2021-07-22 NOTE — Assessment & Plan Note (Signed)
Noted on CT scan on L Follow with PCP outpatient

## 2021-07-22 NOTE — Assessment & Plan Note (Addendum)
Due to COPD exacerbation Hx COPD, emphysema, was not on nebs at home and no pulmonologist Smoking up until admission Currently requiring 2 L at rest (required 4 L with activity based on recent PT eval from 2/23) Continue to wean o2 as tolerated for sat goal 88-94% (required as much as 8 L during this hospitalization) CT 2/20 with centrilobular emphysema with mild diffuse bronchial wall thickening and scattered mucous plugging c/w smoking related lung disease Will discharge with steroid taper Will continue diuresis as tolerated, doesn't appear overtly overloaded pulm consult, appreciate assistance pending alpha 1 anti trypsin study Needs to follow outpatient with pulm Lactate pending, nocturnal NIPPV Echo with EF Q000111Q, grade 1 diastolic dysfunction, negative bubble study Negative LE [place]

## 2021-07-22 NOTE — Assessment & Plan Note (Signed)
simvastatin 

## 2021-07-22 NOTE — Hospital Course (Addendum)
Tasha [name] is Tasha [name] 71 y.o. female with PMH significant for COPD, chronic smoking, HTN, HLD, anxiety, depression, arthritis, GERD, DVT, scoliosis who lives at home alone.  She was brought in for worsening shortness of breath and cough.  She was found to have Korde Jeppsen COPD exacerbation and has slowly improved with steroids and nebs.  Pulm was consulted and plans for outpatient follow up.  She's gradually improved, now requiring 2 L at rest and 4 L with activity.  She'll discharge with continued plans for outpatient follow up.   See below for additional details

## 2021-07-22 NOTE — Progress Notes (Signed)
Transition of Care (TOC) -30 day Note       Patient Details  Name: Tasha [name] MRN: [id number] Date of Birth: February 11, 1951   MUST ID:    To Whom it May Concern:[id number]    Please be advised that the above patient will require a short-term nursing home stay, anticipated 30 days or less rehabilitation and strengthening. The plan is for return home.

## 2021-07-22 NOTE — Progress Notes (Signed)
Occupational Therapy Treatment Patient Details Name: Tasha [name] MRN: [id number] DOB: 04-23-1951 Today's Date: 07/22/2021   History of present illness Patient is 71 year old female who presented with increased shortness of breath. patient was admitted with COPD exacerbation and acute respiratory failure with hypoxia on 07/14/21. PMH: COPD, asthma, depression, tobacco abuse, HTN, HLD, anxiety.   OT comments  Treatment focused on education in regards to COPD disease process, need for oxygen, breathing techniques, and safety all while performing ADLs and in room ambulation. Patient found on 6 L HFNC at 99%. O2 sats maintained above 92% on 6 L with ambulation and ADLs and without significant signs of dyspnea. Patient appears to have no understanding of disease process and need for oxygen - thus education initiated. She needed overall supervision for all tasks for safety - to make sure she kept her oxygen on, managed the cannula line and dyspnea monitored. She has no assistance of family and cognitive assessments suggest impairment. Therapist recommends short term rehab at discharge to maximize patient's physical abilities but also her ability to manage her condition safely.    Recommendations for follow up therapy are one component of a multi-disciplinary discharge planning process, led by the attending physician.  Recommendations may be updated based on patient status, additional functional criteria and insurance authorization.    Follow Up Recommendations  Skilled nursing-short term rehab (<3 hours/day)    Assistance Recommended at Discharge Frequent or constant Supervision/Assistance  Patient can return home with the following  A little help with walking and/or transfers;A little help with bathing/dressing/bathroom;Assistance with cooking/housework;Direct supervision/assist for financial management;Direct supervision/assist for medications management   Equipment Recommendations  None  recommended by OT    Recommendations for Other Services      Precautions / Restrictions Precautions Precaution Comments: monitor O2, anxiety Restrictions Weight Bearing Restrictions: No       Mobility Bed Mobility                    Transfers                         Balance Overall balance assessment: Mild deficits observed, not formally tested                                         ADL either performed or assessed with clinical judgement   ADL Overall ADL's : Needs assistance/impaired     Grooming: Oral care;Wash/dry face;Wash/dry hands;Supervision/safety;Standing Grooming Details (indicate cue type and reason): stood at sink to perform grooming                 Toilet Transfer: Supervision/safety;Regular Toilet;Grab bars Toilet Transfer Details (indicate cue type and reason): supervision to perform toilet transfer Toileting- Clothing Manipulation and Hygiene: Supervision/safety;Sit to/from stand Toileting - Clothing Manipulation Details (indicate cue type and reason): able to manage clothing independently     Functional mobility during ADLs: Supervision/safety General ADL Comments: Overall supervision for in room ambulation without device and bed mobility. Patient able to perform toileting and standing at sink on 6 L HFNC with o2 sats maintaining above 92%. Exhibits some increased RR but no complaints of shortness of breath. Patient needed extensive education in regards to COPD, breathing techniques, energy conservation, need for oxygen. Patient's friend present for education as well.    Extremity/Trunk Assessment Upper Extremity Assessment Upper Extremity Assessment: Overall  WFL for tasks assessed            Vision   Vision Assessment?: No apparent visual deficits   Perception     Praxis      Cognition Arousal/Alertness: Awake/alert Behavior During Therapy: WFL for tasks assessed/performed Overall Cognitive  Status: Within Functional Limits for tasks assessed                                 General Comments: Unsure of baseline, does require increased cues for O2 use, and pursed lip breathing. OT cognitive screen showing deficits. Is alert and oriented but is unable to state what disease she has - seems to have no understanding of disease process.        Exercises      Shoulder Instructions       General Comments      Pertinent Vitals/ Pain       Pain Assessment Pain Assessment: No/denies pain  Home Living                                          Prior Functioning/Environment              Frequency  Min 2X/week        Progress Toward Goals  OT Goals(current goals can now be found in the care plan section)  Progress towards OT goals: Progressing toward goals  Acute Rehab OT Goals Patient Stated Goal: to return to independence OT Goal Formulation: With patient Time For Goal Achievement: 08/03/21 Potential to Achieve Goals: Good  Plan Discharge plan needs to be updated    Co-evaluation                 AM-PAC OT "6 Clicks" Daily Activity     Outcome Measure   Help from another person eating meals?: None Help from another person taking care of personal grooming?: A Little Help from another person toileting, which includes using toliet, bedpan, or urinal?: A Little Help from another person bathing (including washing, rinsing, drying)?: A Little Help from another person to put on and taking off regular upper body clothing?: A Little Help from another person to put on and taking off regular lower body clothing?: A Little 6 Click Score: 19    End of Session Equipment Utilized During Treatment: Oxygen  OT Visit Diagnosis: Other symptoms and signs involving cognitive function;Unsteadiness on feet (R26.81)   Activity Tolerance Patient tolerated treatment well   Patient Left in bed;with call bell/phone within reach;with  family/visitor present   Nurse Communication  (o2 on 4 L)        Time: 0272-5366 OT Time Calculation (min): 34 min  Charges: OT General Charges $OT Visit: 1 Visit OT Treatments $Self Care/Home Management : 8-22 mins $Therapeutic Activity: 8-22 mins  Edward Trevino, OTR/L Acute Care Rehab Services  Office [phone] Pager: [phone]   Kelli Churn 07/22/2021, 9:38 AM

## 2021-07-22 NOTE — Progress Notes (Signed)
Echocardiogram 2D Echocardiogram has been performed.  Tasha [name] 07/22/2021, 11:56 AM

## 2021-07-22 NOTE — TOC Initial Note (Signed)
Transition of Care [facility]) - Initial/Assessment Note    Patient Details  Name: Tasha [name] MRN: [id number] Date of Birth: 11-12-1950  Transition of Care [facility]) CM/SW Contact:    Bartholome Bill, RN Phone Number: 07/22/2021, 3:22 PM  Clinical Narrative:                 Spoke with pt at bedside who deferred dc planning to friend Darl Pikes. Spoke with Darl Pikes via phone who agrees to faxing out pt FL2 for SNF options. Pasrr pending at this time.  TOC will follow up with SNF bed offers  Expected Discharge Plan: Skilled Nursing Facility Barriers to Discharge: Continued Medical Work up   Patient Goals and CMS Choice Patient states their goals for this hospitalization and ongoing recovery are:: To get home CMS Medicare.gov Compare Post Acute Care list provided to:: Patient Choice offered to / list presented to : NA  Expected Discharge Plan and Services Expected Discharge Plan: Skilled Nursing Facility   Discharge Planning Services: CM Consult   Living arrangements for the past 2 months: Single Family Home Expected Discharge Date: 07/19/21               DME Arranged: Oxygen DME Agency: [employer] Date DME Agency Contacted: 07/19/21 Time DME Agency Contacted: 1053 Representative spoke with at DME Agency: Mittie Bodo - # [phone] HH Arranged: NA          Prior Living Arrangements/Services Living arrangements for the past 2 months: Single Family Home Lives with:: Self Patient language and need for interpreter reviewed:: Yes        Need for Family Participation in Patient Care: Yes (Comment) Care giver support system in place?: Yes (comment)   Criminal Activity/Legal Involvement Pertinent to Current Situation/Hospitalization: No - Comment as needed  Activities of Daily Living Home Assistive Devices/Equipment: None ADL Screening (condition at time of admission) Patient's cognitive ability adequate to safely complete daily activities?: Yes Is the patient  deaf or have difficulty hearing?: No Does the patient have difficulty seeing, even when wearing glasses/contacts?: No Does the patient have difficulty concentrating, remembering, or making decisions?: No Patient able to express need for assistance with ADLs?: Yes Does the patient have difficulty dressing or bathing?: No Independently performs ADLs?: Yes (appropriate for developmental age) Does the patient have difficulty walking or climbing stairs?: No Weakness of Legs: None Weakness of Arms/Hands: None  Permission Sought/Granted Permission sought to share information with : Facility Industrial/product designer granted to share information with : Yes, Verbal Permission Granted     Permission granted to share info w AGENCY: Fax out in hub        Emotional Assessment Appearance:: Appears stated age Attitude/Demeanor/Rapport: Engaged Affect (typically observed): Calm Orientation: : Oriented to Self, Oriented to Place, Oriented to  Time, Oriented to Situation Alcohol / Substance Use: Not Applicable Psych Involvement: No (comment)  Admission diagnosis:  COPD exacerbation (HCC) [J44.1] Patient Active Problem List   Diagnosis Date Noted   COPD exacerbation (HCC) 07/14/2021   COPD with acute exacerbation (HCC) 12/10/2017   Acute on chronic respiratory failure with hypoxia (HCC) 12/10/2017   Generalized anxiety disorder 12/09/2017   COPD, group C, by GOLD 2017 classification (HCC) 08/24/2016   Nut allergy 09/03/2015   Abnormal nuclear stress test 06/10/2015   Family history of colorectal cancer 05/27/2015   Tobacco abuse 05/27/2015   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 [place] [address] Phone: [phone] Fax: [phone]  [id number] - [place], [place] - [address] [place] [address] Phone: [phone] Fax: [phone]  [id number] - [place], [place] - [address] AT [facility] [address] [address] Phone: [phone] Fax: [phone]     Social Determinants of Health (SDOH) Interventions    Readmission Risk Interventions No flowsheet data found.

## 2021-07-22 NOTE — Progress Notes (Signed)
BLE venous duplex has been completed.  Results can be found under chart review under CV PROC. 07/22/2021 10:21 AM Kyleigha Markert RVT, RDMS

## 2021-07-23 DIAGNOSIS — J441 Chronic obstructive pulmonary disease with (acute) exacerbation: Secondary | ICD-10-CM | POA: Diagnosis not present

## 2021-07-23 LAB — CBC WITH DIFFERENTIAL/PLATELET
Abs Immature Granulocytes: 0.04 10*3/uL (ref 0.00–0.07)
Basophils Absolute: 0 10*3/uL (ref 0.0–0.1)
Basophils Relative: 0 %
Eosinophils Absolute: 0 10*3/uL (ref 0.0–0.5)
Eosinophils Relative: 0 %
HCT: 45.1 % (ref 36.0–46.0)
Hemoglobin: 14.6 g/dL (ref 12.0–15.0)
Immature Granulocytes: 0 %
Lymphocytes Relative: 3 %
Lymphs Abs: 0.4 10*3/uL — ABNORMAL LOW (ref 0.7–4.0)
MCH: 31.1 pg (ref 26.0–34.0)
MCHC: 32.4 g/dL (ref 30.0–36.0)
MCV: 96 fL (ref 80.0–100.0)
Monocytes Absolute: 0.7 10*3/uL (ref 0.1–1.0)
Monocytes Relative: 4 %
Neutro Abs: 13.8 10*3/uL — ABNORMAL HIGH (ref 1.7–7.7)
Neutrophils Relative %: 93 %
Platelets: 280 10*3/uL (ref 150–400)
RBC: 4.7 MIL/uL (ref 3.87–5.11)
RDW: 13.7 % (ref 11.5–15.5)
WBC: 14.9 10*3/uL — ABNORMAL HIGH (ref 4.0–10.5)
nRBC: 0 % (ref 0.0–0.2)

## 2021-07-23 LAB — COMPREHENSIVE METABOLIC PANEL
ALT: 29 U/L (ref 0–44)
AST: 16 U/L (ref 15–41)
Albumin: 3.2 g/dL — ABNORMAL LOW (ref 3.5–5.0)
Alkaline Phosphatase: 85 U/L (ref 38–126)
Anion gap: 6 (ref 5–15)
BUN: 40 mg/dL — ABNORMAL HIGH (ref 8–23)
CO2: 34 mmol/L — ABNORMAL HIGH (ref 22–32)
Calcium: 9.4 mg/dL (ref 8.9–10.3)
Chloride: 95 mmol/L — ABNORMAL LOW (ref 98–111)
Creatinine, Ser: 0.85 mg/dL (ref 0.44–1.00)
GFR, Estimated: >60 mL/min (ref >60)
Glucose, Bld: 166 mg/dL — ABNORMAL HIGH (ref 70–99)
Potassium: 4.7 mmol/L (ref 3.5–5.1)
Sodium: 135 mmol/L (ref 135–145)
Total Bilirubin: 0.3 mg/dL (ref 0.3–1.2)
Total Protein: 6.2 g/dL — ABNORMAL LOW (ref 6.5–8.1)

## 2021-07-23 LAB — LEGIONELLA PNEUMOPHILA SEROGP 1 UR AG: L. pneumophila Serogp 1 Ur Ag: NEGATIVE

## 2021-07-23 LAB — PHOSPHORUS: Phosphorus: 3.2 mg/dL (ref 2.5–4.6)

## 2021-07-23 LAB — LACTIC ACID, PLASMA: Lactic Acid, Venous: 1.7 mmol/L (ref 0.5–1.9)

## 2021-07-23 LAB — MAGNESIUM: Magnesium: 2.4 mg/dL (ref 1.7–2.4)

## 2021-07-23 MED ORDER — IPRATROPIUM-ALBUTEROL 0.5-2.5 (3) MG/3ML IN SOLN: 3.0000 mL | RESPIRATORY_TRACT | Status: DC | PRN

## 2021-07-23 MED ORDER — IPRATROPIUM-ALBUTEROL 0.5-2.5 (3) MG/3ML IN SOLN
3.0000 mL | Freq: Four times a day (QID) | RESPIRATORY_TRACT | Status: DC
  Administered 2021-07-24 – 2021-07-27 (×10): 3 mL via RESPIRATORY_TRACT
  Filled 2021-07-23 (×10): qty 3

## 2021-07-23 MED ORDER — PREDNISONE 20 MG PO TABS
40.0000 mg | ORAL_TABLET | Freq: Every day | ORAL | Status: DC
  Administered 2021-07-24 – 2021-07-25 (×2): 40 mg via ORAL
  Filled 2021-07-23 (×2): qty 2

## 2021-07-23 NOTE — Progress Notes (Signed)
On my way to visit the patient for scheduled BD. RN informed me that the patient could not go to sleep with the BiPAP on due to being anxious, diaphoretic and uncomfortable. RN states she took the mask off and applied her O2 and the patient went off to sleep. RT was not informed of patient discomfort at the time of the event. Therefore, requested that RN enter a progress note on the events that prevented BiPAP use. RN agrees. At this time, the RN requests that patient not be disrupted from sleep for BD because once she is awakened, she does not go back to sleep.

## 2021-07-23 NOTE — Progress Notes (Signed)
Patient was not able to tolerate BiPap, she was very anxious, uncomfortable and sweating a lot.Patient requested to take it off. RN took Bipap off and applied oxygen. Patient verbalized feeling much better with out Bipap and then she went to fall sleep. RRT notified during her round.

## 2021-07-23 NOTE — Progress Notes (Signed)
Visited with patient for scheduled BD and to educate on BiPAP. After BD given, BiPAP mask applied and fitted for patient use and to allow her to get a feel of what the mask will feel like when placed for sleep. Patient did not initially tolerate well and began asking for it to be taken off before the fitting was complete. Reassurance given and the patient was calmed easily by being allowed to touch and feel the mask while the straps were being adjusted. Once the fit was appropriate, the mask was removed and patient was informed that RT would return again later to reapply for sleep. Patient agrees to try her best.

## 2021-07-23 NOTE — Progress Notes (Signed)
NAME:  Tasha [name], MRN:  [id number], DOB:  1951/03/07, LOS: 8 ADMISSION DATE:  07/14/2021, CONSULTATION DATE:  07/21/21 REFERRING MD:  Dr Pietro Cassis, CHIEF COMPLAINT:  Acute on chronic resp failure   Brief History:  Admitted 07/14/2021 with worsening shortness of breath insidious onset over a few to several days.  At arrival was hypoxemic at 88% requiring 2 L nasal cannula.  Noted to use accessory muscles and wheezing at admission diagnosis COPD exacerbation and started on steroids and bronchodilators.  Past Medical History:  HLD HTN GERD Prior DVT Depression Allergies Arthritis   Significant Hospital Events:  07/14/2021 - admit 07/16/2021: 3 L oxygen at rest and 5 L with ambulation.  Unable to complete sentence.  Still on IV steroids echo with ejection fraction 65 to 70% and grade 1 diastolic dysfunction but?  PFO 07/18/2021: Still requiring 3 L nasal cannula and still with wheezing.  IV steroids changed to p.o. prednisone 07/19/2021: Pulse ox room air at rest 92% desaturated to 87% with ambulation requiring 4 L to correct with ambulation.  Discharge home held because of social status 07/20/2021: Now requiring 8 L oxygen with increased shortness of breath.  CT chest with contrast with no comment on PE but nothing new. Afebrile throughout since admission. 07/21/2021: Due to high oxygen needs IV steroids restarted from p.o. prednisone.  Hydrochlorothiazide changed to IV Lasix [has been getting Lovenox throughout admission].  CCM consulted 2/22 Oxygen wean back down to 4L [place],  Interim History / Subjective:  Seen lying in bed with no acute complaints  Friend update at bedside   Objective   Blood pressure 122/78, pulse 85, temperature 97.6 F (36.4 C), temperature source Oral, resp. rate 17, height 5\' 2"  (1.575 m), weight 51 kg, SpO2 92 %. Estimated body mass index is 20.56 kg/m as calculated from the following:   Height as of this encounter: 5\' 2"  (1.575 m).   Weight as of this  encounter: 51 kg.         Intake/Output Summary (Last 24 hours) at 07/23/2021 1346 Last data filed at 07/23/2021 1222 Gross per 24 hour  Intake 595 ml  Output 1300 ml  Net -705 ml    Filed Weights   07/14/21 1436 07/14/21 2302  Weight: 50.3 kg 51 kg    Examination: General: Acute on chronically ill appearing elderly female lying in bed, in NAD HEENT: [place]/AT, MM pink/moist, PERRL,  Neuro: Alert and oriented x3, some concern for underlying confusion/dementia  CV: s1s2 regular rate and rhythm, no murmur, rubs, or gallops,  PULM:  Clear to ascultation, quick shallow respiration, no increased work of breathing  GI: soft, bowel sounds active in all 4 quadrants, non-tender, non-distended, tolerating oral diet  Extremities: warm/dry, no edema  Skin: no rashes or lesions  Resolved Hospital Problem list   x  Assessment & Plan:   COPD exacerbation with acute on chronic hypoxemic respiratory failure  -Initially saw partial improvement but subsequent decline requiring significantly more oxygen 8 L nasal cannula -RVP panel negative  -Low odds for PE [no comment on CT chest with contrast yesterday and has been on Lovenox DVT prophylaxis all along -Differential diagnosis includes PFO [echo suggested that] and also diastolic dysfunction or worsening airway disease after switching to p.o. prednisone History of smoking History of COPD not otherwise specified History of failure to thrive and weight loss and cachexia -1 year prior to admission P:   ECHO bubble study negative as well as lower extremity doppler  Slower  steroids taper can taper to PO starting tomorrow  Continue BDs  Aggressive pulmonary hygiene  Continue to wean supplemental oxygen as able  Will need outpatient follow up apt    PCCM will sign off. Thank you for the opportunity to participate in this patient's care. Please contact if we can be of further assistance.   Best practice (daily eval):  Per triad  SIGNATURE   Hartlyn Reigel D. Kenton Kingfisher, NP-C [facility] Personal contact information can be found on Amion  07/23/2021, 1:46 PM

## 2021-07-23 NOTE — Progress Notes (Signed)
Patient declines the use of nocturnal BiPAP tonight, stating "I don't like that thing". Education provided. RT will continue to follow and encourage use.

## 2021-07-23 NOTE — Progress Notes (Signed)
PROGRESS NOTE    Tasha [name]  [id number] DOB: 1950/08/22 DOA: 07/14/2021 PCP: Darreld Mclean, MD  No chief complaint on file.   Brief Narrative:  Tasha [name] is Tasha [name] 71 y.o. female with PMH significant for COPD, chronic smoking, HTN, HLD, anxiety, depression, arthritis, GERD, DVT, scoliosis who lives at home alone. Patient was brought to the ED on 07/14/2021 for several weeks of progressively worsening shortness of breath, cough.   In the ED, patient was afebrile, hypoxic at 88% requiring 2 L oxygen nasal cannula.  Heart rate in 90s, respiratory rate in 20s, WBC count 15.3 Rest virus panel negative for flu and COVID PCR Chest x-ray did not show any abnormality VBG with normal pH and PCO2 Patient was given IV Solu-Medrol, DuoNeb, magnesium despite which she continued to be in respiratory distress and hence kept in observation to hospitalist service. See below for details    Assessment & Plan:   Principal Problem:   COPD exacerbation ([place]) Active Problems:   Acute on chronic respiratory failure with hypoxia and hypercapnia (HCC)   Anxiety and depression   Hypercholesteremia   Adrenal nodule (HCC)   Assessment and Plan: Acute on chronic respiratory failure with hypoxia and hypercapnia (HCC) Hx COPD, emphysema, was not on nebs at home and no pulmonologist Smoking up until admission Currently requiring 5 L [place] (required as much as 8 L during this hospitalization) CT 2/20 with centrilobular emphysema with mild diffuse bronchial wall thickening and scattered mucous plugging c/w smoking related lung disease Currently on PO prednisone, scheduled and prn nebs Will continue diuresis as tolerated, doesn't appear overtly overloaded pulm consult, appreciate assistance Lactate pending, nocturnal NIPPV Echo with EF [id number], grade 1 diastolic dysfunction, negative bubble study Negative LE [place]        Anxiety and depression- (present on admission) Buspar, lexapro,  atarax  Hypercholesteremia- (present on admission) simvastatin  Adrenal nodule ([place]) Noted on CT scan on L Follow with PCP outpatient   DVT prophylaxis: lovenox Code Status: full Family Communication: none Disposition:   Status is: Inpatient Remains inpatient appropriate because: need for weaning of o2, treating and w/u of acute hypoxic resp failure   Consultants:  Pulm  Procedures:  Echo IMPRESSIONS     1. Left ventricular ejection fraction, by estimation, is 65 to 70%. The  left ventricle has normal function. The left ventricle has no regional  wall motion abnormalities. Left ventricular diastolic parameters are  consistent with Grade I diastolic  dysfunction (impaired relaxation).   2. Right ventricular systolic function is normal. The right ventricular  size is normal.   3. The mitral valve is normal in structure. No evidence of mitral valve  regurgitation. No evidence of mitral stenosis.   4. The aortic valve is tricuspid. There is mild calcification of the  aortic valve. Aortic valve regurgitation is not visualized. Aortic valve  sclerosis is present, with no evidence of aortic valve stenosis.   5. The inferior vena cava is normal in size with greater than 50%  respiratory variability, suggesting right atrial pressure of 3 mmHg.   6. Agitated saline contrast bubble study was negative, with no evidence  of any interatrial shunt.   Comparison(s): No significant change from prior study. Prior images  reviewed side by side.   LE [place]  Summary:  BILATERAL:  - No evidence of deep vein thrombosis seen in the lower extremities,  bilaterally.  - No evidence of superficial venous thrombosis in the lower extremities,  bilaterally.  -  No evidence of popliteal cyst, bilaterally.   Antimicrobials:  Anti-infectives (From admission, onward)    None       Subjective: No new complaints  Objective: Vitals:   07/23/21 0511 07/23/21 0745 07/23/21 0756 07/23/21 1337   BP: (!) 144/92 104/84  122/78  Pulse: (!) 102 77  85  Resp: 16 16  17   Temp: 98.5 F (36.9 C) 97.6 F (36.4 C)  97.6 F (36.4 C)  TempSrc: Oral Oral  Oral  SpO2: 92% 95% 94% 92%  Weight:      Height:        Intake/Output Summary (Last 24 hours) at 07/23/2021 1959 Last data filed at 07/23/2021 1222 Gross per 24 hour  Intake 715 ml  Output 1300 ml  Net -585 ml   Filed Weights   07/14/21 1436 07/14/21 2302  Weight: 50.3 kg 51 kg    Examination:  General: No acute distress. Cardiovascular: RRR Lungs: unlabored Abdomen: Soft, nontender, nondistended  Neurological: Alert and oriented 3. Moves all extremities 4 . Cranial nerves II through XII grossly intact. Skin: Warm and dry. No rashes or lesions. Extremities: No clubbing or cyanosis. No edema.   Data Reviewed: I have personally reviewed following labs and imaging studies  CBC: Recent Labs  Lab 07/18/21 0742 07/22/21 1313 07/23/21 0600  WBC 9.1 14.4* 14.9*  NEUTROABS  --   --  13.8*  HGB 15.1* 16.1* 14.6  HCT 46.6* 48.7* 45.1  MCV 95.3 94.2 96.0  PLT 289 332 [id number]    Basic Metabolic Panel: Recent Labs  Lab 07/21/21 0538 07/22/21 1313 07/23/21 0600  NA  --  136 135  K  --  4.2 4.7  CL  --  95* 95*  CO2  --  34* 34*  GLUCOSE  --  158* 166*  BUN  --  37* 40*  CREATININE 0.76 0.82 0.85  CALCIUM  --  9.9 9.4  MG  --   --  2.4  PHOS  --   --  3.2    GFR: Estimated Creatinine Clearance: 47.3 mL/min (by C-G formula based on SCr of 0.85 mg/dL).  Liver Function Tests: Recent Labs  Lab 07/23/21 0600  AST 16  ALT 29  ALKPHOS 85  BILITOT 0.3  PROT 6.2*  ALBUMIN 3.2*    CBG: No results for input(s): GLUCAP in the last 168 hours.   Recent Results (from the past 240 hour(s))  Resp Panel by RT-PCR (Flu Tasha [name]&B, Covid) Nasopharyngeal Swab     Status: None   Collection Time: 07/14/21  2:33 PM   Specimen: Nasopharyngeal Swab; Nasopharyngeal(NP) swabs in vial transport medium  Result Value Ref Range  Status   SARS Coronavirus 2 by RT PCR NEGATIVE NEGATIVE Final    Comment: (NOTE) SARS-CoV-2 target nucleic acids are NOT DETECTED.  The SARS-CoV-2 RNA is generally detectable in upper respiratory specimens during the acute phase of infection. The lowest concentration of SARS-CoV-2 viral copies this assay can detect is 138 copies/mL. Tasha [name] negative result does not preclude SARS-Cov-2 infection and should not be used as the sole basis for treatment or other patient management decisions. Burgundy Matuszak negative result may occur with  improper specimen collection/handling, submission of specimen other than nasopharyngeal swab, presence of viral mutation(s) within the areas targeted by this assay, and inadequate number of viral copies(<138 copies/mL). Cherika Jessie negative result must be combined with clinical observations, patient history, and epidemiological information. The expected result is Negative.  Fact Sheet for Patients:  EntrepreneurPulse.com.au  Fact Sheet  for Healthcare Providers:  SeriousBroker.it  This test is no t yet approved or cleared by the [place] and  has been authorized for detection and/or diagnosis of SARS-CoV-2 by FDA under an Emergency Use Authorization (EUA). This EUA will remain  in effect (meaning this test can be used) for the duration of the COVID-19 declaration under Section 564(b)(1) of the Act, 21 U.S.C.section 360bbb-3(b)(1), unless the authorization is terminated  or revoked sooner.       Influenza Tasha [name] by PCR NEGATIVE NEGATIVE Final   Influenza B by PCR NEGATIVE NEGATIVE Final    Comment: (NOTE) The Xpert Xpress SARS-CoV-2/FLU/RSV plus assay is intended as an aid in the diagnosis of influenza from Nasopharyngeal swab specimens and should not be used as Jonty Morrical sole basis for treatment. Nasal washings and aspirates are unacceptable for Xpert Xpress SARS-CoV-2/FLU/RSV testing.  Fact Sheet for  Patients: BloggerCourse.com  Fact Sheet for Healthcare Providers: SeriousBroker.it  This test is not yet approved or cleared by the [place] FDA and has been authorized for detection and/or diagnosis of SARS-CoV-2 by FDA under an Emergency Use Authorization (EUA). This EUA will remain in effect (meaning this test can be used) for the duration of the COVID-19 declaration under Section 564(b)(1) of the Act, 21 U.S.C. section 360bbb-3(b)(1), unless the authorization is terminated or revoked.  Performed at [facility], [address], [place], [place] [address]   Respiratory (~20 pathogens) panel by PCR     Status: None   Collection Time: 07/14/21  6:47 PM   Specimen: Nasopharyngeal Swab; Respiratory  Result Value Ref Range Status   Adenovirus NOT DETECTED NOT DETECTED Final   Coronavirus 229E NOT DETECTED NOT DETECTED Final    Comment: (NOTE) The Coronavirus on the Respiratory Panel, DOES NOT test for the novel  Coronavirus (2019 nCoV)    Coronavirus HKU1 NOT DETECTED NOT DETECTED Final   Coronavirus NL63 NOT DETECTED NOT DETECTED Final   Coronavirus OC43 NOT DETECTED NOT DETECTED Final   Metapneumovirus NOT DETECTED NOT DETECTED Final   Rhinovirus / Enterovirus NOT DETECTED NOT DETECTED Final   Influenza Tasha [name] NOT DETECTED NOT DETECTED Final   Influenza B NOT DETECTED NOT DETECTED Final   Parainfluenza Virus 1 NOT DETECTED NOT DETECTED Final   Parainfluenza Virus 2 NOT DETECTED NOT DETECTED Final   Parainfluenza Virus 3 NOT DETECTED NOT DETECTED Final   Parainfluenza Virus 4 NOT DETECTED NOT DETECTED Final   Respiratory Syncytial Virus NOT DETECTED NOT DETECTED Final   Bordetella pertussis NOT DETECTED NOT DETECTED Final   Bordetella Parapertussis NOT DETECTED NOT DETECTED Final   Chlamydophila pneumoniae NOT DETECTED NOT DETECTED Final   Mycoplasma pneumoniae NOT DETECTED NOT DETECTED Final    Comment:  Performed at [facility], [address], [place], [place] [address]         Radiology Studies: ECHOCARDIOGRAM COMPLETE BUBBLE STUDY  Result Date: 07/22/2021    ECHOCARDIOGRAM REPORT   Patient Name:   DI BAGOT [facility] Date of Exam: 07/22/2021 Medical Rec #:  [id number]            Height:       62.0 in Accession #:    [id number]           Weight:       112.4 lb Date of Birth:  10/22/1950            BSA:          1.497 m Patient Age:  70 years             BP:           00/00 mmHg Patient Gender: F                    HR:           93 bpm. Exam Location:  Inpatient Procedure: 2D Echo Indications:    PFO  History:        Patient has prior history of Echocardiogram examinations, most                 recent 07/15/2021. COPD.  Sonographer:    Jefferey Pica Referring Phys: [phone] MURALI RAMASWAMY  Sonographer Comments: Image acquisition challenging due to respiratory motion. IMPRESSIONS  1. Left ventricular ejection fraction, by estimation, is 65 to 70%. The left ventricle has normal function. The left ventricle has no regional wall motion abnormalities. Left ventricular diastolic parameters are consistent with Grade I diastolic dysfunction (impaired relaxation).  2. Right ventricular systolic function is normal. The right ventricular size is normal.  3. The mitral valve is normal in structure. No evidence of mitral valve regurgitation. No evidence of mitral stenosis.  4. The aortic valve is tricuspid. There is mild calcification of the aortic valve. Aortic valve regurgitation is not visualized. Aortic valve sclerosis is present, with no evidence of aortic valve stenosis.  5. The inferior vena cava is normal in size with greater than 50% respiratory variability, suggesting right atrial pressure of 3 mmHg.  6. Agitated saline contrast bubble study was negative, with no evidence of any interatrial shunt. Comparison(s): No significant change from prior study. Prior images reviewed side by side. FINDINGS   Left Ventricle: Left ventricular ejection fraction, by estimation, is 65 to 70%. The left ventricle has normal function. The left ventricle has no regional wall motion abnormalities. The left ventricular internal cavity size was normal in size. There is  no left ventricular hypertrophy. Left ventricular diastolic parameters are consistent with Grade I diastolic dysfunction (impaired relaxation). Right Ventricle: The right ventricular size is normal. No increase in right ventricular wall thickness. Right ventricular systolic function is normal. Left Atrium: Left atrial size was normal in size. Right Atrium: Right atrial size was normal in size. Pericardium: There is no evidence of pericardial effusion. Mitral Valve: The mitral valve is normal in structure. No evidence of mitral valve regurgitation. No evidence of mitral valve stenosis. Tricuspid Valve: The tricuspid valve is normal in structure. Tricuspid valve regurgitation is not demonstrated. No evidence of tricuspid stenosis. Aortic Valve: The aortic valve is tricuspid. There is mild calcification of the aortic valve. Aortic valve regurgitation is not visualized. Aortic valve sclerosis is present, with no evidence of aortic valve stenosis. Aortic valve peak gradient measures 4.0 mmHg. Pulmonic Valve: The pulmonic valve was normal in structure. Pulmonic valve regurgitation is trivial. No evidence of pulmonic stenosis. Aorta: The aortic root is normal in size and structure. Venous: The inferior vena cava is normal in size with greater than 50% respiratory variability, suggesting right atrial pressure of 3 mmHg. IAS/Shunts: No atrial level shunt detected by color flow Doppler. Agitated saline contrast was given intravenously to evaluate for intracardiac shunting. Agitated saline contrast bubble study was negative, with no evidence of any interatrial shunt. There  is no evidence of Sabino Denning patent foramen ovale.  LEFT VENTRICLE PLAX 2D LVIDd:         3.60 cm   Diastology  LVIDs:  1.80 cm   LV e' medial:    6.16 cm/s LV PW:         1.10 cm   LV E/e' medial:  6.8 LV IVS:        1.00 cm   LV e' lateral:   8.92 cm/s LVOT diam:     1.90 cm   LV E/e' lateral: 4.7 LV SV:         46 LV SV Index:   31 LVOT Area:     2.84 cm  RIGHT VENTRICLE             IVC RV Basal diam:  2.40 cm     IVC diam: 0.80 cm RV S prime:     13.90 cm/s TAPSE (M-mode): 1.9 cm LEFT ATRIUM             Index        RIGHT ATRIUM          Index LA diam:        2.60 cm 1.74 cm/m   RA Area:     8.82 cm LA Vol (A2C):   30.0 ml 20.05 ml/m  RA Volume:   15.50 ml 10.36 ml/m LA Vol (A4C):   23.5 ml 15.70 ml/m LA Biplane Vol: 27.0 ml 18.04 ml/m  AORTIC VALVE                 PULMONIC VALVE AV Area (Vmax): 3.04 cm     PV Vmax:       0.97 m/s AV Vmax:        99.70 cm/s   PV Peak grad:  3.8 mmHg AV Peak Grad:   4.0 mmHg LVOT Vmax:      107.00 cm/s LVOT Vmean:     65.500 cm/s LVOT VTI:       0.164 m  AORTA Ao Root diam: 2.90 cm Ao Asc diam:  2.90 cm MITRAL VALVE MV Area (PHT): 3.31 cm    SHUNTS MV Decel Time: 229 msec    Systemic VTI:  0.16 m MV E velocity: 41.60 cm/s  Systemic Diam: 1.90 cm MV Lennette Fader velocity: 90.40 cm/s MV E/Shylee Durrett ratio:  0.46 Candee Furbish MD Electronically signed by Candee Furbish MD Signature Date/Time: 07/22/2021/1:16:58 PM    Final    VAS [place] LOWER EXTREMITY VENOUS (DVT)  Result Date: 07/22/2021  Lower Venous DVT Study Patient Name:  [facility]  Date of Exam:   07/22/2021 Medical Rec #: [id number]             Accession #:    [id number] Date of Birth: 24-Dec-1950             Patient Gender: F Patient Age:   71 years Exam Location:  [facility] Procedure:      VAS [place] LOWER EXTREMITY VENOUS (DVT) Referring Phys: MURALI RAMASWAMY --------------------------------------------------------------------------------  Indications: Hypoxia.  Risk Factors: HX of RLE DVT (2013). Comparison Study: Previous exam on 5.9.2014 was negative for DVT Performing Technologist: Rogelia Rohrer RVT, RDMS  Examination  Guidelines: Meegan Shanafelt complete evaluation includes B-mode imaging, spectral Doppler, color Doppler, and power Doppler as needed of all accessible portions of each vessel. Bilateral testing is considered an integral part of Shaday Rayborn complete examination. Limited examinations for reoccurring indications may be performed as noted. The reflux portion of the exam is performed with the patient in reverse Trendelenburg.  +---------+---------------+---------+-----------+----------+--------------+  RIGHT     Compressibility Phasicity Spontaneity Properties Thrombus Aging  +---------+---------------+---------+-----------+----------+--------------+  CFV  Full            Yes       Yes                                    +---------+---------------+---------+-----------+----------+--------------+  SFJ       Full                                                             +---------+---------------+---------+-----------+----------+--------------+  FV Prox   Full            Yes       Yes                                    +---------+---------------+---------+-----------+----------+--------------+  FV Mid    Full            Yes       Yes                                    +---------+---------------+---------+-----------+----------+--------------+  FV Distal Full            Yes       Yes                                    +---------+---------------+---------+-----------+----------+--------------+  PFV       Full                                                             +---------+---------------+---------+-----------+----------+--------------+  POP       Full            Yes       Yes                                    +---------+---------------+---------+-----------+----------+--------------+  PTV       Full                                                             +---------+---------------+---------+-----------+----------+--------------+  PERO      Full                                                              +---------+---------------+---------+-----------+----------+--------------+   +---------+---------------+---------+-----------+----------+--------------+  LEFT      Compressibility Phasicity Spontaneity Properties Thrombus Aging  +---------+---------------+---------+-----------+----------+--------------+  CFV       Full            Yes       Yes                                    +---------+---------------+---------+-----------+----------+--------------+  SFJ       Full                                                             +---------+---------------+---------+-----------+----------+--------------+  FV Prox   Full            Yes       Yes                                    +---------+---------------+---------+-----------+----------+--------------+  FV Mid    Full            Yes       Yes                                    +---------+---------------+---------+-----------+----------+--------------+  FV Distal Full            Yes       Yes                                    +---------+---------------+---------+-----------+----------+--------------+  PFV       Full                                                             +---------+---------------+---------+-----------+----------+--------------+  POP       Full            Yes       Yes                                    +---------+---------------+---------+-----------+----------+--------------+  PTV       Full                                                             +---------+---------------+---------+-----------+----------+--------------+  PERO      Full                                                             +---------+---------------+---------+-----------+----------+--------------+ Venous dilation in area of popliteal/ssv junction.    Summary: BILATERAL: -  No evidence of deep vein thrombosis seen in the lower extremities, bilaterally. - No evidence of superficial venous thrombosis in the lower extremities, bilaterally. -No evidence of popliteal cyst,  bilaterally.   *See table(s) above for measurements and observations. Electronically signed by Harold Barban MD on 07/22/2021 at 10:31:27 PM.    Final         Scheduled Meds:  budesonide (PULMICORT) nebulizer solution  0.25 mg Nebulization BID   busPIRone  15 mg Oral BID   enoxaparin (LOVENOX) injection  40 mg Subcutaneous Q24H   escitalopram  10 mg Oral Daily   furosemide  20 mg Oral Daily   guaiFENesin  1,200 mg Oral BID   ipratropium-albuterol  3 mL Nebulization Q6H   [START ON 07/24/2021] predniSONE  40 mg Oral Q breakfast   simvastatin  10 mg Oral Daily   sodium chloride flush  3 mL Intravenous Q12H   Continuous Infusions:   LOS: 8 days    Time spent: over 30 min    Fayrene Helper, MD [facility]   To contact the attending provider between 7A-7P or the covering provider during after hours 7P-7A, please log into the web site [email or web address] and access using universal [place] password for that web site. If you do not have the password, please call the hospital operator.  07/23/2021, 7:59 PM

## 2021-07-23 NOTE — Progress Notes (Signed)
Physical Therapy Treatment Patient Details Name: Tasha [name] MRN: [id number] DOB: 11/08/1950 Today's Date: 07/23/2021   History of Present Illness Patient is 71 year old female who presented with increased shortness of breath. patient was admitted with COPD exacerbation and acute respiratory failure with hypoxia on 07/14/21. PMH: COPD, asthma, depression, tobacco abuse, HTN, HLD, anxiety.    PT Comments    General Comments: AxO x 3 very pleasant and cooperative.  Pt currently in bed on 4 lts resting sats at 96% and HR 91.  RR 37.  Short and shallow.  Asissted OOB to amb in hallway.  General Gait Details: used a Rollator this session for trail use as pt may go home with O2 and to use Rollator seat for energy conservation.  Prior pt did not require any AD but does have a cane at home.  Pt amb 74 feet but needed 4 lts to achieve sats> 92%.  Avg HF was 103 but RR increased to low40's.  Instructed on purse lip breathing and energy conservation tech od sitting rest breaks and pacing self.  Pt appears immpulsive and unfamiliar with these stratageies. Assisted back to bed per pt request.   Pt will need ST Rehab at SNF prior to returning home.    SATURATION QUALIFICATIONS: (This note is used to comply with regulatory documentation for home oxygen)  Patient Saturations on Room Air at Rest =89%  Patient Saturations on Room Air while Ambulating  18 feet =  79%  Patient Saturations on 4 Liters of oxygen while Ambulating = 92% HR 103 RR 42  Please briefly explain why patient needs home oxygen: pt requires supplemental oxygen to achieve therapeutic levels.     Recommendations for follow up therapy are one component of a multi-disciplinary discharge planning process, led by the attending physician.  Recommendations may be updated based on patient status, additional functional criteria and insurance authorization.  Follow Up Recommendations  Skilled nursing-short term rehab (<3 hours/day)      Assistance Recommended at Discharge Frequent or constant Supervision/Assistance  Patient can return home with the following A little help with walking and/or transfers;Help with stairs or ramp for entrance;Assistance with cooking/housework;Assist for transportation;Direct supervision/assist for financial management;Direct supervision/assist for medications management   Equipment Recommendations  Rollator (4 wheels)    Recommendations for Other Services       Precautions / Restrictions Precautions Precautions: Fall Precaution Comments: monitor O2     Mobility  Bed Mobility Overal bed mobility: Modified Independent             General bed mobility comments: self able    Transfers Overall transfer level: Modified independent Equipment used: Rollator (4 wheels) Transfers: Sit to/from Stand Sit to Stand: Supervision, Modified independent (Device/Increase time)           General transfer comment: self able with good use of hands to steady self    Ambulation/Gait Ambulation/Gait assistance: Supervision Gait Distance (Feet): 74 Feet Assistive device: Rollator (4 wheels) Gait Pattern/deviations: Step-to pattern, Decreased stride length, Drifts right/left Gait velocity: decreased     General Gait Details: used a Rollator this session for trail use as pt may go home with O2 and to use Rollator seat for energy conservation.  Prior pt did not require any AD but does have a cane at home.  Pt amb 74 feet but needed 4 lts to achieve sats> 92%.  Avg HF was 103 but RR increased to low40's.  Instructed on purse lip breathing and energy conservation  tech od sitting rest breaks and pacing self.  Pt appears immpulsive and unfamiliar with these stratageies.   Stairs             Wheelchair Mobility    Modified Rankin (Stroke Patients Only)       Balance                                            Cognition Arousal/Alertness: Awake/alert Behavior  During Therapy: WFL for tasks assessed/performed Overall Cognitive Status: Within Functional Limits for tasks assessed                                 General Comments: AxO x 3 very pleasant and cooperative        Exercises      General Comments        Pertinent Vitals/Pain Pain Assessment Pain Assessment: No/denies pain    Home Living                          Prior Function            PT Goals (current goals can now be found in the care plan section) Progress towards PT goals: Progressing toward goals    Frequency    Min 3X/week      PT Plan Discharge plan needs to be updated    Co-evaluation              AM-PAC PT "6 Clicks" Mobility   Outcome Measure  Help needed turning from your back to your side while in a flat bed without using bedrails?: None Help needed moving from lying on your back to sitting on the side of a flat bed without using bedrails?: None Help needed moving to and from a bed to a chair (including a wheelchair)?: A Little Help needed standing up from a chair using your arms (e.g., wheelchair or bedside chair)?: A Little Help needed to walk in hospital room?: A Little Help needed climbing 3-5 steps with a railing? : A Little 6 Click Score: 20    End of Session Equipment Utilized During Treatment: Gait belt Activity Tolerance: Patient tolerated treatment well Patient left: in bed;with call bell/phone within reach;with bed alarm set Nurse Communication: Mobility status PT Visit Diagnosis: Difficulty in walking, not elsewhere classified (R26.2);Unsteadiness on feet (R26.81)     Time: 5750-5183 PT Time Calculation (min) (ACUTE ONLY): 18 min  Charges:  $Gait Training: 8-22 mins                     Felecia Shelling  PTA Acute  Rehabilitation Services Pager      [phone] Office      [phone]

## 2021-07-23 NOTE — Progress Notes (Signed)
Placed on BiPAP (auto mode via [place]). Patient apprehensive and very anxious but states she will try to wear it. Education provided on importance but patient tends to forget easily and needs plenty of reassurance as well redirection to leave the mask on. She is concerned of the mask being "stuck" and not being able to "get it off if I need to". Patient instructed on how to remove the mask in the event of nausea or vomiting but instructed to call for assistance for other reasons due to her oxygen requirements and the need for another oxygen source to be hooked up for her. She understands the importance of calling for assistance and is able to remove the mask in an emergency, which made her less anxious, However, she continued to hold tightly to the tubing and ask "can I take it off now?". Again, patient educated on importance of sleep with the device and elimination of toxic gases from her lungs and she becomes compliant with reassurance. Being present in the room seemed to make her more anxious so I informed her that I was stepping into the hallway for a few minutes. After 3-5 minutes of being out of the room, I stepped back in and she appeared to be comfortable with both arms resting down by her sides and watching the television. When asked if she feels different, she agrees "it's getting better but it's not comfortable". Adjustments made and velcro loosened for comfort. Lights turned down and patient left to rest.

## 2021-07-23 NOTE — TOC Progression Note (Addendum)
Transition of Care [facility]) - Progression Note    Patient Details  Name: Tasha [name] MRN: [id number] Date of Birth: 05/30/1951  Transition [facility]) CM/SW Contact  Cellie Dardis, Marjie Skiff, RN Phone Number: 07/23/2021, 12:42 PM  Clinical Narrative:    SNF bed offers provided to pt friend Manuela Schwartz per pt request. Manuela Schwartz to call me back with SNF bed choice. TOC will continue to follow.  Pasrr received: [id number]   Expected Discharge Plan: [place] Barriers to Discharge: Continued Medical Work up  Expected Discharge Plan and Services Expected Discharge Plan: [place]   Discharge Planning Services: CM Consult   Living arrangements for the past 2 months: Single Family Home Expected Discharge Date: 07/19/21               DME Arranged: Oxygen DME Agency: [employer] Date DME Agency Contacted: 07/19/21 Time DME Agency Contacted: [id number] Representative spoke with at DME Agency: Elzie Rings - # [phone] [place] Arranged: NA           Social Determinants of Health (Lupus) Interventions    Readmission Risk Interventions No flowsheet data found.

## 2021-07-24 DIAGNOSIS — R739 Hyperglycemia, unspecified: Secondary | ICD-10-CM

## 2021-07-24 DIAGNOSIS — J441 Chronic obstructive pulmonary disease with (acute) exacerbation: Secondary | ICD-10-CM | POA: Diagnosis not present

## 2021-07-24 DIAGNOSIS — R7303 Prediabetes: Secondary | ICD-10-CM | POA: Insufficient documentation

## 2021-07-24 LAB — BASIC METABOLIC PANEL
Anion gap: 5 (ref 5–15)
BUN: 36 mg/dL — ABNORMAL HIGH (ref 8–23)
CO2: 34 mmol/L — ABNORMAL HIGH (ref 22–32)
Calcium: 9.2 mg/dL (ref 8.9–10.3)
Chloride: 98 mmol/L (ref 98–111)
Creatinine, Ser: 0.83 mg/dL (ref 0.44–1.00)
GFR, Estimated: >60 mL/min (ref >60)
Glucose, Bld: 173 mg/dL — ABNORMAL HIGH (ref 70–99)
Potassium: 3.9 mmol/L (ref 3.5–5.1)
Sodium: 137 mmol/L (ref 135–145)

## 2021-07-24 LAB — GLUCOSE, CAPILLARY
Glucose-Capillary: 148 mg/dL — ABNORMAL HIGH (ref 70–99)
Glucose-Capillary: 142 mg/dL — ABNORMAL HIGH (ref 70–99)
Glucose-Capillary: 159 mg/dL — ABNORMAL HIGH (ref 70–99)

## 2021-07-24 LAB — CBC
HCT: 41.3 % (ref 36.0–46.0)
Hemoglobin: 13.5 g/dL (ref 12.0–15.0)
MCH: 31.2 pg (ref 26.0–34.0)
MCHC: 32.7 g/dL (ref 30.0–36.0)
MCV: 95.4 fL (ref 80.0–100.0)
Platelets: 250 10*3/uL (ref 150–400)
RBC: 4.33 MIL/uL (ref 3.87–5.11)
RDW: 13.9 % (ref 11.5–15.5)
WBC: 12.3 10*3/uL — ABNORMAL HIGH (ref 4.0–10.5)
nRBC: 0 % (ref 0.0–0.2)

## 2021-07-24 MED ORDER — INSULIN ASPART 100 UNIT/ML IJ SOLN
0.0000 [IU] | Freq: Three times a day (TID) | INTRAMUSCULAR | Status: DC
  Administered 2021-07-24 (×2): 1 [IU] via SUBCUTANEOUS
  Administered 2021-07-25: 3 [IU] via SUBCUTANEOUS
  Administered 2021-07-25 – 2021-07-26 (×4): 2 [IU] via SUBCUTANEOUS

## 2021-07-24 MED ORDER — PREDNISONE 20 MG PO TABS: ORAL_TABLET | ORAL | 0 refills | Status: AC

## 2021-07-24 MED ORDER — FUROSEMIDE 20 MG PO TABS: 20.0000 mg | ORAL_TABLET | Freq: Every day | ORAL | 0 refills | Status: AC

## 2021-07-24 MED ORDER — INSULIN ASPART 100 UNIT/ML IJ SOLN: 0.0000 [IU] | Freq: Every day | INTRAMUSCULAR | Status: DC

## 2021-07-24 NOTE — Progress Notes (Signed)
Patient was supposed to discharge to [facility] tonight, but she will not be discharging as planned. Writer called [facility] and spoke to Emy to let them know that the patient will not be coming there tonight.

## 2021-07-24 NOTE — Discharge Summary (Signed)
Physician Discharge Summary  Mariadelosangel Trauger Mires [id number] DOB: [birth year] DOA: 07/14/2021  PCP: Darreld Mclean, MD  Admit date: 07/14/2021 Discharge date: 07/24/2021  Time spent: 40 minutes  Recommendations for Outpatient Follow-up:  Follow outpatient CBC/CMP Follow A1c pending Follow alpha 1 anti trypsin  Continue to wean o2 as tolerated (at discharge requiring 2 L at rest and 4 L with activity) Follow adrenal nodule outpatient  Discharge Diagnoses:  Principal Problem:   COPD exacerbation ([place]) Active Problems:   Acute on chronic respiratory failure with hypoxia and hypercapnia (HCC)   Anxiety and depression   Hypercholesteremia   Adrenal nodule ([place])   Hyperglycemia   Discharge Condition: stable  Diet recommendation: heart healthy  Filed Weights   07/14/21 1436 07/14/21 2302  Weight: 50.3 kg 51 kg    History of present illness:  Damitra Santry is Leamon Palau 71 y.o. female with PMH significant for COPD, chronic smoking, HTN, HLD, anxiety, depression, arthritis, GERD, DVT, scoliosis who lives at home alone.  She was brought in for worsening shortness of breath and cough.  She was found to have Yanique Mulvihill COPD exacerbation and has slowly improved with steroids and nebs.  Pulm was consulted and plans for outpatient follow up.  She's gradually improved, now requiring 2 L at rest and 4 L with activity.  She'll discharge with continued plans for outpatient follow up.   See below for additional details  Hospital Course:  Assessment and Plan: * COPD exacerbation ([place])- (present on admission) As above  Acute on chronic respiratory failure with hypoxia and hypercapnia (HCC) Due to COPD exacerbation Hx COPD, emphysema, was not on nebs at home and no pulmonologist Smoking up until admission Currently requiring 2 L at rest (requiring 4 L with activity based on recent PT eval) Continue to wean o2 as tolerated for sat goal 88-94% (required as much as 8 L during this  hospitalization) CT 2/20 with centrilobular emphysema with mild diffuse bronchial wall thickening and scattered mucous plugging c/w smoking related lung disease Will discharge with steroid taper Will continue diuresis as tolerated, doesn't appear overtly overloaded pulm consult, appreciate assistance pending alpha 1 anti trypsin study Needs to follow outpatient with pulm Lactate pending, nocturnal NIPPV Echo with EF [id number], grade 1 diastolic dysfunction, negative bubble study Negative LE [place]        Anxiety and depression- (present on admission) Buspar, lexapro, atarax  Hypercholesteremia- (present on admission) simvastatin  Adrenal nodule ([place]) Noted on CT scan on L Follow with PCP outpatient  Hyperglycemia Steroid induced A1c pending at discharge Follow outpatient    Procedures: Echo IMPRESSIONS     1. Left ventricular ejection fraction, by estimation, is 65 to 70%. The  left ventricle has normal function. The left ventricle has no regional  wall motion abnormalities. Left ventricular diastolic parameters are  consistent with Grade I diastolic  dysfunction (impaired relaxation).   2. Right ventricular systolic function is normal. The right ventricular  size is normal.   3. The mitral valve is normal in structure. No evidence of mitral valve  regurgitation. No evidence of mitral stenosis.   4. The aortic valve is tricuspid. There is mild calcification of the  aortic valve. Aortic valve regurgitation is not visualized. Aortic valve  sclerosis is present, with no evidence of aortic valve stenosis.   5. The inferior vena cava is normal in size with greater than 50%  respiratory variability, suggesting right atrial pressure of 3 mmHg.   6. Agitated saline contrast bubble study  was negative, with no evidence  of any interatrial shunt.   Comparison(s): No significant change from prior study. Prior images  reviewed side by side.   LE [place] Summary:  BILATERAL:  - No  evidence of deep vein thrombosis seen in the lower extremities,  bilaterally.  - No evidence of superficial venous thrombosis in the lower extremities,  bilaterally.  -No evidence of popliteal cyst, bilaterally.   Consultations: pulm  Discharge Exam: Vitals:   07/24/21 0753 07/24/21 0816  BP:    Pulse: 81   Resp:    Temp:    SpO2: 92% 93%   No new complaints Asked me to call susan, called, but no answer  General: No acute distress. Cardiovascular: RRR Lungs: mild tachypnea when we were discussing discharge, CTAB, I think tachypnea was anxiety, as seemed to improve after I left room Abdomen: Soft, nontender, nondistended  Neurological: Alert and oriented 3. Moves all extremities 4 . Cranial nerves II through XII grossly intact. Skin: Warm and dry. No rashes or lesions. Extremities: No clubbing or cyanosis. No edema  Discharge Instructions   Discharge Instructions     Ambulatory referral to Pulmonology   Complete by: As directed    Reason for referral: Asthma/COPD   Call MD for:  difficulty breathing, headache or visual disturbances   Complete by: As directed    Call MD for:  extreme fatigue   Complete by: As directed    Call MD for:  hives   Complete by: As directed    Call MD for:  persistant dizziness or light-headedness   Complete by: As directed    Call MD for:  persistant nausea and vomiting   Complete by: As directed    Call MD for:  redness, tenderness, or signs of infection (pain, swelling, redness, odor or green/yellow discharge around incision site)   Complete by: As directed    Call MD for:  severe uncontrolled pain   Complete by: As directed    Call MD for:  temperature >100.4   Complete by: As directed    Diet - low sodium heart healthy   Complete by: As directed    Diet - low sodium heart healthy   Complete by: As directed    Discharge instructions   Complete by: As directed    You were seen for Elsia Lasota COPD exacerbation.  You've improved  gradually with steroids and nebs.  You're being discharged with 2 Liters of oxygen to use at rest, you should use 4 L with activity.  You should continue to follow up with pulmonology outpatient to wean your oxygen as tolerated.    We've started oral lasix for you which can be continued outpatient.  Follow repeat labs within about week to check on your kidney function.  We're stopping the HCTZ.  You'll need to follow with the lung doctors outpatient.  You have labs pending that you should follow up with the lung doctors.  We've started you on breo ellipta outpatient.  This is Verlaine Embry controller medicine for your COPD and you should take this every day.  Start taking albuterol as needed.  We're tapering your steroids.  You have an adrenal nodule which should be followed up with your PCP outpatient.  Return for new, recurrent, or worsening symptoms.  Please ask your PCP to request records from this hospitalization so they know what was done and what the next steps will be.   Increase activity slowly   Complete by: As directed  Increase activity slowly   Complete by: As directed       Allergies as of 07/24/2021       Reactions   Acitretin Swelling, Rash   Pollen Extract Shortness Of Breath   Leaves, pt. reports   Percodan [oxycodone-aspirin]    Pt does not remember   Statins Other (See Comments)   Muscle aches in legs        Medication List     STOP taking these medications    risedronate 35 MG tablet Commonly known as: ACTONEL   triamcinolone cream 0.1 % Commonly known as: KENALOG       TAKE these medications    albuterol 108 (90 Base) MCG/ACT inhaler Commonly known as: VENTOLIN HFA Inhale 2 puffs into the lungs every 4 (four) hours as needed for wheezing or shortness of breath.   aspirin EC 81 MG tablet Take 81 mg by mouth daily. Swallow whole.   busPIRone 15 MG tablet Commonly known as: BUSPAR Take 1 tablet (15 mg total) by mouth 2 (two) times daily. What  changed:  when to take this reasons to take this   escitalopram 10 MG tablet Commonly known as: LEXAPRO Take 1 tablet (10 mg total) by mouth daily.   fluticasone furoate-vilanterol 200-25 MCG/ACT Aepb Commonly known as: BREO ELLIPTA Inhale 1 puff into the lungs daily.   furosemide 20 MG tablet Commonly known as: LASIX Take 1 tablet (20 mg total) by mouth daily. Start taking on: July 25, 2021   guaiFENesin 600 MG 12 hr tablet Commonly known as: MUCINEX Take 1 tablet (600 mg total) by mouth 2 (two) times daily for 7 days.   ipratropium-albuterol 0.5-2.5 (3) MG/3ML Soln Commonly known as: DUONEB INHALE 3MLS VIA NEBULIZER EVERY 6 HOURS AS NEEDED   magnesium 30 MG tablet Take 30 mg by mouth daily.   Multivitamin Adults 50+ Tabs Take 1 tablet by mouth 3 (three) times Merelyn Klump week.   predniSONE 20 MG tablet Commonly known as: DELTASONE Take 2 tablets (40 mg total) by mouth daily with breakfast for 3 days, THEN 1.5 tablets (30 mg total) daily with breakfast for 3 days, THEN 1 tablet (20 mg total) daily with breakfast for 3 days, THEN 0.5 tablets (10 mg total) daily with breakfast for 3 days. Start taking on: July 25, 2021   PRIMATENE MIST IN Inhale 1 puff into the lungs 3 (three) times daily as needed (sob/wheezing).   simvastatin 10 MG tablet Commonly known as: ZOCOR Take 1 tablet (10 mg total) by mouth daily.               Durable Medical Equipment  (From admission, onward)           Start     Ordered   07/19/21 0945  For home use only DME oxygen  Once       Question Answer Comment  Length of Need Lifetime   Oxygen delivery system Gas      07/19/21 0944           Allergies  Allergen Reactions   Acitretin Swelling and Rash   Pollen Extract Shortness Of Breath    Leaves, pt. reports   Percodan [Oxycodone-Aspirin]     Pt does not remember   Statins Other (See Comments)    Muscle aches in legs     Contact information for follow-up providers      Copland, Gay Filler, MD Follow up.   Specialty: Family Medicine Contact information: [address] [place] [address] [phone]         [facility] Follow up.   Specialty: Pulmonology Contact information: [address] [place] [id number] [phone]             Contact information for after-discharge care     Destination     [facility] Preferred SNF .   Service: Skilled Nursing Contact information: [place] [phone]                      The results of significant diagnostics from this hospitalization (including imaging, microbiology, ancillary and laboratory) are listed below for reference.    Significant Diagnostic Studies: CT CHEST W CONTRAST  Result Date: 07/20/2021 CLINICAL DATA:  Dyspnea, chronic, unclear etiology EXAM: CT CHEST WITH CONTRAST TECHNIQUE: Multidetector CT imaging of the chest was performed during intravenous contrast administration. RADIATION DOSE REDUCTION: This exam was performed according to the departmental dose-optimization program which includes automated exposure control, adjustment of the mA and/or kV according to patient size and/or use of iterative reconstruction technique. CONTRAST:  73mL OMNIPAQUE IOHEXOL 300 MG/ML  SOLN COMPARISON:  CT 02/12/2019 FINDINGS: Cardiovascular: Normal cardiac size.No pericardial disease.Normal size main and branch pulmonary arteries.Mild atherosclerotic calcifications of the aortic arch and descending aorta. Mediastinum/Nodes: No lymphadenopathy.The thyroid is unremarkable.Esophagus is unremarkable. Lungs/Pleura: The trachea is unremarkable. There is mild diffuse bronchial wall thickening. Scattered mucous plugging.There is apical predominant centrilobular emphysema. There is scarring and subsegmental atelectasis along the medial aspects of the upper lobes bilaterally.No  suspicious pulmonary nodules or masses.No pleural effusion. No pneumothorax. Upper Abdomen: No acute abnormality. Small left hepatic cysts. Small bilateral renal cysts. There is Wane Mollett left adrenal nodule measuring up to 1.6 cm, similar in appearance to prior CT in September 2020, unchanged in size (series 2, image 137). Musculoskeletal: No acute osseous abnormality.No suspicious lytic or blastic lesions. Multilevel degenerative changes of the spine. IMPRESSION: Centrilobular emphysema with mild diffuse bronchial wall thickening and scattered mucous plugging consistent with smoking related lung disease. Medial upper lobe subsegmental atelectasis and scarring bilaterally. No suspicious pulmonary nodules or masses. Aortic Atherosclerosis (ICD10-I70.0) and Emphysema (ICD10-J43.9). Unchanged left adrenal nodule in comparison prior CT in September 2020. Electronically Signed   By: Maurine Simmering M.D.   On: 07/20/2021 11:31   DG Chest Port 1 View  Result Date: 07/14/2021 CLINICAL DATA:  COPD EXAM: PORTABLE CHEST 1 VIEW COMPARISON:  None. FINDINGS: Normal mediastinum and cardiac silhouette. Normal pulmonary vasculature. No evidence of effusion, infiltrate, or pneumothorax. No acute bony abnormality. IMPRESSION: No acute cardiopulmonary process. Electronically Signed   By: Suzy Bouchard M.D.   On: 07/14/2021 15:24   ECHOCARDIOGRAM COMPLETE  Result Date: 07/15/2021    ECHOCARDIOGRAM REPORT   Patient Name:   TIERSA MILANI [facility] Date of Exam: 07/15/2021 Medical Rec #:  [id number]            Height:       62.0 in Accession #:    [id number]           Weight:       112.4 lb Date of Birth:  [birth year]            BSA:          1.497 m Patient Age:    88 years             BP:  154/79 mmHg Patient Gender: F                    HR:           100 bpm. Exam Location:  Inpatient Procedure: 2D Echo, Cardiac Doppler and Color Doppler Indications:    Dyspnea  History:        Patient has no prior history of Echocardiogram  examinations.                 COPD; Risk Factors:Hypertension.  Sonographer:    Jyl Heinz Referring Phys: [id number] [place]  1. Left ventricular ejection fraction, by estimation, is 65 to 70%. The left ventricle has normal function. The left ventricle has no regional wall motion abnormalities. Left ventricular diastolic parameters are consistent with Grade I diastolic dysfunction (impaired relaxation).  2. Right ventricular systolic function is normal. The right ventricular size is normal.  3. The mitral valve is normal in structure. Trivial mitral valve regurgitation. No evidence of mitral stenosis.  4. The aortic valve is tricuspid. There is mild calcification of the aortic valve. Aortic valve regurgitation is not visualized. Aortic valve sclerosis/calcification is present, without any evidence of aortic stenosis.  5. The inferior vena cava is normal in size with greater than 50% respiratory variability, suggesting right atrial pressure of 3 mmHg.  6. 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 Cannot exclude Maricsa Sammons small PFO. FINDINGS  Left Ventricle: Left ventricular ejection fraction, by estimation, is 65 to 70%. The left ventricle has normal function. The left ventricle has no regional wall motion abnormalities. The left ventricular internal cavity size was normal in size. There is  no left ventricular hypertrophy. Left ventricular diastolic parameters are consistent with Grade I diastolic dysfunction (impaired relaxation). Right Ventricle: The right ventricular size is normal. No increase in right ventricular wall thickness. Right ventricular systolic function is normal. Left Atrium: Left atrial size was normal in size. Right Atrium: Right atrial size was normal in size. Pericardium: There is no evidence of pericardial effusion. Mitral Valve: The mitral valve is normal in structure. Trivial mitral valve regurgitation. No evidence of mitral valve stenosis. Tricuspid Valve: The tricuspid valve is normal in  structure. Tricuspid valve regurgitation is trivial. No evidence of tricuspid stenosis. Aortic Valve: The aortic valve is tricuspid. There is mild calcification of the aortic valve. Aortic valve regurgitation is not visualized. Aortic valve sclerosis/calcification is present, without any evidence of aortic stenosis. Aortic valve peak gradient measures 6.8 mmHg. Pulmonic Valve: The pulmonic valve was grossly normal. Pulmonic valve regurgitation is mild. No evidence of pulmonic stenosis. Aorta: The aortic root is normal in size and structure. Venous: The inferior vena cava is normal in size with greater than 50% respiratory variability, suggesting right atrial pressure of 3 mmHg. IAS/Shunts: The interatrial septum is aneurysmal. Cannot exclude Daisy Lites small PFO.  LEFT VENTRICLE PLAX 2D LVIDd:         4.40 cm     Diastology LVIDs:         3.00 cm     LV e' medial:    8.16 cm/s LV PW:         1.00 cm     LV E/e' medial:  7.8 LV IVS:        1.10 cm     LV e' lateral:   10.40 cm/s LVOT diam:     1.80 cm     LV E/e' lateral: 6.1 LV SV:  56 LV SV Index:   37 LVOT Area:     2.54 cm  LV Volumes (MOD) LV vol d, MOD A2C: 81.1 ml LV vol d, MOD A4C: 80.4 ml LV vol s, MOD A2C: 30.2 ml LV vol s, MOD A4C: 30.4 ml LV SV MOD A2C:     50.9 ml LV SV MOD A4C:     80.4 ml LV SV MOD BP:      49.5 ml RIGHT VENTRICLE             IVC RV Basal diam:  3.10 cm     IVC diam: 1.80 cm RV Mid diam:    3.00 cm RV S prime:     17.60 cm/s TAPSE (M-mode): 2.0 cm LEFT ATRIUM             Index        RIGHT ATRIUM           Index LA diam:        3.30 cm 2.20 cm/m   RA Area:     10.60 cm LA Vol (A2C):   23.9 ml 15.97 ml/m  RA Volume:   21.70 ml  14.50 ml/m LA Vol (A4C):   28.4 ml 18.98 ml/m LA Biplane Vol: 27.1 ml 18.11 ml/m  AORTIC VALVE AV Area (Vmax): 2.27 cm AV Vmax:        130.00 cm/s AV Peak Grad:   6.8 mmHg LVOT Vmax:      116.00 cm/s LVOT Vmean:     81.400 cm/s LVOT VTI:       0.219 m  AORTA Ao Root diam: 2.90 cm Ao Asc diam:  2.80 cm  MITRAL VALVE               TRICUSPID VALVE MV Area (PHT): 4.57 cm    TR Peak grad:   23.4 mmHg MV Decel Time: 166 msec    TR Vmax:        242.00 cm/s MV E velocity: 63.40 cm/s MV Furman Trentman velocity: 86.10 cm/s  SHUNTS MV E/Rocquel Askren ratio:  0.74        Systemic VTI:  0.22 m                            Systemic Diam: 1.80 cm Glori Bickers MD Electronically signed by Glori Bickers MD Signature Date/Time: 07/15/2021/6:28:58 PM    Final    ECHOCARDIOGRAM COMPLETE BUBBLE STUDY  Result Date: 07/22/2021    ECHOCARDIOGRAM REPORT   Patient Name:   TEMPIE DELAPUENTE [facility] Date of Exam: 07/22/2021 Medical Rec #:  [id number]            Height:       62.0 in Accession #:    [id number]           Weight:       112.4 lb Date of Birth:  03/04/1951            BSA:          1.497 m Patient Age:    71 years             BP:           00/00 mmHg Patient Gender: F                    HR:           93 bpm. Exam Location:  Inpatient Procedure: 2D Echo Indications:  PFO  History:        Patient has prior history of Echocardiogram examinations, most                 recent 07/15/2021. COPD.  Sonographer:    Jefferey Pica Referring Phys: [phone] MURALI RAMASWAMY  Sonographer Comments: Image acquisition challenging due to respiratory motion. IMPRESSIONS  1. Left ventricular ejection fraction, by estimation, is 65 to 70%. The left ventricle has normal function. The left ventricle has no regional wall motion abnormalities. Left ventricular diastolic parameters are consistent with Grade I diastolic dysfunction (impaired relaxation).  2. Right ventricular systolic function is normal. The right ventricular size is normal.  3. The mitral valve is normal in structure. No evidence of mitral valve regurgitation. No evidence of mitral stenosis.  4. The aortic valve is tricuspid. There is mild calcification of the aortic valve. Aortic valve regurgitation is not visualized. Aortic valve sclerosis is present, with no evidence of aortic valve stenosis.  5. The inferior  vena cava is normal in size with greater than 50% respiratory variability, suggesting right atrial pressure of 3 mmHg.  6. Agitated saline contrast bubble study was negative, with no evidence of any interatrial shunt. Comparison(s): No significant change from prior study. Prior images reviewed side by side. FINDINGS  Left Ventricle: Left ventricular ejection fraction, by estimation, is 65 to 70%. The left ventricle has normal function. The left ventricle has no regional wall motion abnormalities. The left ventricular internal cavity size was normal in size. There is  no left ventricular hypertrophy. Left ventricular diastolic parameters are consistent with Grade I diastolic dysfunction (impaired relaxation). Right Ventricle: The right ventricular size is normal. No increase in right ventricular wall thickness. Right ventricular systolic function is normal. Left Atrium: Left atrial size was normal in size. Right Atrium: Right atrial size was normal in size. Pericardium: There is no evidence of pericardial effusion. Mitral Valve: The mitral valve is normal in structure. No evidence of mitral valve regurgitation. No evidence of mitral valve stenosis. Tricuspid Valve: The tricuspid valve is normal in structure. Tricuspid valve regurgitation is not demonstrated. No evidence of tricuspid stenosis. Aortic Valve: The aortic valve is tricuspid. There is mild calcification of the aortic valve. Aortic valve regurgitation is not visualized. Aortic valve sclerosis is present, with no evidence of aortic valve stenosis. Aortic valve peak gradient measures 4.0 mmHg. Pulmonic Valve: The pulmonic valve was normal in structure. Pulmonic valve regurgitation is trivial. No evidence of pulmonic stenosis. Aorta: The aortic root is normal in size and structure. Venous: The inferior vena cava is normal in size with greater than 50% respiratory variability, suggesting right atrial pressure of 3 mmHg. IAS/Shunts: No atrial level shunt  detected by color flow Doppler. Agitated saline contrast was given intravenously to evaluate for intracardiac shunting. Agitated saline contrast bubble study was negative, with no evidence of any interatrial shunt. There  is no evidence of Jayke Caul patent foramen ovale.  LEFT VENTRICLE PLAX 2D LVIDd:         3.60 cm   Diastology LVIDs:         1.80 cm   LV e' medial:    6.16 cm/s LV PW:         1.10 cm   LV E/e' medial:  6.8 LV IVS:        1.00 cm   LV e' lateral:   8.92 cm/s LVOT diam:     1.90 cm   LV E/e' lateral: 4.7 LV SV:  46 LV SV Index:   31 LVOT Area:     2.84 cm  RIGHT VENTRICLE             IVC RV Basal diam:  2.40 cm     IVC diam: 0.80 cm RV S prime:     13.90 cm/s TAPSE (M-mode): 1.9 cm LEFT ATRIUM             Index        RIGHT ATRIUM          Index LA diam:        2.60 cm 1.74 cm/m   RA Area:     8.82 cm LA Vol (A2C):   30.0 ml 20.05 ml/m  RA Volume:   15.50 ml 10.36 ml/m LA Vol (A4C):   23.5 ml 15.70 ml/m LA Biplane Vol: 27.0 ml 18.04 ml/m  AORTIC VALVE                 PULMONIC VALVE AV Area (Vmax): 3.04 cm     PV Vmax:       0.97 m/s AV Vmax:        99.70 cm/s   PV Peak grad:  3.8 mmHg AV Peak Grad:   4.0 mmHg LVOT Vmax:      107.00 cm/s LVOT Vmean:     65.500 cm/s LVOT VTI:       0.164 m  AORTA Ao Root diam: 2.90 cm Ao Asc diam:  2.90 cm MITRAL VALVE MV Area (PHT): 3.31 cm    SHUNTS MV Decel Time: 229 msec    Systemic VTI:  0.16 m MV E velocity: 41.60 cm/s  Systemic Diam: 1.90 cm MV Coda Filler velocity: 90.40 cm/s MV E/Breyon Sigg ratio:  0.46 Candee Furbish MD Electronically signed by Candee Furbish MD Signature Date/Time: 07/22/2021/1:16:58 PM    Final    VAS [place] LOWER EXTREMITY VENOUS (DVT)  Result Date: 07/22/2021  Lower Venous DVT Study Patient Name:  KENSLEE CHROMY [facility]  Date of Exam:   07/22/2021 Medical Rec #: [id number]             Accession #:    [id number] Date of Birth: July 08, 1950             Patient Gender: F Patient Age:   42 years Exam Location:  [facility] Procedure:      VAS [place] LOWER  EXTREMITY VENOUS (DVT) Referring Phys: MURALI RAMASWAMY --------------------------------------------------------------------------------  Indications: Hypoxia.  Risk Factors: HX of RLE DVT (2013). Comparison Study: Previous exam on 5.9.2014 was negative for DVT Performing Technologist: Rogelia Rohrer RVT, RDMS  Examination Guidelines: Stiven Kaspar complete evaluation includes B-mode imaging, spectral Doppler, color Doppler, and power Doppler as needed of all accessible portions of each vessel. Bilateral testing is considered an integral part of Sanela Evola complete examination. Limited examinations for reoccurring indications may be performed as noted. The reflux portion of the exam is performed with the patient in reverse Trendelenburg.  +---------+---------------+---------+-----------+----------+--------------+  RIGHT     Compressibility Phasicity Spontaneity Properties Thrombus Aging  +---------+---------------+---------+-----------+----------+--------------+  CFV       Full            Yes       Yes                                    +---------+---------------+---------+-----------+----------+--------------+  SFJ       Full                                                             +---------+---------------+---------+-----------+----------+--------------+  FV Prox   Full            Yes       Yes                                    +---------+---------------+---------+-----------+----------+--------------+  FV Mid    Full            Yes       Yes                                    +---------+---------------+---------+-----------+----------+--------------+  FV Distal Full            Yes       Yes                                    +---------+---------------+---------+-----------+----------+--------------+  PFV       Full                                                             +---------+---------------+---------+-----------+----------+--------------+  POP       Full            Yes       Yes                                     +---------+---------------+---------+-----------+----------+--------------+  PTV       Full                                                             +---------+---------------+---------+-----------+----------+--------------+  PERO      Full                                                             +---------+---------------+---------+-----------+----------+--------------+   +---------+---------------+---------+-----------+----------+--------------+  LEFT      Compressibility Phasicity Spontaneity Properties Thrombus Aging  +---------+---------------+---------+-----------+----------+--------------+  CFV       Full            Yes       Yes                                    +---------+---------------+---------+-----------+----------+--------------+  SFJ       Full                                                             +---------+---------------+---------+-----------+----------+--------------+  FV Prox   Full            Yes       Yes                                    +---------+---------------+---------+-----------+----------+--------------+  FV Mid    Full            Yes       Yes                                    +---------+---------------+---------+-----------+----------+--------------+  FV Distal Full            Yes       Yes                                    +---------+---------------+---------+-----------+----------+--------------+  PFV       Full                                                             +---------+---------------+---------+-----------+----------+--------------+  POP       Full            Yes       Yes                                    +---------+---------------+---------+-----------+----------+--------------+  PTV       Full                                                             +---------+---------------+---------+-----------+----------+--------------+  PERO      Full                                                              +---------+---------------+---------+-----------+----------+--------------+ Venous dilation in area of popliteal/ssv junction.    Summary: BILATERAL: - No evidence of deep vein thrombosis seen in the lower extremities, bilaterally. - No evidence of superficial venous thrombosis in the lower extremities, bilaterally. -No evidence of popliteal cyst, bilaterally.   *See table(s) above for measurements and observations. Electronically signed by Harold Barban MD on 07/22/2021 at 10:31:27 PM.    Final     Microbiology: Recent Results (from the past 240 hour(s))  Resp Panel by RT-PCR (Flu Sargent Mankey&B, Covid) Nasopharyngeal Swab     Status: None   Collection Time: 07/14/21  2:33 PM   Specimen: Nasopharyngeal Swab; Nasopharyngeal(NP) swabs in vial transport medium  Result Value Ref Range Status   SARS Coronavirus 2 by RT PCR NEGATIVE NEGATIVE Final    Comment: (NOTE) SARS-CoV-2 target nucleic acids are NOT DETECTED.  The  SARS-CoV-2 RNA is generally detectable in upper respiratory specimens during the acute phase of infection. The lowest concentration of SARS-CoV-2 viral copies this assay can detect is 138 copies/mL. Antwaine Boomhower negative result does not preclude SARS-Cov-2 infection and should not be used as the sole basis for treatment or other patient management decisions. Haylo Fake negative result may occur with  improper specimen collection/handling, submission of specimen other than nasopharyngeal swab, presence of viral mutation(s) within the areas targeted by this assay, and inadequate number of viral copies(<138 copies/mL). Maddie Brazier negative result must be combined with clinical observations, patient history, and epidemiological information. The expected result is Negative.  Fact Sheet for Patients:  EntrepreneurPulse.com.au  Fact Sheet for Healthcare Providers:  IncredibleEmployment.be  This test is no t yet approved or cleared by the [place] FDA and  has been authorized for  detection and/or diagnosis of SARS-CoV-2 by FDA under an Emergency Use Authorization (EUA). This EUA will remain  in effect (meaning this test can be used) for the duration of the COVID-19 declaration under Section 564(b)(1) of the Act, 21 U.S.C.section 360bbb-3(b)(1), unless the authorization is terminated  or revoked sooner.       Influenza Mikias Lanz by PCR NEGATIVE NEGATIVE Final   Influenza B by PCR NEGATIVE NEGATIVE Final    Comment: (NOTE) The Xpert Xpress SARS-CoV-2/FLU/RSV plus assay is intended as an aid in the diagnosis of influenza from Nasopharyngeal swab specimens and should not be used as Olanrewaju Osborn sole basis for treatment. Nasal washings and aspirates are unacceptable for Xpert Xpress SARS-CoV-2/FLU/RSV testing.  Fact Sheet for Patients: EntrepreneurPulse.com.au  Fact Sheet for Healthcare Providers: IncredibleEmployment.be  This test is not yet approved or cleared by the [place] FDA and has been authorized for detection and/or diagnosis of SARS-CoV-2 by FDA under an Emergency Use Authorization (EUA). This EUA will remain in effect (meaning this test can be used) for the duration of the COVID-19 declaration under Section 564(b)(1) of the Act, 21 U.S.C. section 360bbb-3(b)(1), unless the authorization is terminated or revoked.  Performed at [facility], [place] [address] [place], [place] [address]   Respiratory (~20 pathogens) panel by PCR     Status: None   Collection Time: 07/14/21  6:47 PM   Specimen: Nasopharyngeal Swab; Respiratory  Result Value Ref Range Status   Adenovirus NOT DETECTED NOT DETECTED Final   Coronavirus 229E NOT DETECTED NOT DETECTED Final    Comment: (NOTE) The Coronavirus on the Respiratory Panel, DOES NOT test for the novel  Coronavirus (2019 nCoV)    Coronavirus HKU1 NOT DETECTED NOT DETECTED Final   Coronavirus NL63 NOT DETECTED NOT DETECTED Final   Coronavirus OC43 NOT DETECTED NOT  DETECTED Final   Metapneumovirus NOT DETECTED NOT DETECTED Final   Rhinovirus / Enterovirus NOT DETECTED NOT DETECTED Final   Influenza Ralyn Stlaurent NOT DETECTED NOT DETECTED Final   Influenza B NOT DETECTED NOT DETECTED Final   Parainfluenza Virus 1 NOT DETECTED NOT DETECTED Final   Parainfluenza Virus 2 NOT DETECTED NOT DETECTED Final   Parainfluenza Virus 3 NOT DETECTED NOT DETECTED Final   Parainfluenza Virus 4 NOT DETECTED NOT DETECTED Final   Respiratory Syncytial Virus NOT DETECTED NOT DETECTED Final   Bordetella pertussis NOT DETECTED NOT DETECTED Final   Bordetella Parapertussis NOT DETECTED NOT DETECTED Final   Chlamydophila pneumoniae NOT DETECTED NOT DETECTED Final   Mycoplasma pneumoniae NOT DETECTED NOT DETECTED Final    Comment: Performed at [facility], [place]. [address], [place], [place] [address]  Labs: Basic Metabolic Panel: Recent Labs  Lab 07/21/21 0538 07/22/21 1313 07/23/21 0600 07/24/21 0550  NA  --  136 135 137  K  --  4.2 4.7 3.9  CL  --  95* 95* 98  CO2  --  34* 34* 34*  GLUCOSE  --  158* 166* 173*  BUN  --  37* 40* 36*  CREATININE 0.76 0.82 0.85 0.83  CALCIUM  --  9.9 9.4 9.2  MG  --   --  2.4  --   PHOS  --   --  3.2  --    Liver Function Tests: Recent Labs  Lab 07/23/21 0600  AST 16  ALT 29  ALKPHOS 85  BILITOT 0.3  PROT 6.2*  ALBUMIN 3.2*   No results for input(s): LIPASE, AMYLASE in the last 168 hours. No results for input(s): AMMONIA in the last 168 hours. CBC: Recent Labs  Lab 07/18/21 0742 07/22/21 1313 07/23/21 0600 07/24/21 0550  WBC 9.1 14.4* 14.9* 12.3*  NEUTROABS  --   --  13.8*  --   HGB 15.1* 16.1* 14.6 13.5  HCT 46.6* 48.7* 45.1 41.3  MCV 95.3 94.2 96.0 95.4  PLT 289 332 280 250   Cardiac Enzymes: No results for input(s): CKTOTAL, CKMB, CKMBINDEX, TROPONINI in the last 168 hours. BNP: BNP (last 3 results) Recent Labs    07/21/21 1640  BNP 28.5    ProBNP (last 3 results) No results for input(s): PROBNP in  the last 8760 hours.  CBG: Recent Labs  Lab 07/24/21 1147  GLUCAP 148*       Signed:  Fayrene Helper MD.  [facility] 07/24/2021, 12:49 PM

## 2021-07-24 NOTE — Progress Notes (Signed)
Report given to Amy RN, who confirmed that pt will be going to bed 154 at [facility].

## 2021-07-24 NOTE — Care Management Important Message (Signed)
Important Message  Patient Details IM Letter placed in Patients room Name: Tasha [name] MRN: [id number] Date of Birth: 09-30-1950   Medicare Important Message Given:  Yes     [name] 07/24/2021, 9:34 AM

## 2021-07-24 NOTE — Progress Notes (Signed)
Patient's HCPOA Tasha [name] arrived to floor and advised that she had been in appointments earlier in the day and was visiting SNFs for Pt and when returned call Alinda Sierras had already gone for the day, she spoke to [place] who was going to have Barbaraann Rondo return her call as she had set up for Pt to go to [facility] on Monday. AC, Joe made aware of situation. [facility] phoned and cancelled, [facility] phoned by nurse that Pt would not be arriving this evening.

## 2021-07-24 NOTE — Assessment & Plan Note (Addendum)
-   As below

## 2021-07-24 NOTE — TOC Transition Note (Signed)
Transition of Care [facility]) - CM/SW Discharge Note   Patient Details  Name: Tasha [name] MRN: [id number] Date of Birth: [birth year]  [facility]) CM/SW Contact:  Rudransh Bellanca, Meriam Sprague, RN Phone Number: 07/24/2021, 12:26 PM   Clinical Narrative:    Pt provided with SNF bed offers as her friend Darl Pikes was called x3 with no return call. Pt alert and oriented x 4. Pt chose [facility]. [facility] liaison was contacted to accept bed. Pt to dc there today via [facility]. RN to call report to [phone].   Final next level of care: Home/Self Care Barriers to Discharge: Continued Medical Work up   Patient Goals and CMS Choice Patient states their goals for this hospitalization and ongoing recovery are:: To get home CMS Medicare.gov Compare Post Acute Care list provided to:: Patient Choice offered to / list presented to : NA  Discharge Plan and Services   Discharge Planning Services: CM Consult          Readmission Risk Interventions No flowsheet data found.

## 2021-07-24 NOTE — Progress Notes (Signed)
Occupational Therapy Treatment Patient Details Name: Tasha [name] MRN: [id number] DOB: 05/15/1951 Today's Date: 07/24/2021   History of present illness Patient is 71 year old female who presented with increased shortness of breath. patient was admitted with COPD exacerbation and acute respiratory failure with hypoxia on 07/14/21. PMH: COPD, asthma, depression, tobacco abuse, HTN, HLD, anxiety.   OT comments  Treatment focused on reiteration of breathing techniques and functional mobility in room. Patient found on 1 L with o2 sat at 97%. She can maintain in the 90s on room air but briefly drops to 87-88% with short ambulation - with a short episode of wheezing. She has a prevalence to perform rapid shallow breathing and has to be constantly cued to pursed-lipped breathe. She is able to state she has COPD today which is an improvement. When she got back to side of bed she placed on oxygen due to her feelings of dyspnea. Therapist educated to stay out of the bed and sit upright for a while in the chair - which it appears she has not done during hospital stay. Will continue to need 24/7 supervision and reiteration of all instruction.   Recommendations for follow up therapy are one component of a multi-disciplinary discharge planning process, led by the attending physician.  Recommendations may be updated based on patient status, additional functional criteria and insurance authorization.    Follow Up Recommendations  Skilled nursing-short term rehab (<3 hours/day)    Assistance Recommended at Discharge Frequent or constant Supervision/Assistance  Patient can return home with the following  Assistance with cooking/housework;Direct supervision/assist for financial management;Direct supervision/assist for medications management;A little help with walking and/or transfers;A little help with bathing/dressing/bathroom   Equipment Recommendations  None recommended by OT    Recommendations for Other  Services      Precautions / Restrictions Precautions Precaution Comments: monitor O2 Restrictions Weight Bearing Restrictions: No       Mobility Bed Mobility Overal bed mobility: Modified Independent                  Transfers Overall transfer level: Modified independent                       Balance Overall balance assessment: Mild deficits observed, not formally tested                                         ADL either performed or assessed with clinical judgement   ADL       Grooming: Supervision/safety;Oral care;Standing Grooming Details (indicate cue type and reason): brief grooming task at sink -  poor quality by patient                               General ADL Comments: Patient in bed on 1 liter when therapist entered the  room. Recliner in room bare with no evidence patient has spent any time in it. Patient able to maintain o2 sat above 90% on RA but dropped to briefly to 87-88% after ambulating on RA. Continued education and reinforcement of breathing techniques.    Extremity/Trunk Assessment              Vision       Perception     Praxis      Cognition Arousal/Alertness: Awake/alert Behavior During Therapy: WFL for tasks  assessed/performed Overall Cognitive Status: Within Functional Limits for tasks assessed                                          Exercises      Shoulder Instructions       General Comments      Pertinent Vitals/ Pain       Pain Assessment Pain Assessment: No/denies pain  Home Living                                          Prior Functioning/Environment              Frequency  Min 2X/week        Progress Toward Goals  OT Goals(current goals can now be found in the care plan section)  Progress towards OT goals: Progressing toward goals  Acute Rehab OT Goals Patient Stated Goal: return home independently OT Goal  Formulation: With patient Time For Goal Achievement: 08/03/21 Potential to Achieve Goals: Good  Plan Discharge plan remains appropriate    Co-evaluation                 AM-PAC OT "6 Clicks" Daily Activity     Outcome Measure   Help from another person eating meals?: None Help from another person taking care of personal grooming?: A Little Help from another person toileting, which includes using toliet, bedpan, or urinal?: A Little Help from another person bathing (including washing, rinsing, drying)?: A Little Help from another person to put on and taking off regular upper body clothing?: A Little Help from another person to put on and taking off regular lower body clothing?: A Little 6 Click Score: 19    End of Session Equipment Utilized During Treatment: Oxygen  OT Visit Diagnosis: Other symptoms and signs involving cognitive function;Unsteadiness on feet (R26.81)   Activity Tolerance Patient tolerated treatment well   Patient Left in chair;with call bell/phone within reach   Nurse Communication Mobility status (o2 sats)        Time: 4765-4650 OT Time Calculation (min): 14 min  Charges: OT General Charges $OT Visit: 1 Visit OT Treatments $Therapeutic Activity: 8-22 mins  Waldron Session, OTR/L Acute Care Rehab Services  Office [phone] Pager: [phone]   Kelli Churn 07/24/2021, 9:54 AM

## 2021-07-24 NOTE — Assessment & Plan Note (Addendum)
A1c 6%.

## 2021-07-25 DIAGNOSIS — J441 Chronic obstructive pulmonary disease with (acute) exacerbation: Secondary | ICD-10-CM | POA: Diagnosis not present

## 2021-07-25 LAB — HEMOGLOBIN A1C
Hgb A1c MFr Bld: 6 % — ABNORMAL HIGH (ref 4.8–5.6)
Mean Plasma Glucose: 126 mg/dL

## 2021-07-25 LAB — GLUCOSE, CAPILLARY
Glucose-Capillary: 154 mg/dL — ABNORMAL HIGH (ref 70–99)
Glucose-Capillary: 205 mg/dL — ABNORMAL HIGH (ref 70–99)
Glucose-Capillary: 120 mg/dL — ABNORMAL HIGH (ref 70–99)

## 2021-07-25 MED ORDER — PREDNISONE 20 MG PO TABS
40.0000 mg | ORAL_TABLET | Freq: Every day | ORAL | Status: DC
  Administered 2021-07-26 – 2021-07-27 (×2): 40 mg via ORAL
  Filled 2021-07-25 (×2): qty 2

## 2021-07-25 MED ORDER — PREDNISONE 5 MG PO TABS: 10.0000 mg | ORAL_TABLET | Freq: Every day | ORAL | Status: DC

## 2021-07-25 MED ORDER — PREDNISONE 5 MG PO TABS: 30.0000 mg | ORAL_TABLET | Freq: Every day | ORAL | Status: DC

## 2021-07-25 MED ORDER — PREDNISONE 20 MG PO TABS: 20.0000 mg | ORAL_TABLET | Freq: Every day | ORAL | Status: DC

## 2021-07-25 NOTE — TOC Transition Note (Signed)
Transition of Care [facility]) - CM/SW Discharge Note   Patient Details  Name: Caterra Abello MRN: [id number] Date of Birth: 10/04/1950  Transition of Care [facility]) CM/SW Contact:  Avynn Klassen, Marta Lamas, LCSW Phone Number: 07/25/2021, 2:00 PM   Clinical Narrative:     Extensive conversation with Debbe Bales, [place] and primary caregiver for patient, to clear up any miscommunication regarding skilled nursing facility placement arrangements.  Patient and Manuela Schwartz would prefer for patient to receive short-term skilled nursing facility placement at [facility] for rehabilitative services, as opposed to [facility].  LCSW left HIPAA compliant message on voicemail for admissions coordinator at [facility], confirming patient's choice.  If oxygen level increases and patient is medically stable and ready for discharge, [facility] reported to Manuela Schwartz that they will have a skilled bed available for patient on 02/27.  Home oxygen has been arranged for patient through [facility] with [employer].  Final next level of care: Home/Self Care Barriers to Discharge: Continued Medical Work up   Patient Goals and CMS Choice Patient states their goals for this hospitalization and ongoing recovery are:: To get home CMS Medicare.gov Compare Post Acute Care list provided to:: Patient Choice offered to / list presented to : NA  Discharge Placement  [place].  Discharge Plan and Services   Discharge Planning Services: CM Consult            DME Arranged: Oxygen DME Agency: [employer] Date DME Agency Contacted: 07/19/21 Time DME Agency Contacted: [id number] Representative spoke with at DME Agency: Elzie Rings - # [phone] [place] Arranged: NA    Social Determinants of Health ([place]) Interventions   N/A  Readmission Risk Interventions No flowsheet data found.

## 2021-07-25 NOTE — Progress Notes (Signed)
Patient does not want to wear CPAP) for tonight 

## 2021-07-25 NOTE — Progress Notes (Signed)
Seen with friend at bedside today, Tasha [name].  No new complaints or concerns.  Sounds like confusion yesterday with skilled choice, plan at this point is for [facility] Monday.  Today's Vitals   07/24/21 2000 07/24/21 2133 07/25/21 0523 07/25/21 0815  BP:  (!) 148/95 (!) 155/90   Pulse:  94 80   Resp:  (!) 8 17   Temp:  97.9 F (36.6 C) 98 F (36.7 C)   TempSrc:  Oral Oral   SpO2:  96% 96% 94%  Weight:      Height:      PainSc: 0-No pain      Body mass index is 20.56 kg/m.  NAD RRR CTAB on 4 L S/nt/nd No lEE  AP Awaiting SNF placement, continue to wean o2 as tolerated.  Will taper steroids. See discharge summary for additional details

## 2021-07-26 DIAGNOSIS — J441 Chronic obstructive pulmonary disease with (acute) exacerbation: Secondary | ICD-10-CM | POA: Diagnosis not present

## 2021-07-26 LAB — BASIC METABOLIC PANEL
Anion gap: 5 (ref 5–15)
BUN: 29 mg/dL — ABNORMAL HIGH (ref 8–23)
CO2: 33 mmol/L — ABNORMAL HIGH (ref 22–32)
Calcium: 9.1 mg/dL (ref 8.9–10.3)
Chloride: 100 mmol/L (ref 98–111)
Creatinine, Ser: 0.68 mg/dL (ref 0.44–1.00)
GFR, Estimated: >60 mL/min (ref >60)
Glucose, Bld: 114 mg/dL — ABNORMAL HIGH (ref 70–99)
Potassium: 3.7 mmol/L (ref 3.5–5.1)
Sodium: 138 mmol/L (ref 135–145)

## 2021-07-26 LAB — CBC
HCT: 43.4 % (ref 36.0–46.0)
Hemoglobin: 13.9 g/dL (ref 12.0–15.0)
MCH: 30.8 pg (ref 26.0–34.0)
MCHC: 32 g/dL (ref 30.0–36.0)
MCV: 96.2 fL (ref 80.0–100.0)
Platelets: 262 10*3/uL (ref 150–400)
RBC: 4.51 MIL/uL (ref 3.87–5.11)
RDW: 14 % (ref 11.5–15.5)
WBC: 12.2 10*3/uL — ABNORMAL HIGH (ref 4.0–10.5)
nRBC: 0 % (ref 0.0–0.2)

## 2021-07-26 LAB — GLUCOSE, CAPILLARY
Glucose-Capillary: 97 mg/dL (ref 70–99)
Glucose-Capillary: 178 mg/dL — ABNORMAL HIGH (ref 70–99)
Glucose-Capillary: 172 mg/dL — ABNORMAL HIGH (ref 70–99)
Glucose-Capillary: 167 mg/dL — ABNORMAL HIGH (ref 70–99)
Glucose-Capillary: 91 mg/dL (ref 70–99)

## 2021-07-26 NOTE — Progress Notes (Signed)
Notified RT regarding scheduled neb, s/w Amy states pt stated if she was asleep to not wake her. Pt is asleep, no distress noted at this time. Tasha [name]

## 2021-07-26 NOTE — Progress Notes (Signed)
Seen at bedside, no new concerns Awaiting discharge to SNF 2/27  Vitals:   07/26/21 0417 07/26/21 0800  BP: 120/81   Pulse: 84   Resp: 16   Temp: (!) 97.5 F (36.4 C)   SpO2: 93% 97%   NAD CTAB on 3-4 L RRR S/NT/ND No LEE  Tasha [name]/P Steroid taper, O2 wean.  See previous note and discharge summary.  She remains table for discharge.

## 2021-07-26 NOTE — Progress Notes (Signed)
At shift change patient was found without oxygen on. Educated patient on importance of keeping oxygen on. She was originally weaned down to 3L yesterday & was back up to 4L today. Reported by night time RN she was intermittently taking oxygen off. RT will try to wean back to 3L.

## 2021-07-27 DIAGNOSIS — J441 Chronic obstructive pulmonary disease with (acute) exacerbation: Secondary | ICD-10-CM | POA: Diagnosis not present

## 2021-07-27 LAB — GLUCOSE, CAPILLARY
Glucose-Capillary: 92 mg/dL (ref 70–99)
Glucose-Capillary: 99 mg/dL (ref 70–99)

## 2021-07-27 LAB — ALPHA-1-ANTITRYPSIN PHENOTYP: A-1 Antitrypsin, Ser: 128 mg/dL (ref 101–187)

## 2021-07-27 MED ORDER — IPRATROPIUM-ALBUTEROL 0.5-2.5 (3) MG/3ML IN SOLN: 3.0000 mL | Freq: Two times a day (BID) | RESPIRATORY_TRACT | Status: DC

## 2021-07-27 NOTE — Care Management Important Message (Signed)
Important Message  Patient Details IM Letter placed in Patients room. Name: Tasha [name] MRN: [id number] Date of Birth: 27-Feb-1951   Medicare Important Message Given:  Yes     Kerin Salen 07/27/2021, 10:44 AM

## 2021-07-27 NOTE — Discharge Summary (Signed)
Physician Discharge Summary  Tasha [name] [id number] DOB: 1950/07/11 DOA: 07/14/2021  PCP: Darreld Mclean, MD  Admit date: 07/14/2021 Discharge date: 07/27/2021  Time spent: 40 minutes  Recommendations for Outpatient Follow-up:  Follow outpatient CBC/CMP Follow A1c pending Follow alpha 1 anti trypsin  Continue to wean o2 as tolerated (at discharge requiring 2 L at rest and 4 L with activity) Follow adrenal nodule outpatient Follow with pulm outpatient   Discharge Diagnoses:  Principal Problem:   COPD exacerbation ([place]) Active Problems:   Acute on chronic respiratory failure with hypoxia and hypercapnia (HCC)   Anxiety and depression   Hypercholesteremia   Adrenal nodule ([place])   Prediabetes   Discharge Condition: stable  Diet recommendation: heart healthy  Filed Weights   07/14/21 1436 07/14/21 2302  Weight: 50.3 kg 51 kg    History of present illness:  Tasha [name] is Tasha [name] 71 y.o. female with PMH significant for COPD, chronic smoking, HTN, HLD, anxiety, depression, arthritis, GERD, DVT, scoliosis who lives at home alone.  She was brought in for worsening shortness of breath and cough.  She was found to have Tasha [name] COPD exacerbation and has slowly improved with steroids and nebs.  Pulm was consulted and plans for outpatient follow up.  She's gradually improved, now requiring 2 L at rest and 4 L with activity.  She'll discharge with continued plans for outpatient follow up.   See below for additional details  Hospital Course:  Assessment and Plan: * COPD exacerbation ([place])- (present on admission) As below  Acute on chronic respiratory failure with hypoxia and hypercapnia (HCC) Due to COPD exacerbation Hx COPD, emphysema, was not on nebs at home and no pulmonologist Smoking up until admission Currently requiring 2 L at rest (required 4 L with activity based on recent PT eval from 2/23) Continue to wean o2 as tolerated for sat goal 88-94% (required as much  as 8 L during this hospitalization) CT 2/20 with centrilobular emphysema with mild diffuse bronchial wall thickening and scattered mucous plugging c/w smoking related lung disease Will discharge with steroid taper Will continue diuresis as tolerated, doesn't appear overtly overloaded pulm consult, appreciate assistance pending alpha 1 anti trypsin study Needs to follow outpatient with pulm Lactate pending, nocturnal NIPPV Echo with EF [id number], grade 1 diastolic dysfunction, negative bubble study Negative LE [place]        Anxiety and depression- (present on admission) Buspar, lexapro, atarax  Hypercholesteremia- (present on admission) simvastatin  Adrenal nodule ([place]) Noted on CT scan on L Follow with PCP outpatient  Prediabetes A1c 6   Procedures: echoIMPRESSIONS     1. Left ventricular ejection fraction, by estimation, is 65 to 70%. The  left ventricle has normal function. The left ventricle has no regional  wall motion abnormalities. Left ventricular diastolic parameters are  consistent with Grade I diastolic  dysfunction (impaired relaxation).   2. Right ventricular systolic function is normal. The right ventricular  size is normal.   3. The mitral valve is normal in structure. No evidence of mitral valve  regurgitation. No evidence of mitral stenosis.   4. The aortic valve is tricuspid. There is mild calcification of the  aortic valve. Aortic valve regurgitation is not visualized. Aortic valve  sclerosis is present, with no evidence of aortic valve stenosis.   5. The inferior vena cava is normal in size with greater than 50%  respiratory variability, suggesting right atrial pressure of 3 mmHg.   6. Agitated saline contrast bubble study was  negative, with no evidence  of any interatrial shunt.   Comparison(s): No significant change from prior study. Prior images  reviewed side by side.   LE [place] Summary:  BILATERAL:  - No evidence of deep vein thrombosis seen in  the lower extremities,  bilaterally.  - No evidence of superficial venous thrombosis in the lower extremities,  bilaterally.  -No evidence of popliteal cyst, bilaterally.     Echo IMPRESSIONS     1. Left ventricular ejection fraction, by estimation, is 65 to 70%. The  left ventricle has normal function. The left ventricle has no regional  wall motion abnormalities. Left ventricular diastolic parameters are  consistent with Grade I diastolic  dysfunction (impaired relaxation).   2. Right ventricular systolic function is normal. The right ventricular  size is normal.   3. The mitral valve is normal in structure. Trivial mitral valve  regurgitation. No evidence of mitral stenosis.   4. The aortic valve is tricuspid. There is mild calcification of the  aortic valve. Aortic valve regurgitation is not visualized. Aortic valve  sclerosis/calcification is present, without any evidence of aortic  stenosis.   5. The inferior vena cava is normal in size with greater than 50%  respiratory variability, suggesting right atrial pressure of 3 mmHg.   6. 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 Cannot exclude Tasha [name] small PFO.   Consultations: pulm  Discharge Exam: Vitals:   07/27/21 0450 07/27/21 0734  BP: 130/67   Pulse: 82   Resp: 18   Temp: 98.2 F (36.8 C)   SpO2: 97% 95%   Breathing feels ok, no complaints  General: No acute distress. Cardiovascular: Heart sounds show Tasha [name] regular rate, and rhythm.  Lungs: 3 L -> weaned to 2 and tolerating, no increased WOB Abdomen: Soft, nontender, nondistended Neurological: Alert and oriented 3. Moves all extremities 4 . Cranial nerves II through XII grossly intact. Skin: Warm and dry. No rashes or lesions. Extremities: No clubbing or cyanosis. No edema.  Discharge Instructions   Discharge Instructions     Ambulatory referral to Pulmonology   Complete by: As directed    Reason for referral: Asthma/COPD   Call MD for:  difficulty breathing,  headache or visual disturbances   Complete by: As directed    Call MD for:  extreme fatigue   Complete by: As directed    Call MD for:  hives   Complete by: As directed    Call MD for:  persistant dizziness or light-headedness   Complete by: As directed    Call MD for:  persistant nausea and vomiting   Complete by: As directed    Call MD for:  redness, tenderness, or signs of infection (pain, swelling, redness, odor or green/yellow discharge around incision site)   Complete by: As directed    Call MD for:  severe uncontrolled pain   Complete by: As directed    Call MD for:  temperature >100.4   Complete by: As directed    Diet - low sodium heart healthy   Complete by: As directed    Diet - low sodium heart healthy   Complete by: As directed    Discharge instructions   Complete by: As directed    You were seen for Tasha [name] COPD exacerbation.  You've improved gradually with steroids and nebs.  You're being discharged with 2 Liters of oxygen to use at rest, you should use 4 L with activity.  You should continue to follow up with pulmonology outpatient to  wean your oxygen as tolerated.    We've started oral lasix for you which can be continued outpatient.  Follow repeat labs within about week to check on your kidney function.  We're stopping the HCTZ.  You'll need to follow with the lung doctors outpatient.  You have labs pending that you should follow up with the lung doctors.  We've started you on breo ellipta outpatient.  This is Tasha [name] controller medicine for your COPD and you should take this every day.  Start taking albuterol as needed.  We're tapering your steroids.  You have an adrenal nodule which should be followed up with your PCP outpatient.  Return for new, recurrent, or worsening symptoms.  Please ask your PCP to request records from this hospitalization so they know what was done and what the next steps will be.   Increase activity slowly   Complete by: As directed    Increase  activity slowly   Complete by: As directed       Allergies as of 07/27/2021       Reactions   Acitretin Swelling, Rash   Pollen Extract Shortness Of Breath   Leaves, pt. reports   Percodan [oxycodone-aspirin]    Pt does not remember   Statins Other (See Comments)   Muscle aches in legs        Medication List     STOP taking these medications    PRIMATENE MIST IN   risedronate 35 MG tablet Commonly known as: ACTONEL   triamcinolone cream 0.1 % Commonly known as: KENALOG       TAKE these medications    albuterol 108 (90 Base) MCG/ACT inhaler Commonly known as: VENTOLIN HFA Inhale 2 puffs into the lungs every 4 (four) hours as needed for wheezing or shortness of breath.   aspirin EC 81 MG tablet Take 81 mg by mouth daily. Swallow whole.   busPIRone 15 MG tablet Commonly known as: BUSPAR Take 1 tablet (15 mg total) by mouth 2 (two) times daily. What changed:  when to take this reasons to take this   escitalopram 10 MG tablet Commonly known as: LEXAPRO Take 1 tablet (10 mg total) by mouth daily.   fluticasone furoate-vilanterol 200-25 MCG/ACT Aepb Commonly known as: BREO ELLIPTA Inhale 1 puff into the lungs daily.   furosemide 20 MG tablet Commonly known as: LASIX Take 1 tablet (20 mg total) by mouth daily.   ipratropium-albuterol 0.5-2.5 (3) MG/3ML Soln Commonly known as: DUONEB INHALE 3MLS VIA NEBULIZER EVERY 6 HOURS AS NEEDED   magnesium 30 MG tablet Take 30 mg by mouth daily.   Multivitamin Adults 50+ Tabs Take 1 tablet by mouth 3 (three) times Cobi Aldape week.   predniSONE 20 MG tablet Commonly known as: DELTASONE Take 2 tablets (40 mg total) by mouth daily with breakfast for 3 days, THEN 1.5 tablets (30 mg total) daily with breakfast for 3 days, THEN 1 tablet (20 mg total) daily with breakfast for 3 days, THEN 0.5 tablets (10 mg total) daily with breakfast for 3 days. Start taking on: July 25, 2021   simvastatin 10 MG tablet Commonly known as:  ZOCOR Take 1 tablet (10 mg total) by mouth daily.       ASK your doctor about these medications    guaiFENesin 600 MG 12 hr tablet Commonly known as: MUCINEX Take 1 tablet (600 mg total) by mouth 2 (two) times daily for 7 days. Ask about: Should I take this medication?  Durable Medical Equipment  (From admission, onward)           Start     Ordered   07/19/21 0945  For home use only DME oxygen  Once       Question Answer Comment  Length of Need Lifetime   Oxygen delivery system Gas      07/19/21 0944           Allergies  Allergen Reactions   Acitretin Swelling and Rash   Pollen Extract Shortness Of Breath    Leaves, pt. reports   Percodan [Oxycodone-Aspirin]     Pt does not remember   Statins Other (See Comments)    Muscle aches in legs     Contact information for follow-up providers     Copland, Gay Filler, MD Follow up.   Specialty: Family Medicine Contact information: [address] [place] [address] [phone]         [facility] Follow up.   Specialty: Pulmonology Contact information: [address] [place] [phone]             Contact information for after-discharge care     Destination     [facility] Preferred SNF .   Service: Skilled Nursing Contact information: [place] [phone]                      The results of significant diagnostics from this hospitalization (including imaging, microbiology, ancillary and laboratory) are listed below for reference.    Significant Diagnostic Studies: CT CHEST W CONTRAST  Result Date: 07/20/2021 CLINICAL DATA:  Dyspnea, chronic, unclear etiology EXAM: CT CHEST WITH CONTRAST TECHNIQUE: Multidetector CT imaging of the chest was performed during intravenous contrast administration. RADIATION DOSE REDUCTION: This  exam was performed according to the departmental dose-optimization program which includes automated exposure control, adjustment of the mA and/or kV according to patient size and/or use of iterative reconstruction technique. CONTRAST:  45mL OMNIPAQUE IOHEXOL 300 MG/ML  SOLN COMPARISON:  CT 02/12/2019 FINDINGS: Cardiovascular: Normal cardiac size.No pericardial disease.Normal size main and branch pulmonary arteries.Mild atherosclerotic calcifications of the aortic arch and descending aorta. Mediastinum/Nodes: No lymphadenopathy.The thyroid is unremarkable.Esophagus is unremarkable. Lungs/Pleura: The trachea is unremarkable. There is mild diffuse bronchial wall thickening. Scattered mucous plugging.There is apical predominant centrilobular emphysema. There is scarring and subsegmental atelectasis along the medial aspects of the upper lobes bilaterally.No suspicious pulmonary nodules or masses.No pleural effusion. No pneumothorax. Upper Abdomen: No acute abnormality. Small left hepatic cysts. Small bilateral renal cysts. There is Willian Donson left adrenal nodule measuring up to 1.6 cm, similar in appearance to prior CT in September 2020, unchanged in size (series 2, image 137). Musculoskeletal: No acute osseous abnormality.No suspicious lytic or blastic lesions. Multilevel degenerative changes of the spine. IMPRESSION: Centrilobular emphysema with mild diffuse bronchial wall thickening and scattered mucous plugging consistent with smoking related lung disease. Medial upper lobe subsegmental atelectasis and scarring bilaterally. No suspicious pulmonary nodules or masses. Aortic Atherosclerosis (ICD10-I70.0) and Emphysema (ICD10-J43.9). Unchanged left adrenal nodule in comparison prior CT in September 2020. Electronically Signed   By: Maurine Simmering M.D.   On: 07/20/2021 11:31   DG Chest Port 1 View  Result Date: 07/14/2021 CLINICAL DATA:  COPD EXAM: PORTABLE CHEST 1 VIEW COMPARISON:  None. FINDINGS: Normal mediastinum and cardiac  silhouette. Normal pulmonary vasculature. No evidence of effusion, infiltrate, or pneumothorax. No acute bony  abnormality. IMPRESSION: No acute cardiopulmonary process. Electronically Signed   By: Suzy Bouchard M.D.   On: 07/14/2021 15:24   ECHOCARDIOGRAM COMPLETE  Result Date: 07/15/2021    ECHOCARDIOGRAM REPORT   Patient Name:   Tasha [name] [facility] Date of Exam: 07/15/2021 Medical Rec #:  [id number]            Height:       62.0 in Accession #:    [id number]           Weight:       112.4 lb Date of Birth:  06-08-1950            BSA:          1.497 m Patient Age:    56 years             BP:           154/79 mmHg Patient Gender: F                    HR:           100 bpm. Exam Location:  Inpatient Procedure: 2D Echo, Cardiac Doppler and Color Doppler Indications:    Dyspnea  History:        Patient has no prior history of Echocardiogram examinations.                 COPD; Risk Factors:Hypertension.  Sonographer:    Jyl Heinz Referring Phys: [id number] [place]  1. Left ventricular ejection fraction, by estimation, is 65 to 70%. The left ventricle has normal function. The left ventricle has no regional wall motion abnormalities. Left ventricular diastolic parameters are consistent with Grade I diastolic dysfunction (impaired relaxation).  2. Right ventricular systolic function is normal. The right ventricular size is normal.  3. The mitral valve is normal in structure. Trivial mitral valve regurgitation. No evidence of mitral stenosis.  4. The aortic valve is tricuspid. There is mild calcification of the aortic valve. Aortic valve regurgitation is not visualized. Aortic valve sclerosis/calcification is present, without any evidence of aortic stenosis.  5. The inferior vena cava is normal in size with greater than 50% respiratory variability, suggesting right atrial pressure of 3 mmHg.  6. 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 Cannot exclude Brennan Litzinger small PFO. FINDINGS  Left Ventricle: Left ventricular  ejection fraction, by estimation, is 65 to 70%. The left ventricle has normal function. The left ventricle has no regional wall motion abnormalities. The left ventricular internal cavity size was normal in size. There is  no left ventricular hypertrophy. Left ventricular diastolic parameters are consistent with Grade I diastolic dysfunction (impaired relaxation). Right Ventricle: The right ventricular size is normal. No increase in right ventricular wall thickness. Right ventricular systolic function is normal. Left Atrium: Left atrial size was normal in size. Right Atrium: Right atrial size was normal in size. Pericardium: There is no evidence of pericardial effusion. Mitral Valve: The mitral valve is normal in structure. Trivial mitral valve regurgitation. No evidence of mitral valve stenosis. Tricuspid Valve: The tricuspid valve is normal in structure. Tricuspid valve regurgitation is trivial. No evidence of tricuspid stenosis. Aortic Valve: The aortic valve is tricuspid. There is mild calcification of the aortic valve. Aortic valve regurgitation is not visualized. Aortic valve sclerosis/calcification is present, without any evidence of aortic stenosis. Aortic valve peak gradient measures 6.8 mmHg. Pulmonic Valve: The pulmonic valve was grossly normal. Pulmonic valve regurgitation is mild. No evidence of pulmonic stenosis. Aorta:  The aortic root is normal in size and structure. Venous: The inferior vena cava is normal in size with greater than 50% respiratory variability, suggesting right atrial pressure of 3 mmHg. IAS/Shunts: The interatrial septum is aneurysmal. Cannot exclude Tasha [name] small PFO.  LEFT VENTRICLE PLAX 2D LVIDd:         4.40 cm     Diastology LVIDs:         3.00 cm     LV e' medial:    8.16 cm/s LV PW:         1.00 cm     LV E/e' medial:  7.8 LV IVS:        1.10 cm     LV e' lateral:   10.40 cm/s LVOT diam:     1.80 cm     LV E/e' lateral: 6.1 LV SV:         56 LV SV Index:   37 LVOT Area:     2.54 cm   LV Volumes (MOD) LV vol d, MOD A2C: 81.1 ml LV vol d, MOD A4C: 80.4 ml LV vol s, MOD A2C: 30.2 ml LV vol s, MOD A4C: 30.4 ml LV SV MOD A2C:     50.9 ml LV SV MOD A4C:     80.4 ml LV SV MOD BP:      49.5 ml RIGHT VENTRICLE             IVC RV Basal diam:  3.10 cm     IVC diam: 1.80 cm RV Mid diam:    3.00 cm RV S prime:     17.60 cm/s TAPSE (M-mode): 2.0 cm LEFT ATRIUM             Index        RIGHT ATRIUM           Index LA diam:        3.30 cm 2.20 cm/m   RA Area:     10.60 cm LA Vol (A2C):   23.9 ml 15.97 ml/m  RA Volume:   21.70 ml  14.50 ml/m LA Vol (A4C):   28.4 ml 18.98 ml/m LA Biplane Vol: 27.1 ml 18.11 ml/m  AORTIC VALVE AV Area (Vmax): 2.27 cm AV Vmax:        130.00 cm/s AV Peak Grad:   6.8 mmHg LVOT Vmax:      116.00 cm/s LVOT Vmean:     81.400 cm/s LVOT VTI:       0.219 m  AORTA Ao Root diam: 2.90 cm Ao Asc diam:  2.80 cm MITRAL VALVE               TRICUSPID VALVE MV Area (PHT): 4.57 cm    TR Peak grad:   23.4 mmHg MV Decel Time: 166 msec    TR Vmax:        242.00 cm/s MV E velocity: 63.40 cm/s MV Eron Goble velocity: 86.10 cm/s  SHUNTS MV E/Joanna Borawski ratio:  0.74        Systemic VTI:  0.22 m                            Systemic Diam: 1.80 cm Glori Bickers MD Electronically signed by Glori Bickers MD Signature Date/Time: 07/15/2021/6:28:58 PM    Final    ECHOCARDIOGRAM COMPLETE BUBBLE STUDY  Result Date: 07/22/2021    ECHOCARDIOGRAM REPORT   Patient Name:   Tasha [name] [facility] Date of Exam:  07/22/2021 Medical Rec #:  [id number]            Height:       62.0 in Accession #:    [id number]           Weight:       112.4 lb Date of Birth:  Nov 15, 1950            BSA:          1.497 m Patient Age:    9 years             BP:           00/00 mmHg Patient Gender: F                    HR:           93 bpm. Exam Location:  Inpatient Procedure: 2D Echo Indications:    PFO  History:        Patient has prior history of Echocardiogram examinations, most                 recent 07/15/2021. COPD.  Sonographer:    Jefferey Pica Referring Phys: [phone] MURALI RAMASWAMY  Sonographer Comments: Image acquisition challenging due to respiratory motion. IMPRESSIONS  1. Left ventricular ejection fraction, by estimation, is 65 to 70%. The left ventricle has normal function. The left ventricle has no regional wall motion abnormalities. Left ventricular diastolic parameters are consistent with Grade I diastolic dysfunction (impaired relaxation).  2. Right ventricular systolic function is normal. The right ventricular size is normal.  3. The mitral valve is normal in structure. No evidence of mitral valve regurgitation. No evidence of mitral stenosis.  4. The aortic valve is tricuspid. There is mild calcification of the aortic valve. Aortic valve regurgitation is not visualized. Aortic valve sclerosis is present, with no evidence of aortic valve stenosis.  5. The inferior vena cava is normal in size with greater than 50% respiratory variability, suggesting right atrial pressure of 3 mmHg.  6. Agitated saline contrast bubble study was negative, with no evidence of any interatrial shunt. Comparison(s): No significant change from prior study. Prior images reviewed side by side. FINDINGS  Left Ventricle: Left ventricular ejection fraction, by estimation, is 65 to 70%. The left ventricle has normal function. The left ventricle has no regional wall motion abnormalities. The left ventricular internal cavity size was normal in size. There is  no left ventricular hypertrophy. Left ventricular diastolic parameters are consistent with Grade I diastolic dysfunction (impaired relaxation). Right Ventricle: The right ventricular size is normal. No increase in right ventricular wall thickness. Right ventricular systolic function is normal. Left Atrium: Left atrial size was normal in size. Right Atrium: Right atrial size was normal in size. Pericardium: There is no evidence of pericardial effusion. Mitral Valve: The mitral valve is normal in structure. No  evidence of mitral valve regurgitation. No evidence of mitral valve stenosis. Tricuspid Valve: The tricuspid valve is normal in structure. Tricuspid valve regurgitation is not demonstrated. No evidence of tricuspid stenosis. Aortic Valve: The aortic valve is tricuspid. There is mild calcification of the aortic valve. Aortic valve regurgitation is not visualized. Aortic valve sclerosis is present, with no evidence of aortic valve stenosis. Aortic valve peak gradient measures 4.0 mmHg. Pulmonic Valve: The pulmonic valve was normal in structure. Pulmonic valve regurgitation is trivial. No evidence of pulmonic stenosis. Aorta: The aortic root is normal in size and structure. Venous: The inferior vena cava is  normal in size with greater than 50% respiratory variability, suggesting right atrial pressure of 3 mmHg. IAS/Shunts: No atrial level shunt detected by color flow Doppler. Agitated saline contrast was given intravenously to evaluate for intracardiac shunting. Agitated saline contrast bubble study was negative, with no evidence of any interatrial shunt. There  is no evidence of Tasha [name] patent foramen ovale.  LEFT VENTRICLE PLAX 2D LVIDd:         3.60 cm   Diastology LVIDs:         1.80 cm   LV e' medial:    6.16 cm/s LV PW:         1.10 cm   LV E/e' medial:  6.8 LV IVS:        1.00 cm   LV e' lateral:   8.92 cm/s LVOT diam:     1.90 cm   LV E/e' lateral: 4.7 LV SV:         46 LV SV Index:   31 LVOT Area:     2.84 cm  RIGHT VENTRICLE             IVC RV Basal diam:  2.40 cm     IVC diam: 0.80 cm RV S prime:     13.90 cm/s TAPSE (M-mode): 1.9 cm LEFT ATRIUM             Index        RIGHT ATRIUM          Index LA diam:        2.60 cm 1.74 cm/m   RA Area:     8.82 cm LA Vol (A2C):   30.0 ml 20.05 ml/m  RA Volume:   15.50 ml 10.36 ml/m LA Vol (A4C):   23.5 ml 15.70 ml/m LA Biplane Vol: 27.0 ml 18.04 ml/m  AORTIC VALVE                 PULMONIC VALVE AV Area (Vmax): 3.04 cm     PV Vmax:       0.97 m/s AV Vmax:        99.70  cm/s   PV Peak grad:  3.8 mmHg AV Peak Grad:   4.0 mmHg LVOT Vmax:      107.00 cm/s LVOT Vmean:     65.500 cm/s LVOT VTI:       0.164 m  AORTA Ao Root diam: 2.90 cm Ao Asc diam:  2.90 cm MITRAL VALVE MV Area (PHT): 3.31 cm    SHUNTS MV Decel Time: 229 msec    Systemic VTI:  0.16 m MV E velocity: 41.60 cm/s  Systemic Diam: 1.90 cm MV Iyauna Sing velocity: 90.40 cm/s MV E/Eoin Willden ratio:  0.46 Candee Furbish MD Electronically signed by Candee Furbish MD Signature Date/Time: 07/22/2021/1:16:58 PM    Final    VAS [place] LOWER EXTREMITY VENOUS (DVT)  Result Date: 07/22/2021  Lower Venous DVT Study Patient Name:  Tasha [name] [facility]  Date of Exam:   07/22/2021 Medical Rec #: [id number]             Accession #:    [id number] Date of Birth: 09-12-1950             Patient Gender: F Patient Age:   20 years Exam Location:  [facility] Procedure:      VAS [place] LOWER EXTREMITY VENOUS (DVT) Referring Phys: MURALI RAMASWAMY --------------------------------------------------------------------------------  Indications: Hypoxia.  Risk Factors: HX of RLE DVT (2013). Comparison Study: Previous exam on 5.9.2014 was  negative for DVT Performing Technologist: Jody Hill RVT, RDMS  Examination Guidelines: Lekendrick Alpern complete evaluation includes B-mode imaging, spectral Doppler, color Doppler, and power Doppler as needed of all accessible portions of each vessel. Bilateral testing is considered an integral part of Trevante Tennell complete examination. Limited examinations for reoccurring indications may be performed as noted. The reflux portion of the exam is performed with the patient in reverse Trendelenburg.  +---------+---------------+---------+-----------+----------+--------------+  RIGHT     Compressibility Phasicity Spontaneity Properties Thrombus Aging  +---------+---------------+---------+-----------+----------+--------------+  CFV       Full            Yes       Yes                                     +---------+---------------+---------+-----------+----------+--------------+  SFJ       Full                                                             +---------+---------------+---------+-----------+----------+--------------+  FV Prox   Full            Yes       Yes                                    +---------+---------------+---------+-----------+----------+--------------+  FV Mid    Full            Yes       Yes                                    +---------+---------------+---------+-----------+----------+--------------+  FV Distal Full            Yes       Yes                                    +---------+---------------+---------+-----------+----------+--------------+  PFV       Full                                                             +---------+---------------+---------+-----------+----------+--------------+  POP       Full            Yes       Yes                                    +---------+---------------+---------+-----------+----------+--------------+  PTV       Full                                                             +---------+---------------+---------+-----------+----------+--------------+  PERO      Full                                                             +---------+---------------+---------+-----------+----------+--------------+   +---------+---------------+---------+-----------+----------+--------------+  LEFT      Compressibility Phasicity Spontaneity Properties Thrombus Aging  +---------+---------------+---------+-----------+----------+--------------+  CFV       Full            Yes       Yes                                    +---------+---------------+---------+-----------+----------+--------------+  SFJ       Full                                                             +---------+---------------+---------+-----------+----------+--------------+  FV Prox   Full            Yes       Yes                                     +---------+---------------+---------+-----------+----------+--------------+  FV Mid    Full            Yes       Yes                                    +---------+---------------+---------+-----------+----------+--------------+  FV Distal Full            Yes       Yes                                    +---------+---------------+---------+-----------+----------+--------------+  PFV       Full                                                             +---------+---------------+---------+-----------+----------+--------------+  POP       Full            Yes       Yes                                    +---------+---------------+---------+-----------+----------+--------------+  PTV       Full                                                             +---------+---------------+---------+-----------+----------+--------------+  PERO      Full                                                             +---------+---------------+---------+-----------+----------+--------------+ Venous dilation in area of popliteal/ssv junction.    Summary: BILATERAL: - No evidence of deep vein thrombosis seen in the lower extremities, bilaterally. - No evidence of superficial venous thrombosis in the lower extremities, bilaterally. -No evidence of popliteal cyst, bilaterally.   *See table(s) above for measurements and observations. Electronically signed by Harold Barban MD on 07/22/2021 at 10:31:27 PM.    Final     Microbiology: No results found for this or any previous visit (from the past 240 hour(s)).   Labs: Basic Metabolic Panel: Recent Labs  Lab 07/21/21 0538 07/22/21 1313 07/23/21 0600 07/24/21 0550 07/26/21 0614  NA  --  136 135 137 138  K  --  4.2 4.7 3.9 3.7  CL  --  95* 95* 98 100  CO2  --  34* 34* 34* 33*  GLUCOSE  --  158* 166* 173* 114*  BUN  --  37* 40* 36* 29*  CREATININE 0.76 0.82 0.85 0.83 0.68  CALCIUM  --  9.9 9.4 9.2 9.1  MG  --   --  2.4  --   --   PHOS  --   --  3.2  --   --    Liver  Function Tests: Recent Labs  Lab 07/23/21 0600  AST 16  ALT 29  ALKPHOS 85  BILITOT 0.3  PROT 6.2*  ALBUMIN 3.2*   No results for input(s): LIPASE, AMYLASE in the last 168 hours. No results for input(s): AMMONIA in the last 168 hours. CBC: Recent Labs  Lab 07/22/21 1313 07/23/21 0600 07/24/21 0550 07/26/21 0614  WBC 14.4* 14.9* 12.3* 12.2*  NEUTROABS  --  13.8*  --   --   HGB 16.1* 14.6 13.5 13.9  HCT 48.7* 45.1 41.3 43.4  MCV 94.2 96.0 95.4 96.2  PLT 332 280 250 262   Cardiac Enzymes: No results for input(s): CKTOTAL, CKMB, CKMBINDEX, TROPONINI in the last 168 hours. BNP: BNP (last 3 results) Recent Labs    07/21/21 1640  BNP 28.5    ProBNP (last 3 results) No results for input(s): PROBNP in the last 8760 hours.  CBG: Recent Labs  Lab 07/26/21 0747 07/26/21 1146 07/26/21 1632 07/26/21 2014 07/27/21 0724  GLUCAP 178* 172* 167* 91 92       Signed:  Fayrene Helper MD.  [facility] 07/27/2021, 10:34 AM

## 2021-07-27 NOTE — TOC Transition Note (Signed)
Transition of Care [facility]) - CM/SW Discharge Note  Patient Details  Name: Dashley Monts MRN: [id number] Date of Birth: 11/06/1950  [facility]) CM/SW Contact:  Ewing Schlein, LCSW Phone Number: 07/27/2021, 12:22 PM  Clinical Narrative: Patient is medically stable for transfer to SNF and PASRR was received 07/22/21 ([id number]). Patient will go to room 207P and the number for report is [phone]. Discharge summary, discharge orders, and SNF transfer report faxed to facility in hub. Per Lawerance Cruel is admissions at [facility], a new COVID test is not needed.  Medical necessity form done; PTAR scheduled. Discharge packet completed. CSW updated RN. TOC signing off.  Final next level of care: Skilled Nursing Facility Barriers to Discharge: Barriers Resolved  Patient Goals and CMS Choice Patient states their goals for this hospitalization and ongoing recovery are:: To get home CMS Medicare.gov Compare Post Acute Care list provided to:: Patient Choice offered to / list presented to : Patient  Discharge Placement PASRR number recieved: 07/22/21        Patient chooses bed at: [facility] Patient to be transferred to facility by: PTAR Patient and family notified of of transfer: 07/27/21  Discharge Plan and Services Discharge Planning Services: CM Consult     DME Arranged: N/A DME Agency: NA Date DME Agency Contacted: 07/19/21 Time DME Agency Contacted: 1053 Representative spoke with at DME Agency: Mittie Bodo - # [phone] HH Arranged: NA  Readmission Risk Interventions No flowsheet data found.

## 2021-07-30 ENCOUNTER — Other Ambulatory Visit: Payer: Self-pay | Admitting: *Deleted

## 2021-07-30 NOTE — Patient Outreach (Addendum)
Per [place] eligible member resides in [facility]. Screened for potential  [facility] care coordination services as a benefit of [employer] plan. ? ?Member's PCP at [facility] [place] has [place] care coordination services available if needed post SNF. CCM services will need to be ordered by PCP if appropriate. ? ?Information received from [place], [place] SW indicating transition plan is to return home.  ? ?Will continue to follow while member resides in SNF and plan follow up with Ms. Pecina. ? ? ? ?Marthenia Rolling, MSN, RN,BSN ?[place] Coordinator ?[phone] [facility]) ?[phone]  (Toll free office)   ?

## 2021-08-06 ENCOUNTER — Other Ambulatory Visit: Payer: Self-pay | Admitting: *Deleted

## 2021-08-06 NOTE — Patient Outreach (Signed)
THN Post- Acute Care Coordinator follow up. Per [place] eligible member resides in  [facility].  Screened for potential [facility] care coordination services as a benefit of [employer] plan. ? ?Facility site visit to [facility] skilled nursing facility. Met with Kirstin, SNF SW concerning member's progress, transition plan, and potential THN needs. Ms. Mckissic lives alone. Has very supportive friend/HCPOA Manuela Schwartz. Transition plan is pending progress with therapy. ? ?Spoke with Ms. Baehr and friend DPR/HCPOA Manuela Schwartz in room at [facility]. Manuela Schwartz report Ms. Hazelbaker has follow up with [facility] pulmonology next week. Ms. Rupert remains on oxygen. Manuela Schwartz reports transition plans are pending member's condition and how well she does with therapy. Manuela Schwartz endorses Ms. Dolley was fiercely independent prior to most recent hospitalization. However, ability to care for herself has since changed. Manuela Schwartz has several brochures for caregiver agencies that she is going to call. Manuela Schwartz also reports Ms. Manera recently engaged with [facility]- Dr. Manuella Ghazi for PCP services. ? ?Provided [facility] Care Management brochure, 24-hr nurse advice line magnet, and writer's contact information.  ? ?Edwyna Perfect is primary contact [phone]. ? ?Will continue to follow while member resides in SNF.   ? ? ?Marthenia Rolling, MSN, RN,BSN ?[place] Coordinator ?[phone] [facility]) ?[phone]  (Toll free office)  ? ? ? ? ?  ?

## 2021-08-10 DIAGNOSIS — F411 Generalized anxiety disorder: Secondary | ICD-10-CM | POA: Diagnosis not present

## 2021-08-10 DIAGNOSIS — F330 Major depressive disorder, recurrent, mild: Secondary | ICD-10-CM | POA: Diagnosis not present

## 2021-08-10 DIAGNOSIS — F1011 Alcohol abuse, in remission: Secondary | ICD-10-CM | POA: Diagnosis not present

## 2021-08-10 DIAGNOSIS — F4322 Adjustment disorder with anxiety: Secondary | ICD-10-CM | POA: Diagnosis not present

## 2021-08-13 ENCOUNTER — Encounter: Payer: Self-pay | Admitting: Pulmonary Disease

## 2021-08-13 ENCOUNTER — Ambulatory Visit (INDEPENDENT_AMBULATORY_CARE_PROVIDER_SITE_OTHER): Payer: Medicare Other | Admitting: Pulmonary Disease

## 2021-08-13 ENCOUNTER — Other Ambulatory Visit: Payer: Self-pay | Admitting: *Deleted

## 2021-08-13 ENCOUNTER — Other Ambulatory Visit: Payer: Self-pay

## 2021-08-13 VITALS — BP 132/90 | HR 75 | Ht 61.0 in | Wt 125.6 lb

## 2021-08-13 DIAGNOSIS — J449 Chronic obstructive pulmonary disease, unspecified: Secondary | ICD-10-CM

## 2021-08-13 DIAGNOSIS — J9612 Chronic respiratory failure with hypercapnia: Secondary | ICD-10-CM

## 2021-08-13 DIAGNOSIS — J9611 Chronic respiratory failure with hypoxia: Secondary | ICD-10-CM

## 2021-08-13 MED ORDER — ALBUTEROL SULFATE 1.25 MG/3ML IN NEBU: 1.0000 | INHALATION_SOLUTION | Freq: Four times a day (QID) | RESPIRATORY_TRACT | 12 refills | Status: DC | PRN

## 2021-08-13 MED ORDER — TRELEGY ELLIPTA 200-62.5-25 MCG/ACT IN AEPB: 1.0000 | INHALATION_SPRAY | Freq: Every day | RESPIRATORY_TRACT | 6 refills | Status: AC

## 2021-08-13 MED ORDER — ALBUTEROL SULFATE HFA 108 (90 BASE) MCG/ACT IN AERS: 2.0000 | INHALATION_SPRAY | Freq: Four times a day (QID) | RESPIRATORY_TRACT | 11 refills | Status: DC | PRN

## 2021-08-13 NOTE — Progress Notes (Signed)
? ? ?Subjective:  ? ?PATIENT ID: Tasha [name] GENDER: female DOB: Aug 25, 1950, MRN: [id number] ? ? ?HPI ? ?Chief Complaint  ?Patient presents with  ? Consult  ?  copd  ? ? ?Reason for Visit: New consult for COPD ? ?Ms. Tasha [name] is a 71 year old female active smoker with COPD, acute on chronic hypoxemic and hypercapnic respiratory failure, prior DVT hypertension, psoriasiform dermatitis and anxiety/depression who presents as a new consult for COPD. ? ?She was recently hospitalized from 2/14 to 07/24/2021 for COPD exacerbation. She was treated with steroids and nebulizers with improvement and discharged with outpatient pulmonary follow-up.  On discharge she required 2 L O2 at rest and 4 L with activity. Of note she was previously seen by Dr. Vaughan Browner at [facility] in 2019 however was lost to follow-up.  Note was reviewed and she was previously on Trelegy.  In 2019 she had multiple COPD exacerbations. ? ?She is on Primatene 2-3 times a day. She continues to have shortness of breath and wheezing. She is compliant with her oxygen. Denies nocturnal symptoms. Currently residing at [facility]. Quit smoking after her hospitalization. Able to ambulate with walker. Does not require assistance with ADLs. ? ?Social History: ?1 PPD x50 years ?Quit smoking in 07/2021 ? ?I have personally reviewed patient's past medical/family/social history, allergies, current medications. ? ?Past Medical History:  ?Diagnosis Date  ? Abnormal nuclear stress test 06/10/2015  ? Allergy   ? Anxiety   ? Arthritis   ? DDD lumbar; B thumbs  ? Asthma   ? age 21  ? Clotting disorder ([place])   ? Depression   ? DVT (deep venous thrombosis) ([place])   ? GERD (gastroesophageal reflux disease)   ? Hyperlipidemia   ? Hypertension   ? Psoriasiform dermatitis   ?  ? ?Family History  ?Problem Relation Age of Onset  ? Cancer Mother 47  ?     colorectal cancer  ? Stroke Mother 83  ?     CVA  ? Alcohol abuse Mother   ? Diabetes Mother   ? Hypertension Mother   ?  Heart disease Father   ? Cancer Maternal Grandfather   ?     lung  ? Alcohol abuse Brother   ? Heart attack Brother   ? Cancer Maternal Aunt   ?     breast  ? Breast cancer Maternal Aunt 43  ?  ? ?Social History  ? ?Occupational History  ? Not on file  ?Tobacco Use  ? Smoking status: Every Day  ?  Packs/day: 0.50  ?  Years: 46.00  ?  Pack years: 23.00  ?  Types: Cigarettes  ? Smokeless tobacco: Never  ? Tobacco comments:  ?  08/13/21 - stopped smoking 07/27/21  ?Vaping Use  ? Vaping Use: Never used  ?Substance and Sexual Activity  ? Alcohol use: No  ? Drug use: No  ? Sexual activity: Not on file  ? ? ?Allergies  ?Allergen Reactions  ? Acitretin Swelling and Rash  ? Pollen Extract Shortness Of Breath  ?  Leaves, pt. reports  ? Percodan [Oxycodone-Aspirin]   ?  Pt does not remember  ? Statins Other (See Comments)  ?  Muscle aches in legs ?  ?  ? ?Outpatient Medications Prior to Visit  ?Medication Sig Dispense Refill  ? albuterol (VENTOLIN HFA) 108 (90 Base) MCG/ACT inhaler Inhale 2 puffs into the lungs every 4 (four) hours as needed for wheezing or shortness of breath. 8 g 0  ?  aspirin EC 81 MG tablet Take 81 mg by mouth daily. Swallow whole.    ? busPIRone (BUSPAR) 15 MG tablet Take 1 tablet (15 mg total) by mouth 2 (two) times daily. (Patient taking differently: Take 15 mg by mouth 2 (two) times daily as needed.) 180 tablet 3  ? escitalopram (LEXAPRO) 10 MG tablet Take 1 tablet (10 mg total) by mouth daily. 30 tablet 11  ? furosemide (LASIX) 20 MG tablet Take 1 tablet (20 mg total) by mouth daily. 30 tablet 0  ? hydrOXYzine (ATARAX) 10 MG tablet Hydroxyzine    ? ipratropium-albuterol (DUONEB) 0.5-2.5 (3) MG/3ML SOLN INHALE 3MLS VIA NEBULIZER EVERY 6 HOURS AS NEEDED 90 mL 2  ? magnesium 30 MG tablet Take 30 mg by mouth daily.    ? Multiple Vitamins-Minerals (MULTIVITAMIN ADULTS 50+) TABS Take 1 tablet by mouth 3 (three) times a week.    ? simvastatin (ZOCOR) 10 MG tablet Take 1 tablet (10 mg total) by mouth daily. 30  tablet 12  ? fluticasone furoate-vilanterol (BREO ELLIPTA) 200-25 MCG/ACT AEPB Inhale 1 puff into the lungs daily. 30 each 0  ? Tiotropium Bromide Monohydrate (SPIRIVA RESPIMAT) 2.5 MCG/ACT AERS Spiriva    ? ?No facility-administered medications prior to visit.  ? ? ?Review of Systems  ?Constitutional:  Negative for chills, diaphoresis, fever, malaise/fatigue and weight loss.  ?HENT:  Negative for congestion.   ?Respiratory:  Positive for cough, shortness of breath and wheezing. Negative for hemoptysis and sputum production.   ?Cardiovascular:  Negative for chest pain, palpitations and leg swelling.  ? ? ?Objective:  ? ?Vitals:  ? 08/13/21 1027  ?BP: 132/90  ?Pulse: 75  ?SpO2: 97%  ?Weight: 125 lb 9.6 oz (57 kg)  ?Height: 5\' 1"  (1.549 m)  ? ?SpO2: 97 % ?O2 Device: Nasal cannula ?O2 Flow Rate (L/min): 2 L/min ?O2 Type: Continuous O2 ? ?Physical Exam: ?General: Frail and chronically ill-appearing, no acute distress. Pursed lip breathing ?HENT: [place], AT ?Eyes: EOMI, no scleral icterus ?Respiratory: Diminished breath sounds bilaterally.  No crackles, wheezing or rales ?Cardiovascular: RRR, -M/R/G, no JVD ?Extremities:-Edema,-tenderness ?Neuro: AAO x4, CNII-XII grossly intact ?Psych: Normal mood, normal affect ? ?Data Reviewed: ? ?Imaging: ?CT chest 07/20/2021-apical centrilobular emphysema, mild diffuse bronchial thickening and scattered mucus plugging.  Subsegmental atelectasis and scarring in left upper lobe ? ?PFT: ?04/07/2018 ?FVC 1.98 (77%) FEV1 1.42 (73%) ratio 55 TLC 115% RV 158% DLCO 72% ?Interpretation mild obstructive defect with significant bronchodilator response in FEV1 and FVC.  Air-trapping and reduced gas exchange also associated which is consistent with diagnosis of emphysema. ? ?Cardiac: ?Echo 07/22/2021-EF 65 to 70%.  No WMA.  Grade 1 DD.  RV size and function normal.  No valvular abnormalities. ? ?Labs: ?CBC ?   ?Component Value Date/Time  ? WBC 12.2 (H) 07/26/2021 [id number]  ? RBC 4.51 07/26/2021 0614  ? HGB  13.9 07/26/2021 0614  ? HCT 43.4 07/26/2021 0614  ? PLT 262 07/26/2021 0614  ? MCV 96.2 07/26/2021 0614  ? MCV 93.7 07/14/2014 1520  ? MCH 30.8 07/26/2021 0614  ? MCHC 32.0 07/26/2021 0614  ? RDW 14.0 07/26/2021 0614  ? LYMPHSABS 0.4 (L) 07/23/2021 0600  ? MONOABS 0.7 07/23/2021 0600  ? EOSABS 0.0 07/23/2021 0600  ? BASOSABS 0.0 07/23/2021 0600  ? ?Absolute eos 07/23/2021-0 ? ?ABG ?   ?Component Value Date/Time  ? PHART 7.48 (H) 07/21/2021 1648  ? PCO2ART 51 (H) 07/21/2021 1648  ? PO2ART 73 (L) 07/21/2021 1648  ? HCO3 38.0 (H)  07/21/2021 1648  ? ACIDBASEDEF 0.8 12/10/2017 2322  ? O2SAT 96.7 07/21/2021 1648  ? ?   ?Assessment & Plan:  ? ?Discussion: ?71 year old female active smoker with COPD, acute on chronic hypoxemic and hypercapnic respiratory failure, prior DVT hypertension, psoriasiform dermatitis and anxiety/depression who presents as a new consult for COPD.  Recent hospital admission for COPD exacerbation in 07/2021. Uncontrolled COPD, not in exacerbation. Counseled against use of epinephrine HFA due to side effects including tachycardia and hypertension. Discussed clinical course and management of COPD including bronchodilator regimen and action plan for exacerbation. ? ?COPD with emphysema ?Acute on chronic hypoxemic and hypercapneic respiratory failure ?--CONTINUE 2L at rest and 4L activity/sleep. Goal SpO2 >88% ?--START Trelegy ONE puff ONCE a day ?--CONTINUE Albuterol AS NEEDED for shortness of breath ?--Albuterol nebulizer meds ordered. Please let us know if you need a new machine ?--Plan for ambulatory O2 at next visit ? ?Health Maintenance ?Immunization History  ?Administered Date(s) Administered  ? Fluad Quad(high Dose 65+) 03/10/2019  ? Influenza Split 04/10/2012  ? Influenza Whole 04/17/2008, 04/29/2009  ? Influenza, High Dose Seasonal PF 04/08/2016, 03/31/2017, 02/01/2018  ? Influenza,inj,Quad PF,6+ Mos 05/07/2014, 04/14/2015  ? PFIZER(Purple Top)SARS-COV-2 Vaccination 07/26/2019, 08/21/2019,  03/31/2020  ? Pneumococcal Conjugate-13 02/01/2018  ? Pneumococcal Polysaccharide-23 04/17/2008, 04/29/2009  ? Td 06/04/2002, 10/23/2014  ? Zoster, Live 05/31/2014  ? ?CT Lung Screen- In the future will arrang

## 2021-08-13 NOTE — Patient Outreach (Signed)
THN Post- Acute Care Coordinator follow up. Per [place] eligible member currently resides in [facility].  Screened for potential [facility] Care Management services as a benefit of member's insurance plan. ? ?Facility site visit to [place] skilled nursing facility. Met with Kirstin, SNF SW  concerning member's transition plan and potential THN needs. Anticipated transition plan is to return home on Sunday, March 19th with [place]. Will also have outpatient palliative care services with [facility] thru [place]. ? ?Spoke with Ms. Whetsel and Manuela Schwartz [facility])  in room at [facility] to discuss transition plans and [place] Management services. Ms. Fulbright was just getting in from Pulmonology appointment. Manuela Schwartz confirms Ms. Tsou will have follow up with Dr. Loanne Drilling with [facility]monology. Next appointment is in May. Both member and Manuela Schwartz confirm that Ms. Geeslin will return home on Sunday, March 19th. Ms. Haberkorn lives alone but has supportive friends and neighbors.  ? ?Both are agreeable to [place] Management services. Manuela Schwartz states Ms. Feldkamp no longer goes to [facility] at [facility] [employer]. States Ms. Borrayo now goes to Dr. Manuella Ghazi with [facility] for PCP services. Ms. Goddard eligible for [place] Management because she sees [facility] affiliated pulmonary specialist with [place].  ? ?Discussed Ms. Devoto will continue with [facility] for outpatient palliative care services. Agreeable to writer contacting [facility] to make aware Ms. Meikle is returning home on Sunday, March 19th.  ? ?Confirmed best contact number for Ms. Perkovich is cell [phone] and home [phone]. ? ?Debbe Bales (DPR/HCPOA) contact number is [phone].   ? ?Spoke with Manus Gunning with [facility] to make aware Ms. Grall is slated to return home from [facility] and [place] on Sunday, March 19th. Manus Gunning states [facility] will follow. ? ?Will make referral to [facility] for complex case management services. Ms. Meditz has medical history of COPD, HLD, HTN, scoliosis. ? ? ?Marthenia Rolling, MSN, RN,BSN ?[place] Coordinator ?[phone] [facility]) ?[phone]  (Toll free office)  ? ? ? ? ?  ?

## 2021-08-13 NOTE — Patient Outreach (Signed)
[facility] Customer service manager [facility]) Care Management ? ?08/13/2021 ? ?Tasha [name] Proia ?[birth year] ?[phone] ? ? ?Received hospital referral from Raiford Noble, RN for for complex case management. PCP is at an embedded practice sent referral to [facility] to be assigned to embedded RN. ? ?Tasha [name] ?[facility] Care Management Assistant ?[phone] ? ?

## 2021-08-13 NOTE — Patient Instructions (Addendum)
COPD with emphysema ?Acute on chronic hypoxemic and hypercapneic respiratory failure ?--CONTINUE 2L at rest and 4L activity/sleep. Goal SpO2 >88% ?--START Trelegy ONE puff ONCE a day ?--CONTINUE Albuterol AS NEEDED for shortness of breath ?--Albuterol nebulizer meds ordered. Please let us know if you need a new machine ?--Plan for ambulatory O2 at next visit ? ?Follow-up with me in 2 months ?

## 2021-08-14 ENCOUNTER — Telehealth: Payer: Self-pay | Admitting: Pulmonary Disease

## 2021-08-14 NOTE — Telephone Encounter (Signed)
Called and spoke with Manus Gunning from [facility] who states that patient is requesting pallative care and they need a verbal order to move forward.  ? ?Please advise ?

## 2021-08-14 NOTE — Telephone Encounter (Signed)
Called and spoke with Manus Gunning to let her know that Dr. Loanne Drilling is ok with Palliative Care order. She expressed understanding. Nothing further needed at this time. ?

## 2021-08-14 NOTE — Telephone Encounter (Signed)
Yes. Appropriate for this patient to receive palliative care. ?

## 2021-08-23 ENCOUNTER — Other Ambulatory Visit: Payer: Self-pay | Admitting: Family Medicine

## 2021-08-23 DIAGNOSIS — J449 Chronic obstructive pulmonary disease, unspecified: Secondary | ICD-10-CM

## 2021-08-24 ENCOUNTER — Telehealth: Payer: Self-pay | Admitting: *Deleted

## 2021-08-24 NOTE — Chronic Care Management (AMB) (Signed)
?  Chronic Care Management  ? ?Outreach Note ? ?08/24/2021 ?Name: Tasha [name] MRN: [id number] DOB: 05/08/1951 ? ?Tasha [name] is a 71 y.o. year old female who is a primary care patient of Copland, Gay Filler, MD. I reached out to [place] by phone today in response to a referral sent by [name]'s primary care provider. ? ?An unsuccessful telephone outreach was attempted today. The patient was referred to the case management team for assistance with care management and care coordination.  ? ?Follow Up Plan: A HIPAA compliant phone message was left for the patient providing contact information and requesting a return call.  ? ?Jt Brabec, CCMA ?Care Guide, Embedded Care Coordination ?[place]  Care Management  ?Direct Dial: [phone] ? ? ?

## 2021-08-27 ENCOUNTER — Other Ambulatory Visit: Payer: Self-pay | Admitting: Family Medicine

## 2021-08-27 DIAGNOSIS — E7800 Pure hypercholesterolemia, unspecified: Secondary | ICD-10-CM

## 2021-08-27 MED ORDER — SIMVASTATIN 10 MG PO TABS: 10.0000 mg | ORAL_TABLET | Freq: Every day | ORAL | 0 refills | Status: AC

## 2021-09-03 NOTE — Chronic Care Management (AMB) (Signed)
?  Care Management  ? ?Outreach Note ? ?09/03/2021 ?Name: Tasha [name] MRN: [id number] DOB: February 06, 1951 ? ?Referred by: Pearline Cables, MD ?Reason for referral : Care Coordination (Initial outreach to schedule referral with [facility] ) ? ? ?A second unsuccessful telephone outreach was attempted today. The patient was referred to the case management team for assistance with care management and care coordination.  ? ?Follow Up Plan:  ?A HIPAA compliant phone message was left for the patient providing contact information and requesting a return call.  ? ?Danaly Bari, CCMA ?Care Guide, Embedded Care Coordination ?[place]  Care Management  ?Direct Dial: [phone] ? ? ?

## 2021-09-04 ENCOUNTER — Other Ambulatory Visit: Payer: Self-pay | Admitting: Family Medicine

## 2021-09-04 DIAGNOSIS — Z780 Asymptomatic menopausal state: Secondary | ICD-10-CM

## 2021-09-08 ENCOUNTER — Other Ambulatory Visit: Payer: Self-pay | Admitting: Family Medicine

## 2021-09-08 ENCOUNTER — Other Ambulatory Visit: Payer: Medicare Other

## 2021-09-08 DIAGNOSIS — E2839 Other primary ovarian failure: Secondary | ICD-10-CM

## 2021-09-17 NOTE — Chronic Care Management (AMB) (Signed)
?  Care Management  ? ?Outreach Note ? ?09/17/2021 ?Name: Tasha [name] MRN: [id number] DOB: [birth year] ? ?Referred by: Pearline Cables, MD ?Reason for referral : Care Coordination (Initial outreach to schedule referral with [facility] ) ? ? ?Third unsuccessful telephone outreach was attempted today. The patient was referred to the case management team for assistance with care management and care coordination. The patient's primary care provider has been notified of our unsuccessful attempts to make or maintain contact with the patient. The care management team is pleased to engage with this patient at any time in the future should he/she be interested in assistance from the care management team.  ? ?Follow Up Plan:  ?We have been unable to make contact with the patient for follow up. The care management team is available to follow up with the patient after provider conversation with the patient regarding recommendation for care management engagement and subsequent re-referral to the care management team.  ? ?Benz Vandenberghe, CCMA ?Care Guide, Embedded Care Coordination ?[facility]  Care Management  ?Direct Dial: [phone] ? ? ?

## 2021-09-18 ENCOUNTER — Encounter (HOSPITAL_COMMUNITY): Payer: Self-pay | Admitting: Internal Medicine

## 2021-09-18 ENCOUNTER — Emergency Department (HOSPITAL_COMMUNITY): Payer: Medicare Other

## 2021-09-18 ENCOUNTER — Other Ambulatory Visit: Payer: Self-pay

## 2021-09-18 ENCOUNTER — Inpatient Hospital Stay (HOSPITAL_COMMUNITY)
Admission: EM | Admit: 2021-09-18 | Discharge: 2021-09-21 | DRG: 189 | Disposition: A | Discharge status: Home Health | Payer: Medicare Other | Attending: Internal Medicine | Admitting: Internal Medicine

## 2021-09-18 DIAGNOSIS — J9601 Acute respiratory failure with hypoxia: Secondary | ICD-10-CM | POA: Diagnosis not present

## 2021-09-18 DIAGNOSIS — Z86718 Personal history of other venous thrombosis and embolism: Secondary | ICD-10-CM

## 2021-09-18 DIAGNOSIS — D72829 Elevated white blood cell count, unspecified: Secondary | ICD-10-CM | POA: Diagnosis present

## 2021-09-18 DIAGNOSIS — E7800 Pure hypercholesterolemia, unspecified: Secondary | ICD-10-CM | POA: Diagnosis present

## 2021-09-18 DIAGNOSIS — Z79899 Other long term (current) drug therapy: Secondary | ICD-10-CM

## 2021-09-18 DIAGNOSIS — Z20822 Contact with and (suspected) exposure to covid-19: Secondary | ICD-10-CM | POA: Diagnosis present

## 2021-09-18 DIAGNOSIS — F17210 Nicotine dependence, cigarettes, uncomplicated: Secondary | ICD-10-CM | POA: Diagnosis present

## 2021-09-18 DIAGNOSIS — M47816 Spondylosis without myelopathy or radiculopathy, lumbar region: Secondary | ICD-10-CM | POA: Diagnosis present

## 2021-09-18 DIAGNOSIS — R0902 Hypoxemia: Secondary | ICD-10-CM | POA: Diagnosis not present

## 2021-09-18 DIAGNOSIS — Z7951 Long term (current) use of inhaled steroids: Secondary | ICD-10-CM

## 2021-09-18 DIAGNOSIS — Z885 Allergy status to narcotic agent status: Secondary | ICD-10-CM

## 2021-09-18 DIAGNOSIS — J441 Chronic obstructive pulmonary disease with (acute) exacerbation: Secondary | ICD-10-CM | POA: Diagnosis present

## 2021-09-18 DIAGNOSIS — K219 Gastro-esophageal reflux disease without esophagitis: Secondary | ICD-10-CM | POA: Diagnosis present

## 2021-09-18 DIAGNOSIS — F32A Depression, unspecified: Secondary | ICD-10-CM | POA: Diagnosis present

## 2021-09-18 DIAGNOSIS — R739 Hyperglycemia, unspecified: Secondary | ICD-10-CM | POA: Diagnosis present

## 2021-09-18 DIAGNOSIS — Z720 Tobacco use: Secondary | ICD-10-CM | POA: Diagnosis present

## 2021-09-18 DIAGNOSIS — Z9851 Tubal ligation status: Secondary | ICD-10-CM

## 2021-09-18 DIAGNOSIS — Z888 Allergy status to other drugs, medicaments and biological substances status: Secondary | ICD-10-CM

## 2021-09-18 DIAGNOSIS — D751 Secondary polycythemia: Secondary | ICD-10-CM | POA: Diagnosis present

## 2021-09-18 DIAGNOSIS — J9602 Acute respiratory failure with hypercapnia: Secondary | ICD-10-CM | POA: Diagnosis not present

## 2021-09-18 DIAGNOSIS — Z91048 Other nonmedicinal substance allergy status: Secondary | ICD-10-CM

## 2021-09-18 DIAGNOSIS — R7303 Prediabetes: Secondary | ICD-10-CM | POA: Diagnosis present

## 2021-09-18 DIAGNOSIS — Z7982 Long term (current) use of aspirin: Secondary | ICD-10-CM

## 2021-09-18 DIAGNOSIS — Z800 Family history of malignant neoplasm of digestive organs: Secondary | ICD-10-CM | POA: Insufficient documentation

## 2021-09-18 DIAGNOSIS — I10 Essential (primary) hypertension: Secondary | ICD-10-CM | POA: Diagnosis present

## 2021-09-18 DIAGNOSIS — T380X5A Adverse effect of glucocorticoids and synthetic analogues, initial encounter: Secondary | ICD-10-CM | POA: Diagnosis present

## 2021-09-18 DIAGNOSIS — Z8249 Family history of ischemic heart disease and other diseases of the circulatory system: Secondary | ICD-10-CM

## 2021-09-18 DIAGNOSIS — R1033 Periumbilical pain: Secondary | ICD-10-CM | POA: Insufficient documentation

## 2021-09-18 DIAGNOSIS — F419 Anxiety disorder, unspecified: Secondary | ICD-10-CM | POA: Diagnosis present

## 2021-09-18 DIAGNOSIS — Z833 Family history of diabetes mellitus: Secondary | ICD-10-CM

## 2021-09-18 LAB — BLOOD GAS, ARTERIAL
Acid-Base Excess: 3.4 mmol/L — ABNORMAL HIGH (ref 0.0–2.0)
Bicarbonate: 30.1 mmol/L — ABNORMAL HIGH (ref 20.0–28.0)
Delivery systems: POSITIVE
Drawn by: 331441
Expiratory PAP: 5 cmH2O
FIO2: 30 %
Inspiratory PAP: 10 cmH2O
O2 Saturation: 97.2 %
Patient temperature: 37
RATE: 10 resp/min
pCO2 arterial: 52 mmHg — ABNORMAL HIGH (ref 32–48)
pH, Arterial: 7.37 (ref 7.35–7.45)
pO2, Arterial: 75 mmHg — ABNORMAL LOW (ref 83–108)

## 2021-09-18 LAB — CBC WITH DIFFERENTIAL/PLATELET
Abs Immature Granulocytes: 0.06 10*3/uL (ref 0.00–0.07)
Basophils Absolute: 0 10*3/uL (ref 0.0–0.1)
Basophils Relative: 0 %
Eosinophils Absolute: 0.1 10*3/uL (ref 0.0–0.5)
Eosinophils Relative: 1 %
HCT: 47.9 % — ABNORMAL HIGH (ref 36.0–46.0)
Hemoglobin: 15.8 g/dL — ABNORMAL HIGH (ref 12.0–15.0)
Immature Granulocytes: 0 %
Lymphocytes Relative: 7 %
Lymphs Abs: 1.2 10*3/uL (ref 0.7–4.0)
MCH: 31.9 pg (ref 26.0–34.0)
MCHC: 33 g/dL (ref 30.0–36.0)
MCV: 96.6 fL (ref 80.0–100.0)
Monocytes Absolute: 0.9 10*3/uL (ref 0.1–1.0)
Monocytes Relative: 5 %
Neutro Abs: 14.8 10*3/uL — ABNORMAL HIGH (ref 1.7–7.7)
Neutrophils Relative %: 87 %
Platelets: 332 10*3/uL (ref 150–400)
RBC: 4.96 MIL/uL (ref 3.87–5.11)
RDW: 14.5 % (ref 11.5–15.5)
WBC: 17.1 10*3/uL — ABNORMAL HIGH (ref 4.0–10.5)
nRBC: 0 % (ref 0.0–0.2)

## 2021-09-18 LAB — RESP PANEL BY RT-PCR (FLU A&B, COVID) ARPGX2
Influenza A by PCR: NEGATIVE
Influenza B by PCR: NEGATIVE
SARS Coronavirus 2 by RT PCR: NEGATIVE

## 2021-09-18 LAB — BASIC METABOLIC PANEL
Anion gap: 9 (ref 5–15)
BUN: 24 mg/dL — ABNORMAL HIGH (ref 8–23)
CO2: 26 mmol/L (ref 22–32)
Calcium: 9.9 mg/dL (ref 8.9–10.3)
Chloride: 106 mmol/L (ref 98–111)
Creatinine, Ser: 0.72 mg/dL (ref 0.44–1.00)
GFR, Estimated: >60 mL/min (ref >60)
Glucose, Bld: 180 mg/dL — ABNORMAL HIGH (ref 70–99)
Potassium: 3.8 mmol/L (ref 3.5–5.1)
Sodium: 141 mmol/L (ref 135–145)

## 2021-09-18 LAB — GLUCOSE, CAPILLARY
Glucose-Capillary: 260 mg/dL — ABNORMAL HIGH (ref 70–99)
Glucose-Capillary: 132 mg/dL — ABNORMAL HIGH (ref 70–99)
Glucose-Capillary: 107 mg/dL — ABNORMAL HIGH (ref 70–99)

## 2021-09-18 LAB — CBG MONITORING, ED: Glucose-Capillary: 221 mg/dL — ABNORMAL HIGH (ref 70–99)

## 2021-09-18 LAB — MRSA NEXT GEN BY PCR, NASAL: MRSA by PCR Next Gen: NOT DETECTED

## 2021-09-18 MED ORDER — ORAL CARE MOUTH RINSE
15.0000 mL | Freq: Two times a day (BID) | OROMUCOSAL | Status: DC
  Administered 2021-09-18 – 2021-09-21 (×6): 15 mL via OROMUCOSAL

## 2021-09-18 MED ORDER — IPRATROPIUM BROMIDE 0.02 % IN SOLN
0.5000 mg | Freq: Once | RESPIRATORY_TRACT | Status: AC
  Administered 2021-09-18: 0.5 mg via RESPIRATORY_TRACT

## 2021-09-18 MED ORDER — ACETAMINOPHEN 325 MG PO TABS
650.0000 mg | ORAL_TABLET | Freq: Four times a day (QID) | ORAL | Status: DC | PRN
  Administered 2021-09-19: 650 mg via ORAL
  Filled 2021-09-18: qty 2

## 2021-09-18 MED ORDER — ALBUTEROL SULFATE (2.5 MG/3ML) 0.083% IN NEBU
20.0000 mg/h | INHALATION_SOLUTION | Freq: Once | RESPIRATORY_TRACT | Status: DC
Start: 2021-09-18 — End: 2021-09-18

## 2021-09-18 MED ORDER — ESCITALOPRAM OXALATE 10 MG PO TABS
10.0000 mg | ORAL_TABLET | Freq: Every day | ORAL | Status: DC
  Administered 2021-09-19 – 2021-09-21 (×3): 10 mg via ORAL
  Filled 2021-09-18 (×3): qty 1

## 2021-09-18 MED ORDER — BUSPIRONE HCL 5 MG PO TABS
15.0000 mg | ORAL_TABLET | Freq: Two times a day (BID) | ORAL | Status: DC | PRN
  Administered 2021-09-18 – 2021-09-21 (×4): 15 mg via ORAL
  Filled 2021-09-18 (×4): qty 1

## 2021-09-18 MED ORDER — SIMVASTATIN 10 MG PO TABS
10.0000 mg | ORAL_TABLET | Freq: Every day | ORAL | Status: DC
  Administered 2021-09-19 – 2021-09-21 (×3): 10 mg via ORAL
  Filled 2021-09-18 (×3): qty 1

## 2021-09-18 MED ORDER — PREDNISONE 20 MG PO TABS
40.0000 mg | ORAL_TABLET | Freq: Every day | ORAL | Status: DC
  Administered 2021-09-19 – 2021-09-21 (×3): 40 mg via ORAL
  Filled 2021-09-18 (×3): qty 2

## 2021-09-18 MED ORDER — ONDANSETRON HCL 4 MG PO TABS: 4.0000 mg | ORAL_TABLET | Freq: Four times a day (QID) | ORAL | Status: DC | PRN

## 2021-09-18 MED ORDER — INSULIN ASPART 100 UNIT/ML IJ SOLN
0.0000 [IU] | Freq: Three times a day (TID) | INTRAMUSCULAR | Status: DC
  Administered 2021-09-18: 8 [IU] via SUBCUTANEOUS
  Administered 2021-09-18 – 2021-09-19 (×2): 2 [IU] via SUBCUTANEOUS
  Administered 2021-09-20: 3 [IU] via SUBCUTANEOUS
  Administered 2021-09-20: 2 [IU] via SUBCUTANEOUS
  Filled 2021-09-18: qty 0.15

## 2021-09-18 MED ORDER — ONDANSETRON HCL 4 MG/2ML IJ SOLN: 4.0000 mg | Freq: Four times a day (QID) | INTRAMUSCULAR | Status: DC | PRN

## 2021-09-18 MED ORDER — FUROSEMIDE 20 MG PO TABS
20.0000 mg | ORAL_TABLET | Freq: Every day | ORAL | Status: DC
  Administered 2021-09-19 – 2021-09-21 (×3): 20 mg via ORAL
  Filled 2021-09-18 (×3): qty 1

## 2021-09-18 MED ORDER — ALBUTEROL SULFATE (2.5 MG/3ML) 0.083% IN NEBU
20.0000 mg | INHALATION_SOLUTION | Freq: Once | RESPIRATORY_TRACT | Status: AC
  Administered 2021-09-18: 20 mg via RESPIRATORY_TRACT
  Filled 2021-09-18 (×2): qty 24

## 2021-09-18 MED ORDER — ASPIRIN EC 81 MG PO TBEC
81.0000 mg | DELAYED_RELEASE_TABLET | Freq: Every day | ORAL | Status: DC
  Administered 2021-09-18 – 2021-09-21 (×4): 81 mg via ORAL
  Filled 2021-09-18 (×4): qty 1

## 2021-09-18 MED ORDER — ACETAMINOPHEN 650 MG RE SUPP: 650.0000 mg | Freq: Four times a day (QID) | RECTAL | Status: DC | PRN

## 2021-09-18 MED ORDER — ENOXAPARIN SODIUM 40 MG/0.4ML IJ SOSY
40.0000 mg | PREFILLED_SYRINGE | INTRAMUSCULAR | Status: DC
  Administered 2021-09-18 – 2021-09-21 (×4): 40 mg via SUBCUTANEOUS
  Filled 2021-09-18 (×4): qty 0.4

## 2021-09-18 MED ORDER — CHLORHEXIDINE GLUCONATE CLOTH 2 % EX PADS
6.0000 | MEDICATED_PAD | Freq: Every day | CUTANEOUS | Status: DC
  Administered 2021-09-18 – 2021-09-20 (×3): 6 via TOPICAL

## 2021-09-18 MED ORDER — ALBUTEROL SULFATE (2.5 MG/3ML) 0.083% IN NEBU
INHALATION_SOLUTION | RESPIRATORY_TRACT | Status: AC
  Filled 2021-09-18: qty 3

## 2021-09-18 MED ORDER — IPRATROPIUM-ALBUTEROL 0.5-2.5 (3) MG/3ML IN SOLN
3.0000 mL | Freq: Four times a day (QID) | RESPIRATORY_TRACT | Status: DC
  Administered 2021-09-18 – 2021-09-20 (×7): 3 mL via RESPIRATORY_TRACT
  Filled 2021-09-18 (×7): qty 3

## 2021-09-18 NOTE — ED Notes (Addendum)
Respiratory was called for ABG

## 2021-09-18 NOTE — Progress Notes (Signed)
? ?  This pt is currently enrolled in [facility] a home based Palliative Care program that is provided by [facility] She was enrolled on 08/24/21 for diagnosis of chronic hypoxic and hypercapnic respiratory failure. She is receiving services of Nursing 1-3 X month and SW as well. She does have oxygen and nebulizer in home. Provided by [employer].  She has a MOST form and DNR at home with our services. She lives at home alone and has little support in the home. Er baseline is able to walk unassisted around 20 feet before getting SOB and starts using pursed lip breathing.  ? ?Pt is eligible for additional services with [facility] agency and our program if needed at d/c.  ? ?We will continue to follow and assist with d/c needs.  ?Norm Parcel RN  [phone]  ?

## 2021-09-18 NOTE — ED Provider Notes (Signed)
MSE was initiated and I personally evaluated the patient and placed orders (if any) at  6:58 AM on September 18, 2021. ? ?The patient appears stable so that the remainder of the MSE may be completed by another provider. ? ?Patient with COPD comes in with increasing dyspnea since yesterday afternoon, not responding to nebulizer treatments at home.  She is coming by ambulance where she received 2 additional nebulizer treatments as well as methylprednisolone and magnesium.  On exam, she is dyspneic at rest and hypoxic with oxygen saturation 82%.  Lungs have diffuse wheezes.  We will continue nebulizer treatments and get chest x-ray and ABG to make sure she is not retaining CO2. ?  ?Tasha Booze, MD ?09/18/21 [phone] ? ?

## 2021-09-18 NOTE — Progress Notes (Signed)
RT took pt off BIPAP for break. Pt on 3 LPM [place]. No distress noted at this time.  ?

## 2021-09-18 NOTE — Progress Notes (Signed)
Patients HCPOA, Nettie Elm, gave papers regarding patient not wanting to be intubated. I spoke with HCPOA and patient about not wanting to be intubated in  case of an emergency and patient stated she did not want intubation in case of emergency. MD made aware of DNR wishes, papers placed on chart and ordered placed in patients electronic chart ?

## 2021-09-18 NOTE — Chronic Care Management (AMB) (Signed)
?  Care Management  ? ?Note ? ?09/18/2021 ?Name: Tasha [name] MRN: [id number] DOB: 1951-05-24 ? ?Tasha [name] is a 71 y.o. year old female who is a primary care patient of Copland, Gwenlyn Found, MD. I reached out to Fabian November Hebard by phone today offer care coordination services.  ? ?[name] was given information about care management services today including:  ?Care management services include personalized support from designated clinical staff supervised by her physician, including individualized plan of care and coordination with other care providers ?24/7 contact phone numbers for assistance for urgent and routine care needs. ?The patient may stop care management services at any time by phone call to the office staff. ? ?Darl Pikes [facility]) agreed to services and verbal consent obtained.  ? ?Follow up plan: ?Telephone appointment with care management team member scheduled for: 09/22/2021 ? ?Ande Therrell, CCMA ?Care Guide, Embedded Care Coordination ?[facility]  Care Management  ?Direct Dial: [phone] ? ? ?

## 2021-09-18 NOTE — ED Notes (Signed)
Patient noted in acute respiratory distress.  MD to bedside.  Patient reports no hx of needing intubation ?

## 2021-09-18 NOTE — ED Triage Notes (Signed)
Patient BIB EMS for evaluation of shortness of breath.  States symptoms started one hour prior to arrival.  Tried inhaler at home without improvement.  Hx of COPD.  Given DuoNeb x 1, Solu-Medrol 125 mg IV, and Magnesium 2 grams IV by EMS. ?

## 2021-09-18 NOTE — Progress Notes (Signed)
Pt back on BIPAP due to WOB and SOB. ?

## 2021-09-18 NOTE — Progress Notes (Signed)
RT NOTE:  Pt transported from ED to ICU via BiPAP with no complications noted.  

## 2021-09-18 NOTE — H&P (Signed)
?History and Physical  ? ? ?Patient: Tasha [name] WJX:[id number] DOB: Sep 28, 1950 ?DOA: 09/18/2021 ?DOS: the patient was seen and examined on 09/18/2021 ?PCP: Pearline Cables, MD  ?Patient coming from: Home ? ?Chief Complaint:  ?Chief Complaint  ?Patient presents with  ? Shortness of Breath  ? ?HPI: Tasha [name] is a 71 y.o. female with medical history significant of abnormal nuclear stress test, allergies, anxiety, depression, osteoarthritis of the lumbar spine, asthma/COPD, history of DVT, GERD, hyperlipidemia, hypertension, psoriasiform dermatitis who came into the emergency department via EMS due to shortness of breath since yesterday evening that got significantly worse around 0500 today associated with wheezing and a nonproductive cough.  She denies fever, chills, sore throat, hemoptysis, chest pain, palpitations, diaphoresis, PND, orthopnea or pitting edema of the lower extremities.  No abdominal or flank pain, nausea, emesis, diarrhea, melena, hematochezia, dysuria, frequency, hematuria, polyuria, polydipsia, polyphagia or blurred vision. ? ?ED course: Initial vital signs were temperature  98.8 ?F, pulse 90, respirations 36, BP 141/90 mmHg O2 sat 97% on room air.  The patient received a continuous albuterol 20 and ipratropium 0.5 mg neb while in the ER. ? ?Lab work: CBC  Showed awhite count 17.1, hemoglobin 15.8 g/dL platelets 213.  ABG show a PCO2 of 52 and PO2 of 75 mmHg.  Bicarbonate was 30.1 and acid-base ASSO 3.4 mmol/L.  BMP with a glucose of 188 BUN of 24 mg/dL.  Coronavirus and influenza PCR negative. ? ?Imaging: A one-view portable chest radiograph did not show any cardiopulmonary pathology. ? ?Review of Systems: As mentioned in the history of present illness. All other systems reviewed and are negative. ?Past Medical History:  ?Diagnosis Date  ? Abnormal nuclear stress test 06/10/2015  ? Allergy   ? Anxiety   ? Arthritis   ? DDD lumbar; B thumbs  ? Asthma   ? age 57  ? Clotting  disorder (HCC)   ? Depression   ? DVT (deep venous thrombosis) (HCC)   ? GERD (gastroesophageal reflux disease)   ? Hyperlipidemia   ? Hypertension   ? Psoriasiform dermatitis   ? ?Past Surgical History:  ?Procedure Laterality Date  ? NOSE SURGERY    ? TONSILLECTOMY    ? TUBAL LIGATION    ? ?Social History:  reports that she has been smoking cigarettes. She has a 23.00 pack-year smoking history. She has never used smokeless tobacco. She reports that she does not drink alcohol and does not use drugs. ? ?Allergies  ?Allergen Reactions  ? Acitretin Swelling and Rash  ? Pollen Extract Shortness Of Breath  ?  Leaves, pt. reports  ? Percodan [Oxycodone-Aspirin]   ?  Pt does not remember  ? Statins Other (See Comments)  ?  Muscle aches in legs ?  ? ? ?Family History  ?Problem Relation Age of Onset  ? Cancer Mother 18  ?     colorectal cancer  ? Stroke Mother 51  ?     CVA  ? Alcohol abuse Mother   ? Diabetes Mother   ? Hypertension Mother   ? Heart disease Father   ? Cancer Maternal Grandfather   ?     lung  ? Alcohol abuse Brother   ? Heart attack Brother   ? Cancer Maternal Aunt   ?     breast  ? Breast cancer Maternal Aunt 71  ? ? ?Prior to Admission medications   ?Medication Sig Start Date End Date Taking? Authorizing Provider  ?albuterol (ACCUNEB) 1.25 MG/3ML  nebulizer solution Take 3 mLs (1.25 mg total) by nebulization every 6 (six) hours as needed for wheezing. 08/13/21   Luciano CutterEllison, Chi Jane, MD  ?albuterol (VENTOLIN HFA) 108 (90 Base) MCG/ACT inhaler Inhale 2 puffs into the lungs every 4 (four) hours as needed for wheezing or shortness of breath. 07/19/21 08/18/21  Pahwani, Kasandra Knudseninka R, MD  ?albuterol (VENTOLIN HFA) 108 (90 Base) MCG/ACT inhaler Inhale 2 puffs into the lungs every 6 (six) hours as needed for wheezing or shortness of breath. 08/13/21   Luciano CutterEllison, Chi Jane, MD  ?aspirin EC 81 MG tablet Take 81 mg by mouth daily. Swallow whole.    [provider]  ?busPIRone (BUSPAR) 15 MG tablet Take 1 tablet (15 mg  total) by mouth 2 (two) times daily. ?Patient taking differently: Take 15 mg by mouth 2 (two) times daily as needed. 05/08/21   Copland, Gwenlyn FoundJessica C, MD  ?escitalopram (LEXAPRO) 10 MG tablet Take 1 tablet (10 mg total) by mouth daily. 10/08/20   Copland, Gwenlyn FoundJessica C, MD  ?Fluticasone-Umeclidin-Vilant (TRELEGY ELLIPTA) 200-62.5-25 MCG/ACT AEPB Inhale 1 puff into the lungs daily. 08/13/21   Luciano CutterEllison, Chi Jane, MD  ?furosemide (LASIX) 20 MG tablet Take 1 tablet (20 mg total) by mouth daily. 07/25/21 08/24/21  Zigmund DanielPowell, A Caldwell Jr., MD  ?hydrOXYzine (ATARAX) 10 MG tablet Hydroxyzine    [provider]  ?ipratropium-albuterol (DUONEB) 0.5-2.5 (3) MG/3ML SOLN INHALE 3MLS VIA NEBULIZER EVERY 6 HOURS AS NEEDED 07/30/19   Copland, Gwenlyn FoundJessica C, MD  ?magnesium 30 MG tablet Take 30 mg by mouth daily.    [provider]  ?Multiple Vitamins-Minerals (MULTIVITAMIN ADULTS 50+) TABS Take 1 tablet by mouth 3 (three) times a week.    [provider]  ?simvastatin (ZOCOR) 10 MG tablet Take 1 tablet (10 mg total) by mouth daily. 08/27/21   Copland, Gwenlyn FoundJessica C, MD  ? ? ?Physical Exam: ?Vitals:  ? 09/18/21 0730 09/18/21 0738 09/18/21 0745  ?BP: (!) 141/90  (!) 146/86  ?Pulse: 90  96  ?Resp: (!) 36  (!) 37  ?SpO2: 97% 100% 96%  ? ?Physical Exam ?Vitals and nursing note reviewed.  ?Constitutional:   ?   Appearance: She is normal weight.  ?   Comments: BiPAP mask in place.  ?HENT:  ?   Head: Normocephalic.  ?   Mouth/Throat:  ?   Mouth: Mucous membranes are moist.  ?Eyes:  ?   General: No scleral icterus. ?   Pupils: Pupils are equal, round, and reactive to light.  ?Neck:  ?   Vascular: No JVD.  ?Cardiovascular:  ?   Rate and Rhythm: Regular rhythm.  ?   Heart sounds: S1 normal and S2 normal.  ?Pulmonary:  ?   Breath sounds: Decreased breath sounds and wheezing present. No rhonchi or rales.  ?Chest:  ?   Comments: No accessory muscle use. ?Abdominal:  ?   General: There is no distension.  ?   Palpations: Abdomen is soft.  ?    Tenderness: There is no abdominal tenderness. There is no guarding.  ?Musculoskeletal:  ?   Cervical back: Neck supple.  ?   Right lower leg: No edema.  ?   Left lower leg: No edema.  ?Skin: ?   General: Skin is warm and dry.  ?Neurological:  ?   General: No focal deficit present.  ?   Mental Status: She is alert and oriented to person, place, and time.  ? ?Data Reviewed: ? ?There are no new results to review  at this time. ? ?Assessment and Plan: ?Principal Problem: ?  Acute respiratory failure with hypoxia and hypercapnia (HCC) ?In the setting of: ?  COPD with acute exacerbation (HCC) ?Observation/PCU. ?Continue supplemental oxygen. ?Continue BiPAP ventilation as needed. ?DuoNebs 4 times a day. ?Albuterol neb as needed every 4 hours. ?Prednisone 40 mg p.o. daily in a.m. ?Smoking cessation advised. ? ?Active Problems: ?  Prediabetes ?Hemoglobin A1C 6.0% 2 months ago. ?Carbohydrate modified diet. ?CBG monitoring with [facility]. ? ?  Essential hypertension ?Continue furosemide 20 mg p.o. daily. ? ?  Hypercholesteremia ?Continue simvastatin 20 mg p.o. daily. ? ?  Anxiety and depression ?Continue escitalopram 10 mg p.o. daily. ?Continue buspirone 50 mg twice daily as needed. ?Outpatient follow-up with PCP and/or [facility]. ? ?  Tobacco abuse ?As stated she quit 2 days ago. ?Declined nicotine replacement therapy. ? ?  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 ?Smoking cessation. ? ? ? Advance Care Planning:   Code Status: Full Code  ? ?Consults:  ? ?Family Communication:  ? ?Severity of Illness: ?The appropriate patient status for this patient is OBSERVATION. Observation status is judged to be reasonable and necessary in order to provide the required intensity of service to ensure the patient's safety. The patient's presenting symptoms, physical exam findings, and initial radiographic and laboratory data in the context of their medical condition is felt to place them at decreased risk for further clinical deterioration.  Furthermore, it is anticipated that the patient will be medically stable for discharge from the hospital within 2 midnights of admission.  ? ?Author: ?Bobette Mo, MD ?09/18/2021 8:48 AM ? ?For on call revie

## 2021-09-18 NOTE — ED Provider Notes (Signed)
?[place] DEPT ?Provider Note ? ? ?CSN: [id number] ?Arrival date & time: 09/18/21  [id number] ? ?  ? ?History ? ?Chief Complaint  ?Patient presents with  ? Shortness of Breath  ? ? ?Tasha [name] is a 71 y.o. female. ? ?Patient is a 71 year old female with a history of COPD, prior DVT, hypertension, hyperlipidemia who is presenting by ambulance today due to complaints of shortness of breath.  Patient reports that really became severe last night around 5 PM.  She was trying to use her nebulizers at home but reported through the night it did not get better.  She has had a nonproductive cough, no fever.  She denies any chest pain, abdominal pain, vomiting or diarrhea.  She has not noticed any swelling in her legs.  EMS reports that upon their arrival patient was hypoxic.  She received a DuoNeb, oxygen therapy, 125 mg of Solu-Medrol and magnesium.  Patient reports she does not feel significantly better after those treatments.  She denies any history of intubation for breathing troubles.  Upon arrival to the emergency room patient was satting 82% on room air and she does  use home oxygen.  She was placed on a nonrebreather with sats of 100%. ? ? ?Shortness of Breath ? ?  ? ?Home Medications ?Prior to Admission medications   ?Medication Sig Start Date End Date Taking? Authorizing Provider  ?albuterol (ACCUNEB) 1.25 MG/3ML nebulizer solution Take 3 mLs (1.25 mg total) by nebulization every 6 (six) hours as needed for wheezing. 08/13/21   Margaretha Seeds, MD  ?albuterol (VENTOLIN HFA) 108 (90 Base) MCG/ACT inhaler Inhale 2 puffs into the lungs every 4 (four) hours as needed for wheezing or shortness of breath. 07/19/21 08/18/21  Pahwani, Michell Heinrich, MD  ?albuterol (VENTOLIN HFA) 108 (90 Base) MCG/ACT inhaler Inhale 2 puffs into the lungs every 6 (six) hours as needed for wheezing or shortness of breath. 08/13/21   Margaretha Seeds, MD  ?aspirin EC 81 MG tablet Take 81 mg by mouth daily. Swallow  whole.    [provider]  ?busPIRone (BUSPAR) 15 MG tablet Take 1 tablet (15 mg total) by mouth 2 (two) times daily. ?Patient taking differently: Take 15 mg by mouth 2 (two) times daily as needed. 05/08/21   Copland, Gay Filler, MD  ?escitalopram (LEXAPRO) 10 MG tablet Take 1 tablet (10 mg total) by mouth daily. 10/08/20   Copland, Gay Filler, MD  ?Fluticasone-Umeclidin-Vilant (TRELEGY ELLIPTA) 200-62.5-25 MCG/ACT AEPB Inhale 1 puff into the lungs daily. 08/13/21   Margaretha Seeds, MD  ?furosemide (LASIX) 20 MG tablet Take 1 tablet (20 mg total) by mouth daily. 07/25/21 08/24/21  Elodia Florence., MD  ?hydrOXYzine (ATARAX) 10 MG tablet Hydroxyzine    [provider]  ?ipratropium-albuterol (DUONEB) 0.5-2.5 (3) MG/3ML SOLN INHALE 3MLS VIA NEBULIZER EVERY 6 HOURS AS NEEDED 07/30/19   Copland, Gay Filler, MD  ?magnesium 30 MG tablet Take 30 mg by mouth daily.    [provider]  ?Multiple Vitamins-Minerals (MULTIVITAMIN ADULTS 50+) TABS Take 1 tablet by mouth 3 (three) times a week.    [provider]  ?simvastatin (ZOCOR) 10 MG tablet Take 1 tablet (10 mg total) by mouth daily. 08/27/21   Copland, Gay Filler, MD  ?   ? ?Allergies    ?Acitretin, Pollen extract, Percodan [oxycodone-aspirin], and Statins   ? ?Review of Systems   ?Review of Systems  ?Respiratory:  Positive for shortness of breath.   ? ?Physical  Exam ?Updated Vital Signs ?BP (!) 146/86   Pulse 96   Resp (!) 37   SpO2 96%  ?Physical Exam ?Vitals and nursing note reviewed.  ?Constitutional:   ?   General: She is not in acute distress. ?   Appearance: She is well-developed. She is diaphoretic.  ?HENT:  ?   Head: Normocephalic and atraumatic.  ?   Mouth/Throat:  ?   Mouth: Mucous membranes are dry.  ?Eyes:  ?   Pupils: Pupils are equal, round, and reactive to light.  ?Cardiovascular:  ?   Rate and Rhythm: Normal rate and regular rhythm.  ?   Heart sounds: Normal heart sounds. No murmur heard. ?  No friction rub.  ?Pulmonary:   ?   Effort: Pulmonary effort is normal. Tachypnea present.  ?   Breath sounds: Wheezing present. No rales.  ?Abdominal:  ?   General: Bowel sounds are normal. There is no distension.  ?   Palpations: Abdomen is soft.  ?   Tenderness: There is no abdominal tenderness. There is no guarding or rebound.  ?Musculoskeletal:     ?   General: No tenderness. Normal range of motion.  ?   Cervical back: Normal range of motion and neck supple.  ?   Right lower leg: No edema.  ?   Left lower leg: No edema.  ?   Comments: No edema  ?Skin: ?   General: Skin is warm.  ?   Findings: No rash.  ?Neurological:  ?   Mental Status: She is alert and oriented to person, place, and time. Mental status is at baseline.  ?   Cranial Nerves: No cranial nerve deficit.  ?Psychiatric:     ?   Mood and Affect: Mood normal.     ?   Behavior: Behavior normal.  ? ? ?ED Results / Procedures / Treatments   ?Labs ?(all labs ordered are listed, but only abnormal results are displayed) ?Labs Reviewed  ?BASIC METABOLIC PANEL - Abnormal; Notable for the following components:  ?    Result Value  ? Glucose, Bld 180 (*)   ? BUN 24 (*)   ? All other components within normal limits  ?CBC WITH DIFFERENTIAL/PLATELET - Abnormal; Notable for the following components:  ? WBC 17.1 (*)   ? Hemoglobin 15.8 (*)   ? HCT 47.9 (*)   ? Neutro Abs 14.8 (*)   ? All other components within normal limits  ?BLOOD GAS, ARTERIAL - Abnormal; Notable for the following components:  ? pCO2 arterial 52 (*)   ? pO2, Arterial 75 (*)   ? Bicarbonate 30.1 (*)   ? Acid-Base Excess 3.4 (*)   ? All other components within normal limits  ?RESP PANEL BY RT-PCR (FLU A&B, COVID) [id number]  ? ? ?EKG ?EKG Interpretation ? ?Date/Time:  Friday September 18 2021 06:59:14 EDT ?Ventricular Rate:  93 ?PR Interval:  127 ?QRS Duration: 95 ?QT Interval:  394 ?QTC Calculation: 491 ?R Axis:   32 ?Text Interpretation: Sinus rhythm Probable left atrial enlargement No significant change since last tracing Confirmed by  Blanchie Dessert [phone]) on 09/18/2021 7:45:46 AM ? ?Radiology ?DG Chest Port 1 View ? ?Result Date: 09/18/2021 ?CLINICAL DATA:  71 year old female with history of shortness of breath. EXAM: PORTABLE CHEST 1 VIEW COMPARISON:  Chest x-ray 07/14/2021. FINDINGS: Lung volumes are normal. No consolidative airspace disease. No pleural effusions. No pneumothorax. No pulmonary nodule or mass noted. Pulmonary vasculature and the cardiomediastinal silhouette are within normal limits. IMPRESSION:  No radiographic evidence of acute cardiopulmonary disease. Electronically Signed   By: Vinnie Langton M.D.   On: 09/18/2021 07:22   ? ?Procedures ?Procedures  ? ? ?Medications Ordered in ED ?Medications  ?albuterol (PROVENTIL) (2.5 MG/3ML) 0.083% nebulizer solution (  Not Given 09/18/21 0800)  ?ipratropium (ATROVENT) nebulizer solution 0.5 mg (0.5 mg Nebulization Given 09/18/21 0752)  ?albuterol (PROVENTIL) (2.5 MG/3ML) 0.083% nebulizer solution 20 mg (20 mg Nebulization Given 09/18/21 0752)  ? ? ?ED Course/ Medical Decision Making/ A&P ?  ?                        ?Medical Decision Making ?Amount and/or Complexity of Data Reviewed ?External Data Reviewed: notes. ?Labs: ordered. Decision-making details documented in ED Course. ?Radiology: ordered and independent interpretation performed. Decision-making details documented in ED Course. ?ECG/medicine tests: ordered and independent interpretation performed. Decision-making details documented in ED Course. ? ?Risk ?Prescription drug management. ?Decision regarding hospitalization. ? ? ?Elderly female with significant medical problems presenting today with shortness of breath and hypoxia.  Patient is awake and alert and still able to answer questions however she is tachypneic and diffusely wheezing.  She has already received Solu-Medrol, magnesium and DuoNeb with minimal improvement.  Patient denies any infectious symptoms such as productive cough, fever, chest pain.  She has had no nausea  vomiting and has no abdominal pain.  She has no unilateral leg pain and swelling and lower suspicion for PE at this time.  Lower suspicion for pneumonia or ACS.  I have independently interpreted patient's

## 2021-09-19 DIAGNOSIS — Z7951 Long term (current) use of inhaled steroids: Secondary | ICD-10-CM | POA: Diagnosis not present

## 2021-09-19 DIAGNOSIS — F17210 Nicotine dependence, cigarettes, uncomplicated: Secondary | ICD-10-CM | POA: Diagnosis present

## 2021-09-19 DIAGNOSIS — F419 Anxiety disorder, unspecified: Secondary | ICD-10-CM

## 2021-09-19 DIAGNOSIS — Z888 Allergy status to other drugs, medicaments and biological substances status: Secondary | ICD-10-CM | POA: Diagnosis not present

## 2021-09-19 DIAGNOSIS — M47816 Spondylosis without myelopathy or radiculopathy, lumbar region: Secondary | ICD-10-CM | POA: Diagnosis present

## 2021-09-19 DIAGNOSIS — J441 Chronic obstructive pulmonary disease with (acute) exacerbation: Secondary | ICD-10-CM | POA: Diagnosis present

## 2021-09-19 DIAGNOSIS — R0902 Hypoxemia: Secondary | ICD-10-CM | POA: Diagnosis present

## 2021-09-19 DIAGNOSIS — Z885 Allergy status to narcotic agent status: Secondary | ICD-10-CM | POA: Diagnosis not present

## 2021-09-19 DIAGNOSIS — F32A Depression, unspecified: Secondary | ICD-10-CM

## 2021-09-19 DIAGNOSIS — J9602 Acute respiratory failure with hypercapnia: Secondary | ICD-10-CM | POA: Diagnosis present

## 2021-09-19 DIAGNOSIS — T380X5A Adverse effect of glucocorticoids and synthetic analogues, initial encounter: Secondary | ICD-10-CM | POA: Diagnosis present

## 2021-09-19 DIAGNOSIS — Z86718 Personal history of other venous thrombosis and embolism: Secondary | ICD-10-CM | POA: Diagnosis not present

## 2021-09-19 DIAGNOSIS — K219 Gastro-esophageal reflux disease without esophagitis: Secondary | ICD-10-CM | POA: Diagnosis present

## 2021-09-19 DIAGNOSIS — Z91048 Other nonmedicinal substance allergy status: Secondary | ICD-10-CM | POA: Diagnosis not present

## 2021-09-19 DIAGNOSIS — R7303 Prediabetes: Secondary | ICD-10-CM

## 2021-09-19 DIAGNOSIS — Z20822 Contact with and (suspected) exposure to covid-19: Secondary | ICD-10-CM | POA: Diagnosis present

## 2021-09-19 DIAGNOSIS — Z7982 Long term (current) use of aspirin: Secondary | ICD-10-CM | POA: Diagnosis not present

## 2021-09-19 DIAGNOSIS — D751 Secondary polycythemia: Secondary | ICD-10-CM | POA: Diagnosis present

## 2021-09-19 DIAGNOSIS — R739 Hyperglycemia, unspecified: Secondary | ICD-10-CM | POA: Diagnosis present

## 2021-09-19 DIAGNOSIS — I10 Essential (primary) hypertension: Secondary | ICD-10-CM | POA: Diagnosis present

## 2021-09-19 DIAGNOSIS — E7800 Pure hypercholesterolemia, unspecified: Secondary | ICD-10-CM | POA: Diagnosis present

## 2021-09-19 DIAGNOSIS — J9601 Acute respiratory failure with hypoxia: Secondary | ICD-10-CM | POA: Diagnosis present

## 2021-09-19 DIAGNOSIS — Z79899 Other long term (current) drug therapy: Secondary | ICD-10-CM | POA: Diagnosis not present

## 2021-09-19 DIAGNOSIS — Z9851 Tubal ligation status: Secondary | ICD-10-CM | POA: Diagnosis not present

## 2021-09-19 DIAGNOSIS — D72829 Elevated white blood cell count, unspecified: Secondary | ICD-10-CM | POA: Diagnosis present

## 2021-09-19 LAB — CBC
HCT: 42.4 % (ref 36.0–46.0)
Hemoglobin: 14.5 g/dL (ref 12.0–15.0)
MCH: 32.3 pg (ref 26.0–34.0)
MCHC: 34.2 g/dL (ref 30.0–36.0)
MCV: 94.4 fL (ref 80.0–100.0)
Platelets: 352 10*3/uL (ref 150–400)
RBC: 4.49 MIL/uL (ref 3.87–5.11)
RDW: 14.7 % (ref 11.5–15.5)
WBC: 21.8 10*3/uL — ABNORMAL HIGH (ref 4.0–10.5)
nRBC: 0 % (ref 0.0–0.2)

## 2021-09-19 LAB — COMPREHENSIVE METABOLIC PANEL
ALT: 27 U/L (ref 0–44)
AST: 27 U/L (ref 15–41)
Albumin: 3.9 g/dL (ref 3.5–5.0)
Alkaline Phosphatase: 70 U/L (ref 38–126)
Anion gap: 6 (ref 5–15)
BUN: 38 mg/dL — ABNORMAL HIGH (ref 8–23)
CO2: 28 mmol/L (ref 22–32)
Calcium: 9.7 mg/dL (ref 8.9–10.3)
Chloride: 107 mmol/L (ref 98–111)
Creatinine, Ser: 0.82 mg/dL (ref 0.44–1.00)
GFR, Estimated: >60 mL/min (ref >60)
Glucose, Bld: 104 mg/dL — ABNORMAL HIGH (ref 70–99)
Potassium: 4.2 mmol/L (ref 3.5–5.1)
Sodium: 141 mmol/L (ref 135–145)
Total Bilirubin: 0.5 mg/dL (ref 0.3–1.2)
Total Protein: 7 g/dL (ref 6.5–8.1)

## 2021-09-19 LAB — GLUCOSE, CAPILLARY
Glucose-Capillary: 91 mg/dL (ref 70–99)
Glucose-Capillary: 117 mg/dL — ABNORMAL HIGH (ref 70–99)
Glucose-Capillary: 133 mg/dL — ABNORMAL HIGH (ref 70–99)
Glucose-Capillary: 95 mg/dL (ref 70–99)

## 2021-09-19 MED ORDER — SODIUM CHLORIDE 0.9 % IV SOLN
100.0000 mg | Freq: Two times a day (BID) | INTRAVENOUS | Status: DC
  Administered 2021-09-19 (×2): 100 mg via INTRAVENOUS
  Filled 2021-09-19 (×3): qty 100

## 2021-09-19 MED ORDER — LORAZEPAM 0.5 MG PO TABS
0.5000 mg | ORAL_TABLET | Freq: Four times a day (QID) | ORAL | Status: DC | PRN
  Administered 2021-09-19 – 2021-09-20 (×2): 0.5 mg via ORAL
  Filled 2021-09-19 (×2): qty 1

## 2021-09-19 MED ORDER — ALBUTEROL SULFATE (2.5 MG/3ML) 0.083% IN NEBU
2.5000 mg | INHALATION_SOLUTION | RESPIRATORY_TRACT | Status: DC
Start: 2021-09-19 — End: 2021-09-19

## 2021-09-19 MED ORDER — MELATONIN 3 MG PO TABS
3.0000 mg | ORAL_TABLET | Freq: Every day | ORAL | Status: DC
  Administered 2021-09-19 – 2021-09-20 (×2): 3 mg via ORAL
  Filled 2021-09-19 (×2): qty 1

## 2021-09-19 MED ORDER — ALBUTEROL SULFATE (2.5 MG/3ML) 0.083% IN NEBU
2.5000 mg | INHALATION_SOLUTION | RESPIRATORY_TRACT | Status: DC | PRN
Start: 2021-09-19 — End: 2021-09-22
  Administered 2021-09-20 – 2021-09-21 (×3): 2.5 mg via RESPIRATORY_TRACT
  Filled 2021-09-19 (×3): qty 3

## 2021-09-19 NOTE — TOC Initial Note (Signed)
Transition of Care (TOC) - Initial/Assessment Note  ? ? ?Patient Details  ?Name: Tasha [name] ?MRN: [id number] ?Date of Birth: 07-07-1950 ? ?Transition of Care (TOC) CM/SW Contact:    ?Tawanna Cooler, RN ?Phone Number: ?09/19/2021, 3:29 PM ? ?Clinical Narrative:                 ? ?Patient from home alone.  At last hospital d/c went to short term rehab at [facility].  Has a [place].  Has home O2.  Receives home palliative services through [facility]. ?TOC following for discharge needs.  ? ? ?Expected Discharge Plan: [place] ?Barriers to Discharge: Continued Medical Work up ? ? ?Expected Discharge Plan and Services ?Expected Discharge Plan: [place] ?  ?  ?  ?Living arrangements for the past 2 months: [place] ?                ?  ?Prior Living Arrangements/Services ?Living arrangements for the past 2 months: [place] ?Lives with:: Self ?Patient language and need for interpreter reviewed:: Yes ?       ?Need for Family Participation in Patient Care: Yes (Comment) ?Care giver support system in place?: Yes (comment) ?Current home services: DME (home O2) ?Criminal Activity/Legal Involvement Pertinent to Current Situation/Hospitalization: No - Comment as needed ? ?Activities of Daily Living ?Home Assistive Devices/Equipment: None ?ADL Screening (condition at time of admission) ?Patient's cognitive ability adequate to safely complete daily activities?: Yes ?Is the patient deaf or have difficulty hearing?: No ?Does the patient have difficulty seeing, even when wearing glasses/contacts?: No ?Does the patient have difficulty concentrating, remembering, or making decisions?: Yes ?Patient able to express need for assistance with ADLs?: Yes ?Does the patient have difficulty dressing or bathing?: No ?Independently performs ADLs?: Yes (appropriate for developmental age) ?Does the patient have difficulty walking or climbing stairs?: No ?Weakness of Legs:  None ?Weakness of Arms/Hands: None ? ?Emotional Assessment ?  ?Orientation: : Oriented to Self, Oriented to Place, Oriented to  Time, Oriented to Situation ?Alcohol / Substance Use: Not Applicable ?Psych Involvement: No (comment) ? ?Admission diagnosis:  Hypoxia [R09.02] ?COPD exacerbation ([place]) [J44.1] ?Acute respiratory failure with hypoxia and hypercapnia (HCC) [J96.01, J96.02] ?Acute respiratory failure with hypoxia ([place]) [J96.01] ?Patient Active Problem List  ? Diagnosis Date Noted  ? Acute respiratory failure with hypoxia ([place]) 09/19/2021  ? Acute respiratory failure with hypoxia and hypercapnia ([place]) 09/18/2021  ? Gastroesophageal reflux disease 09/18/2021  ? Family history of malignant neoplasm of digestive organs 09/18/2021  ? Periumbilical pain [id number]  ? Secondary polycythemia 09/18/2021  ? Prediabetes 07/24/2021  ? Adrenal nodule ([place]) 07/22/2021  ? COPD with acute exacerbation ([place]) 12/10/2017  ? Chronic respiratory failure with hypoxia and hypercapnia ([place]) 12/10/2017  ? Generalized anxiety disorder 12/09/2017  ? COPD, group C, by GOLD 2017 classification ([place]) 08/24/2016  ? Nut allergy 09/03/2015  ? Abnormal nuclear stress test 06/10/2015  ? Family history of colorectal cancer 05/27/2015  ? Tobacco abuse 05/27/2015  ? Alcoholism in recovery [facility]) 04/28/2015  ? Essential hypertension 04/24/2014  ? DVT (deep venous thrombosis) ([place]) 03/10/2012  ? 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 Pre-diabetes 02/10/2009  ? ENDOMETRIAL POLYP 10/01/2008  ? Hypercholesteremia 04/17/2008  ? Anxiety and depression 04/17/2008  ? ASTHMA 04/17/2008  ? SCOLIOSIS 04/17/2008  ? COLONIC POLYPS, HX OF 04/17/2008  ? ?PCP:  Darreld Mclean, MD ?Pharmacy:   ?[facility] # [place], [place] -  [address] ?[place] [address] ?Phone: [phone] Fax: [phone] ? ? ?

## 2021-09-19 NOTE — Progress Notes (Signed)
[facility] ?PROGRESS NOTE ? ? ? ?Progress Note  ?Tasha [name]  AJO:[id number] DOB: Oct 22, 1950 DOA: 09/18/2021 ?PCP: Pearline Cables, MD  ? ? ? ?Brief Narrative:  ? ?Tasha [name] is an 71 y.o. female past medical history significant for abnormal nuclear stress test depression osteoarthritis of the lumbar spine asthma history of DVT not on anticoagulation, essential hypertension comes into the ED for shortness of breath that started the day prior to admission afebrile in the ED pulse of 90 breathing about 36 times per minute white count of 17 hemoglobin of 16 ABG showed pH of 7.3 4/52/75, SARS-CoV-2 and influenza PCR were negative, physical exam poor air movement with wheezing bilaterally ? ?Assessment/Plan:  ? ?Acute respiratory failure with hypoxia and hypercapnia secondary to COPD exacerbation: ?Started on supplemental oxygen, continue BiPAP as needed. ?Continue scheduled inhalers every 4 and butyryl as needed as needed. ?Started empirically on steroids, we will go ahead and start him on IV steroids. ?Out of bed to chair, encourage incentive spirometry and ambulation. ?Try to wean to room air.  Still requiring 4 L of oxygen to keep saturations greater 90%. ?She is not able to speak in full sentences at time.  With moderate air movement minimal wheezing. ? ?Hyperglycemia/prediabetes mellitus: ?With an A1c of 6.0. ?As she will be on steroids it will make her blood glucose erratic she will start on sliding scale insulin continue carb modified diet. ? ?Essential hypertension: ?Has remained stable continue diuretic therapy. ? ?Hyperlipidemia: ?Continue statins. ? ?Anxiety and depression: ?Continue citalopram, buspirone follow-up with psychiatry as an outpatient. ? ?Tobacco abuse: ?She states she quit 2 days ago. ?Declined getting patch. ? ?Secondary polycythemia: ?Secondary to smoking she has been counseled. ? ?Leukocytosis: ?Likely reactive due to steroids continue to monitor fever curve. ?Tmax  of 98.7. ? ? ? ?DVT prophylaxis: lovenox ?Family Communication:none ?Status is: Observation ?The patient will require care spanning > 2 midnights and should be moved to inpatient because: Acute respiratory failure with hypoxia due to COPD exacerbation. ? ? ? ?Code Status:  ? ?  ?Code Status Orders  ?(From admission, onward)  ?  ? ? ?  ? ?  Start     Ordered  ? 09/18/21 1501  Do not attempt resuscitation (DNR)  Continuous       ?Question Answer Comment  ?In the event of cardiac or respiratory ARREST Do not call a ?code blue?   ?In the event of cardiac or respiratory ARREST Do not perform Intubation, CPR, defibrillation or ACLS   ?In the event of cardiac or respiratory ARREST Use medication by any route, position, wound care, and other measures to relive pain and suffering. May use oxygen, suction and manual treatment of airway obstruction as needed for comfort.   ?  ? 09/18/21 1500  ? ?  ?  ? ?  ? ?Code Status History   ? ? Date Active Date Inactive Code Status Order ID Comments User Context  ? 09/18/2021 0843 09/18/2021 1500 Full Code [id number]  Bobette Mo, MD ED  ? 07/14/2021 1846 07/27/2021 1902 Full Code [id number]  Synetta Fail, MD ED  ? 12/11/2017 0018 12/12/2017 1827 Full Code [id number]  Therisa Doyne, MD Inpatient  ? ?  ? ? ? ? ?IV Access:  ? ?Peripheral IV ? ? ?Procedures and diagnostic studies:  ? ?DG Chest Port 1 View ? ?Result Date: 09/18/2021 ?CLINICAL DATA:  71 year old female with history of shortness of breath. EXAM: PORTABLE CHEST 1 VIEW COMPARISON:  Chest x-ray 07/14/2021. FINDINGS: Lung volumes are normal. No consolidative airspace disease. No pleural effusions. No pneumothorax. No pulmonary nodule or mass noted. Pulmonary vasculature and the cardiomediastinal silhouette are within normal limits. IMPRESSION: No radiographic evidence of acute cardiopulmonary disease. Electronically Signed   By: Trudie Reedaniel  Entrikin M.D.   On: 09/18/2021 07:22   ? ? ?Medical Consultants:   ? ?None. ? ? ?Subjective:  ? ? ?Tasha [name] relates her breathing is significantly better than yesterday. ? ?Objective:  ? ? ?Vitals:  ? 09/19/21 0000 09/19/21 0002 09/19/21 0042 09/19/21 0400  ?BP:  (!) 118/50  (!) 144/58  ?Pulse: 90 88  80  ?Resp: (!) 24 (!) 24  (!) 35  ?Temp:   98.3 ?F (36.8 ?C)   ?TempSrc:   Axillary   ?SpO2: 94% 93%  90%  ?Weight:      ?Height:      ? ?SpO2: 90 % ?O2 Flow Rate (L/min): 4 L/min ?FiO2 (%): (!) 3 % ? ? ?Intake/Output Summary (Last 24 hours) at 09/19/2021 0719 ?Last data filed at 09/18/2021 2009 ?Gross per 24 hour  ?Intake 200 ml  ?Output 260 ml  ?Net -60 ml  ? ?Filed Weights  ? 09/18/21 1229  ?Weight: 58.1 kg  ? ? ?Exam: ?General exam: In no acute distress. ?Respiratory system: Moderate air movement with wheezing bilaterally ?Cardiovascular system: S1 & S2 heard, RRR. No JVD. ?Gastrointestinal system: Abdomen is nondistended, soft and nontender.  ?Extremities: No pedal edema. ?Skin: No rashes, lesions or ulcers ?Psychiatry: Judgement and insight appear normal. Mood & affect appropriate.  ? ? ?Data Reviewed:  ? ? ?Labs: ?Basic Metabolic Panel: ?Recent Labs  ?Lab 09/18/21 ?0750 09/19/21 ?0243  ?NA 141 141  ?K 3.8 4.2  ?CL 106 107  ?CO2 26 28  ?GLUCOSE 180* 104*  ?BUN 24* 38*  ?CREATININE 0.72 0.82  ?CALCIUM 9.9 9.7  ? ?GFR ?Estimated Creatinine Clearance: 50.1 mL/min (by C-G formula based on SCr of 0.82 mg/dL). ?Liver Function Tests: ?Recent Labs  ?Lab 09/19/21 ?0243  ?AST 27  ?ALT 27  ?ALKPHOS 70  ?BILITOT 0.5  ?PROT 7.0  ?ALBUMIN 3.9  ? ?No results for input(s): LIPASE, AMYLASE in the last 168 hours. ?No results for input(s): AMMONIA in the last 168 hours. ?Coagulation profile ?No results for input(s): INR, PROTIME in the last 168 hours. ?COVID-19 Labs ? ?No results for input(s): DDIMER, FERRITIN, LDH, CRP in the last 72 hours. ? ?Lab Results  ?Component Value Date  ? SARSCOV2NAA NEGATIVE 09/18/2021  ? SARSCOV2NAA NEGATIVE 07/14/2021  ? ? ?CBC: ?Recent Labs  ?Lab  09/18/21 ?0750 09/19/21 ?0243  ?WBC 17.1* 21.8*  ?NEUTROABS 14.8*  --   ?HGB 15.8* 14.5  ?HCT 47.9* 42.4  ?MCV 96.6 94.4  ?PLT 332 352  ? ?Cardiac Enzymes: ?No results for input(s): CKTOTAL, CKMB, CKMBINDEX, TROPONINI in the last 168 hours. ?BNP (last 3 results) ?No results for input(s): PROBNP in the last 8760 hours. ?CBG: ?Recent Labs  ?Lab 09/18/21 ?1229 09/18/21 ?1440 09/18/21 ?1655 09/18/21 ?2121  ?GLUCAP 221* 260* 132* 107*  ? ?D-Dimer: ?No results for input(s): DDIMER in the last 72 hours. ?Hgb A1c: ?No results for input(s): HGBA1C in the last 72 hours. ?Lipid Profile: ?No results for input(s): CHOL, HDL, LDLCALC, TRIG, CHOLHDL, LDLDIRECT in the last 72 hours. ?Thyroid function studies: ?No results for input(s): TSH, T4TOTAL, T3FREE, THYROIDAB in the last 72 hours. ? ?Invalid input(s): FREET3 ?Anemia work up: ?No results for input(s): VITAMINB12, FOLATE, FERRITIN, TIBC, IRON, RETICCTPCT in  the last 72 hours. ?Sepsis Labs: ?Recent Labs  ?Lab 09/18/21 ?0750 09/19/21 ?0243  ?WBC 17.1* 21.8*  ? ?Microbiology ?Recent Results (from the past 240 hour(s))  ?Resp Panel by RT-PCR (Flu A&B, Covid) Nasopharyngeal Swab     Status: None  ? Collection Time: 09/18/21  8:42 AM  ? Specimen: Nasopharyngeal Swab; Nasopharyngeal(NP) swabs in vial transport medium  ?Result Value Ref Range Status  ? SARS Coronavirus 2 by RT PCR NEGATIVE NEGATIVE Final  ?  Comment: (NOTE) ?SARS-CoV-2 target nucleic acids are NOT DETECTED. ? ?The SARS-CoV-2 RNA is generally detectable in upper respiratory ?specimens during the acute phase of infection. The lowest ?concentration of SARS-CoV-2 viral copies this assay can detect is ?138 copies/mL. A negative result does not preclude SARS-Cov-2 ?infection and should not be used as the sole basis for treatment or ?other patient management decisions. A negative result may occur with  ?improper specimen collection/handling, submission of specimen other ?than nasopharyngeal swab, presence of viral  mutation(s) within the ?areas targeted by this assay, and inadequate number of viral ?copies(<138 copies/mL). A negative result must be combined with ?clinical observations, patient history, and epidemiological ?information. The expected

## 2021-09-20 DIAGNOSIS — J9601 Acute respiratory failure with hypoxia: Secondary | ICD-10-CM | POA: Diagnosis not present

## 2021-09-20 DIAGNOSIS — I10 Essential (primary) hypertension: Secondary | ICD-10-CM | POA: Diagnosis not present

## 2021-09-20 DIAGNOSIS — D751 Secondary polycythemia: Secondary | ICD-10-CM | POA: Diagnosis not present

## 2021-09-20 DIAGNOSIS — J441 Chronic obstructive pulmonary disease with (acute) exacerbation: Secondary | ICD-10-CM | POA: Diagnosis not present

## 2021-09-20 LAB — GLUCOSE, CAPILLARY
Glucose-Capillary: 85 mg/dL (ref 70–99)
Glucose-Capillary: 141 mg/dL — ABNORMAL HIGH (ref 70–99)
Glucose-Capillary: 164 mg/dL — ABNORMAL HIGH (ref 70–99)
Glucose-Capillary: 96 mg/dL (ref 70–99)

## 2021-09-20 MED ORDER — IPRATROPIUM-ALBUTEROL 0.5-2.5 (3) MG/3ML IN SOLN
3.0000 mL | Freq: Three times a day (TID) | RESPIRATORY_TRACT | Status: DC
  Administered 2021-09-20 – 2021-09-21 (×4): 3 mL via RESPIRATORY_TRACT
  Filled 2021-09-20 (×5): qty 3

## 2021-09-20 NOTE — Evaluation (Signed)
Physical Therapy Evaluation ?Patient Details ?Name: Tasha [name] ?MRN: [id number] ?DOB: January 14, 1951 ?Today's Date: 09/20/2021 ? ?History of Present Illness ? Tasha [name] is an 71 y.o. female past medical history significant for abnormal nuclear stress test depression osteoarthritis of the lumbar spine asthma history of DVT not on anticoagulation, essential hypertension comes into the ED for shortness of breath. Tasha [name] admitted for COPD exacerbation.  ?Clinical Impression ? Pt admitted as above and presenting with functional mobility limitations 2* decreased endurance and mild ambulatory balance deficits.  Pt motivated and should progress well to dc home to previous living arrangement. ?   ? ?Recommendations for follow up therapy are one component of a multi-disciplinary discharge planning process, led by the attending physician.  Recommendations may be updated based on patient status, additional functional criteria and insurance authorization. ? ?Follow Up Recommendations No PT follow up ? ?  ?Assistance Recommended at Discharge PRN  ?Patient can return home with the following ?   ? ?  ?Equipment Recommendations None recommended by PT  ?Recommendations for Other Services ?    ?  ?Functional Status Assessment Patient has had a recent decline in their functional status and demonstrates the ability to make significant improvements in function in a reasonable and predictable amount of time.  ? ?  ?Precautions / Restrictions Precautions ?Precautions: Other (comment) ?Precaution Comments: monitor o2 sat ?Restrictions ?Weight Bearing Restrictions: No  ? ?  ? ?Mobility ? Bed Mobility ?Overal bed mobility: Independent ?  ?  ?  ?  ?  ?  ?  ?  ? ?Transfers ?Overall transfer level: Needs assistance ?Equipment used: None ?Transfers: Sit to/from Stand ?Sit to Stand: Min guard, Supervision ?  ?  ?  ?  ?  ?General transfer comment: Steady assist only to rise from bedside ?  ? ?Ambulation/Gait ?Ambulation/Gait  assistance: Min guard ?Gait Distance (Feet): 150 Feet (and 15' back from bathroom) ?Assistive device: None ?Gait Pattern/deviations: Step-through pattern, Decreased step length - right, Decreased step length - left, Shuffle ?  ?  ?  ?General Gait Details: mild instability but no OVERT LOB - stability improved with increased distance ambulated ? ?Stairs ?  ?  ?  ?  ?  ? ?Wheelchair Mobility ?  ? ?Modified Rankin (Stroke Patients Only) ?  ? ?  ? ?Balance Overall balance assessment: No apparent balance deficits (not formally assessed) ?  ?  ?  ?  ?  ?  ?  ?  ?  ?  ?  ?  ?  ?  ?  ?  ?  ?  ?   ? ? ? ?Pertinent Vitals/Pain Pain Assessment ?Pain Assessment: No/denies pain  ? ? ?Home Living Family/patient expects to be discharged to:: Private residence ?Living Arrangements: Alone ?Available Help at Discharge: Friend(s);Available PRN/intermittently ?Type of Home: House ?  ?  ?  ?  ?Home Layout: One level ?Home Equipment: None ?   ?  ?Prior Function Prior Level of Function : Independent/Modified Independent ?  ?  ?  ?  ?  ?  ?Mobility Comments: no device, ?ADLs Comments: Independent, drives to grocery store ?  ? ? ?Hand Dominance  ? Dominant Hand: Right ? ?  ?Extremity/Trunk Assessment  ? Upper Extremity Assessment ?Upper Extremity Assessment: Overall WFL for tasks assessed ?  ? ?Lower Extremity Assessment ?Lower Extremity Assessment: Overall WFL for tasks assessed ?  ? ?Cervical / Trunk Assessment ?Cervical / Trunk Assessment: Normal  ?Communication  ? Communication: No difficulties  ?Cognition  Arousal/Alertness: Awake/alert ?Behavior During Therapy: [facility] for tasks assessed/performed ?Overall Cognitive Status: Within Functional Limits for tasks assessed ?  ?  ?  ?  ?  ?  ?  ?  ?  ?  ?  ?  ?  ?  ?  ?  ?General Comments: Alert and oriented x 3. Cogntion is functional but does have some short term memory deficits. ?  ?  ? ?  ?General Comments   ? ?  ?Exercises    ? ?Assessment/Plan  ?  ?PT Assessment Patient needs continued PT  services  ?PT Problem List Decreased activity tolerance;Decreased balance;Decreased mobility ? ?   ?  ?PT Treatment Interventions DME instruction;Gait training;Stair training;Functional mobility training;Therapeutic activities;Therapeutic exercise;Balance training;Patient/family education   ? ?PT Goals (Current goals can be found in the Care Plan section)  ?Acute Rehab PT Goals ?Patient Stated Goal: Resume previous lifestyle ?PT Goal Formulation: With patient ?Time For Goal Achievement: 10/11/21 ?Potential to Achieve Goals: Good ? ?  ?Frequency Min 3X/week ?  ? ? ?Co-evaluation   ?  ?  ?  ?  ? ? ?  ?AM-PAC PT "6 Clicks" Mobility  ?Outcome Measure Help needed turning from your back to your side while in a flat bed without using bedrails?: None ?Help needed moving from lying on your back to sitting on the side of a flat bed without using bedrails?: None ?Help needed moving to and from a bed to a chair (including a wheelchair)?: None ?Help needed standing up from a chair using your arms (e.g., wheelchair or bedside chair)?: A Little ?Help needed to walk in hospital room?: A Little ?Help needed climbing 3-5 steps with a railing? : A Little ?6 Click Score: 21 ? ?  ?End of Session Equipment Utilized During Treatment: Gait belt;Oxygen ?Activity Tolerance: Patient tolerated treatment well ?Patient left: in chair;with call bell/phone within reach;with chair alarm set ?Nurse Communication: Mobility status ?PT Visit Diagnosis: Unsteadiness on feet (R26.81) ?  ? ?Time: [id number] ?PT Time Calculation (min) (ACUTE ONLY): 19 min ? ? ?Charges:   PT Evaluation ?$PT Eval Low Complexity: 1 Low ?  ?  ?   ? ? ?Debe Coder PT ?Acute Rehabilitation Services ?Pager [phone] ?Office [phone] ? ? ?Tasha [name] ?09/20/2021, 12:27 PM ? ?

## 2021-09-20 NOTE — Progress Notes (Signed)
Patient arrived on the unit from ICU patient alert and oriented x 4. Vital signs within normal limits. ?

## 2021-09-20 NOTE — Evaluation (Signed)
Occupational Therapy Evaluation ?Patient Details ?Name: Tasha [name] ?MRN: [id number] ?DOB: 10-23-1950 ?Today's Date: 09/20/2021 ? ? ?History of Present Illness [name] is an 71 y.o. female past medical history significant for abnormal nuclear stress test depression osteoarthritis of the lumbar spine asthma history of DVT not on anticoagulation, essential hypertension comes into the ED for shortness of breath. Patietn admitted for COPD exacerbation.  ? ?Clinical Impression ?  ?[name] is a 71 year old woman who demonstrates ability to perform bed tranfers, ambulation and ADLs. She is found on 3 L [place] at 89-90% and ambulated on 4 L [place]. From a functional standpoint patient is at her baseline just requiring increased oxygen. Patient is familiar to therapist from prior admission. She does have short term memory deficits but was alert to place, month and year and would benefit from PRN supervision - which she may have from her friend. I know she does not have any familial support. However, she has no current OT needs.  ?   ? ?Recommendations for follow up therapy are one component of a multi-disciplinary discharge planning process, led by the attending physician.  Recommendations may be updated based on patient status, additional functional criteria and insurance authorization.  ? ?Follow Up Recommendations ? No OT follow up  ?  ?Assistance Recommended at Discharge None  ?Patient can return home with the following   ? ?  ?Functional Status Assessment ? Patient has not had a recent decline in their functional status  ?Equipment Recommendations ? None recommended by OT  ?  ?Recommendations for Other Services   ? ? ?  ?Precautions / Restrictions Precautions ?Precautions: Other (comment) ?Precaution Comments: monitor o2 sat ?Restrictions ?Weight Bearing Restrictions: No  ? ?  ? ?Mobility Bed Mobility ?Overal bed mobility: Independent ?  ?  ?  ?  ?  ?  ?  ?  ? ?Transfers ?Overall transfer level:  Needs assistance ?Equipment used: None ?  ?  ?  ?  ?  ?  ?  ?General transfer comment: Ambulated in hall with a device on 4 L [place] - no apparent balance deficits ?  ? ?  ?Balance Overall balance assessment: No apparent balance deficits (not formally assessed) ?  ?  ?  ?  ?  ?  ?  ?  ?  ?  ?  ?  ?  ?  ?  ?  ?  ?  ?   ? ?ADL either performed or assessed with clinical judgement  ? ?ADL Overall ADL's : Independent ?  ?  ?  ?  ?  ?  ?  ?  ?  ?  ?  ?  ?  ?  ?  ?  ?  ?  ?  ?   ? ? ? ?Vision Patient Visual Report: No change from baseline ?   ?   ?Perception   ?  ?Praxis   ?  ? ?Pertinent Vitals/Pain Pain Assessment ?Pain Assessment: No/denies pain  ? ? ? ?Hand Dominance Right ?  ?Extremity/Trunk Assessment Upper Extremity Assessment ?Upper Extremity Assessment: Overall WFL for tasks assessed ?  ?Lower Extremity Assessment ?Lower Extremity Assessment: Defer to PT evaluation ?  ?Cervical / Trunk Assessment ?Cervical / Trunk Assessment: Normal ?  ?Communication Communication ?Communication: No difficulties ?  ?Cognition Arousal/Alertness: Awake/alert ?Behavior During Therapy: [facility] for tasks assessed/performed ?Overall Cognitive Status: Within Functional Limits for tasks assessed ?  ?  ?  ?  ?  ?  ?  ?  ?  ?  ?  ?  ?  ?  ?  ?  ?  General Comments: Alert and oriented x 3. Cogntion is functional but does have some short term memory deficits. ?  ?  ?General Comments    ? ?  ?Exercises   ?  ?Shoulder Instructions    ? ? ?Home Living Family/patient expects to be discharged to:: Private residence ?Living Arrangements: Alone ?Available Help at Discharge: Friend(s);Available PRN/intermittently ?Type of Home: House ?  ?  ?  ?Home Layout: One level ?  ?  ?Bathroom Shower/Tub: Tub/shower unit ?  ?Bathroom Toilet: Standard ?  ?  ?Home Equipment: None ?  ?  ?  ? ?  ?Prior Functioning/Environment Prior Level of Function : Independent/Modified Independent ?  ?  ?  ?  ?  ?  ?Mobility Comments: no device, ?ADLs Comments: Independent, drives to  grocery store ?  ? ?  ?  ?OT Problem List: Cardiopulmonary status limiting activity ?  ?   ?OT Treatment/Interventions:    ?  ?OT Goals(Current goals can be found in the care plan section) Acute Rehab OT Goals ?OT Goal Formulation: All assessment and education complete, DC therapy  ?OT Frequency:   ?  ? ?Co-evaluation   ?  ?  ?  ?  ? ?  ?AM-PAC OT "6 Clicks" Daily Activity     ?Outcome Measure Help from another person eating meals?: None ?Help from another person taking care of personal grooming?: None ?Help from another person toileting, which includes using toliet, bedpan, or urinal?: None ?Help from another person bathing (including washing, rinsing, drying)?: None ?Help from another person to put on and taking off regular upper body clothing?: None ?Help from another person to put on and taking off regular lower body clothing?: None ?6 Click Score: 24 ?  ?End of Session Equipment Utilized During Treatment: Rolling walker (2 wheels);Oxygen;Gait belt ?Nurse Communication: Mobility status ? ?Activity Tolerance: Patient tolerated treatment well ?Patient left: in chair;with call bell/phone within reach ? ?OT Visit Diagnosis: Muscle weakness (generalized) (M62.81)  ?              ?Time: 7096-2836 ?OT Time Calculation (min): 17 min ?Charges:  OT General Charges ?$OT Visit: 1 Visit ?OT Evaluation ?$OT Eval Low Complexity: 1 Low ? ?Lezlee Gills, OTR/L ?Acute Care Rehab Services  ?Office [phone] ?Pager: [phone]  ? ?Shandi Godfrey L Eleaner Dibartolo ?09/20/2021, 10:07 AM ?

## 2021-09-20 NOTE — Progress Notes (Signed)
[facility] ?PROGRESS NOTE ? ? ? ?Progress Note  ?Tasha [name]  NAT:[id number] DOB: Aug 05, 1950 DOA: 09/18/2021 ?PCP: Pearline Cables, MD  ? ? ? ?Brief Narrative:  ? ?Tasha [name] is an 71 y.o. female past medical history significant for abnormal nuclear stress test depression osteoarthritis of the lumbar spine asthma history of DVT not on anticoagulation, essential hypertension comes into the ED for shortness of breath that started the day prior to admission afebrile in the ED pulse of 90 breathing about 36 times per minute white count of 17 hemoglobin of 16 ABG showed pH of 7.3 4/52/75, SARS-CoV-2 and influenza PCR were negative, physical exam poor air movement with wheezing bilaterally ? ?Assessment/Plan:  ? ?Acute respiratory failure with hypoxia and hypercapnia secondary to COPD exacerbation: ?With BiPAP overnight probably has undiagnosed obstructive sleep apnea. ?Still requiring 2 L of oxygen to keep saturations greater than 86%. ?Continue scheduled inhalers every 4 as needed. ?Continue steroids and antibiotic orally. ?Out of bed to chair, encourage incentive spirometry and ambulation. ? ?Hyperglycemia/prediabetes mellitus: ?With an A1c of 6.0. ?Blood glucose well controlled continue current regimen. ? ?Essential hypertension: ?Has remained stable continue diuretic therapy. ? ?Hyperlipidemia: ?Continue statins. ? ?Anxiety and depression: ?Continue citalopram, buspirone follow-up with psychiatry as an outpatient. ? ?Tobacco abuse: ?She states she quit 2 days ago. ?Declined getting patch. ? ?Secondary polycythemia: ?Secondary to smoking she has been counseled. ? ?Leukocytosis: ?Likely reactive due to steroids continue to monitor fever curve. ?Tmax of 98.7. ? ? ? ?DVT prophylaxis: lovenox ?Family Communication:none ?Status is: Observation ?The patient will require care spanning > 2 midnights and should be moved to inpatient because: Acute respiratory failure with hypoxia due to COPD  exacerbation. ? ? ? ?Code Status:  ? ?  ?Code Status Orders  ?(From admission, onward)  ?  ? ? ?  ? ?  Start     Ordered  ? 09/18/21 1501  Do not attempt resuscitation (DNR)  Continuous       ?Question Answer Comment  ?In the event of cardiac or respiratory ARREST Do not call a ?code blue?   ?In the event of cardiac or respiratory ARREST Do not perform Intubation, CPR, defibrillation or ACLS   ?In the event of cardiac or respiratory ARREST Use medication by any route, position, wound care, and other measures to relive pain and suffering. May use oxygen, suction and manual treatment of airway obstruction as needed for comfort.   ?  ? 09/18/21 1500  ? ?  ?  ? ?  ? ?Code Status History   ? ? Date Active Date Inactive Code Status Order ID Comments User Context  ? 09/18/2021 0843 09/18/2021 1500 Full Code [id number]  Bobette Mo, MD ED  ? 07/14/2021 1846 07/27/2021 1902 Full Code [id number]  Synetta Fail, MD ED  ? 12/11/2017 0018 12/12/2017 1827 Full Code [id number]  Therisa Doyne, MD Inpatient  ? ?  ? ? ? ? ?IV Access:  ? ?Peripheral IV ? ? ?Procedures and diagnostic studies:  ? ?DG Chest Port 1 View ? ?Result Date: 09/18/2021 ?CLINICAL DATA:  71 year old female with history of shortness of breath. EXAM: PORTABLE CHEST 1 VIEW COMPARISON:  Chest x-ray 07/14/2021. FINDINGS: Lung volumes are normal. No consolidative airspace disease. No pleural effusions. No pneumothorax. No pulmonary nodule or mass noted. Pulmonary vasculature and the cardiomediastinal silhouette are within normal limits. IMPRESSION: No radiographic evidence of acute cardiopulmonary disease. Electronically Signed   By: Trudie Reed M.D.   On:  09/18/2021 07:22   ? ? ?Medical Consultants:  ? ?None. ? ? ?Subjective:  ? ? ?Tasha NovemberJanet Paisley [name] relates her breathing is better. ? ?Objective:  ? ? ?Vitals:  ? 09/19/21 2010 09/19/21 2051 09/19/21 2329 09/20/21 0435  ?BP:      ?Pulse:      ?Resp:      ?Temp: 98.3 ?F (36.8 ?C)  98.9 ?F (37.2 ?C) 98.1  ?F (36.7 ?C)  ?TempSrc: Axillary  Oral Oral  ?SpO2:  91%    ?Weight:      ?Height:      ? ?SpO2: 91 % ?O2 Flow Rate (L/min): 3 L/min ?FiO2 (%): 30 % ? ? ?Intake/Output Summary (Last 24 hours) at 09/20/2021 0708 ?Last data filed at 09/20/2021 0435 ?Gross per 24 hour  ?Intake 4.3 ml  ?Output 550 ml  ?Net -545.7 ml  ? ? ?Filed Weights  ? 09/18/21 1229  ?Weight: 58.1 kg  ? ? ?Exam: ?General exam: In no acute distress, able to speak in full sentences ?Respiratory system: Good air movement and clear to auscultation. ?Cardiovascular system: S1 & S2 heard, RRR. No JVD. ?Gastrointestinal system: Abdomen is nondistended, soft and nontender.  ?Extremities: No pedal edema. ?Skin: No rashes, lesions or ulcers ?Psychiatry: Judgement and insight appear normal. Mood & affect appropriate. ? ? ?Data Reviewed:  ? ? ?Labs: ?Basic Metabolic Panel: ?Recent Labs  ?Lab 09/18/21 ?0750 09/19/21 ?0243  ?NA 141 141  ?K 3.8 4.2  ?CL 106 107  ?CO2 26 28  ?GLUCOSE 180* 104*  ?BUN 24* 38*  ?CREATININE 0.72 0.82  ?CALCIUM 9.9 9.7  ? ? ?GFR ?Estimated Creatinine Clearance: 50.1 mL/min (by C-G formula based on SCr of 0.82 mg/dL). ?Liver Function Tests: ?Recent Labs  ?Lab 09/19/21 ?0243  ?AST 27  ?ALT 27  ?ALKPHOS 70  ?BILITOT 0.5  ?PROT 7.0  ?ALBUMIN 3.9  ? ? ?No results for input(s): LIPASE, AMYLASE in the last 168 hours. ?No results for input(s): AMMONIA in the last 168 hours. ?Coagulation profile ?No results for input(s): INR, PROTIME in the last 168 hours. ?COVID-19 Labs ? ?No results for input(s): DDIMER, FERRITIN, LDH, CRP in the last 72 hours. ? ?Lab Results  ?Component Value Date  ? SARSCOV2NAA NEGATIVE 09/18/2021  ? SARSCOV2NAA NEGATIVE 07/14/2021  ? ? ?CBC: ?Recent Labs  ?Lab 09/18/21 ?0750 09/19/21 ?0243  ?WBC 17.1* 21.8*  ?NEUTROABS 14.8*  --   ?HGB 15.8* 14.5  ?HCT 47.9* 42.4  ?MCV 96.6 94.4  ?PLT 332 352  ? ? ?Cardiac Enzymes: ?No results for input(s): CKTOTAL, CKMB, CKMBINDEX, TROPONINI in the last 168 hours. ?BNP (last 3 results) ?No  results for input(s): PROBNP in the last 8760 hours. ?CBG: ?Recent Labs  ?Lab 09/18/21 ?2121 09/19/21 ?0741 09/19/21 ?1204 09/19/21 ?1628 09/19/21 ?2047  ?GLUCAP 107* 91 117* 133* 95  ? ? ?D-Dimer: ?No results for input(s): DDIMER in the last 72 hours. ?Hgb A1c: ?No results for input(s): HGBA1C in the last 72 hours. ?Lipid Profile: ?No results for input(s): CHOL, HDL, LDLCALC, TRIG, CHOLHDL, LDLDIRECT in the last 72 hours. ?Thyroid function studies: ?No results for input(s): TSH, T4TOTAL, T3FREE, THYROIDAB in the last 72 hours. ? ?Invalid input(s): FREET3 ?Anemia work up: ?No results for input(s): VITAMINB12, FOLATE, FERRITIN, TIBC, IRON, RETICCTPCT in the last 72 hours. ?Sepsis Labs: ?Recent Labs  ?Lab 09/18/21 ?0750 09/19/21 ?0243  ?WBC 17.1* 21.8*  ? ? ?Microbiology ?Recent Results (from the past 240 hour(s))  ?Resp Panel by RT-PCR (Flu A&B, Covid) Nasopharyngeal Swab  Status: None  ? Collection Time: 09/18/21  8:42 AM  ? Specimen: Nasopharyngeal Swab; Nasopharyngeal(NP) swabs in vial transport medium  ?Result Value Ref Range Status  ? SARS Coronavirus 2 by RT PCR NEGATIVE NEGATIVE Final  ?  Comment: (NOTE) ?SARS-CoV-2 target nucleic acids are NOT DETECTED. ? ?The SARS-CoV-2 RNA is generally detectable in upper respiratory ?specimens during the acute phase of infection. The lowest ?concentration of SARS-CoV-2 viral copies this assay can detect is ?138 copies/mL. A negative result does not preclude SARS-Cov-2 ?infection and should not be used as the sole basis for treatment or ?other patient management decisions. A negative result may occur with  ?improper specimen collection/handling, submission of specimen other ?than nasopharyngeal swab, presence of viral mutation(s) within the ?areas targeted by this assay, and inadequate number of viral ?copies(<138 copies/mL). A negative result must be combined with ?clinical observations, patient history, and epidemiological ?information. The expected result is  Negative. ? ?Fact Sheet for Patients:  ?BloggerCourse.com ? ?Fact Sheet for Healthcare Providers:  ?SeriousBroker.it ? ?This test is no t yet approved or cleared by the

## 2021-09-21 DIAGNOSIS — J9602 Acute respiratory failure with hypercapnia: Secondary | ICD-10-CM | POA: Diagnosis not present

## 2021-09-21 DIAGNOSIS — J9601 Acute respiratory failure with hypoxia: Secondary | ICD-10-CM | POA: Diagnosis not present

## 2021-09-21 LAB — GLUCOSE, CAPILLARY
Glucose-Capillary: 85 mg/dL (ref 70–99)
Glucose-Capillary: 129 mg/dL — ABNORMAL HIGH (ref 70–99)

## 2021-09-21 MED ORDER — IPRATROPIUM-ALBUTEROL 0.5-2.5 (3) MG/3ML IN SOLN: RESPIRATORY_TRACT | 0 refills | Status: AC

## 2021-09-21 MED ORDER — BUSPIRONE HCL 15 MG PO TABS: 15.0000 mg | ORAL_TABLET | Freq: Two times a day (BID) | ORAL | 3 refills | Status: AC

## 2021-09-21 MED ORDER — PREDNISONE 10 MG PO TABS
ORAL_TABLET | ORAL | 0 refills | Status: AC
Start: 2021-09-21 — End: ?

## 2021-09-21 MED ORDER — ALBUTEROL SULFATE (2.5 MG/3ML) 0.083% IN NEBU: 2.5000 mg | INHALATION_SOLUTION | RESPIRATORY_TRACT | 2 refills | Status: AC | PRN

## 2021-09-21 NOTE — TOC Transition Note (Signed)
Transition of Care (TOC) - CM/SW Discharge Note ? ? ?Patient Details  ?Name: Tasha [name] ?MRN: [id number] ?Date of Birth: 20-Oct-1950 ? ?Transition of Care (TOC) CM/SW Contact:  ?Darleene Cleaver, LCSW ?Phone Number: ?09/21/2021, 11:56 AM ? ? ?Clinical Narrative:    ? ?CSW spoke to patient to discuss home health services.  CSW asked patient if she has ever had home health, she said no she hasn't.  CSW explained to her what to expect from home health agency.  CSW asked if she had a preference for an agency and she said no.  CSW contacted [facility] and spoke to [place], they said they can accept her.  CSW updated patient of the name of the agency.  CSW asked if she needed any equipment, and she said she did not.  Patient will be discharging back home with home health services.  CSW signing off, please reconsult if social work needs arise. ? ?Final next level of care: Home w Home Health Services ?Barriers to Discharge: Barriers Resolved ? ? ?Patient Goals and CMS Choice ?Patient states their goals for this hospitalization and ongoing recovery are:: To return back home with home health. ?CMS Medicare.gov Compare Post Acute Care list provided to:: Patient ?Choice offered to / list presented to : Patient ? ?Discharge Placement ?  ?           ?  ?  ?  ?  ? ?Discharge Plan and Services ?  ?  ?           ?  ?  ?  ?  ?  ?HH Arranged: PT, RN ?HH Agency: [facility] ?Date HH Agency Contacted: 09/21/21 ?Time HH Agency Contacted: 1155 ?Representative spoke with at [facility] Agency: Becky Sax ? ?Social Determinants of Health (SDOH) Interventions ?  ? ? ?Readmission Risk Interventions ?   ? View : No data to display.  ?  ?  ?  ? ? ? ? ? ?

## 2021-09-21 NOTE — Progress Notes (Signed)
Patient is discharged to home. AVS and education was given to patient and family member at bedside. Patient was escorted down to private vehicle by NT.  ?

## 2021-09-21 NOTE — Plan of Care (Signed)
?  Problem: Education: ?Goal: Knowledge of disease or condition will improve ?Outcome: Completed/Met ?Goal: Knowledge of the prescribed therapeutic regimen will improve ?Outcome: Completed/Met ?Goal: Individualized Educational Video(s) ?Outcome: Completed/Met ?  ?Problem: Activity: ?Goal: Ability to tolerate increased activity will improve ?Outcome: Completed/Met ?Goal: Will verbalize the importance of balancing activity with adequate rest periods ?Outcome: Completed/Met ?  ?Problem: Respiratory: ?Goal: Ability to maintain a clear airway will improve ?Outcome: Completed/Met ?Goal: Levels of oxygenation will improve ?Outcome: Completed/Met ?Goal: Ability to maintain adequate ventilation will improve ?Outcome: Completed/Met ?  ?Problem: Acute Rehab PT Goals(only PT should resolve) ?Goal: Patient Will Transfer Sit To/From Stand ?Outcome: Completed/Met ?Goal: Pt Will Transfer Bed To Chair/Chair To Bed ?Outcome: Completed/Met ?Goal: Pt Will Ambulate ?Outcome: Completed/Met ?  ?

## 2021-09-22 ENCOUNTER — Telehealth: Payer: Self-pay

## 2021-09-22 ENCOUNTER — Telehealth: Payer: Medicare Other

## 2021-09-22 LAB — GLUCOSE, CAPILLARY: Glucose-Capillary: 221 mg/dL — ABNORMAL HIGH (ref 70–99)

## 2021-09-22 NOTE — Telephone Encounter (Addendum)
? ?  09/22/2021 ? ?Tasha [name] ?[birth year] ?[id number] ? ?RNCM spoke with patient and Darl Pikes Hardison(HPOA) at scheduled appointment-confirmed that [name] has transitioned her primary care provider to Dr. Brayton El at [facility]. RNCM encouraged her to contact PCP for questions, concerns and follow up. Patient's sees pulmonologist Dr. Luciano Cutter and will be referred to [facility] Care Management ? ?Kathyrn Sheriff, RN, MSN, BSN, CCM ?Care Management Coordinator ?[facility] [place] ?[phone]  ?

## 2021-09-22 NOTE — Telephone Encounter (Signed)
Tasha [name] has transitioned her primary care provider to Dr. Brayton El at [facility]. ?

## 2021-09-23 ENCOUNTER — Telehealth: Payer: Self-pay | Admitting: Pulmonary Disease

## 2021-09-23 NOTE — Discharge Summary (Signed)
?Physician Discharge Summary ?  ?Patient: Tasha CornfieldJanet Paisley [name] MRN: [id number] DOB: 02-14-1951  ?Admit date:     09/18/2021  ?Discharge date: 09/21/2021  ?Discharge Physician: Lynden Oxfordranav Jennene Downie  ?PCP: Pearline Cablesopland, [name], MD ? ?Recommendations at discharge: ?Follow-up with PCP in 1 week. ? ?Discharge Diagnoses: ?Principal Problem: ?  Acute respiratory failure with hypoxia and hypercapnia (HCC) ?Active Problems: ?  Essential hypertension ?  Hypercholesteremia ?  Anxiety and depression ?  Tobacco abuse ?  COPD with acute exacerbation (HCC) ?  Prediabetes ?  Secondary polycythemia ?  Acute respiratory failure with hypoxia (HCC) ? ? ?Hospital Course: ?Tasha [name] is an 71 y.o. female past medical history significant for abnormal nuclear stress test depression osteoarthritis of the lumbar spine asthma history of DVT not on anticoagulation, essential hypertension comes into the ED for shortness of breath that started the day prior to admission afebrile in the ED pulse of 90 breathing about 36 times per minute white count of 17 hemoglobin of 16 ABG showed pH of 7.3 4/52/75, SARS-CoV-2 and influenza PCR were negative, physical exam poor air movement with wheezing bilaterally ?Found to have COPD exacerbation. ? ?Assessment and Plan: ?Acute respiratory failure with hypoxia and hypercapnia secondary to COPD exacerbation: ?Required BiPAP initially. ?Still requiring 2 L of oxygen to keep saturations greater than 86%. ?Continue scheduled inhalers every 4 as needed. ?Continue steroids and antibiotic orally. ?Out of bed to chair, encourage incentive spirometry and ambulation. ?  ?Hyperglycemia/prediabetes mellitus: ?With an A1c of 6.0. ?Blood glucose well controlled continue current regimen. ?  ?Essential hypertension: ?Has remained stable continue diuretic therapy. ? ?Hyperlipidemia: ?Continue statins. ? ?Anxiety and depression: ?Continue citalopram, buspirone follow-up with psychiatry as an outpatient. ? ?Tobacco abuse: ?She  states she quit 2 days ago. ?Declined getting patch. ? ?Secondary polycythemia: ?Secondary to smoking she has been counseled. ?  ?Leukocytosis: ?Likely reactive due to steroids continue to monitor fever curve. ?Tmax of 98.7. ? ?Consultants: None ?Procedures performed:  ?None ?DISCHARGE MEDICATION: ?Allergies as of 09/21/2021   ? ?   Reactions  ? Acitretin Swelling, Rash  ? Pollen Extract Shortness Of Breath  ? Leaves, pt. reports  ? Percodan [oxycodone-aspirin]   ? Pt does not remember  ? Statins Other (See Comments)  ? Muscle aches in legs  ? ?  ? ?  ?Medication List  ?  ? ?STOP taking these medications   ? ?albuterol 108 (90 Base) MCG/ACT inhaler ?Commonly known as: VENTOLIN HFA ?Replaced by: albuterol (2.5 MG/3ML) 0.083% nebulizer solution ?You also have another medication with the same name that you need to continue taking as instructed. ?  ?ALPRAZolam 0.25 MG tablet ?Commonly known as: XANAX ?  ? ?  ? ?TAKE these medications   ? ?albuterol 108 (90 Base) MCG/ACT inhaler ?Commonly known as: VENTOLIN HFA ?Inhale 2 puffs into the lungs every 4 (four) hours as needed for wheezing or shortness of breath. ?What changed:  ?Another medication with the same name was added. Make sure you understand how and when to take each. ?Another medication with the same name was removed. Continue taking this medication, and follow the directions you see here. ?  ?albuterol (2.5 MG/3ML) 0.083% nebulizer solution ?Commonly known as: PROVENTIL ?Take 3 mLs (2.5 mg total) by nebulization every 4 (four) hours as needed for wheezing or shortness of breath. ?What changed: You were already taking a medication with the same name, and this prescription was added. Make sure you understand how and when to take each. ?Replaces: albuterol  108 (90 Base) MCG/ACT inhaler ?  ?aspirin EC 81 MG tablet ?Take 81 mg by mouth daily. Swallow whole. ?  ?busPIRone 15 MG tablet ?Commonly known as: BUSPAR ?Take 1 tablet (15 mg total) by mouth 2 (two) times  daily. ?What changed:  ?when to take this ?reasons to take this ?  ?escitalopram 10 MG tablet ?Commonly known as: LEXAPRO ?Take 1 tablet (10 mg total) by mouth daily. ?  ?furosemide 20 MG tablet ?Commonly known as: LASIX ?Take 1 tablet (20 mg total) by mouth daily. ?What changed:  ?how much to take ?when to take this ?  ?ipratropium-albuterol 0.5-2.5 (3) MG/3ML Soln ?Commonly known as: DUONEB ?Take 3 mLs by nebulization in the morning, at noon, and at bedtime for 7 days, THEN 3 mLs every 6 (six) hours as needed. INHALE VIA NEBULIZER EVERY 6 HOURS AS NEEDED. ?Start taking on: September 21, 2021 ?What changed: See the new instructions. ?  ?Multivitamin Adults 50+ Tabs ?Take 1 tablet by mouth daily. ?  ?predniSONE 10 MG tablet ?Commonly known as: DELTASONE ?Take 40mg  daily for 3days,Take 30mg  daily for 3days,Take 20mg  daily for 3days,Take 10mg  daily for 3days, then stop ?  ?simvastatin 10 MG tablet ?Commonly known as: ZOCOR ?Take 1 tablet (10 mg total) by mouth daily. ?What changed: when to take this ?  ?Trelegy Ellipta 200-62.5-25 MCG/ACT Aepb ?Generic drug: Fluticasone-Umeclidin-Vilant ?Inhale 1 puff into the lungs daily. ?  ? ?  ? ? Follow-up Information   ? ? Copland, , MD. Schedule an appointment as soon as possible for a visit in 1 week(s).   ?Specialty: Family Medicine ?Contact information: ?[address] ?[address] ?[place] [address] ?[phone] ? ? ?  ?  ? ? Care, [facility] Follow up.   ?Why: 2631 is the main contact she will reach out to you to set up the first visit.  Her phone number is [phone]. ?Contact information: ?[address] ?[place] [phone] Becky Sax ?[phone] ? ? ?  ?  ? ?  ?  ? ?  ? ?Disposition: Home ?Diet recommendation: Cardiac diet ? ?Discharge Exam: ?Vitals:  ? 09/21/21 0524 09/21/21 0801 09/21/21 1415 09/21/21 1458  ?BP: 121/66   119/76  ?Pulse: 73   96  ?Resp: 16   20  ?Temp: 97.7 ?F (36.5 ?C)   98.9 ?F (37.2 ?C)  ?TempSrc: Oral   Oral   ?SpO2: 90% 90% 90% 91%  ?Weight:      ?Height:      ? ?General: Appear in mild distress; no visible Abnormal Neck Mass Or lumps, Conjunctiva normal ?Cardiovascular: S1 and S2 Present, no Murmur, ?Respiratory: good respiratory effort, Bilateral Air entry present and no Crackles, expiratory  wheezes ?Abdomen: Bowel Sound present, Non tender  ?Extremities: no Pedal edema ?Neurology: alert and oriented to time, place, and person ?Gait not checked due to patient safety concerns ?Filed Weights  ? 09/18/21 1229 09/21/21 0500  ?Weight: 58.1 kg 57.7 kg  ? ?Condition at discharge: stable ? ?The results of significant diagnostics from this hospitalization (including imaging, microbiology, ancillary and laboratory) are listed below for reference.  ? ?Imaging Studies: ?DG Chest Port 1 View ? ?Result Date: 09/18/2021 ?CLINICAL DATA:  71 year old female with history of shortness of breath. EXAM: PORTABLE CHEST 1 VIEW COMPARISON:  Chest x-ray 07/14/2021. FINDINGS: Lung volumes are normal. No consolidative airspace disease. No pleural effusions. No pneumothorax. No pulmonary nodule or mass noted. Pulmonary vasculature and the cardiomediastinal silhouette are within normal  limits. IMPRESSION: No radiographic evidence of acute cardiopulmonary disease. Electronically Signed   By: Trudie Reed M.D.   On: 09/18/2021 07:22   ? ?Microbiology: ?Results for orders placed or performed during the hospital encounter of 09/18/21  ?Resp Panel by RT-PCR (Flu A&B, Covid) Nasopharyngeal Swab     Status: None  ? Collection Time: 09/18/21  8:42 AM  ? Specimen: Nasopharyngeal Swab; Nasopharyngeal(NP) swabs in vial transport medium  ?Result Value Ref Range Status  ? SARS Coronavirus 2 by RT PCR NEGATIVE NEGATIVE Final  ?  Comment: (NOTE) ?SARS-CoV-2 target nucleic acids are NOT DETECTED. ? ?The SARS-CoV-2 RNA is generally detectable in upper respiratory ?specimens during the acute phase of infection. The lowest ?concentration of SARS-CoV-2 viral  copies this assay can detect is ?138 copies/mL. A negative result does not preclude SARS-Cov-2 ?infection and should not be used as the sole basis for treatment or ?other patient management decisions. A negative result m

## 2021-09-23 NOTE — Telephone Encounter (Signed)
[facility].  No answer, LVM to return call. ? ?Called [phone], spoke with Manuela Schwartz [facility]) regarding anxiety medication.  She states that the patient was prescribed Alprazolam 0.25 mg while she was in the hospital, she was taking 1/2 a tab at HS and 1/2 a tab prn anxiety.  She went to rehab and had been on this medication since 07/2021.  I verified that she is still currently taking Buspar and Lexapro.  I advised her that I did not see this medication on her med list when she saw Dr. Loanne Drilling on 08/13/2021.  She was hospitalized again on 4/21 and discharged on 4/24 and at this time the alprazolam was discontinued.  I let her know that the mediation was not prescribed by Dr. Loanne Drilling and generally this is not a medication that is prescribed by our office.  I advised that they would need to discuss this with her PCP, Dr. Keith Rake or follow up with a psychiatrist to prescribe.  She verbalized understanding and will discuss with PCP as they were in his office at the time of my call.  I verified with her that she does have a f/u appointment with Dr. Loanne Drilling on 10/28/21 at 10 am.  Nothing further needed. ? ? ?

## 2021-09-23 NOTE — Patient Outreach (Signed)
Received a referral from Thea Silversmith, RN Case Manager for [facility]. ?I have assigned Valente David, RN to call for follow up and determine if there are any Case Management needs.  ?  ?Arville Care, CBCS, CMAA ?Welcome Management Assistant ?[facility] ?[phone]   ?

## 2021-09-24 ENCOUNTER — Other Ambulatory Visit: Payer: Self-pay | Admitting: *Deleted

## 2021-09-24 NOTE — Patient Outreach (Signed)
[facility]) Care Management ? ?09/24/2021 ? ?Tasha [name] ?Jan 15, 1951 ?IS:[id number] ? ? ?Referral Date: 4/27 ?Referral Source: Post Acute care Coordinator/Embedded care manager ?Referral Reason: Recent SNF/Rehab discharge and recent hospital discharge ?Insurance: Traditional Medicare ? ? ?Outreach attempt #1, successful to confirmed number for member by post acute care coordinator [phone]).  Identity verified.  This care manager introduced self and stated purpose of call.  [facility] care management services explained.  She declines to participate.  Benefits explained, discussed member's contact with post acute care coordinator while she was in SNF for rehab.  She remember's conversation but again state she is "ok."  Requested to review [facility] brochure and consider participation prior to making final decision, she agrees.  ? ?Plan: ?RN CM will send outreach letter and follow up with member and POA within the next 7 business days. ? ?Valente David, RN, MSN, CCM ?[facility] Care Management  ?Community Care Manager ?[phone] ? ?

## 2021-10-05 ENCOUNTER — Other Ambulatory Visit: Payer: Self-pay | Admitting: *Deleted

## 2021-10-05 NOTE — Patient Outreach (Addendum)
[facility]) Care Management ? ?10/05/2021 ? ?Foy Mungia Empson ?[birth year] ?[id number] ? ? ?Call placed to member to follow up on decision for [facility] participation.  Confirms she received information about program in mail, but asks to have call back tomorrow for assessment.   ? ? ? ?Update: ? ?Incoming call received from [facility] POA.  State member told her that she had visit with this RNCM today.  Member's identity verified.  This care manager introduced self and stated purpose of call.  [facility] care management services explained.  Advised this care manager did call to offer services but she asked to have call back tomorrow. ? ?POA, Darl Pikes, explains that member lives alone but in the past year has started showing signs of confusion and/or dementia.  This has not been diagnosed but she would like member to be evaluated further for it.  She was recently discharged from SNF, evaluated by [facility] for home health PTOT but was told she didn't need it.  It was however recommended for her to have SLP services.  Per Barbie Banner is in the process of getting this approved by PCP office.  Member also has [facility] involved with palliative care, next home visit scheduled for 5/18.  Darl Pikes is concerned as she will be out of the country for the next couple weeks, has hired a Lawyer, paying privately, to go by daily to check on member and make sure she is taking medications, including nebulizers.  Per Darl Pikes, member forgets to take meds.  She has discussed ALF with member but she refuses to consider.   ? ?Bernerd Limbo that member did agree to services but asked to wait until tomorrow to start assessment.  If unable to complete assessment, will follow up with Darl Pikes upon her return to complete.   ? ? ?Kemper Durie, RN, MSN, CCM ?[facility] Care Management  ?Community Care Manager ?[phone] ? ?

## 2021-10-06 ENCOUNTER — Other Ambulatory Visit: Payer: Self-pay | Admitting: *Deleted

## 2021-10-06 ENCOUNTER — Encounter: Payer: Self-pay | Admitting: *Deleted

## 2021-10-06 NOTE — Patient Outreach (Signed)
?[facility]) Care Management ?Telephonic RN Care Manager Note ? ? ?10/06/2021 ?Name:  Tasha [name] MRN:  [id number] DOB:  Feb 26, 1951 ? ?Summary: ? ?Social: Lives alone, but is independent.  Orion Modest, and Lennox Laity, CNA, both present with member during assessment.  Member does not cook but state she eats out daily.  Offered healthier options as meal deliveries, she declines.  Monitors oxygen levels daily, goal to keep greater then 90%. ? ?Conditions: Per chart, history of COPD, HTN, GERD, HLD, and anxiety.  Darl Pikes report some memory issues but dementia has not been diagnosed.  She will discuss further with PCP during next visit.  ? ?Medications: Reviewed with Darl Pikes and member.  Darl Pikes and CNA that will be with member for the next couple weeks will help member remember to take as instructed.  ? ?Appointments: Follow up with PCP scheduled for 5/22 and with pulmonary on 5/31.  Darl Pikes will provide transportation.  ? ?Consent: Member and POA agrees to services. ? ? ?Subjective: ?Tasha [name] is an 71 y.o. year old female who is a primary patient of Copland, Gwenlyn Found, MD. The care management team was consulted for assistance with care management and/or care coordination needs.   ? ?Telephonic RN Care Manager completed Telephone Visit today. ? ?Objective:  ? ?Medications Reviewed Today   ? ? Reviewed by Kemper Durie, RN (Registered Nurse) on 10/06/21 at 1135  Med List Status: <None>  ? ?Medication Order Taking? Sig Documenting Provider Last Dose Status Informant  ?albuterol (PROVENTIL) (2.5 MG/3ML) 0.083% nebulizer solution [id number]  Take 3 mLs (2.5 mg total) by nebulization every 4 (four) hours as needed for wheezing or shortness of breath. Rolly Salter, MD  Active   ?albuterol (VENTOLIN HFA) 108 (90 Base) MCG/ACT inhaler [id number]  Inhale 2 puffs into the lungs every 4 (four) hours as needed for wheezing or shortness of breath. Ollen Bowl, MD  Expired 09/18/21 2359 Self, Friend   ?aspirin EC 81 MG tablet [id number]  Take 81 mg by mouth daily. Swallow whole. [provider]  Active Self, Friend  ?busPIRone (BUSPAR) 15 MG tablet [id number]  Take 1 tablet (15 mg total) by mouth 2 (two) times daily. Rolly Salter, MD  Active   ?escitalopram (LEXAPRO) 10 MG tablet [id number]  Take 1 tablet (10 mg total) by mouth daily. Copland, Gwenlyn Found, MD  Active Self, Friend  ?Fluticasone-Umeclidin-Vilant (TRELEGY ELLIPTA) 200-62.5-25 MCG/ACT AEPB [id number]  Inhale 1 puff into the lungs daily. Luciano Cutter, MD  Active Self, Friend  ?furosemide (LASIX) 20 MG tablet [id number]  Take 1 tablet (20 mg total) by mouth daily.  ?Patient taking differently: Take 10 mg by mouth every Monday, Wednesday, and Friday.  ? Zigmund Daniel., MD  Expired 09/18/21 2359 Self, Friend  ?ipratropium-albuterol (DUONEB) 0.5-2.5 (3) MG/3ML SOLN [id number]  Take 3 mLs by nebulization in the morning, at noon, and at bedtime for 7 days, THEN 3 mLs every 6 (six) hours as needed. INHALE VIA NEBULIZER EVERY 6 HOURS AS NEEDED. Rolly Salter, MD  Active   ?Multiple Vitamins-Minerals (MULTIVITAMIN ADULTS 50+) TABS [id number]  Take 1 tablet by mouth daily. [provider]  Active Self, Friend  ?predniSONE (DELTASONE) 10 MG tablet [id number]  Take 40mg  daily for 3days,Take 30mg  daily for 3days,Take 20mg  daily for 3days,Take 10mg  daily for 3days, then stop , MD  Active   ?simvastatin (ZOCOR) 10 MG tablet  Take 1 tablet (10 mg total) by mouth  daily.  ?Patient taking differently: Take 10 mg by mouth every evening.  ? Copland, Gwenlyn Found, MD  Active Self, Friend  ? ?  ?  ? ?  ? ? ? ?SDOH:  (Social Determinants of Health) assessments and interventions performed:  ? ? ? ?Care Plan ? ?Review of patient past medical history, allergies, medications, health status, including review of consultants reports, laboratory and other test data, was performed as part of comprehensive evaluation for care  management services.  ? ?Care Plan : [facility] Plan of Care (Adult)  ?Updates made by Kemper Durie, RN since 10/06/2021 12:00 AM  ?  ? ?Problem: Care Coordination needs and education related to management of chronic condition, COPD   ?Priority: High  ?  ? ?Long-Range Goal: Member/caregiver will adequately manage member's chronic conditon (COPD) and decrease risk of hospital admissions   ?Start Date: 10/06/2021  ?Priority: High  ?Note:   ?Current Barriers:  ?Care Coordination needs related to Memory Deficits ?Chronic Disease Management support and education needs related to COPD  ? ?RNCM Clinical Goal(s):  ?Patient will verbalize understanding of plan for management of COPD as evidenced by no readmissions ?take all medications exactly as prescribed and will call provider for medication related questions as evidenced by Caregiver reported compliance ?attend all scheduled medical appointments: PCP and Pulmonary as evidenced by Caregiver reported attendance and Epic calendar ?continue to work with RN Care Manager to address care management and care coordination needs related to  COPD as evidenced by adherence to CM Team Scheduled appointments ?work with palliative care and home health to promote optimal health as evidenced by decreased admissions  through collaboration with RN Care manager, provider, and care team.  ? ?Interventions: ?Inter-disciplinary care team collaboration (see longitudinal plan of care) ?Evaluation of current treatment plan related to  self management and patient's adherence to plan as established by provider ? ? ?COPD Interventions:  (Status:  New goal.) Long Term Goal ?Provided patient with basic written and verbal COPD education on self care/management/and exacerbation prevention ?Advised patient to self assesses COPD action plan zone and make appointment with provider if in the yellow zone for 48 hours without improvement ?Discussed the importance of adequate rest and management of fatigue with  COPD ? ?Patient Goals/Self-Care Activities: ?Take all medications as prescribed ?Attend all scheduled provider appointments ?arrange respite care for caregiver ?develop a rescue plan ?eliminate symptom triggers at home ? ?Follow Up Plan:  The patient has been provided with contact information for the care management team and has been advised to call with any health related questions or concerns.  ? ?  ? ? ? ?Plan:  Telephone follow up appointment with care management team member scheduled for:  2 weeks. ?The patient has been provided with contact information for the care management team and has been advised to call with any health related questions or concerns.  ? ?Kemper Durie, RN, MSN, CCM ?[facility] Care Management  ?Community Care Manager ?[phone] ? ? ? ?

## 2021-10-21 ENCOUNTER — Other Ambulatory Visit: Payer: Self-pay | Admitting: *Deleted

## 2021-10-21 NOTE — Patient Outreach (Signed)
[facility]) Care Management Telephonic RN Care Manager Note   10/21/2021 Name:  Tasha [name] MRN:  [id number] DOB:  01/04/1951  Summary: Tasha [name] call placed to member's POA, Tasha [name], successful.  Denies any urgent concerns, encouraged to contact this care manager with questions.     Subjective: Tasha [name] is an 71 y.o. year old female who is a primary patient of Tasha Hack, MD. The care management team was consulted for assistance with care management and/or care coordination needs.    Telephonic RN Care Manager completed Telephone Visit today.  Objective:   Medications Reviewed Today     Reviewed by Tasha Durie, RN (Registered Nurse) on 10/06/21 at 1135  Med List Status: <None>   Medication Order Taking? Sig Documenting Provider Last Dose Status Informant  albuterol (PROVENTIL) (2.5 MG/3ML) 0.083% nebulizer solution [id number]  Take 3 mLs (2.5 mg total) by nebulization every 4 (four) hours as needed for wheezing or shortness of breath. Tasha Salter, MD  Active   albuterol (VENTOLIN HFA) 108 (90 Base) MCG/ACT inhaler [id number]  Inhale 2 puffs into the lungs every 4 (four) hours as needed for wheezing or shortness of breath. Ollen Bowl, MD  Expired 09/18/21 2359 Self, Friend  aspirin EC 81 MG tablet [id number]  Take 81 mg by mouth daily. Swallow whole. [provider]  Active Self, Friend  busPIRone (BUSPAR) 15 MG tablet [id number]  Take 1 tablet (15 mg total) by mouth 2 (two) times daily. Tasha Salter, MD  Active   escitalopram (LEXAPRO) 10 MG tablet [id number]  Take 1 tablet (10 mg total) by mouth daily. Copland, Gwenlyn Found, MD  Active Self, Friend  Fluticasone-Umeclidin-Vilant (TRELEGY ELLIPTA) 200-62.5-25 MCG/ACT AEPB [id number]  Inhale 1 puff into the lungs daily. Luciano Cutter, MD  Active Self, Friend  furosemide (LASIX) 20 MG tablet [id number]  Take 1 tablet (20 mg total) by mouth daily.  Patient taking differently: Take  10 mg by mouth every Monday, Wednesday, and Friday.   Zigmund Daniel., MD  Expired 09/18/21 2359 Self, Friend  ipratropium-albuterol (DUONEB) 0.5-2.5 (3) MG/3ML SOLN [id number]  Take 3 mLs by nebulization in the morning, at noon, and at bedtime for 7 days, THEN 3 mLs every 6 (six) hours as needed. INHALE VIA NEBULIZER EVERY 6 HOURS AS NEEDED. Tasha Salter, MD  Active   Multiple Vitamins-Minerals (MULTIVITAMIN ADULTS 50+) TABS [id number]  Take 1 tablet by mouth daily. [provider]  Active Self, Friend  predniSONE (DELTASONE) 10 MG tablet [id number]  Take 40mg  daily for 3days,Take 30mg  daily for 3days,Take 20mg  daily for 3days,Take 10mg  daily for 3days, then stop , MD  Active   simvastatin (ZOCOR) 10 MG tablet  Take 1 tablet (10 mg total) by mouth daily.  Patient taking differently: Take 10 mg by mouth every evening.   Copland, , MD  Active Self, Friend             SDOH:  (Social Determinants of Health) assessments and interventions performed:     Care Plan  Review of patient past medical history, allergies, medications, health status, including review of consultants reports, laboratory and other test data, was performed as part of comprehensive evaluation for care management services.   Care Plan : [facility] Plan of Care (Adult)  Updates made by Tasha Salter, RN since 10/21/2021 12:00 AM     Problem: Care Coordination needs and education related to management of chronic condition,  COPD   Priority: High     Long-Range Goal: Member/caregiver will adequately manage member's chronic conditon (COPD) and decrease risk of hospital admissions Completed 10/21/2021  Start Date: 10/06/2021  This Visit's Progress: Not on track  Priority: High  Note:   Current Barriers:  Care Coordination needs related to Memory Deficits Chronic Disease Management support and education needs related to COPD   RNCM Clinical Goal(s):  Patient will verbalize  understanding of plan for management of COPD as evidenced by no readmissions take all medications exactly as prescribed and will call provider for medication related questions as evidenced by Caregiver reported compliance attend all scheduled medical appointments: PCP and Pulmonary as evidenced by Caregiver reported attendance and Epic calendar continue to work with RN Care Manager to address care management and care coordination needs related to  COPD as evidenced by adherence to CM Team Scheduled appointments work with palliative care and home health to promote optimal health as evidenced by decreased admissions  through collaboration with RN Care manager, provider, and care team.   Interventions: Inter-disciplinary care team collaboration (see longitudinal plan of care) Evaluation of current treatment plan related to  self management and patient's adherence to plan as established by provider   COPD Interventions:  (Status:  Goal on track:  NO.) Long Term Goal Provided patient with basic written and verbal COPD education on self care/management/and exacerbation prevention Advised patient to self assesses COPD action plan zone and make appointment with provider if in the yellow zone for 48 hours without improvement Discussed the importance of adequate rest and management of fatigue with COPD  Patient Goals/Self-Care Activities: Take all medications as prescribed Attend all scheduled provider appointments arrange respite care for caregiver develop a rescue plan eliminate symptom triggers at home  Follow Up Plan:  The patient has been provided with contact information for the care management team and has been advised to call with any health related questions or concerns.    Update 5/24 - Per POA, Tasha [name], member has continued to have problems with COPD.  She has now transitioned from palliative care to hospice.  Case closed due to external program.      Plan:  No further follow up  required: Case closed, member now active with hospice program through [facility].  Tasha Durie, RN, MSN, CCM [facility] Care Management  [facility] Manager [phone]

## 2021-10-28 ENCOUNTER — Ambulatory Visit: Payer: Medicare Other | Admitting: Pulmonary Disease

## 2021-10-28 NOTE — Progress Notes (Deleted)
Subjective:   PATIENT ID: Tasha [name] GENDER: female DOB: 08-22-1950, MRN: [id number]   HPI  No chief complaint on file.   Reason for Visit: Follow-up  [name] is a 71 year old female active smoker with COPD, acute on chronic hypoxemic and hypercapnic respiratory failure, prior DVT hypertension, psoriasiform dermatitis and anxiety/depression who presents for follow-up  Initial consult She was recently hospitalized from 2/14 to 07/24/2021 for COPD exacerbation. She was treated with steroids and nebulizers with improvement and discharged with outpatient pulmonary follow-up.  On discharge she required 2 L O2 at rest and 4 L with activity. Of note she was previously seen by Dr. Vaughan Browner at [facility] pulmonary in 2019 however was lost to follow-up.  Note was reviewed and she was previously on Trelegy.  In 2019 she had multiple COPD exacerbations.  She is on Primatene 2-3 times a day. She continues to have shortness of breath and wheezing. She is compliant with her oxygen. Denies nocturnal symptoms. Currently residing at [facility]. Quit smoking after her hospitalization. Able to ambulate with walker. Does not require assistance with ADLs.  10/28/21 Since her last visit she was hospitalized from 4/21-4/24 for COPD exacerbation.  Discharge note was reviewed.  She required BiPAP and was discharged on 2 L O2  Social History: 1 PPD x50 years Quit smoking in 07/2021  Past Medical History:  Diagnosis Date   Abnormal nuclear stress test 06/10/2015   Allergy    Anxiety    Arthritis    DDD lumbar; B thumbs   Asthma    age 58   Clotting disorder ([place])    Depression    DVT (deep venous thrombosis) (HCC)    GERD (gastroesophageal reflux disease)    Hyperlipidemia    Hypertension    Psoriasiform dermatitis      Family History  Problem Relation Age of Onset   Cancer Mother 78       colorectal cancer   Stroke Mother 5       CVA   Alcohol abuse Mother    Diabetes Mother     Hypertension Mother    Heart disease Father    Cancer Maternal Grandfather        lung   Alcohol abuse Brother    Heart attack Brother    Cancer Maternal Aunt        breast   Breast cancer Maternal Aunt 75     Social History   Occupational History   Not on file  Tobacco Use   Smoking status: Every Day    Packs/day: 0.50    Years: 46.00    Pack years: 23.00    Types: Cigarettes   Smokeless tobacco: Never   Tobacco comments:    08/13/21 - stopped smoking 07/27/21  Vaping Use   Vaping Use: Never used  Substance and Sexual Activity   Alcohol use: No   Drug use: No   Sexual activity: Not on file    Allergies  Allergen Reactions   Acitretin Swelling and Rash   Pollen Extract Shortness Of Breath    Leaves, pt. reports   Percodan [Oxycodone-Aspirin]     Pt does not remember   Statins Other (See Comments)    Muscle aches in legs      Outpatient Medications Prior to Visit  Medication Sig Dispense Refill   albuterol (PROVENTIL) (2.5 MG/3ML) 0.083% nebulizer solution Take 3 mLs (2.5 mg total) by nebulization every 4 (four) hours as needed for wheezing or shortness  of breath. 75 mL 2   albuterol (VENTOLIN HFA) 108 (90 Base) MCG/ACT inhaler Inhale 2 puffs into the lungs every 4 (four) hours as needed for wheezing or shortness of breath. 8 g 0   aspirin EC 81 MG tablet Take 81 mg by mouth daily. Swallow whole.     busPIRone (BUSPAR) 15 MG tablet Take 1 tablet (15 mg total) by mouth 2 (two) times daily. 180 tablet 3   escitalopram (LEXAPRO) 10 MG tablet Take 1 tablet (10 mg total) by mouth daily. 30 tablet 11   Fluticasone-Umeclidin-Vilant (TRELEGY ELLIPTA) 200-62.5-25 MCG/ACT AEPB Inhale 1 puff into the lungs daily. 60 each 6   furosemide (LASIX) 20 MG tablet Take 1 tablet (20 mg total) by mouth daily. (Patient taking differently: Take 10 mg by mouth every Monday, Wednesday, and Friday.) 30 tablet 0   ipratropium-albuterol (DUONEB) 0.5-2.5 (3) MG/3ML SOLN Take 3 mLs by  nebulization in the morning, at noon, and at bedtime for 7 days, THEN 3 mLs every 6 (six) hours as needed. INHALE 3MLS VIA NEBULIZER EVERY 6 HOURS AS NEEDED. 90 mL 0   Multiple Vitamins-Minerals (MULTIVITAMIN ADULTS 50+) TABS Take 1 tablet by mouth daily.     predniSONE (DELTASONE) 10 MG tablet Take 40mg  daily for 3days,Take 30mg  daily for 3days,Take 20mg  daily for 3days,Take 10mg  daily for 3days, then stop (Patient not taking: Reported on 10/06/2021) 30 tablet 0   simvastatin (ZOCOR) 10 MG tablet Take 1 tablet (10 mg total) by mouth daily. (Patient taking differently: Take 10 mg by mouth every evening.) 30 tablet 0   No facility-administered medications prior to visit.    ROS   Objective:   There were no vitals filed for this visit.     Physical Exam: General: Frail and chronically ill-appearing, no acute distress HENT: [place], AT Eyes: EOMI, no scleral icterus Respiratory: Diminished breath sounds bilaterally.  No crackles, wheezing or rales Cardiovascular: RRR, -M/R/G, no JVD Extremities:-Edema,-tenderness Neuro: AAO x4, CNII-XII grossly intact Psych: Normal mood, normal affect   Data Reviewed:  Imaging: CT chest 07/20/2021-apical centrilobular emphysema, mild diffuse bronchial thickening and scattered mucus plugging.  Subsegmental atelectasis and scarring in left upper lobe  PFT: 04/07/2018 FVC 1.98 (77%) FEV1 1.42 (73%) ratio 55 TLC 115% RV 158% DLCO 72% Interpretation mild obstructive defect with significant bronchodilator response in FEV1 and FVC.  Air-trapping and reduced gas exchange also associated which is consistent with diagnosis of emphysema.  Cardiac: Echo 07/22/2021-EF 65 to 70%.  No WMA.  Grade 1 DD.  RV size and function normal.  No valvular abnormalities.  Labs: CBC    Component Value Date/Time   WBC 21.8 (H) 09/19/2021 0243   RBC 4.49 09/19/2021 0243   HGB 14.5 09/19/2021 0243   HCT 42.4 09/19/2021 0243   PLT 352 09/19/2021 0243   MCV 94.4 09/19/2021 0243    MCV 93.7 07/14/2014 1520   MCH 32.3 09/19/2021 0243   MCHC 34.2 09/19/2021 0243   RDW 14.7 09/19/2021 0243   LYMPHSABS 1.2 09/18/2021 0750   MONOABS 0.9 09/18/2021 0750   EOSABS 0.1 09/18/2021 0750   BASOSABS 0.0 09/18/2021 0750   Absolute eos 07/23/2021-0  ABG    Component Value Date/Time   PHART 7.37 09/18/2021 0700   PCO2ART 52 (H) 09/18/2021 0700   PO2ART 75 (L) 09/18/2021 0700   HCO3 30.1 (H) 09/18/2021 0700   ACIDBASEDEF 0.8 12/10/2017 2322   O2SAT 97.2 09/18/2021 0700   Normal A1AT - MM Assessment & Plan:   Discussion:  71 year old female active smoker with COPD, acute on chronic hypoxemic and hypercapnic respiratory failure, prior DVT, hypertension, psoriasiform dermatitis and anxiety/depression who presents for follow-up COPD.  She has been hospitalized in February and April 2023.  Counseled against the use of epinephrine HFA due to side effects including tachycardia and hypertension.  COPD with emphysema Acute on chronic hypoxemic and hypercapneic respiratory failure --CONTINUE 2L at rest and 4L activity/sleep. Goal SpO2 >88% --START Trelegy ONE puff ONCE a day --CONTINUE Albuterol AS NEEDED for shortness of breath --Albuterol nebulizer meds ordered. Please let us know if you need a new machine --Plan for ambulatory O2 at next visit  Health Maintenance Immunization History  Administered Date(s) Administered   Fluad Quad(high Dose 65+) 03/10/2019   Influenza Split 04/10/2012   Influenza Whole 04/17/2008, 04/29/2009   Influenza, High Dose Seasonal PF 04/08/2016, 03/31/2017, 02/01/2018   Influenza,inj,Quad PF,6+ Mos 05/07/2014, 04/14/2015   PFIZER(Purple Top)SARS-COV-2 Vaccination 07/26/2019, 08/21/2019, 03/31/2020   Pneumococcal Conjugate-13 02/01/2018   Pneumococcal Polysaccharide-23 04/17/2008, 04/29/2009   Td 06/04/2002, 10/23/2014   Zoster, Live 05/31/2014   CT Lung Screen- In the future will arrange lung cancer screening program in 07/2022  No orders of  the defined types were placed in this encounter.  No orders of the defined types were placed in this encounter.   No follow-ups on file.  I have spent a total time of 45-minutes on the day of the appointment reviewing prior documentation, coordinating care and discussing medical diagnosis and plan with the patient/family. Imaging, labs and tests included in this note have been reviewed and interpreted independently by me.  [place], MD [facility] 10/28/2021 8:17 AM  Office Number [phone]

## 2021-12-29 DEATH — deceased

## 2022-03-01 ENCOUNTER — Other Ambulatory Visit: Payer: Medicare Other
